# Patient Record
Sex: Male | Born: 1935 | Race: White | Hispanic: No | Smoking: Never smoker
Health system: Southern US, Community
[De-identification: ages and names within clinical notes are randomized; demographics above are authoritative.]

## PROBLEM LIST (undated history)

## (undated) DIAGNOSIS — F0280 Dementia in other diseases classified elsewhere without behavioral disturbance: Secondary | ICD-10-CM

## (undated) DIAGNOSIS — I480 Paroxysmal atrial fibrillation: Secondary | ICD-10-CM

## (undated) DIAGNOSIS — R269 Unspecified abnormalities of gait and mobility: Secondary | ICD-10-CM

## (undated) DIAGNOSIS — E7800 Pure hypercholesterolemia, unspecified: Secondary | ICD-10-CM

## (undated) DIAGNOSIS — I10 Essential (primary) hypertension: Secondary | ICD-10-CM

## (undated) DIAGNOSIS — Z7901 Long term (current) use of anticoagulants: Secondary | ICD-10-CM

## (undated) DIAGNOSIS — K922 Gastrointestinal hemorrhage, unspecified: Secondary | ICD-10-CM

## (undated) DIAGNOSIS — N400 Enlarged prostate without lower urinary tract symptoms: Secondary | ICD-10-CM

## (undated) DIAGNOSIS — F0390 Unspecified dementia without behavioral disturbance: Secondary | ICD-10-CM

## (undated) DIAGNOSIS — R413 Other amnesia: Secondary | ICD-10-CM

## (undated) HISTORY — DX: Pure hypercholesterolemia, unspecified: E78.00

## (undated) HISTORY — DX: Dementia in other diseases classified elsewhere, unspecified severity, without behavioral disturbance, psychotic disturbance, mood disturbance, and anxiety: F02.80

## (undated) HISTORY — PX: TOTAL HIP ARTHROPLASTY: SHX124

## (undated) HISTORY — DX: Unspecified dementia, unspecified severity, without behavioral disturbance, psychotic disturbance, mood disturbance, and anxiety: F03.90

## (undated) HISTORY — DX: Unspecified abnormalities of gait and mobility: R26.9

## (undated) HISTORY — DX: Essential (primary) hypertension: I10

## (undated) HISTORY — PX: CHOLECYSTECTOMY: SHX55

## (undated) HISTORY — DX: Gastrointestinal hemorrhage, unspecified: K92.2

---
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
## 2020-01-16 ENCOUNTER — Encounter: Payer: Self-pay | Admitting: *Deleted

## 2020-01-17 ENCOUNTER — Encounter: Payer: Self-pay | Admitting: Diagnostic Neuroimaging

## 2020-01-17 ENCOUNTER — Ambulatory Visit (INDEPENDENT_AMBULATORY_CARE_PROVIDER_SITE_OTHER): Payer: Medicare Other | Admitting: Diagnostic Neuroimaging

## 2020-01-17 ENCOUNTER — Other Ambulatory Visit: Payer: Self-pay

## 2020-01-17 VITALS — BP 174/92 | HR 75 | Ht 71.0 in | Wt 175.0 lb

## 2020-01-17 DIAGNOSIS — F03B18 Unspecified dementia, moderate, with other behavioral disturbance: Secondary | ICD-10-CM

## 2020-01-17 DIAGNOSIS — F0391 Unspecified dementia with behavioral disturbance: Secondary | ICD-10-CM

## 2020-01-17 NOTE — Patient Instructions (Signed)
MODERATE-SEVERE DEMENTIA - safety / supervision issues reviewed - daily physical activity / exercise (at least 15-30 minutes) - eat more plants / vegetables - increase social activities, brain stimulation, games, puzzles, hobbies, crafts, arts, music - aim for at least 7-8 hours sleep per night (or more) - avoid smoking and alcohol - caregiver resources provided - no driving; cannot handle finances or medications 

## 2020-01-17 NOTE — Progress Notes (Signed)
[facility]  PATIENT: Ryan [name] DOB: 1934-06-05  REFERRING CLINICIAN: [name], MD HISTORY FROM: patient  REASON FOR VISIT: new consult    HISTORICAL  CHIEF COMPLAINT:  Chief Complaint  Patient presents with  . Alzheimer's disease    rm 7 New Pt dgtr- Barbara    HISTORY OF PRESENT ILLNESS:   85 year old male here for evaluation of dementia.  Patient is accompanied by his daughters.  Patient lives in [place] but visits [place] to spend time in the winters with daughters.  He was diagnosed with dementia in 2015.  He got married in 2017.  In 2018 he was assigned a guardian by the state.  Patient has very limited insight.  He is calm and pleasant during our evaluation.  ADLs significantly limited.  Needs help with bathing, dressing, toileting, transferring continence.  He is able to feed himself.  Not able to function independently outside at home.  Cannot manage finances.  Not able to drive.  Cannot manage medications.    REVIEW OF SYSTEMS: Full 14 system review of systems performed and negative with exception of: As per HPI.  ALLERGIES: No Known Allergies  HOME MEDICATIONS: Outpatient Medications Prior to Visit  Medication Sig Dispense Refill  . apixaban (ELIQUIS) 5 MG TABS tablet Take 5 mg by mouth 2 (two) times daily.    Marland Kitchen atorvastatin (LIPITOR) 40 MG tablet Take 40 mg by mouth daily.    . Multiple Vitamin (MULTIVITAMIN) tablet Take 1 tablet by mouth daily.    . tamsulosin (FLOMAX) 0.4 MG CAPS capsule Take 0.4 mg by mouth.    . IRON CR PO Take 90 mg by mouth. (Patient not taking: Reported on 01/17/2020)    . Probiotic Product (PROBIOTIC-10 PO) Take by mouth. (Patient not taking: Reported on 01/17/2020)     No facility-administered medications prior to visit.    PAST MEDICAL HISTORY: Past Medical History:  Diagnosis Date  . Alzheimer disease (HCC)   . BPH (benign prostatic hyperplasia)   . Gait disorder   . Hypercholesterolemia   .  Hypertension   . Memory loss     PAST SURGICAL HISTORY: Past Surgical History:  Procedure Laterality Date  . CHOLECYSTECTOMY    . TOTAL HIP ARTHROPLASTY      FAMILY HISTORY: No family history on file.  SOCIAL HISTORY: Social History   Socioeconomic History  . Marital status: Married    Spouse name: Not on file  . Number of children: 3  . Years of education: Not on file  . Highest education level: High school graduate  Occupational History    Comment: retired  Tobacco Use  . Smoking status: Never Smoker  . Smokeless tobacco: Never Used  . Tobacco comment: maybe in 20's  Substance and Sexual Activity  . Alcohol use: Not Currently  . Drug use: Never  . Sexual activity: Not on file  Other Topics Concern  . Not on file  Social History Narrative   01/17/20 lives with wife in home   Social Determinants of Health   Financial Resource Strain:   . Difficulty of Paying Living Expenses: Not on file  Food Insecurity:   . Worried About Programme researcher, broadcasting/film/video in the Last Year: Not on file  . Ran Out of Food in the Last Year: Not on file  Transportation Needs:   . Lack of Transportation (Medical): Not on file  . Lack of Transportation (Non-Medical): Not on file  Physical Activity:   . Days of  Exercise per Week: Not on file  . Minutes of Exercise per Session: Not on file  Stress:   . Feeling of Stress : Not on file  Social Connections:   . Frequency of Communication with Friends and Family: Not on file  . Frequency of Social Gatherings with Friends and Family: Not on file  . Attends Religious Services: Not on file  . Active Member of Clubs or Organizations: Not on file  . Attends Banker Meetings: Not on file  . Marital Status: Not on file  Intimate Partner Violence:   . Fear of Current or Ex-Partner: Not on file  . Emotionally Abused: Not on file  . Physically Abused: Not on file  . Sexually Abused: Not on file     PHYSICAL EXAM  GENERAL  EXAM/CONSTITUTIONAL: Vitals:  Vitals:   01/17/20 1456  BP: (!) 174/92  Pulse: 75  Weight: 175 lb (79.4 kg)  Height: 5\' 11"  (1.803 m)     Body mass index is 24.41 kg/m. Wt Readings from Last 3 Encounters:  01/17/20 175 lb (79.4 kg)     Patient is in no distress; well developed, nourished and groomed; neck is supple  CARDIOVASCULAR:  Examination of carotid arteries is normal; no carotid bruits  Regular rate and rhythm, no murmurs  Examination of peripheral vascular system by observation and palpation is normal  EYES:  Ophthalmoscopic exam of optic discs and posterior segments is normal; no papilledema or hemorrhages  No exam data present  MUSCULOSKELETAL:  Gait, strength, tone, movements noted in Neurologic exam below  NEUROLOGIC: MENTAL STATUS:  MMSE - Mini Mental State Exam 01/17/2020  Orientation to time 0  Orientation to Place 0  Registration 1  Attention/ Calculation 0  Recall 0  Language- name 2 objects 2  Language- repeat 1  Language- follow 3 step command 3  Language- read & follow direction 1  Write a sentence 0  Copy design 0  Total score 8    awake, alert, oriented to person  DECR memory   DECR attention and concentration  DECR FLUENCY, COMPREHENSION, INSIGHT  fund of knowledge LIMITED   CRANIAL NERVE:   2nd - no papilledema on fundoscopic exam  2nd, 3rd, 4th, 6th - pupils equal and reactive to light, visual fields full to confrontation, extraocular muscles intact, no nystagmus  5th - facial sensation symmetric  7th - facial strength symmetric  8th - hearing intact  9th - palate elevates symmetrically, uvula midline  11th - shoulder shrug symmetric  12th - tongue protrusion midline  MOTOR:   normal bulk and tone, full strength in the BUE, BLE  SENSORY:   normal and symmetric to light touch, temperature, vibration  COORDINATION:   finger-nose-finger, fine finger movements normal  REFLEXES:   deep tendon reflexes  TRACE and symmetric  GAIT/STATION:   narrow based gait; USING WALKER     DIAGNOSTIC DATA (LABS, IMAGING, TESTING) - I reviewed patient records, labs, notes, testing and imaging myself where available.  No results found for: WBC, HGB, HCT, MCV, PLT No results found for: NA, K, CL, CO2, GLUCOSE, BUN, CREATININE, CALCIUM, PROT, ALBUMIN, AST, ALT, ALKPHOS, BILITOT, GFRNONAA, GFRAA No results found for: CHOL, HDL, LDLCALC, LDLDIRECT, TRIG, CHOLHDL No results found for: 01/19/2020 No results found for: VITAMINB12 No results found for: TSH     ASSESSMENT AND PLAN  85 y.o. year old male here with:  Dx:  1. Moderate dementia with behavioral disturbance (HCC)      PLAN:  MODERATE-SEVERE DEMENTIA (since 2015; MMSE 8/30).  - safety / supervision issues reviewed - daily physical activity / exercise (at least 15-30 minutes) - eat more plants / vegetables - increase social activities, brain stimulation, games, puzzles, hobbies, crafts, arts, music - aim for at least 7-8 hours sleep per night (or more) - avoid smoking and alcohol - caregiver resources provided - no driving; cannot handle finances or medications  Return for return to PCP, pending if symptoms worsen or fail to improve.    Suanne Marker, MD 01/17/2020, 3:19 PM Certified in Neurology, Neurophysiology and Neuroimaging  [facility] [address], [address] [place], [place] [address] [phone]

## 2020-01-30 ENCOUNTER — Inpatient Hospital Stay (HOSPITAL_COMMUNITY)
Admission: EM | Admit: 2020-01-30 | Discharge: 2020-02-06 | DRG: 378 | Disposition: A | Discharge status: Home / Self Care | Payer: Medicare Other | Attending: Internal Medicine | Admitting: Internal Medicine

## 2020-01-30 ENCOUNTER — Other Ambulatory Visit: Payer: Self-pay

## 2020-01-30 ENCOUNTER — Encounter (HOSPITAL_COMMUNITY): Payer: Self-pay

## 2020-01-30 DIAGNOSIS — G309 Alzheimer's disease, unspecified: Secondary | ICD-10-CM | POA: Diagnosis present

## 2020-01-30 DIAGNOSIS — K922 Gastrointestinal hemorrhage, unspecified: Secondary | ICD-10-CM

## 2020-01-30 DIAGNOSIS — K2970 Gastritis, unspecified, without bleeding: Secondary | ICD-10-CM | POA: Diagnosis present

## 2020-01-30 DIAGNOSIS — E876 Hypokalemia: Secondary | ICD-10-CM | POA: Diagnosis present

## 2020-01-30 DIAGNOSIS — N400 Enlarged prostate without lower urinary tract symptoms: Secondary | ICD-10-CM | POA: Diagnosis present

## 2020-01-30 DIAGNOSIS — Z20822 Contact with and (suspected) exposure to covid-19: Secondary | ICD-10-CM | POA: Diagnosis present

## 2020-01-30 DIAGNOSIS — K449 Diaphragmatic hernia without obstruction or gangrene: Secondary | ICD-10-CM | POA: Diagnosis present

## 2020-01-30 DIAGNOSIS — Z79899 Other long term (current) drug therapy: Secondary | ICD-10-CM

## 2020-01-30 DIAGNOSIS — K641 Second degree hemorrhoids: Secondary | ICD-10-CM | POA: Diagnosis present

## 2020-01-30 DIAGNOSIS — D500 Iron deficiency anemia secondary to blood loss (chronic): Secondary | ICD-10-CM | POA: Diagnosis not present

## 2020-01-30 DIAGNOSIS — I10 Essential (primary) hypertension: Secondary | ICD-10-CM | POA: Diagnosis present

## 2020-01-30 DIAGNOSIS — R1110 Vomiting, unspecified: Secondary | ICD-10-CM

## 2020-01-30 DIAGNOSIS — K2100 Gastro-esophageal reflux disease with esophagitis, without bleeding: Secondary | ICD-10-CM | POA: Diagnosis present

## 2020-01-30 DIAGNOSIS — D62 Acute posthemorrhagic anemia: Secondary | ICD-10-CM | POA: Diagnosis not present

## 2020-01-30 DIAGNOSIS — R159 Full incontinence of feces: Secondary | ICD-10-CM | POA: Diagnosis present

## 2020-01-30 DIAGNOSIS — I480 Paroxysmal atrial fibrillation: Secondary | ICD-10-CM | POA: Diagnosis present

## 2020-01-30 DIAGNOSIS — Z8673 Personal history of transient ischemic attack (TIA), and cerebral infarction without residual deficits: Secondary | ICD-10-CM

## 2020-01-30 DIAGNOSIS — F0280 Dementia in other diseases classified elsewhere without behavioral disturbance: Secondary | ICD-10-CM | POA: Diagnosis present

## 2020-01-30 DIAGNOSIS — Z7901 Long term (current) use of anticoagulants: Secondary | ICD-10-CM

## 2020-01-30 DIAGNOSIS — D696 Thrombocytopenia, unspecified: Secondary | ICD-10-CM | POA: Diagnosis present

## 2020-01-30 DIAGNOSIS — R32 Unspecified urinary incontinence: Secondary | ICD-10-CM | POA: Diagnosis present

## 2020-01-30 DIAGNOSIS — K5731 Diverticulosis of large intestine without perforation or abscess with bleeding: Secondary | ICD-10-CM | POA: Diagnosis not present

## 2020-01-30 DIAGNOSIS — E782 Mixed hyperlipidemia: Secondary | ICD-10-CM | POA: Diagnosis present

## 2020-01-30 DIAGNOSIS — K222 Esophageal obstruction: Secondary | ICD-10-CM | POA: Diagnosis present

## 2020-01-30 DIAGNOSIS — K224 Dyskinesia of esophagus: Secondary | ICD-10-CM | POA: Diagnosis present

## 2020-01-30 DIAGNOSIS — K4090 Unilateral inguinal hernia, without obstruction or gangrene, not specified as recurrent: Secondary | ICD-10-CM | POA: Diagnosis present

## 2020-01-30 DIAGNOSIS — R142 Eructation: Secondary | ICD-10-CM

## 2020-01-30 DIAGNOSIS — I482 Chronic atrial fibrillation, unspecified: Secondary | ICD-10-CM | POA: Diagnosis present

## 2020-01-30 HISTORY — DX: Long term (current) use of anticoagulants: Z79.01

## 2020-01-30 HISTORY — DX: Benign prostatic hyperplasia without lower urinary tract symptoms: N40.0

## 2020-01-30 HISTORY — DX: Paroxysmal atrial fibrillation: I48.0

## 2020-01-30 HISTORY — DX: Mixed hyperlipidemia: E78.2

## 2020-01-30 LAB — COMPREHENSIVE METABOLIC PANEL
ALT: 22 U/L (ref 0–44)
AST: 21 U/L (ref 15–41)
Albumin: 3.7 g/dL (ref 3.5–5.0)
Alkaline Phosphatase: 64 U/L (ref 38–126)
Anion gap: 10 (ref 5–15)
BUN: 27 mg/dL — ABNORMAL HIGH (ref 8–23)
CO2: 25 mmol/L (ref 22–32)
Calcium: 9 mg/dL (ref 8.9–10.3)
Chloride: 105 mmol/L (ref 98–111)
Creatinine, Ser: 1.03 mg/dL (ref 0.61–1.24)
GFR, Estimated: >60 mL/min (ref >60)
Glucose, Bld: 115 mg/dL — ABNORMAL HIGH (ref 70–99)
Potassium: 4 mmol/L (ref 3.5–5.1)
Sodium: 140 mmol/L (ref 135–145)
Total Bilirubin: 0.7 mg/dL (ref 0.3–1.2)
Total Protein: 6.5 g/dL (ref 6.5–8.1)

## 2020-01-30 LAB — CBC WITH DIFFERENTIAL/PLATELET
Abs Immature Granulocytes: 0.03 10*3/uL (ref 0.00–0.07)
Basophils Absolute: 0 10*3/uL (ref 0.0–0.1)
Basophils Relative: 1 %
Eosinophils Absolute: 0.1 10*3/uL (ref 0.0–0.5)
Eosinophils Relative: 1 %
HCT: 33.2 % — ABNORMAL LOW (ref 39.0–52.0)
Hemoglobin: 10.3 g/dL — ABNORMAL LOW (ref 13.0–17.0)
Immature Granulocytes: 0 %
Lymphocytes Relative: 13 %
Lymphs Abs: 1 10*3/uL (ref 0.7–4.0)
MCH: 25.5 pg — ABNORMAL LOW (ref 26.0–34.0)
MCHC: 31 g/dL (ref 30.0–36.0)
MCV: 82.2 fL (ref 80.0–100.0)
Monocytes Absolute: 0.7 10*3/uL (ref 0.1–1.0)
Monocytes Relative: 9 %
Neutro Abs: 5.9 10*3/uL (ref 1.7–7.7)
Neutrophils Relative %: 76 %
Platelets: 184 10*3/uL (ref 150–400)
RBC: 4.04 MIL/uL — ABNORMAL LOW (ref 4.22–5.81)
RDW: 15.4 % (ref 11.5–15.5)
WBC: 7.7 10*3/uL (ref 4.0–10.5)
nRBC: 0 % (ref 0.0–0.2)

## 2020-01-30 LAB — PROTIME-INR
INR: 1.3 — ABNORMAL HIGH (ref 0.8–1.2)
Prothrombin Time: 16 seconds — ABNORMAL HIGH (ref 11.4–15.2)

## 2020-01-30 LAB — RESP PANEL BY RT-PCR (FLU A&B, COVID) ARPGX2
Influenza A by PCR: NEGATIVE
Influenza B by PCR: NEGATIVE
SARS Coronavirus 2 by RT PCR: NEGATIVE

## 2020-01-30 LAB — TYPE AND SCREEN
ABO/RH(D): O POS
Antibody Screen: NEGATIVE

## 2020-01-30 LAB — ABO/RH: ABO/RH(D): O POS

## 2020-01-30 MED ORDER — ONDANSETRON HCL 4 MG PO TABS
4.0000 mg | ORAL_TABLET | Freq: Four times a day (QID) | ORAL | Status: DC | PRN
  Administered 2020-01-31 – 2020-02-04 (×2): 4 mg via ORAL
  Filled 2020-01-30: qty 1

## 2020-01-30 MED ORDER — POLYETHYLENE GLYCOL 3350 17 G PO PACK: 17.0000 g | PACK | Freq: Every day | ORAL | Status: DC | PRN

## 2020-01-30 MED ORDER — LACTATED RINGERS IV SOLN: INTRAVENOUS | Status: AC

## 2020-01-30 MED ORDER — PANTOPRAZOLE SODIUM 40 MG IV SOLR
40.0000 mg | INTRAVENOUS | Status: AC
  Administered 2020-01-30: 40 mg via INTRAVENOUS
  Filled 2020-01-30: qty 40

## 2020-01-30 MED ORDER — TAMSULOSIN HCL 0.4 MG PO CAPS
0.4000 mg | ORAL_CAPSULE | Freq: Every day | ORAL | Status: DC
  Administered 2020-01-31 – 2020-02-06 (×7): 0.4 mg via ORAL
  Filled 2020-01-30 (×7): qty 1

## 2020-01-30 MED ORDER — ACETAMINOPHEN 650 MG RE SUPP: 650.0000 mg | Freq: Four times a day (QID) | RECTAL | Status: DC | PRN

## 2020-01-30 MED ORDER — FAMOTIDINE IN NACL 20-0.9 MG/50ML-% IV SOLN
20.0000 mg | INTRAVENOUS | Status: AC
  Administered 2020-01-30: 20 mg via INTRAVENOUS
  Filled 2020-01-30: qty 50

## 2020-01-30 MED ORDER — ONDANSETRON HCL 4 MG/2ML IJ SOLN
4.0000 mg | Freq: Four times a day (QID) | INTRAMUSCULAR | Status: DC | PRN
  Administered 2020-02-05 (×2): 4 mg via INTRAVENOUS
  Filled 2020-01-30 (×2): qty 2

## 2020-01-30 MED ORDER — ATORVASTATIN CALCIUM 40 MG PO TABS
40.0000 mg | ORAL_TABLET | Freq: Every day | ORAL | Status: DC
  Administered 2020-01-31 – 2020-02-06 (×7): 40 mg via ORAL
  Filled 2020-01-30 (×7): qty 1

## 2020-01-30 MED ORDER — PANTOPRAZOLE SODIUM 40 MG IV SOLR
40.0000 mg | Freq: Two times a day (BID) | INTRAVENOUS | Status: DC
  Administered 2020-01-31 – 2020-02-04 (×9): 40 mg via INTRAVENOUS
  Filled 2020-01-30 (×10): qty 40

## 2020-01-30 MED ORDER — ACETAMINOPHEN 325 MG PO TABS
650.0000 mg | ORAL_TABLET | Freq: Four times a day (QID) | ORAL | Status: DC | PRN
  Administered 2020-01-31: 650 mg via ORAL
  Filled 2020-01-30: qty 2

## 2020-01-30 MED ORDER — LACTATED RINGERS IV SOLN: INTRAVENOUS | Status: DC

## 2020-01-30 NOTE — H&P (Addendum)
History and Physical    Ryan [name] LKG:[id number] DOB: 06-15-1934 DOA: 01/30/2020  PCP: [name], MD  Patient coming from: Home   Chief Complaint:  Chief Complaint  Patient presents with  . GI Bleeding    lower     HPI:    85 year-old male with past medical history of atrial fibrillation on Eliquis, hyperlipidemia, benign prostatic hyperplasia, Alzheimer's dementia who presents to [facility] emergency department after daughter noted several bouts of bright red blood per rectum and blood mixed with stool.  Patient unfortunately is unable to provide a history due to advanced dementia.  The history has been obtained from the daughter who is at the bedside.  Of note, patient has only been in the [place] area for approximately 3 weeks and typically receives all of his medical care in [place].  No old medical records are available with exception of a wound care clinic note in care everywhere.  Daughter explains that yesterday evening after the patient had a bowel movement, she noted that he had some bright red and maroon-colored blood on his toilet paper.  This morning, shortly after the patient woke up, he stated that he was tired and went back to sleep.  The daughter explains that this is very uncharacteristic of him.  She proceeded to sleep until the early afternoon but still seemed very lethargic.  Shortly after waking, patient stated he that he needed to have a bowel movement and went to the bathroom.  When she went to check on him, she noted that he had a large amount of maroon and bright-colored blood mixed with stool in his adult diaper.  Several hours later in the afternoon, patient experienced another much larger bout of bloody stool.  With each successive bowel movement, the patient seemed weaker and weaker according to the daughter.  Upon attempting to question the patient, he denies abdominal pain, shortness of breath, chest pain or lightheadedness.  The daughter  then proceeded to call EMS who promptly brought patient into [facility] emergency room for evaluation.  Upon evaluation in the emergency department, patient was found to have a hemoglobin of 10.3.  Physical exam by the emergency department provider revealed that the patient had notable dark blood with clots in his adult diaper on arrival.  Patient was given 40 mg of IV Protonix as well as 20 mg of IV Pepcid.  Patient was then initiated on intravenous fluids and the hospitalist group was then called to assess the patient for mission to the hospital.  Emergency department divider also discussed the case with Dr. Bosie Clos with gastroenterology who stated that GI will consult on the patient in the morning.    Review of Systems:   Review of Systems  Unable to perform ROS: Dementia    Past Medical History:  Diagnosis Date  . Alzheimer disease (HCC)   . Alzheimer's dementia (HCC)   . Benign prostatic hyperplasia 01/30/2020  . BPH (benign prostatic hyperplasia)   . Chronic anticoagulation    On eliquis  . Gait disorder   . Hypercholesterolemia   . Hypertension   . Memory loss   . Mixed hyperlipidemia 01/30/2020  . Paroxysmal atrial fibrillation [facility])     Past Surgical History:  Procedure Laterality Date  . CHOLECYSTECTOMY    . TOTAL HIP ARTHROPLASTY       reports that he has never smoked. He has never used smokeless tobacco. He reports previous alcohol use. He reports that he does not use  drugs.  No Known Allergies  History reviewed. No pertinent family history.   Prior to Admission medications   Medication Sig Start Date End Date Taking? Authorizing Provider  apixaban (ELIQUIS) 5 MG TABS tablet Take 5 mg by mouth 2 (two) times daily.    [provider]  atorvastatin (LIPITOR) 40 MG tablet Take 40 mg by mouth daily.    [provider]  IRON CR PO Take 90 mg by mouth. Patient not taking: Reported on 01/17/2020    [provider]  Multiple  Vitamin (MULTIVITAMIN) tablet Take 1 tablet by mouth daily.    [provider]  Probiotic Product (PROBIOTIC-10 PO) Take by mouth. Patient not taking: Reported on 01/17/2020    [provider]  tamsulosin (FLOMAX) 0.4 MG CAPS capsule Take 0.4 mg by mouth.    [provider]    Physical Exam: Vitals:   01/30/20 1815 01/30/20 1845 01/30/20 2000 01/30/20 2001  BP: 129/82 124/67  (!) 141/86  Pulse: 69 71 72 76  Resp: 14 14 14 16   Temp:      TempSrc:      SpO2: 100% 99% 98% 99%  Weight:      Height:        Constitutional: Acute alert and oriented x 1, no associated distress.   Skin: no rashes, no lesions, notable poor skin turgor Eyes: Pupils are equally reactive to light.  Increased conjunctival pallor noted without evidence of scleral icterus. ENMT: Dry mucous membranes noted.  Posterior pharynx clear of any exudate or lesions.   Neck: normal, supple, no masses, no thyromegaly.  No evidence of jugular venous distension.   Respiratory: clear to auscultation bilaterally, no wheezing, no crackles. Normal respiratory effort. No accessory muscle use.  Cardiovascular: Regular rate and rhythm, no murmurs / rubs / gallops. No extremity edema. 2+ pedal pulses. No carotid bruits.  Chest:   Nontender without crepitus or deformity.   Back:   Nontender without crepitus or deformity. Abdomen: Abdomen is soft and nontender.  No evidence of intra-abdominal masses.  Positive bowel sounds noted in all quadrants.   Musculoskeletal: No joint deformity upper and lower extremities. Good ROM, no contractures. Normal muscle tone.  Neurologic: Patient is only oriented x1 to self.  CN 2-12 grossly intact. Sensation intact.  Patient moving all 4 extremities spontaneously.  Patient is following all commands.  Patient is responsive to verbal stimuli.   Psychiatric: Patient exhibits normal mood with appropriate affect.  Due to patient's advanced dementia, patient currently does not seem to  possess insight as to his current situation.   Labs on Admission: I have personally reviewed following labs and imaging studies -   CBC: Recent Labs  Lab 01/30/20 1745  WBC 7.7  NEUTROABS 5.9  HGB 10.3*  HCT 33.2*  MCV 82.2  PLT 184   Basic Metabolic Panel: Recent Labs  Lab 01/30/20 1745  NA 140  K 4.0  CL 105  CO2 25  GLUCOSE 115*  BUN 27*  CREATININE 1.03  CALCIUM 9.0   GFR: Estimated Creatinine Clearance: 57.9 mL/min (by C-G formula based on SCr of 1.03 mg/dL). Liver Function Tests: Recent Labs  Lab 01/30/20 1745  AST 21  ALT 22  ALKPHOS 64  BILITOT 0.7  PROT 6.5  ALBUMIN 3.7   No results for input(s): LIPASE, AMYLASE in the last 168 hours. No results for input(s): AMMONIA in the last 168 hours. Coagulation Profile: Recent Labs  Lab 01/30/20 1745  INR 1.3*  Cardiac Enzymes: No results for input(s): CKTOTAL, CKMB, CKMBINDEX, TROPONINI in the last 168 hours. BNP (last 3 results) No results for input(s): PROBNP in the last 8760 hours. HbA1C: No results for input(s): HGBA1C in the last 72 hours. CBG: No results for input(s): GLUCAP in the last 168 hours. Lipid Profile: No results for input(s): CHOL, HDL, LDLCALC, TRIG, CHOLHDL, LDLDIRECT in the last 72 hours. Thyroid Function Tests: No results for input(s): TSH, T4TOTAL, FREET4, T3FREE, THYROIDAB in the last 72 hours. Anemia Panel: No results for input(s): VITAMINB12, FOLATE, FERRITIN, TIBC, IRON, RETICCTPCT in the last 72 hours. Urine analysis: No results found for: COLORURINE, APPEARANCEUR, LABSPEC, PHURINE, GLUCOSEU, HGBUR, BILIRUBINUR, KETONESUR, PROTEINUR, UROBILINOGEN, NITRITE, LEUKOCYTESUR  Radiological Exams on Admission - Personally Reviewed: No results found.  EKG: Personally reviewed.  Rhythm is normal sinus rhythm with heart rate of 79 bpm.  No dynamic ST segment changes appreciated.  Assessment/Plan Principal Problem:   Anemia due to acute blood loss   Patient exhibiting  multiple episodes of moderate to large amounts of blood mixed with stool with evidence of gross bleeding on examination in the emergency department  Due to bright red to maroon color of observed blood, lower GI bleeding is suspected although upper GI bleeding cannot yet be ruled out.  Patient made n.p.o.  Patient placed on Protonix 40 mg IV every 12  Home regimen of Eliquis which patient takes for his atrial fibrillation has been held.  Hydrating patient with intravenous isotonic fluids  Cycling hemoglobin and hematocrt every 6 hours  Will transfuse patient if hemoglobin drops below 7.  Patient will need endoscopic work-up -Case is already been discussed with Dr. Bosie Clos with gastroenterology by the emergency department staff who stated that he will consult on the patient in the morning.   Active Problems:  Paroxysmal atrial fibrillation   Patient is currently rate controlled and in normal sinus rhythm and is currently not on any AV nodal blocking agents  Monitor patient on telemetry  Holding home regimen of Eliquis    Mixed hyperlipidemia   Continue home regimen of statin therapy    Benign prostatic hyperplasia   Continue home regimen of Flomax  Goals of care   Per my discussion with all 3 daughters (one in person, 2 via phone) they wish the patient to proceed with admission to the hospital and endoscopic evaluation  Regarding CODE STATUS, the daughters feel that the patient wishes to be full code however they will have to review the patient's advanced directive  Apparently, there has recently been a custody battle between the patient's wife in [place] and 3 daughters.  To resolve this issue, the patient has been appointed a guardian by the name of Ryan [name], has been a lifelong friend of the patient.  The daughter is seen we do not know the phone number of this appointed guardian.  I have asked them to provide a phone number to assess soon as possible as legally  would have to get consent from legal guardian for blood transfusion or endoscopic work-up.  Code Status:  Full code Family Communication: Case has been discussed at length with 1 daughter at the bedside and 2 daughters via phone conversation.  Status is: Observation  The patient remains OBS appropriate and will d/c before 2 midnights.  Dispo: The patient is from: Home              Anticipated d/c is to: Home  Anticipated d/c date is: 2 days              Patient currently is not medically stable to d/c.        Marinda Elk MD [facility] Pager [phone]  If 7PM-7AM, please contact night-coverage [email or web address] Use universal [place] password for that web site. If you do not have the password, please call the hospital operator.  01/30/2020, 8:35 PM

## 2020-01-30 NOTE — ED Notes (Signed)
Called CM to come talk to pt's daughter regarding legal Guardian and temporary legal guardian paperwork

## 2020-01-30 NOTE — ED Notes (Signed)
RN aware of pt elevated VS

## 2020-01-30 NOTE — ED Notes (Signed)
Catalina Antigua (Legal Guardian) email: [email or web address]

## 2020-01-30 NOTE — ED Provider Notes (Signed)
[facility] EMERGENCY DEPARTMENT Provider Note   CSN: [id number] Arrival date & time: 01/30/20  1710     History Chief Complaint  Patient presents with  . GI Bleeding    lower    Ryan [name] is a 85 y.o. male.  HPI   This patient is an 85 year old male with a history of dementia, he comes into the hospital today after being found to have some blood in his stools over the last couple of days, today was much worse than yesterday.  There was voluminous amounts.  The patient does not have any complaints, he has dementia, level 5 caveat applies.  I cannot find any prior records in the hospital system regarding GI bleeds however the patient does take Eliquis.  It is not clear why the patient takes Eliquis as there is nothing in the medical history to suggest prior stroke or TIA or atrial fibrillation or blood clot.  Past Medical History:  Diagnosis Date  . Alzheimer disease (HCC)   . BPH (benign prostatic hyperplasia)   . Gait disorder   . Hypercholesterolemia   . Hypertension   . Memory loss     There are no problems to display for this patient.   Past Surgical History:  Procedure Laterality Date  . CHOLECYSTECTOMY    . TOTAL HIP ARTHROPLASTY         History reviewed. No pertinent family history.  Social History   Tobacco Use  . Smoking status: Never Smoker  . Smokeless tobacco: Never Used  . Tobacco comment: maybe in 20's  Substance Use Topics  . Alcohol use: Not Currently  . Drug use: Never    Home Medications Prior to Admission medications   Medication Sig Start Date End Date Taking? Authorizing Provider  apixaban (ELIQUIS) 5 MG TABS tablet Take 5 mg by mouth 2 (two) times daily.    [provider]  atorvastatin (LIPITOR) 40 MG tablet Take 40 mg by mouth daily.    [provider]  IRON CR PO Take 90 mg by mouth. Patient not taking: Reported on 01/17/2020    [provider]  Multiple Vitamin (MULTIVITAMIN)  tablet Take 1 tablet by mouth daily.    [provider]  Probiotic Product (PROBIOTIC-10 PO) Take by mouth. Patient not taking: Reported on 01/17/2020    [provider]  tamsulosin (FLOMAX) 0.4 MG CAPS capsule Take 0.4 mg by mouth.    [provider]    Allergies    Patient has no known allergies.  Review of Systems   Review of Systems  Unable to perform ROS: Dementia    Physical Exam Updated Vital Signs BP 124/67   Pulse 71   Temp 97.7 F (36.5 C) (Oral)   Resp 14   Ht 1.803 m (5\' 11" )   Wt 79.4 kg   SpO2 99%   BMI 24.41 kg/m   Physical Exam Vitals and nursing note reviewed.  Constitutional:      General: He is not in acute distress.    Appearance: He is well-developed.  HENT:     Head: Normocephalic and atraumatic.     Mouth/Throat:     Pharynx: No oropharyngeal exudate.  Eyes:     General: No scleral icterus.       Right eye: No discharge.        Left eye: No discharge.     Conjunctiva/sclera: Conjunctivae normal.     Pupils: Pupils are equal, round, and reactive to light.  Neck:     Thyroid: No thyromegaly.     Vascular: No JVD.  Cardiovascular:     Rate and Rhythm: Normal rate and regular rhythm.     Heart sounds: Normal heart sounds. No murmur heard.  No friction rub. No gallop.   Pulmonary:     Effort: Pulmonary effort is normal. No respiratory distress.     Breath sounds: Normal breath sounds. No wheezing or rales.  Abdominal:     General: There is no distension.     Palpations: Abdomen is soft. There is no mass.     Tenderness: There is no abdominal tenderness.     Comments: Increased bowel sounds  Genitourinary:    Comments: Dark red blood in rectal vault - and filling diaper Musculoskeletal:        General: No tenderness. Normal range of motion.     Cervical back: Normal range of motion and neck supple.  Lymphadenopathy:     Cervical: No cervical adenopathy.  Skin:    General: Skin is warm and dry.     Findings:  No erythema or rash.  Neurological:     Mental Status: He is alert.     Coordination: Coordination normal.  Psychiatric:        Behavior: Behavior normal.     ED Results / Procedures / Treatments   Labs (all labs ordered are listed, but only abnormal results are displayed) Labs Reviewed  CBC WITH DIFFERENTIAL/PLATELET - Abnormal; Notable for the following components:      Result Value   RBC 4.04 (*)    Hemoglobin 10.3 (*)    HCT 33.2 (*)    MCH 25.5 (*)    All other components within normal limits  COMPREHENSIVE METABOLIC PANEL - Abnormal; Notable for the following components:   Glucose, Bld 115 (*)    BUN 27 (*)    All other components within normal limits  PROTIME-INR - Abnormal; Notable for the following components:   Prothrombin Time 16.0 (*)    INR 1.3 (*)    All other components within normal limits  RESP PANEL BY RT-PCR (FLU A&B, COVID) [id number]  TYPE AND SCREEN  ABO/RH    EKG EKG Interpretation  Date/Time:  Monday January 30 2020 17:14:07 EST Ventricular Rate:  79 PR Interval:    QRS Duration: 109 QT Interval:  426 QTC Calculation: 489 R Axis:   7 Text Interpretation: Sinus rhythm Abnormal R-wave progression, early transition Minimal ST depression, inferior leads Borderline prolonged QT interval No old tracing to compare Confirmed by Eber Hong ([id number]) on 01/30/2020 5:16:16 PM   Radiology No results found.  Procedures Procedures (including critical care time)  Medications Ordered in ED Medications  pantoprazole (PROTONIX) injection 40 mg (40 mg Intravenous Given 01/30/20 1752)  famotidine (PEPCID) IVPB 20 mg premix ( Intravenous Stopped 01/30/20 1818)    ED Course  I have reviewed the triage vital signs and the nursing notes.  Pertinent labs & imaging results that were available during my care of the patient were reviewed by me and considered in my medical decision making (see chart for details).    MDM Rules/Calculators/A&P                           This patient has gastrointestinal bleeding, it is dark red blood, this is likely diverticulosis but could be other sources, there is no GI history on this patient, will hold Eliquis, check hemoglobin admit to  the hospital.  He is hemodynamically stable with a blood pressure of 143 systolic pulse of 82 and afebrile.  He has no abdominal tenderness palpation.  Pt has anemia, Creatinine and BUN without significant abnormalities -  D/w hospitalist who will admit GI paged awaiting call back.  Final Clinical Impression(s) / ED Diagnoses Final diagnoses:  Lower GI bleed  Blood loss anemia      Eber Hong, MD 01/30/20 [phone]

## 2020-01-30 NOTE — Care Management (Signed)
ED RNCM/CSW met with patient and daughter in rm82, Patient's daughter reports pt having a legal guardian in [place] and he will be traveling tomorrow, provided contact information for legal guardian  Robyn Haber [phone] [email or web address]

## 2020-01-30 NOTE — ED Triage Notes (Signed)
Pt arrived to ED via [facility] from home w/ c/o lower GI bleeding. Per EMS pt's daughter reports 2 episodes of bright red blood in stool. The first episode was reported as a small amount and the second episode was "enough to saturate and adult diaper". Pt's daughter reported to EMS that pt is on Eliquis, but doesn't know why. Pt has hx of dementia and is A&Ox2 (self, place)

## 2020-01-30 NOTE — Progress Notes (Signed)
HOSPITAL MEDICINE OVERNIGHT EVENT NOTE    I had the opportunity to speak to the court appointed guardian Catalina Antigua [phone]) and discussed the patient's condition as well as the overall plan of care with him.  I discussed the possibility of the patient needing a blood transfusion if the hemoglobin continues to drop below the threshold of 7.  After explained the risks and benefits Mr. Harrietta Guardian has approved.  We have additionally discussed the possible need for endoscopic work-up including upper and lower endoscopy.  I explained the risks and benefits.  He is also given his verbal approval of proceeding with this procedure as well.  He has informed me that the morning of 11/30 he will be traveling from 9 AM until the early afternoon and may be unavailable.  Marinda Elk  MD [facility]

## 2020-01-31 ENCOUNTER — Other Ambulatory Visit: Payer: Self-pay

## 2020-01-31 ENCOUNTER — Observation Stay (HOSPITAL_COMMUNITY): Payer: Medicare Other

## 2020-01-31 DIAGNOSIS — K922 Gastrointestinal hemorrhage, unspecified: Secondary | ICD-10-CM | POA: Diagnosis not present

## 2020-01-31 DIAGNOSIS — D500 Iron deficiency anemia secondary to blood loss (chronic): Secondary | ICD-10-CM | POA: Diagnosis present

## 2020-01-31 DIAGNOSIS — R159 Full incontinence of feces: Secondary | ICD-10-CM | POA: Diagnosis present

## 2020-01-31 DIAGNOSIS — R142 Eructation: Secondary | ICD-10-CM | POA: Diagnosis not present

## 2020-01-31 DIAGNOSIS — K209 Esophagitis, unspecified without bleeding: Secondary | ICD-10-CM | POA: Diagnosis not present

## 2020-01-31 DIAGNOSIS — D62 Acute posthemorrhagic anemia: Secondary | ICD-10-CM

## 2020-01-31 DIAGNOSIS — K2100 Gastro-esophageal reflux disease with esophagitis, without bleeding: Secondary | ICD-10-CM | POA: Diagnosis present

## 2020-01-31 DIAGNOSIS — E876 Hypokalemia: Secondary | ICD-10-CM | POA: Diagnosis present

## 2020-01-31 DIAGNOSIS — I10 Essential (primary) hypertension: Secondary | ICD-10-CM | POA: Diagnosis present

## 2020-01-31 DIAGNOSIS — K449 Diaphragmatic hernia without obstruction or gangrene: Secondary | ICD-10-CM | POA: Diagnosis present

## 2020-01-31 DIAGNOSIS — K2970 Gastritis, unspecified, without bleeding: Secondary | ICD-10-CM | POA: Diagnosis present

## 2020-01-31 DIAGNOSIS — K625 Hemorrhage of anus and rectum: Secondary | ICD-10-CM | POA: Diagnosis not present

## 2020-01-31 DIAGNOSIS — K921 Melena: Secondary | ICD-10-CM | POA: Diagnosis not present

## 2020-01-31 DIAGNOSIS — G308 Other Alzheimer's disease: Secondary | ICD-10-CM | POA: Diagnosis not present

## 2020-01-31 DIAGNOSIS — K641 Second degree hemorrhoids: Secondary | ICD-10-CM | POA: Diagnosis present

## 2020-01-31 DIAGNOSIS — K4090 Unilateral inguinal hernia, without obstruction or gangrene, not specified as recurrent: Secondary | ICD-10-CM | POA: Diagnosis present

## 2020-01-31 DIAGNOSIS — I480 Paroxysmal atrial fibrillation: Secondary | ICD-10-CM | POA: Diagnosis present

## 2020-01-31 DIAGNOSIS — Z8673 Personal history of transient ischemic attack (TIA), and cerebral infarction without residual deficits: Secondary | ICD-10-CM | POA: Diagnosis not present

## 2020-01-31 DIAGNOSIS — D696 Thrombocytopenia, unspecified: Secondary | ICD-10-CM | POA: Diagnosis present

## 2020-01-31 DIAGNOSIS — Z79899 Other long term (current) drug therapy: Secondary | ICD-10-CM | POA: Diagnosis not present

## 2020-01-31 DIAGNOSIS — K219 Gastro-esophageal reflux disease without esophagitis: Secondary | ICD-10-CM | POA: Diagnosis not present

## 2020-01-31 DIAGNOSIS — K222 Esophageal obstruction: Secondary | ICD-10-CM | POA: Diagnosis present

## 2020-01-31 DIAGNOSIS — Z7901 Long term (current) use of anticoagulants: Secondary | ICD-10-CM | POA: Diagnosis not present

## 2020-01-31 DIAGNOSIS — N400 Enlarged prostate without lower urinary tract symptoms: Secondary | ICD-10-CM | POA: Diagnosis present

## 2020-01-31 DIAGNOSIS — R1110 Vomiting, unspecified: Secondary | ICD-10-CM | POA: Diagnosis not present

## 2020-01-31 DIAGNOSIS — K224 Dyskinesia of esophagus: Secondary | ICD-10-CM | POA: Diagnosis present

## 2020-01-31 DIAGNOSIS — Z20822 Contact with and (suspected) exposure to covid-19: Secondary | ICD-10-CM | POA: Diagnosis present

## 2020-01-31 DIAGNOSIS — R32 Unspecified urinary incontinence: Secondary | ICD-10-CM | POA: Diagnosis present

## 2020-01-31 DIAGNOSIS — G309 Alzheimer's disease, unspecified: Secondary | ICD-10-CM | POA: Diagnosis present

## 2020-01-31 DIAGNOSIS — Z515 Encounter for palliative care: Secondary | ICD-10-CM | POA: Diagnosis not present

## 2020-01-31 DIAGNOSIS — E782 Mixed hyperlipidemia: Secondary | ICD-10-CM | POA: Diagnosis present

## 2020-01-31 DIAGNOSIS — K5731 Diverticulosis of large intestine without perforation or abscess with bleeding: Secondary | ICD-10-CM | POA: Diagnosis present

## 2020-01-31 DIAGNOSIS — F0280 Dementia in other diseases classified elsewhere without behavioral disturbance: Secondary | ICD-10-CM | POA: Diagnosis present

## 2020-01-31 DIAGNOSIS — R933 Abnormal findings on diagnostic imaging of other parts of digestive tract: Secondary | ICD-10-CM | POA: Diagnosis not present

## 2020-01-31 LAB — COMPREHENSIVE METABOLIC PANEL
ALT: 19 U/L (ref 0–44)
AST: 21 U/L (ref 15–41)
Albumin: 3.3 g/dL — ABNORMAL LOW (ref 3.5–5.0)
Alkaline Phosphatase: 62 U/L (ref 38–126)
Anion gap: 16 — ABNORMAL HIGH (ref 5–15)
BUN: 26 mg/dL — ABNORMAL HIGH (ref 8–23)
CO2: 17 mmol/L — ABNORMAL LOW (ref 22–32)
Calcium: 8.5 mg/dL — ABNORMAL LOW (ref 8.9–10.3)
Chloride: 107 mmol/L (ref 98–111)
Creatinine, Ser: 0.94 mg/dL (ref 0.61–1.24)
GFR, Estimated: >60 mL/min (ref >60)
Glucose, Bld: 96 mg/dL (ref 70–99)
Potassium: 3.5 mmol/L (ref 3.5–5.1)
Sodium: 140 mmol/L (ref 135–145)
Total Bilirubin: 1 mg/dL (ref 0.3–1.2)
Total Protein: 6 g/dL — ABNORMAL LOW (ref 6.5–8.1)

## 2020-01-31 LAB — CBC WITH DIFFERENTIAL/PLATELET
Abs Immature Granulocytes: 0.03 10*3/uL (ref 0.00–0.07)
Basophils Absolute: 0 10*3/uL (ref 0.0–0.1)
Basophils Relative: 1 %
Eosinophils Absolute: 0.1 10*3/uL (ref 0.0–0.5)
Eosinophils Relative: 2 %
HCT: 32.2 % — ABNORMAL LOW (ref 39.0–52.0)
Hemoglobin: 9.9 g/dL — ABNORMAL LOW (ref 13.0–17.0)
Immature Granulocytes: 1 %
Lymphocytes Relative: 21 %
Lymphs Abs: 1.4 10*3/uL (ref 0.7–4.0)
MCH: 25.6 pg — ABNORMAL LOW (ref 26.0–34.0)
MCHC: 30.7 g/dL (ref 30.0–36.0)
MCV: 83.4 fL (ref 80.0–100.0)
Monocytes Absolute: 0.7 10*3/uL (ref 0.1–1.0)
Monocytes Relative: 10 %
Neutro Abs: 4.4 10*3/uL (ref 1.7–7.7)
Neutrophils Relative %: 65 %
Platelets: 160 10*3/uL (ref 150–400)
RBC: 3.86 MIL/uL — ABNORMAL LOW (ref 4.22–5.81)
RDW: 15.6 % — ABNORMAL HIGH (ref 11.5–15.5)
WBC: 6.6 10*3/uL (ref 4.0–10.5)
nRBC: 0 % (ref 0.0–0.2)

## 2020-01-31 LAB — MAGNESIUM: Magnesium: 2 mg/dL (ref 1.7–2.4)

## 2020-01-31 LAB — HEMOGLOBIN AND HEMATOCRIT, BLOOD
HCT: 29.8 % — ABNORMAL LOW (ref 39.0–52.0)
HCT: 30.6 % — ABNORMAL LOW (ref 39.0–52.0)
Hemoglobin: 9.3 g/dL — ABNORMAL LOW (ref 13.0–17.0)
Hemoglobin: 9.4 g/dL — ABNORMAL LOW (ref 13.0–17.0)

## 2020-01-31 IMAGING — CT CT CTA ABD/PEL W/CM AND/OR W/O CM
3 of 10 series · 10 of 46 positions shown, 16 images · IV contrast (Omni 300)
Comparison: None.

CLINICAL DATA: Acute rectal bleeding since yesterday.

EXAM:
CT ANGIOGRAPHY ABDOMEN AND PELVIS WITH CONTRAST AND WITHOUT CONTRAST
TECHNIQUE: Multidetector CT imaging of the abdomen and pelvis was performed
using the standard protocol during bolus administration of
intravenous contrast. Multiplanar reconstructed images and MIPs were
obtained and reviewed to evaluate the vascular anatomy.
CONTRAST:  100mL OMNIPAQUE IOHEXOL 350 MG/ML SOLN

[Series 6: arterial 3.0 · axial · arterial · 0.74mm/px · z∈[-589,-511]mm · 2 of 159 slices shown]
[im 14/159  soft-tissue]
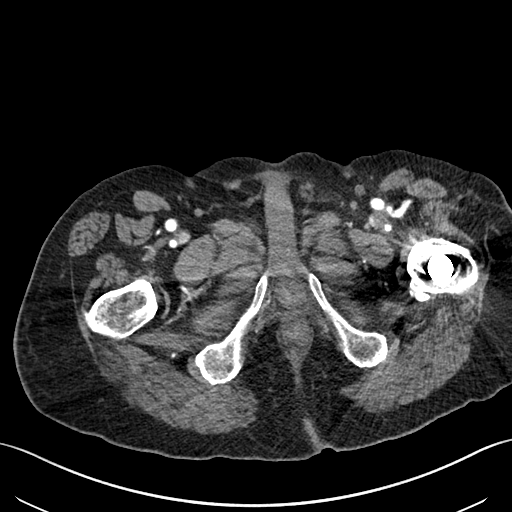
[im 40/159  soft-tissue]
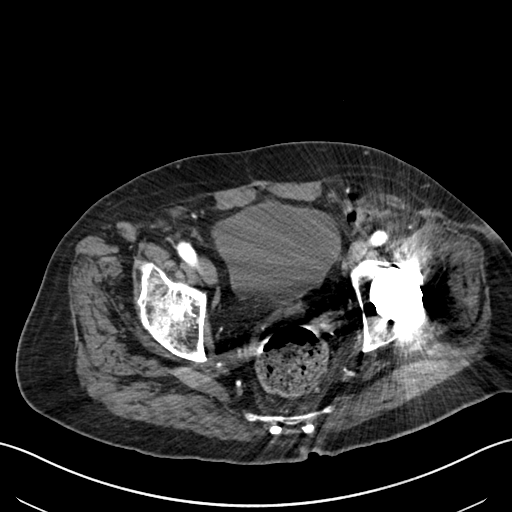

[Series 8: arterial cor · coronal · arterial · 0.68mm/px · 2 of 139 slices shown, 3 images]
[im 47/139  soft-tissue]
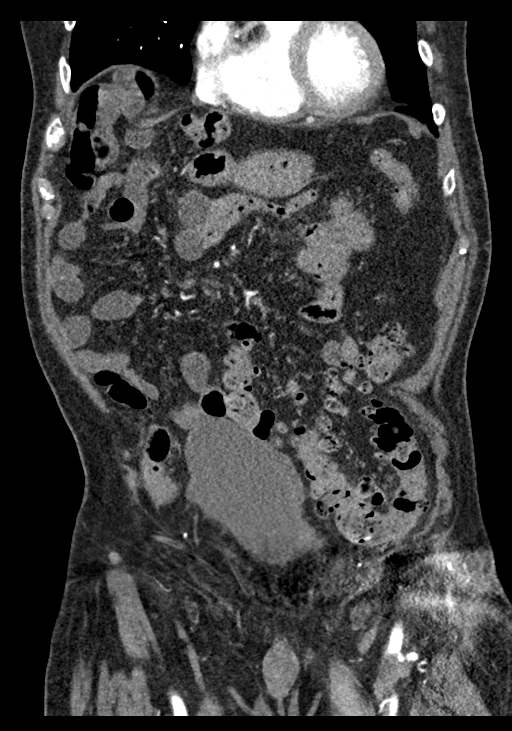
[im 47/139  bone]
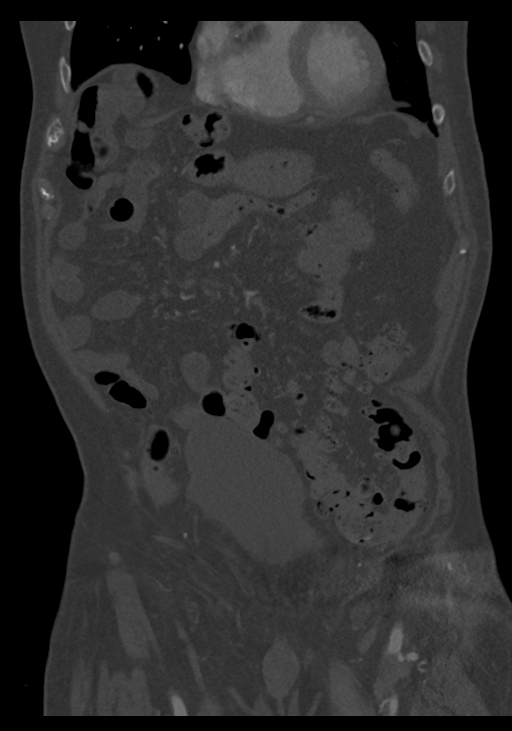
[im 93/139  soft-tissue]
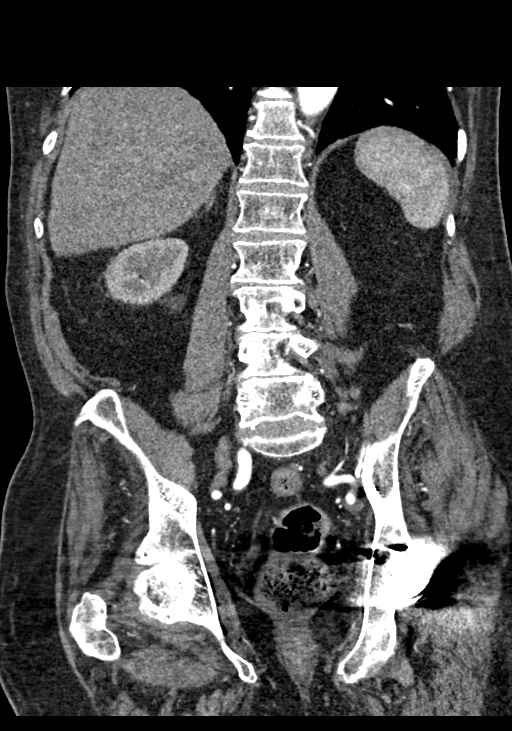

[Series 12: portal venous · axial · portal-venous · 0.74mm/px · z∈[-559,-224]mm · 6 of 95 slices shown, 11 images]
[im 14/95  soft-tissue]
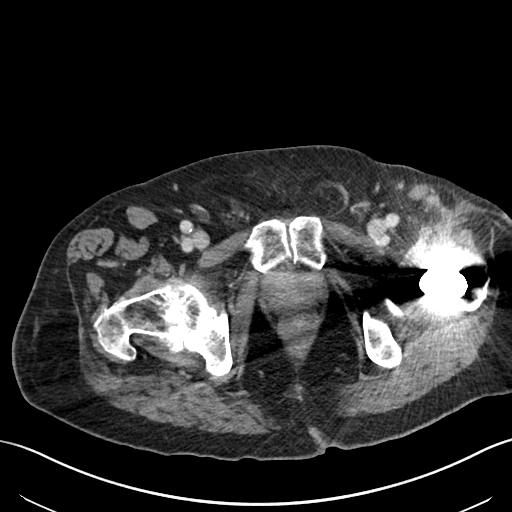
[im 14/95  bone]
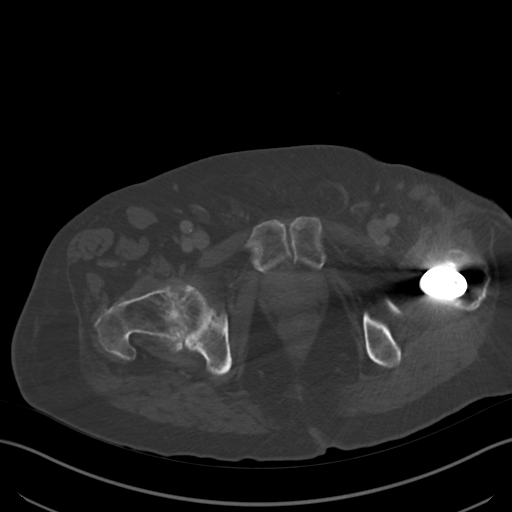
[im 27/95  soft-tissue]
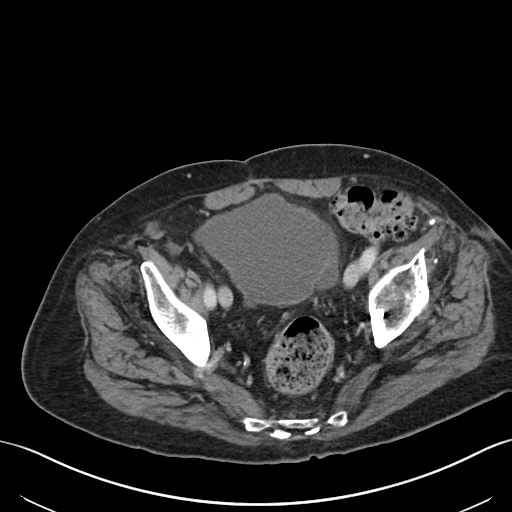
[im 41/95  soft-tissue]
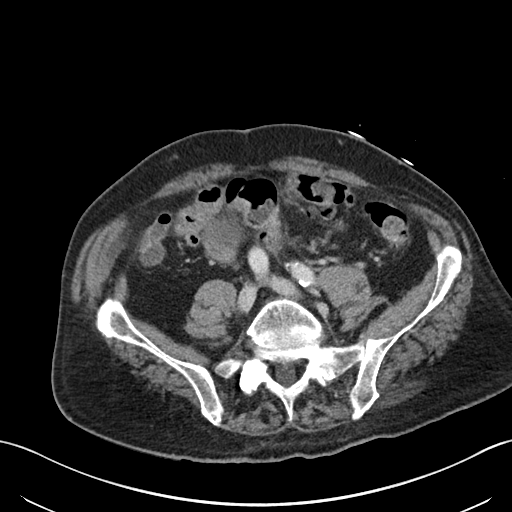
[im 41/95  lung]
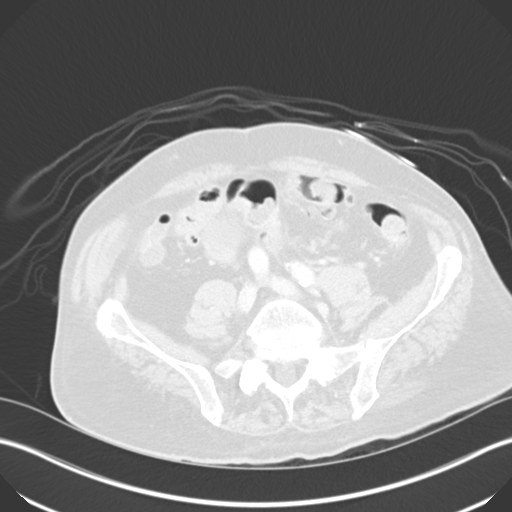
[im 54/95  soft-tissue]
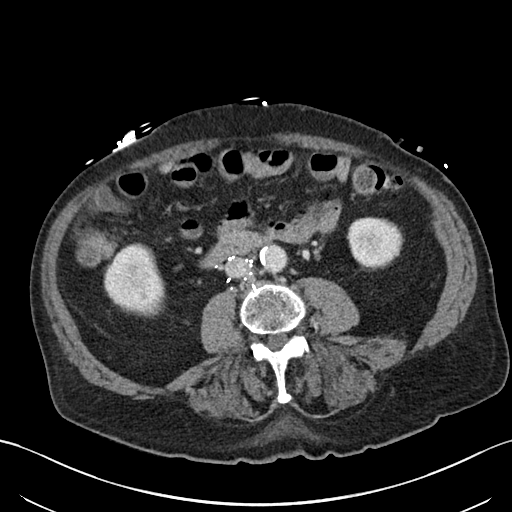
[im 54/95  lung]
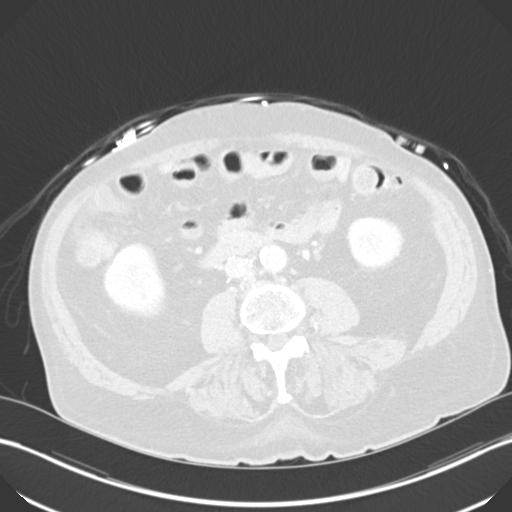
[im 68/95  soft-tissue]
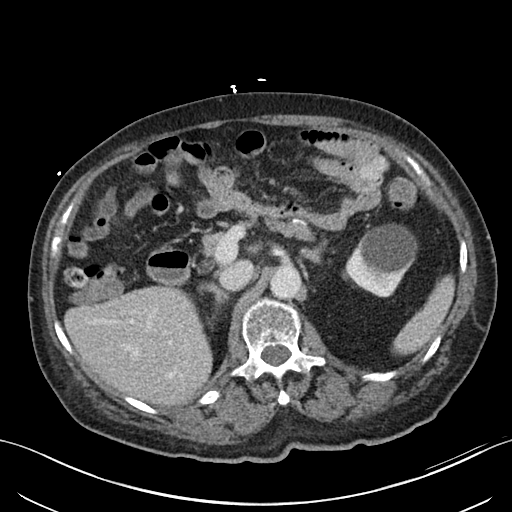
[im 68/95  lung]
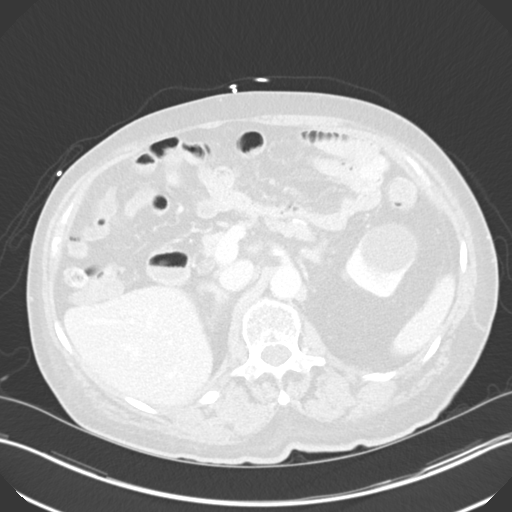
[im 81/95  soft-tissue]
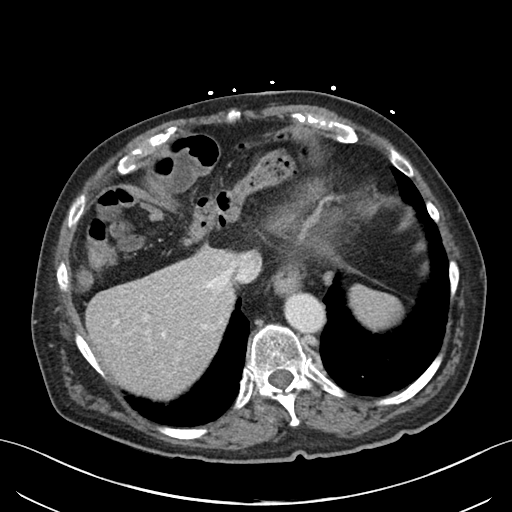
[im 81/95  lung]
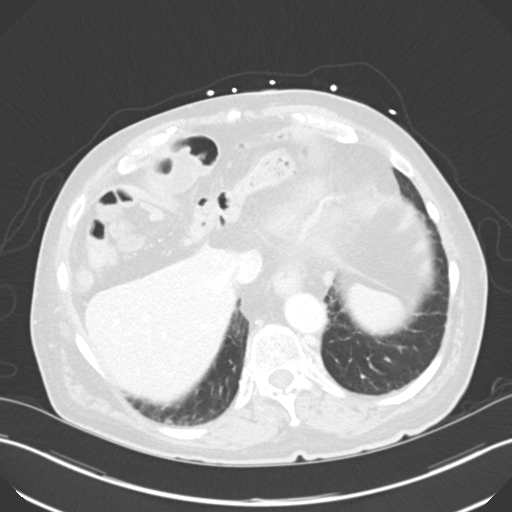

[10 of 46 positions shown; findings below may reference images not displayed]

FINDINGS: VASCULAR

Aorta: Normally patent with no evidence of aneurysm or dissection.
Mild calcified plaque in the distal aorta.

Celiac: Normally patent origin. Atherosclerosis with associated mild
aneurysmal dilatation of a tortuous celiac trunk prior to its
bifurcation. Maximal diameter is approximately 10 mm. Distal branch
vessels are normally patent.

SMA: Normally patent.

Renals: Bilateral single renal arteries demonstrate normal patency.

IMA: Normally patent.

Inflow: Bilateral iliac arteries demonstrates mild atherosclerosis
without aneurysm or significant obstructive disease.

Proximal Outflow: Normally patent common femoral arteries and
femoral bifurcations.

Veins: Venous phase imaging demonstrates normal patency of
visualized venous structures including mesenteric veins, the splenic
vein, the portal vein, the IVC, iliac veins, common femoral veins
and the renal veins. An IVC filter is in place in the infrarenal IVC
and demonstrates no evidence of internal thrombus. There is some
penetration some of the distal IVC filter legs posteriorly.

Review of the MIP images confirms the above findings.

NON-VASCULAR

Lower chest: No acute abnormality.  Probable small hiatal hernia.

Hepatobiliary: No focal liver abnormality is seen. Status post
cholecystectomy. No biliary dilatation.

Pancreas: Unremarkable. No pancreatic ductal dilatation or
surrounding inflammatory changes.

Spleen: No splenic injury or perisplenic hematoma.

Adrenals/Urinary Tract: No adrenal masses. Bilateral simple renal
cysts. No hydronephrosis or visualized renal calculi. The bladder
demonstrates trabeculated appearance and multiple probable small
diverticula.

Stomach/Bowel: Bowel shows no evidence obstruction, ileus,
inflammation or lesion. Diverticulosis noted involving much of the
colon without acute diverticulitis. During arterial and venous
phases of imaging, there is no evidence of active contrast
extravasation into the gastrointestinal tract.

Lymphatic: No enlarged abdominal or pelvic lymph nodes.

Reproductive: Prostate is unremarkable.

Other: Small left inguinal hernia contains fat. No abdominopelvic
ascites.

Musculoskeletal: No acute or significant osseous findings.
IMPRESSION: 1. No evidence of active contrast extravasation into the
gastrointestinal tract on arterial and venous phases of imaging.
2. Diverticulosis of the colon without evidence of acute
diverticulitis.
3. Mild aneurysmal dilatation of a tortuous celiac trunk measuring
10 mm in diameter.
4. IVC filter in place in the infrarenal IVC with no evidence of
internal thrombus. There is some penetration of some of the distal
IVC filter legs posteriorly.
5. Trabeculated appearance of the bladder with multiple small
diverticula.
6. Small left inguinal hernia containing fat.

Aortic Atherosclerosis ([IT]-[IT]).

## 2020-01-31 MED ORDER — IOHEXOL 350 MG/ML SOLN
100.0000 mL | Freq: Once | INTRAVENOUS | Status: AC | PRN
  Administered 2020-01-31: 100 mL via INTRAVENOUS

## 2020-01-31 NOTE — Progress Notes (Addendum)
PROGRESS NOTE    Ryan [name]  YCX:[id number] DOB: 16-Nov-1934 DOA: 01/30/2020 PCP: [name], MD   Brief Narrative:  Patient is 85 year old male with past medical history of A. fib on Eliquis, hyperlipidemia, BPH, Alzheimer's dementia presents to emergency department with bright red blood per rectum.  Upon arrival to ED: Patient was found to have hemoglobin of 10.3.  EDP revealed that patient had notable dark blood with clots in his adult diaper on arrival.  Patient was given 40 mg of IV Protonix as well as 20 mg of IV Pepcid.  Patient was then initiated on IV fluids.  GI consulted.  Patient admitted for further evaluation and management of acute blood loss anemia secondary to lower GI bleed.  Assessment & Plan:   Acute blood loss anemia/symptomatic anemia: -Likely in the setting of acute lower GI bleed -Patient presented with bright red blood per rectum. -Hemoglobin dropped from 10.3-9.9. -Patient started on IV PPI twice daily. -Evaluated by GI recommended CTA abdomen to rule out mass lesion or active bleeding given patient underlying advanced dementia-thought he would not be able to complete/tolerate prep at this time. -Monitor H&H closely and transfuse if hemoglobin less than 7. -Continue to hold Eliquis. -Monitor vitals closely.  Paroxysmal A. fib: Rate controlled. -Continue to hold Eliquis.  Not on AV nodal blocking agents at home.  Continue to monitor heart rate closely on telemetry. -Consulted cardiology for their recommendation.  Hyperlipidemia: Continue statin  BPH: Continue Flomax  Advanced Alzheimer's dementia: Without behavioral changes -Supportive care.  Left thigh/wound: Wound care consulted-appreciate input.  Goals of care: Patient is from [place] and currently living with his daughters who is taking care of him.  Apparently there has recently been a custody battle between the patient's wife and 3 daughters.  To resolve this issue patient has been appointed a  guardian by the name of Yafet Cline.  On admission-[name] was contacted by admitting physician who agreed with blood transfusion and endoscopic intervention if necessary.  DVT prophylaxis: SCD Code Status: Full code Family Communication: Patient's daughter present at bedside.  Plan of care discussed with patient in length and he verbalized understanding and agreed with it. Disposition Plan: Home in 1 to 2 days   consultants:   GI  Procedures:   CT abdomen  Antimicrobials:   None  Status is: Observation  Dispo: The patient is from: Home              Anticipated d/c is to: Home              Anticipated d/c date is: 1 day              Patient currently is not medically stable to d/c.   Subjective: Patient seen and examined.  Daughter at bedside.  Reports overall he feels weak and lethargic.  Denies chest pain, shortness of breath, palpitation, leg swelling, epigastric pain, nausea or vomiting.   Objective: Vitals:   01/31/20 0630 01/31/20 0715 01/31/20 0930 01/31/20 1015  BP: (!) 149/91 (!) 153/87 (!) 146/84 139/76  Pulse: 76 71 71 (!) 59  Resp: 17 15 14 14   Temp:      TempSrc:      SpO2: 98% 99% 96% 97%  Weight:      Height:        Intake/Output Summary (Last 24 hours) at 01/31/2020 1139 Last data filed at 01/31/2020 0839 Gross per 24 hour  Intake 30 ml  Output 150 ml  Net -120  ml   Filed Weights   01/30/20 1715  Weight: 79.4 kg    Examination:  General exam: Appears calm and comfortable, on room air, communicating well, appears weak Respiratory system: Clear to auscultation. Respiratory effort normal. Cardiovascular system: S1 & S2 heard, RRR. No JVD, murmurs, rubs, gallops or clicks. No pedal edema. Gastrointestinal system: Abdomen is nondistended, soft and nontender. No organomegaly or masses felt. Normal bowel sounds heard. Central nervous system: Alert and oriented. No focal neurological deficits. Extremities: Symmetric 5 x 5  power. Skin: No rashes, lesions or ulcers   Data Reviewed: I have personally reviewed following labs and imaging studies  CBC: Recent Labs  Lab 01/30/20 1745 01/31/20 0046 01/31/20 0500  WBC 7.7  --  6.6  NEUTROABS 5.9  --  4.4  HGB 10.3* 9.3* 9.9*  HCT 33.2* 29.8* 32.2*  MCV 82.2  --  83.4  PLT 184  --  160   Basic Metabolic Panel: Recent Labs  Lab 01/30/20 1745 01/31/20 0500  NA 140 140  K 4.0 3.5  CL 105 107  CO2 25 17*  GLUCOSE 115* 96  BUN 27* 26*  CREATININE 1.03 0.94  CALCIUM 9.0 8.5*  MG  --  2.0   GFR: Estimated Creatinine Clearance: 63.4 mL/min (by C-G formula based on SCr of 0.94 mg/dL). Liver Function Tests: Recent Labs  Lab 01/30/20 1745 01/31/20 0500  AST 21 21  ALT 22 19  ALKPHOS 64 62  BILITOT 0.7 1.0  PROT 6.5 6.0*  ALBUMIN 3.7 3.3*   No results for input(s): LIPASE, AMYLASE in the last 168 hours. No results for input(s): AMMONIA in the last 168 hours. Coagulation Profile: Recent Labs  Lab 01/30/20 1745  INR 1.3*   Cardiac Enzymes: No results for input(s): CKTOTAL, CKMB, CKMBINDEX, TROPONINI in the last 168 hours. BNP (last 3 results) No results for input(s): PROBNP in the last 8760 hours. HbA1C: No results for input(s): HGBA1C in the last 72 hours. CBG: No results for input(s): GLUCAP in the last 168 hours. Lipid Profile: No results for input(s): CHOL, HDL, LDLCALC, TRIG, CHOLHDL, LDLDIRECT in the last 72 hours. Thyroid Function Tests: No results for input(s): TSH, T4TOTAL, FREET4, T3FREE, THYROIDAB in the last 72 hours. Anemia Panel: No results for input(s): VITAMINB12, FOLATE, FERRITIN, TIBC, IRON, RETICCTPCT in the last 72 hours. Sepsis Labs: No results for input(s): PROCALCITON, LATICACIDVEN in the last 168 hours.  Recent Results (from the past 240 hour(s))  Resp Panel by RT-PCR (Flu A&B, Covid) Nasopharyngeal Swab     Status: None   Collection Time: 01/30/20  5:45 PM   Specimen: Nasopharyngeal Swab; Nasopharyngeal(NP)  swabs in vial transport medium  Result Value Ref Range Status   SARS Coronavirus 2 by RT PCR NEGATIVE NEGATIVE Final    Comment: (NOTE) SARS-CoV-2 target nucleic acids are NOT DETECTED.  The SARS-CoV-2 RNA is generally detectable in upper respiratory specimens during the acute phase of infection. The lowest concentration of SARS-CoV-2 viral copies this assay can detect is 138 copies/mL. A negative result does not preclude SARS-Cov-2 infection and should not be used as the sole basis for treatment or other patient management decisions. A negative result may occur with  improper specimen collection/handling, submission of specimen other than nasopharyngeal swab, presence of viral mutation(s) within the areas targeted by this assay, and inadequate number of viral copies(<138 copies/mL). A negative result must be combined with clinical observations, patient history, and epidemiological information. The expected result is Negative.  Fact Sheet for Patients:  BloggerCourse.com  Fact Sheet for Healthcare Providers:  SeriousBroker.it  This test is no t yet approved or cleared by the [place] FDA and  has been authorized for detection and/or diagnosis of SARS-CoV-2 by FDA under an Emergency Use Authorization (EUA). This EUA will remain  in effect (meaning this test can be used) for the duration of the COVID-19 declaration under Section 564(b)(1) of the Act, 21 U.S.C.section 360bbb-3(b)(1), unless the authorization is terminated  or revoked sooner.       Influenza A by PCR NEGATIVE NEGATIVE Final   Influenza B by PCR NEGATIVE NEGATIVE Final    Comment: (NOTE) The Xpert Xpress SARS-CoV-2/FLU/RSV plus assay is intended as an aid in the diagnosis of influenza from Nasopharyngeal swab specimens and should not be used as a sole basis for treatment. Nasal washings and aspirates are unacceptable for Xpert Xpress  SARS-CoV-2/FLU/RSV testing.  Fact Sheet for Patients: BloggerCourse.com  Fact Sheet for Healthcare Providers: SeriousBroker.it  This test is not yet approved or cleared by the [place] FDA and has been authorized for detection and/or diagnosis of SARS-CoV-2 by FDA under an Emergency Use Authorization (EUA). This EUA will remain in effect (meaning this test can be used) for the duration of the COVID-19 declaration under Section 564(b)(1) of the Act, 21 U.S.C. section 360bbb-3(b)(1), unless the authorization is terminated or revoked.  Performed at [facility], [address], [place], [place] [address]       Radiology Studies: No results found.  Scheduled Meds: . atorvastatin  40 mg Oral Daily  . pantoprazole (PROTONIX) IV  40 mg Intravenous Q12H  . tamsulosin  0.4 mg Oral Daily   Continuous Infusions: . lactated ringers 100 mL/hr at 01/31/20 0244     LOS: 0 days   Time spent: 40 minutes   Alberto Schoch Estill Cotta, MD [facility]  If 7PM-7AM, please contact night-coverage [email or web address] 01/31/2020, 11:39 AM

## 2020-01-31 NOTE — ED Notes (Signed)
Lunch Tray Ordered @ 1028. 

## 2020-01-31 NOTE — Consult Note (Signed)
Cardiology Consultation:   Patient ID: Ryan [name] MRN: [id number]; DOB: Jul 14, 1934  Admit date: 01/30/2020 Date of Consult: 01/31/2020  Primary Care Provider: [name], MD [facility] Cardiologist: [name] Electrophysiologist:  None    Patient Profile:   Ryan [name] is a 85 y.o. male with a hx of afib on Eliquis, HLD, benign prostatic hyperplasia, Alzheimer's dementia, history of PE s/p IVC with who is being seen today for the evaluation of anticoagulation in the setting of GIB at the request of Ryan [name].  History of Present Illness:   Ryan [name] unable to provide history due to advanced dementia. History is provided by his daughter. She reported the patient recently moved to [name] about 3 weeks ago. He moved from [place] where his wife was taking care of him however level of care was questionable so patient has a guardian now. The daughters are trying to move him to [name]. They have seen neurology with [facility] physicians but not cardiology. According to prior records he has a history of Afib on Eliquis, hypertension not on medications, and hyperlipidemia. No know history of stroke, CHF, DM2, PVD/CAD, MI, or coronary stenting. No tobacco or drug history. Occasionally drinks alcohol.   The patient presented to the ER 01/30/20 for BRBPR. Daughter noted the evening before he had a bowel movement that had bright red blood. The following morning the patient felt tired and was lethargic. He had 2 more BMs that had BRB. EMS was called who brought the patient to the ER. In the ER the patient was found to have Hgb 10.3. He was noted to have dark blood with clots in his adult diaper. He was given IV protonix and IV pepcid. Vitals were normal. EGK showed NSR with  Nonspecific changes. Labs showed BUN 26, creatinine 0.94   Past Medical History:  Diagnosis Date  . Alzheimer disease (HCC)   . Alzheimer's dementia (HCC)   . Benign prostatic hyperplasia 01/30/2020  . BPH  (benign prostatic hyperplasia)   . Chronic anticoagulation    On eliquis  . Gait disorder   . Hypercholesterolemia   . Hypertension   . Memory loss   . Mixed hyperlipidemia 01/30/2020  . Paroxysmal atrial fibrillation [name])     Past Surgical History:  Procedure Laterality Date  . CHOLECYSTECTOMY    . TOTAL HIP ARTHROPLASTY       Home Medications:  Prior to Admission medications   Medication Sig Start Date End Date Taking? Authorizing Provider  apixaban (ELIQUIS) 5 MG TABS tablet Take 5 mg by mouth 2 (two) times daily.   Yes [provider]  atorvastatin (LIPITOR) 40 MG tablet Take 40 mg by mouth daily.   Yes [provider]  Multiple Vitamin (MULTIVITAMIN) tablet Take 1 tablet by mouth daily.   Yes [provider]  tamsulosin (FLOMAX) 0.4 MG CAPS capsule Take 0.4 mg by mouth daily after supper.    Yes [provider]    Inpatient Medications: Scheduled Meds: . atorvastatin  40 mg Oral Daily  . pantoprazole (PROTONIX) IV  40 mg Intravenous Q12H  . tamsulosin  0.4 mg Oral Daily   Continuous Infusions: . lactated ringers 100 mL/hr at 01/31/20 0244   PRN Meds: acetaminophen **OR** acetaminophen, ondansetron **OR** ondansetron (ZOFRAN) IV, polyethylene glycol  Allergies:   No Known Allergies  Social History:   Social History   Socioeconomic History  . Marital status: Married    Spouse name: Not on file  . Number of children: 3  .  Years of education: Not on file  . Highest education level: High school graduate  Occupational History    Comment: retired  Tobacco Use  . Smoking status: Never Smoker  . Smokeless tobacco: Never Used  . Tobacco comment: maybe in 20's  Substance and Sexual Activity  . Alcohol use: Not Currently  . Drug use: Never  . Sexual activity: Not on file  Other Topics Concern  . Not on file  Social History Narrative   01/17/20 lives with wife in home   Social Determinants of Health   Financial Resource  Strain:   . Difficulty of Paying Living Expenses: Not on file  Food Insecurity:   . Worried About Programme researcher, broadcasting/film/videounning Out of Food in the Last Year: Not on file  . Ran Out of Food in the Last Year: Not on file  Transportation Needs:   . Lack of Transportation (Medical): Not on file  . Lack of Transportation (Non-Medical): Not on file  Physical Activity:   . Days of Exercise per Week: Not on file  . Minutes of Exercise per Session: Not on file  Stress:   . Feeling of Stress : Not on file  Social Connections:   . Frequency of Communication with Friends and Family: Not on file  . Frequency of Social Gatherings with Friends and Family: Not on file  . Attends Religious Services: Not on file  . Active Member of Clubs or Organizations: Not on file  . Attends BankerClub or Organization Meetings: Not on file  . Marital Status: Not on file  Intimate Partner Violence:   . Fear of Current or Ex-Partner: Not on file  . Emotionally Abused: Not on file  . Physically Abused: Not on file  . Sexually Abused: Not on file    Family History:   History reviewed. No pertinent family history.   ROS:  Please see the history of present illness.  All other ROS reviewed and negative.     Physical Exam/Data:   Vitals:   01/31/20 1100 01/31/20 1200 01/31/20 1245 01/31/20 1438  BP: (!) 145/86 (!) 142/96 (!) 141/81 (!) 147/79  Pulse: 66 73 68 77  Resp: 17 15 13 15   Temp:    97.8 F (36.6 C)  TempSrc:    Oral  SpO2: 98% 100% 96% 100%  Weight:      Height:        Intake/Output Summary (Last 24 hours) at 01/31/2020 1523 Last data filed at 01/31/2020 0839 Gross per 24 hour  Intake 30 ml  Output 150 ml  Net -120 ml   Last 3 Weights 01/30/2020 01/17/2020  Weight (lbs) 175 lb 0.7 oz 175 lb  Weight (kg) 79.4 kg 79.379 kg     Body mass index is 24.41 kg/m.  General:  Well nourished, well developed, in no acute distress HEENT: normal Lymph: no adenopathy Neck: no JVD Endocrine:  No thryomegaly Vascular: No  carotid bruits; FA pulses 2+ bilaterally without bruits  Cardiac:  normal S1, S2; RRR; no murmur  Lungs:  clear to auscultation bilaterally, no wheezing, rhonchi or rales  Abd: soft, nontender, no hepatomegaly  Ext: no edema Musculoskeletal:  No deformities, BUE and BLE strength normal and equal Skin: warm and dry  Neuro:  CNs 2-12 intact, no focal abnormalities noted Psych:  Normal affect   EKG:  The EKG was personally reviewed and demonstrates:  SR, 79 bpm, QTC 489 ms, nonspecific ST/T wave changes Telemetry:  Telemetry was personally reviewed and demonstrates:  NSR, PVCs,  PACs, HR 70s  Relevant CV Studies:  None.   Laboratory Data:  High Sensitivity Troponin:  No results for input(s): TROPONINIHS in the last 720 hours.   Chemistry Recent Labs  Lab 01/30/20 1745 01/31/20 0500  NA 140 140  K 4.0 3.5  CL 105 107  CO2 25 17*  GLUCOSE 115* 96  BUN 27* 26*  CREATININE 1.03 0.94  CALCIUM 9.0 8.5*  GFRNONAA >60 >60  ANIONGAP 10 16*    Recent Labs  Lab 01/30/20 1745 01/31/20 0500  PROT 6.5 6.0*  ALBUMIN 3.7 3.3*  AST 21 21  ALT 22 19  ALKPHOS 64 62  BILITOT 0.7 1.0   Hematology Recent Labs  Lab 01/30/20 1745 01/30/20 1745 01/31/20 0046 01/31/20 0500 01/31/20 1337  WBC 7.7  --   --  6.6  --   RBC 4.04*  --   --  3.86*  --   HGB 10.3*   < > 9.3* 9.9* 9.4*  HCT 33.2*   < > 29.8* 32.2* 30.6*  MCV 82.2  --   --  83.4  --   MCH 25.5*  --   --  25.6*  --   MCHC 31.0  --   --  30.7  --   RDW 15.4  --   --  15.6*  --   PLT 184  --   --  160  --    < > = values in this interval not displayed.   BNPNo results for input(s): BNP, PROBNP in the last 168 hours.  DDimer No results for input(s): DDIMER in the last 168 hours.   Radiology/Studies:  CT Angio Abd/Pel w/ and/or w/o  Result Date: 01/31/2020 CLINICAL DATA:  Acute rectal bleeding since yesterday. EXAM: CT ANGIOGRAPHY ABDOMEN AND PELVIS WITH CONTRAST AND WITHOUT CONTRAST TECHNIQUE: Multidetector CT imaging  of the abdomen and pelvis was performed using the standard protocol during bolus administration of intravenous contrast. Multiplanar reconstructed images and MIPs were obtained and reviewed to evaluate the vascular anatomy. CONTRAST:  OMNIPAQUE IOHEXOL 350 MG/ML SOLN COMPARISON:  None. FINDINGS: VASCULAR Aorta: Normally patent with no evidence of aneurysm or dissection. Mild calcified plaque in the distal aorta. Celiac: Normally patent origin. 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 Maximal diameter is approximately 10 mm. Distal branch vessels are normally patent. SMA: Normally patent. Renals: Bilateral single renal arteries demonstrate normal patency. IMA: Normally patent. Inflow: Bilateral iliac arteries demonstrates mild atherosclerosis without aneurysm or significant obstructive disease. Proximal Outflow: Normally patent common femoral arteries and femoral bifurcations. Veins: Venous phase imaging demonstrates normal patency of visualized venous structures including mesenteric veins, the splenic vein, the portal vein, the IVC, iliac veins, common femoral veins and the renal veins. An IVC filter is in place in the infrarenal IVC and demonstrates no evidence of internal thrombus. There is some penetration some of the distal IVC filter legs posteriorly. Review of the MIP images confirms the above findings. NON-VASCULAR Lower chest: No acute abnormality.  Probable small hiatal hernia. Hepatobiliary: No focal liver abnormality is seen. Status post cholecystectomy. No biliary dilatation. Pancreas: Unremarkable. No pancreatic ductal dilatation or surrounding inflammatory changes. Spleen: No splenic injury or perisplenic hematoma. Adrenals/Urinary Tract: No adrenal masses. Bilateral simple renal cysts. No hydronephrosis or visualized renal calculi. The bladder demonstrates trabeculated appearance and multiple probable small diverticula.  Stomach/Bowel: Bowel shows no evidence obstruction, ileus, inflammation or lesion. Diverticulosis noted involving much of the colon without acute  diverticulitis. During arterial and venous phases of imaging, there is no evidence of active contrast extravasation into the gastrointestinal tract. Lymphatic: No enlarged abdominal or pelvic lymph nodes. Reproductive: Prostate is unremarkable. Other: Small left inguinal hernia contains fat. No abdominopelvic ascites. Musculoskeletal: No acute or significant osseous findings. IMPRESSION: 1. No evidence of active contrast extravasation into the gastrointestinal tract on arterial and venous phases of imaging. 2. Diverticulosis of the colon without evidence of acute diverticulitis. 3. 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 4. IVC filter in place in the infrarenal IVC with no evidence of internal thrombus. There is some penetration of some of the distal IVC filter legs posteriorly. 5. Trabeculated appearance of the bladder with multiple small diverticula. 6. Small left inguinal hernia containing fat. Aortic Atherosclerosis (ICD10-I70.0). Electronically Signed   By: Irish Lack M.D.   On: 01/31/2020 12:13     Assessment and Plan:   Paroxysmal Afib - SR on initial EKG. Tele shows SR with PACs/PVCs - not on rate controlling agents. Consider BB. - Eliquis held for acute GIB - CHADSVASC = 3-4 (htn, agex2, PVD). Patient should be on longterm anticoagulation. Would be helpful for colonoscopy/endoscopy for definitive answer in regards to bleeding. If negative can then consider retrial of Eliquis. Md to see  Acute anemia/GIB - patient presents with multiple episodes of BRBPR - Hgb 10.2 on admission. Today 9.9 - IV PPI  - Eliquis held - GI consulted and due to dementia they felt patient might not tolerate the prep. CTA was ordered to r/o mass or lesion. Imaging showed no acute bleeding, diverticulitis without acute  diverticulitis, IVC filter in the infrarenal IVC with no thrombus.  - GI willing to proceed with procedure if bleeding continues.  HLD - continue statin  HTN - PTA not on meds - pressures mildly elevated - consider BB  For questions or updates, please contact [facility] Please consult [email or web address] for contact info under    Signed, Herby Amick Aaiden Stall, PA-C  01/31/2020 3:23 PM

## 2020-01-31 NOTE — ED Notes (Signed)
Attempted to call report

## 2020-01-31 NOTE — Consult Note (Addendum)
WOC Nurse Consult Note: Reason for Consult: Consult requested for leg wounds.  Daughter at bedside states pt developed wounds of an unknown etiology and has been followed by the outpatient wound care center in [place].  They have perfromed serial debridements and ordered Silvadene for daily wound care.  Wound type: Left upper thigh with 2 chronic full thickness wounds; inner 1X1cm, 100% loose yellow slough, outer .8X.8cm, 80% red, 20% yellow, both with mod amt tan drainage, no odor Dressing procedure/placement/frequency: Topical treatment orders provided for bedside nurses: Change dressing to left upper thigh wounds Q day as follows: Family prefers to bring in topical treatment from home and may change dressings if desired as follows: Cleanse 2 wounds with NS, lightly scrub with gauze. Apply Silvadene and gauze and tape. Pt plans to follow-up with the outpatient wound care center after discharge. Please re-consult if further assistance is needed.  Thank-you,  Cammie Mcgee MSN, RN, CWOCN, [place], CNS [phone]

## 2020-01-31 NOTE — Consult Note (Signed)
Referring Provider: Dr. Eber Hong (ED) Primary Care Physician:  [name], MD Primary Gastroenterologist:  Gentry Fitz  Reason for Consultation:  Rectal bleeding  HPI: [name] is a 85 y.o. male with advanced dementia and A. fib (on Eliquis) presenting for consultation of rectal bleeding.  Patient has advanced dementia and is unable to provide history.  However, patient's daughter is at bedside and provides recent history.  Patient lives in [place] and has a caregiver, though has recently been visiting in [place].  Yesterday, patient started having bright red blood per rectum.  He had 2-3 episodes yesterday.  He has had only one small episode today.  There were a few small hard stool pellets but otherwise patient mostly passed frank blood.  No melena was present.  Patient has intermittent constipation, which typically resolves without any treatment.  Last dose of Eliquis was yesterday.  Patient has not been complaining of any abdominal pain or discomfort.  No episodes of emesis.  Patient's cousin's son died of colon cancer but no other known family history of colon cancer or gastrointestinal malignancy.  Past Medical History:  Diagnosis Date  . Alzheimer disease (HCC)   . Alzheimer's dementia (HCC)   . Benign prostatic hyperplasia 01/30/2020  . BPH (benign prostatic hyperplasia)   . Chronic anticoagulation    On eliquis  . Gait disorder   . Hypercholesterolemia   . Hypertension   . Memory loss   . Mixed hyperlipidemia 01/30/2020  . Paroxysmal atrial fibrillation [facility])     Past Surgical History:  Procedure Laterality Date  . CHOLECYSTECTOMY    . TOTAL HIP ARTHROPLASTY      Prior to Admission medications   Medication Sig Start Date End Date Taking? Authorizing Provider  apixaban (ELIQUIS) 5 MG TABS tablet Take 5 mg by mouth 2 (two) times daily.   Yes [provider]  atorvastatin (LIPITOR) 40 MG tablet Take 40 mg by mouth daily.   Yes [provider]  Multiple Vitamin (MULTIVITAMIN) tablet Take 1 tablet by mouth daily.   Yes [provider]  tamsulosin (FLOMAX) 0.4 MG CAPS capsule Take 0.4 mg by mouth daily after supper.    Yes [provider]    Scheduled Meds: . atorvastatin  40 mg Oral Daily  . pantoprazole (PROTONIX) IV  40 mg Intravenous Q12H  . tamsulosin  0.4 mg Oral Daily   Continuous Infusions: . lactated ringers 100 mL/hr at 01/31/20 0244   PRN Meds:.acetaminophen **OR** acetaminophen, ondansetron **OR** ondansetron (ZOFRAN) IV, polyethylene glycol  Allergies as of 01/30/2020  . (No Known Allergies)    History reviewed. No pertinent family history.  Social History   Socioeconomic History  . Marital status: Married    Spouse name: Not on file  . Number of children: 3  . Years of education: Not on file  . Highest education level: High school graduate  Occupational History    Comment: retired  Tobacco Use  . Smoking status: Never Smoker  . Smokeless tobacco: Never Used  . Tobacco comment: maybe in 20's  Substance and Sexual Activity  . Alcohol use: Not Currently  . Drug use: Never  . Sexual activity: Not on file  Other Topics Concern  . Not on file  Social History Narrative   01/17/20 lives with wife in home   Social Determinants of Health   Financial Resource Strain:   . Difficulty of Paying Living Expenses: Not on file  Food Insecurity:   . Worried About Radiation protection practitioner  of Food in the Last Year: Not on file  . Ran Out of Food in the Last Year: Not on file  Transportation Needs:   . Lack of Transportation (Medical): Not on file  . Lack of Transportation (Non-Medical): Not on file  Physical Activity:   . Days of Exercise per Week: Not on file  . Minutes of Exercise per Session: Not on file  Stress:   . Feeling of Stress : Not on file  Social Connections:   . Frequency of Communication with Friends and Family: Not on file  . Frequency of Social Gatherings with Friends and Family:  Not on file  . Attends Religious Services: Not on file  . Active Member of Clubs or Organizations: Not on file  . Attends Banker Meetings: Not on file  . Marital Status: Not on file  Intimate Partner Violence:   . Fear of Current or Ex-Partner: Not on file  . Emotionally Abused: Not on file  . Physically Abused: Not on file  . Sexually Abused: Not on file    Review of Systems: Review of Systems  Unable to perform ROS: Dementia   Physical Exam: Vital signs: Vitals:   01/31/20 0630 01/31/20 0715  BP: (!) 149/91 (!) 153/87  Pulse: 76 71  Resp: 17 15  Temp:    SpO2: 98% 99%      Physical Exam Vitals reviewed.  Constitutional:      General: He is not in acute distress. HENT:     Head: Normocephalic and atraumatic.     Mouth/Throat:     Mouth: Mucous membranes are moist.     Pharynx: Oropharynx is clear.  Eyes:     General: No scleral icterus.    Extraocular Movements: Extraocular movements intact.     Conjunctiva/sclera: Conjunctivae normal.  Cardiovascular:     Rate and Rhythm: Normal rate and regular rhythm.     Pulses: Normal pulses.  Pulmonary:     Effort: Pulmonary effort is normal. No respiratory distress.     Breath sounds: Normal breath sounds.  Abdominal:     General: Bowel sounds are normal. There is no distension.     Palpations: Abdomen is soft. There is no mass.     Tenderness: There is no abdominal tenderness. There is no guarding or rebound.     Hernia: No hernia is present.  Musculoskeletal:        General: No swelling or tenderness.     Cervical back: Normal range of motion and neck supple.  Skin:    General: Skin is warm and dry.  Neurological:     Mental Status: Mental status is at baseline. He is lethargic and disoriented.  Psychiatric:        Mood and Affect: Mood normal.        Behavior: Behavior normal. Behavior is cooperative.      GI:  Lab Results: Recent Labs    01/30/20 1745 01/31/20 0046 01/31/20 0500  WBC  7.7  --  6.6  HGB 10.3* 9.3* 9.9*  HCT 33.2* 29.8* 32.2*  PLT 184  --  160   BMET Recent Labs    01/30/20 1745 01/31/20 0500  NA 140 140  K 4.0 3.5  CL 105 107  CO2 25 17*  GLUCOSE 115* 96  BUN 27* 26*  CREATININE 1.03 0.94  CALCIUM 9.0 8.5*   LFT Recent Labs    01/31/20 0500  PROT 6.0*  ALBUMIN 3.3*  AST 21  ALT  19  ALKPHOS 62  BILITOT 1.0   PT/INR Recent Labs    01/30/20 1745  LABPROT 16.0*  INR 1.3*     Studies/Results: No results found.  Impression: Rectal bleeding: Most consistent with diverticular bleeding, though patient has not had a recent colonoscopy, so mass lesion cannot be ruled out. -Hemoglobin 9.9 today, 10.3 yesterday.  No baseline on file. -INR 1.3 -Mildly elevated BUN (26), normal creatinine (0.94)  A. fib, on Eliquis, last dose 11/29 (a.m.)  Plan: Due to patient's advanced dementia, it does not appear that he would be able to complete/tolerate prep at this time.  Recommend proceeding with CT-A to rule out any mass lesion or active bleeding.  Continue to monitor H&H with transfusion as needed to maintain hemoglobin greater than 7.  Clear liquid diet.  Hold Eliquis for now, if OK from cardiology/neurology standpoint.  [facility] will follow.   LOS: 0 days   Edrick Kins  PA-C 01/31/2020, 9:29 AM  Contact #  [phone]

## 2020-02-01 ENCOUNTER — Encounter (HOSPITAL_COMMUNITY): Payer: Self-pay | Admitting: Internal Medicine

## 2020-02-01 DIAGNOSIS — D62 Acute posthemorrhagic anemia: Secondary | ICD-10-CM | POA: Diagnosis not present

## 2020-02-01 DIAGNOSIS — Z7901 Long term (current) use of anticoagulants: Secondary | ICD-10-CM | POA: Diagnosis not present

## 2020-02-01 DIAGNOSIS — K222 Esophageal obstruction: Secondary | ICD-10-CM | POA: Diagnosis not present

## 2020-02-01 DIAGNOSIS — K5731 Diverticulosis of large intestine without perforation or abscess with bleeding: Secondary | ICD-10-CM | POA: Diagnosis not present

## 2020-02-01 DIAGNOSIS — K625 Hemorrhage of anus and rectum: Secondary | ICD-10-CM

## 2020-02-01 DIAGNOSIS — R1110 Vomiting, unspecified: Secondary | ICD-10-CM

## 2020-02-01 DIAGNOSIS — R142 Eructation: Secondary | ICD-10-CM

## 2020-02-01 LAB — BASIC METABOLIC PANEL
Anion gap: 10 (ref 5–15)
BUN: 13 mg/dL (ref 8–23)
CO2: 26 mmol/L (ref 22–32)
Calcium: 8.4 mg/dL — ABNORMAL LOW (ref 8.9–10.3)
Chloride: 107 mmol/L (ref 98–111)
Creatinine, Ser: 1.03 mg/dL (ref 0.61–1.24)
GFR, Estimated: >60 mL/min (ref >60)
Glucose, Bld: 95 mg/dL (ref 70–99)
Potassium: 3.3 mmol/L — ABNORMAL LOW (ref 3.5–5.1)
Sodium: 143 mmol/L (ref 135–145)

## 2020-02-01 LAB — CBC
HCT: 27.4 % — ABNORMAL LOW (ref 39.0–52.0)
Hemoglobin: 8.7 g/dL — ABNORMAL LOW (ref 13.0–17.0)
MCH: 25.6 pg — ABNORMAL LOW (ref 26.0–34.0)
MCHC: 31.8 g/dL (ref 30.0–36.0)
MCV: 80.6 fL (ref 80.0–100.0)
Platelets: 145 10*3/uL — ABNORMAL LOW (ref 150–400)
RBC: 3.4 MIL/uL — ABNORMAL LOW (ref 4.22–5.81)
RDW: 15.4 % (ref 11.5–15.5)
WBC: 4.8 10*3/uL (ref 4.0–10.5)
nRBC: 0 % (ref 0.0–0.2)

## 2020-02-01 LAB — LIPID PANEL
Cholesterol: 94 mg/dL (ref 0–200)
HDL: 46 mg/dL (ref >40)
LDL Cholesterol: 40 mg/dL (ref 0–99)
Total CHOL/HDL Ratio: 2 RATIO
Triglycerides: 40 mg/dL (ref <150)
VLDL: 8 mg/dL (ref 0–40)

## 2020-02-01 LAB — MAGNESIUM: Magnesium: 1.9 mg/dL (ref 1.7–2.4)

## 2020-02-01 MED ORDER — MELATONIN 3 MG PO TABS
3.0000 mg | ORAL_TABLET | Freq: Every day | ORAL | Status: DC
  Administered 2020-02-01 – 2020-02-04 (×4): 3 mg via ORAL
  Filled 2020-02-01 (×4): qty 1

## 2020-02-01 MED ORDER — POTASSIUM CHLORIDE 20 MEQ PO PACK
40.0000 meq | PACK | Freq: Once | ORAL | Status: AC
  Administered 2020-02-01: 40 meq via ORAL
  Filled 2020-02-01: qty 2

## 2020-02-01 NOTE — Progress Notes (Signed)
[facility] Gastroenterology Progress Note  [name] 85 y.o. Oct 24, 1934  CC:  Rectal bleeding  Subjective: Patient with advanced dementia, though denies any complaints.  Last BM last night.  This morning, had a small smear.  Per daughter at bedside, bleeding has been decreasing.  Patient's daughter at bedside, states they found a report of colonoscopy from 2012 when patient had rectal bleeding. Colonoscopy revealed diverticulosis and hemorrhoids but was otherwise unremarkable.  ROS : Review of Systems  Unable to perform ROS: Dementia    Objective: Vital signs in last 24 hours: Vitals:   02/01/20 0424 02/01/20 0700  BP:  (!) 153/87  Pulse:  74  Resp:  18  Temp: 97.8 F (36.6 C) 97.8 F (36.6 C)  SpO2:  97%    Physical Exam:  General:  Alert, cooperative, pleasantly confused  Head:  Normocephalic, without obvious abnormality, atraumatic  Eyes:  Mild conjunctival pallor, EOM's intact,   Lungs:   Clear to auscultation bilaterally, respirations unlabored  Heart:  Regular rate and rhythm, S1, S2 normal  Abdomen:   Soft, non-tender, bowel sounds active all four quadrants,  no masses,   Extremities: Extremities normal, atraumatic, no  edema  Pulses: 2+ and symmetric    Lab Results: Recent Labs    01/31/20 0500 02/01/20 0401  NA 140 143  K 3.5 3.3*  CL 107 107  CO2 17* 26  GLUCOSE 96 95  BUN 26* 13  CREATININE 0.94 1.03  CALCIUM 8.5* 8.4*  MG 2.0 1.9   Recent Labs    01/30/20 1745 01/31/20 0500  AST 21 21  ALT 22 19  ALKPHOS 64 62  BILITOT 0.7 1.0  PROT 6.5 6.0*  ALBUMIN 3.7 3.3*   Recent Labs    01/30/20 1745 01/31/20 0046 01/31/20 0500 01/31/20 0500 01/31/20 1337 02/01/20 0401  WBC 7.7  --  6.6  --   --  4.8  NEUTROABS 5.9  --  4.4  --   --   --   HGB 10.3*   < > 9.9*   < > 9.4* 8.7*  HCT 33.2*   < > 32.2*   < > 30.6* 27.4*  MCV 82.2  --  83.4  --   --  80.6  PLT 184  --  160  --   --  145*   < > = values in this interval not displayed.    Recent Labs    01/30/20 1745  LABPROT 16.0*  INR 1.3*    Assessment: Rectal bleeding in the setting of Xarelto use: most consistent with diverticular bleeding. No mass lesion or active bleeding seen on CT-A yesterday. -Hgb 8.7, mildly decreased from 9.4 yesterday, part of which may be dilutional -Colonoscopy from 2012 for rectal bleeding revealed diverticulosis and hemorrhoids but was otherwise unremarkable. -Patient hemodynamically stable with normal heart rate and normotensive to hypertensive BP  A. fib, on Eliquis, last dose 11/29 (a.m.)  Plan: Patient with advanced dementia who is currently hemodynamically stable, CT-A unremarkable (no active bleeding or mass lesion), decreasing bleeding and known history of diverticular bleeding, recommend conservative management.  Colonoscopy could be considered if bleeding persists or if deemed necessary from a cardiac standpoint.  Continue to monitor H&H with transfusion as needed to maintain hemoglobin greater than 7.  Patient's family has requested a second opinion from [facility].  We will be happy to follow the patient moving forward, if so desired.  However, if the patient's family would prefer for [facility] to manage the  patient further, we will sign off.  Edrick Kins PA-C 02/01/2020, 10:09 AM  Contact #  [phone]

## 2020-02-01 NOTE — Consult Note (Signed)
[facility] Consult: 10:49 AM 02/01/2020  LOS: 1 day    Referring Provider: Dr Jacqulyn BathPahwani.    Primary Care Physician:  Merlene LaughterStoneking, Hal, MD Primary Gastroenterologist:  Dr. Ewing SchleinMagod     Reason for Consultation: Family requests second opinion regarding lower GI bleed.   HPI: Ryan [name] is a 85 y.o. male.  PMH Alzheimer's dementia.  Atrial fibrillation, on chronic Eliquis.  Acute lacunar stroke 2015. Episodes of painless hematochezia in 2012 and 2015 when he was in [place].  2012 colonoscopy without polyps did revealed large and small pandiverticulosis. Patient previously lived in [place] but moved to [place] near his daughters, had a stent of 3 months over the summer, return to [place] and within the last month returned back to [place].  3 daughters are providing 24/7 care.  Patient incontinent of bowel and bladder, wears depends.  Has intermittent constipation.  No associated abdominal pain or change in appetite which is good.  On occasions after episodes of constipation, daughters have seen dark, tarry stools. Patient seemed fatigue on Sunday.  That evening daughter saw a tinge of red on the tissue after a bowel movement.  She thought maybe it was from the cranberries that they have been eating.  More fatigued and tired on Monday morning and was in and out of bed but finally woke up around 1230 and had a bowel movement which was blood mixed with some stool and there was the same in his depends that had accumulated overnight.  Monday afternoon he had at least 2 more episodes of hematochezia and was quite weak and faint, less alert.  Daughter called EMS and he arrived at the ED.  Had at least 2 or 3 more episodes of hematochezia Monday evening through Tuesday.  Last bowel movement was smaller amount of blood this  morning.  Hgb 9.9 >> 9.7.  MCV 80.  Platelets 145.  Potassium 3.3. Last dose of Eliquis was 11/29. [facility] ordered a CTAP w angiography: No active GI bleeding.  Colonic diverticulosis.  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  IVC filter in place.  Small left inguinal, fat-containing, hernia.  Urinary bladder diverticula. Dr. Ewing SchleinMagod shows not to pursue colonoscopy, given diminished volume of bleeding over the last few days, and liklihood of this being diverticular bleed.  `He would consider colonoscopy for persistent bleeding or if necessary from a cardiac standpoint.  Cardiology recommended proceeding with colonoscopy, if no contraindication for anticoagulation, cardiology would restart Eliquis. Alternatively may consider stopping Eliquis permanently given advanced age and advanced dementia with palliative care consult for goals of care given that pt would be at higher risk for stroke off Eliquis.  No history of significant alcohol intake. Does not use NSAIDs or aspirin.    Past Medical History:  Diagnosis Date  . Alzheimer disease (HCC)   . Benign prostatic hyperplasia 01/30/2020  . Chronic anticoagulation    On eliquis  . Gait disorder   . Hypercholesterolemia   . Hypertension   . Mixed hyperlipidemia 01/30/2020  . Paroxysmal atrial fibrillation (HCC)  Past Surgical History:  Procedure Laterality Date  . CHOLECYSTECTOMY    . TOTAL HIP ARTHROPLASTY      Prior to Admission medications   Medication Sig Start Date End Date Taking? Authorizing Provider  apixaban (ELIQUIS) 5 MG TABS tablet Take 5 mg by mouth 2 (two) times daily.   Yes [provider]  atorvastatin (LIPITOR) 40 MG tablet Take 40 mg by mouth daily.   Yes [provider]  Multiple Vitamin (MULTIVITAMIN) tablet Take 1 tablet by mouth daily.   Yes [provider]  tamsulosin (FLOMAX) 0.4 MG CAPS capsule Take 0.4 mg by mouth daily after supper.    Yes [provider]    Scheduled Meds: . atorvastatin  40 mg Oral Daily  . pantoprazole (PROTONIX) IV  40 mg Intravenous Q12H  . tamsulosin  0.4 mg Oral Daily   Infusions:  PRN Meds: acetaminophen **OR** acetaminophen, ondansetron **OR** ondansetron (ZOFRAN) IV, polyethylene glycol   Allergies as of 01/30/2020  . (No Known Allergies)    History reviewed. No pertinent family history.  Social History   Socioeconomic History  . Marital status: Married    Spouse name: Not on file  . Number of children: 3  . Years of education: Not on file  . Highest education level: High school graduate  Occupational History    Comment: retired  Tobacco Use  . Smoking status: Never Smoker  . Smokeless tobacco: Never Used  . Tobacco comment: maybe in 20's  Substance and Sexual Activity  . Alcohol use: Not Currently  . Drug use: Never  . Sexual activity: Not on file  Other Topics Concern  . Not on file  Social History Narrative   01/17/20 lives with wife in home   Social Determinants of Health   Financial Resource Strain:   . Difficulty of Paying Living Expenses: Not on file  Food Insecurity:   . Worried About Programme researcher, broadcasting/film/video in the Last Year: Not on file  . Ran Out of Food in the Last Year: Not on file  Transportation Needs:   . Lack of Transportation (Medical): Not on file  . Lack of Transportation (Non-Medical): Not on file  Physical Activity:   . Days of Exercise per Week: Not on file  . Minutes of Exercise per Session: Not on file  Stress:   . Feeling of Stress : Not on file  Social Connections:   . Frequency of Communication with Friends and Family: Not on file  . Frequency of Social Gatherings with Friends and Family: Not on file  . Attends Religious Services: Not on file  . Active Member of Clubs or Organizations: Not on file  . Attends Banker Meetings: Not on file  . Marital Status: Not on file  Intimate Partner Violence:   . Fear of Current or  Ex-Partner: Not on file  . Emotionally Abused: Not on file  . Physically Abused: Not on file  . Sexually Abused: Not on file    REVIEW OF SYSTEMS: Constitutional: Weakness ENT: Notable episodes of epistaxis over the last several months and had been lined up to see an ENT doctor but return to [place] so had to cancel that appointment. Pulm: Shortness of breath CV:  No palpitations, no LE edema.  Chest pain GU:  No hematuria, no frequency GI: See HPI Heme: Develops purpura but no significant bruising. Transfusions: No documented history of previous transfusions Neuro: Weakness.  No seizures.  No headaches, no peripheral tingling or  numbness Derm:  No itching, no rash or sores.  Endocrine:  No sweats or chills.  No polyuria or dysuria Immunization: Not queried. Travel: To and from [place] within the last several weeks as well as earlier this year   PHYSICAL EXAM: Vital signs in last 24 hours: Vitals:   02/01/20 0424 02/01/20 0700  BP:  (!) 153/87  Pulse:  74  Resp:  18  Temp: 97.8 F (36.6 C) 97.8 F (36.6 C)  SpO2:  97%   Wt Readings from Last 3 Encounters:  01/30/20 79.4 kg  01/17/20 79.4 kg    General: Nonacutely ill appearing, comfortable, resting, laconic Head: No facial asymmetry or swelling.  No signs head trauma Eyes: No conjunctival pallor or scleral icterus Ears: No obvious hearing deficit Nose: No congestion or discharge Mouth: Upper full denture in place.  Lower teeth in fair to poor repair with several missing.  Tongue lists to the right.  Mucosa is moist, pink, clear Neck: No JVD, no masses, no thyromegaly Lungs: Clear bilaterally.  No labored breathing.  No cough. Heart: RRR.  Sinus rhythm in the 80s on the telemetry monitor.  No MRG.  S1, S2 present Abdomen: Soft, nonobese.  No HSM, masses, bruits, hernias.  Active bowel sounds.  No tenderness.   Rectal: Deferred Musc/Skeltl: No joint redness, swelling or gross deformity Extremities: No CCE Neurologic:  Lethargic.  Follows simple commands.  Paucity of speech.  Not oriented to place, time. Skin: Tanned Nodes: No cervical adenopathy Psych: Flat affect.  Intake/Output from previous day: 11/30 0701 - 12/01 0700 In: 240 [P.O.:240] Out: 1900 [Urine:1900] Intake/Output this shift: No intake/output data recorded.  LAB RESULTS: Recent Labs    01/30/20 1745 01/31/20 0046 01/31/20 0500 01/31/20 1337 02/01/20 0401  WBC 7.7  --  6.6  --  4.8  HGB 10.3*   < > 9.9* 9.4* 8.7*  HCT 33.2*   < > 32.2* 30.6* 27.4*  PLT 184  --  160  --  145*   < > = values in this interval not displayed.   BMET Lab Results  Component Value Date   NA 143 02/01/2020   NA 140 01/31/2020   NA 140 01/30/2020   K 3.3 (L) 02/01/2020   K 3.5 01/31/2020   K 4.0 01/30/2020   CL 107 02/01/2020   CL 107 01/31/2020   CL 105 01/30/2020   CO2 26 02/01/2020   CO2 17 (L) 01/31/2020   CO2 25 01/30/2020   GLUCOSE 95 02/01/2020   GLUCOSE 96 01/31/2020   GLUCOSE 115 (H) 01/30/2020   BUN 13 02/01/2020   BUN 26 (H) 01/31/2020   BUN 27 (H) 01/30/2020   CREATININE 1.03 02/01/2020   CREATININE 0.94 01/31/2020   CREATININE 1.03 01/30/2020   CALCIUM 8.4 (L) 02/01/2020   CALCIUM 8.5 (L) 01/31/2020   CALCIUM 9.0 01/30/2020   LFT Recent Labs    01/30/20 1745 01/31/20 0500  PROT 6.5 6.0*  ALBUMIN 3.7 3.3*  AST 21 21  ALT 22 19  ALKPHOS 64 62  BILITOT 0.7 1.0   PT/INR Lab Results  Component Value Date   INR 1.3 (H) 01/30/2020   Hepatitis Panel No results for input(s): HEPBSAG, HCVAB, HEPAIGM, HEPBIGM in the last 72 hours. C-Diff No components found for: CDIFF Lipase  No results found for: LIPASE  Drugs of Abuse  No results found for: LABOPIA, COCAINSCRNUR, LABBENZ, AMPHETMU, THCU, LABBARB   RADIOLOGY STUDIES: CT Angio Abd/Pel w/ and/or w/o  Result Date: 01/31/2020  CLINICAL DATA:  Acute rectal bleeding since yesterday. EXAM: CT ANGIOGRAPHY ABDOMEN AND PELVIS WITH CONTRAST AND WITHOUT CONTRAST  TECHNIQUE: Multidetector CT imaging of the abdomen and pelvis was performed using the standard protocol during bolus administration of intravenous contrast. Multiplanar reconstructed images and MIPs were obtained and reviewed to evaluate the vascular anatomy. CONTRAST:  OMNIPAQUE IOHEXOL 350 MG/ML SOLN COMPARISON:  None. FINDINGS: VASCULAR Aorta: Normally patent with no evidence of aneurysm or dissection. Mild calcified plaque in the distal aorta. Celiac: Normally patent origin. 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 Maximal diameter is approximately 10 mm. Distal branch vessels are normally patent. SMA: Normally patent. Renals: Bilateral single renal arteries demonstrate normal patency. IMA: Normally patent. Inflow: Bilateral iliac arteries demonstrates mild atherosclerosis without aneurysm or significant obstructive disease. Proximal Outflow: Normally patent common femoral arteries and femoral bifurcations. Veins: Venous phase imaging demonstrates normal patency of visualized venous structures including mesenteric veins, the splenic vein, the portal vein, the IVC, iliac veins, common femoral veins and the renal veins. An IVC filter is in place in the infrarenal IVC and demonstrates no evidence of internal thrombus. There is some penetration some of the distal IVC filter legs posteriorly. Review of the MIP images confirms the above findings. NON-VASCULAR Lower chest: No acute abnormality.  Probable small hiatal hernia. Hepatobiliary: No focal liver abnormality is seen. Status post cholecystectomy. No biliary dilatation. Pancreas: Unremarkable. No pancreatic ductal dilatation or surrounding inflammatory changes. Spleen: No splenic injury or perisplenic hematoma. Adrenals/Urinary Tract: No adrenal masses. Bilateral simple renal cysts. No hydronephrosis or visualized renal calculi. The bladder demonstrates trabeculated appearance and  multiple probable small diverticula. Stomach/Bowel: Bowel shows no evidence obstruction, ileus, inflammation or lesion. Diverticulosis noted involving much of the colon without acute diverticulitis. During arterial and venous phases of imaging, there is no evidence of active contrast extravasation into the gastrointestinal tract. Lymphatic: No enlarged abdominal or pelvic lymph nodes. Reproductive: Prostate is unremarkable. Other: Small left inguinal hernia contains fat. No abdominopelvic ascites. Musculoskeletal: No acute or significant osseous findings. IMPRESSION: 1. No evidence of active contrast extravasation into the gastrointestinal tract on arterial and venous phases of imaging. 2. Diverticulosis of the colon without evidence of acute diverticulitis. 3. 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 4. IVC filter in place in the infrarenal IVC with no evidence of internal thrombus. There is some penetration of some of the distal IVC filter legs posteriorly. 5. Trabeculated appearance of the bladder with multiple small diverticula. 6. Small left inguinal hernia containing fat. Aortic Atherosclerosis (ICD10-I70.0). Electronically Signed   By: Irish Lack M.D.   On: 01/31/2020 12:13      IMPRESSION:   *   Painless hematochezia, improving. Previous episodes of hematochezia 2012 with colonoscopy then showing diverticulosis, no polyps.  Another episode in 2015. Suspect diverticular bleed.   CTAP with angiography does not confirm active bleeding, does confirm known presence of colonic diverticulosis.  *    Normocytic anemia.  Hb 10.3 >> 8.7, no priors for comparison.  *     Noncritical thrombocytopenia.  INR 1.3  *    Paroxysmal A. fib.  Chronic Eliquis.  Last dose 11/29.   Issue regarding restarting Eliquis is that even if colonoscopy only finds diverticulosis as potential source for bleeding, he is at future risk for repeat  *    Alzheimer's  dementia.  *   Small, nonobstructive, fat-containing inguinal hernia per CT  as well as above noted finding  *     IVC filter in situ.   PLAN:     *   Per Dr Meridee Score.     Jennye Moccasin  02/01/2020, 10:49 AM Phone [phone]

## 2020-02-01 NOTE — Progress Notes (Addendum)
PROGRESS NOTE    Ryan [name]  HYI:[id number] DOB: March 31, 1934 DOA: 01/30/2020 PCP: Ryan Laughter, MD   Brief Narrative:  Patient is 85 year old male with past medical history of A. fib on Eliquis, hyperlipidemia, BPH, Alzheimer's dementia presents to emergency department with bright red blood per rectum.  Upon arrival to ED: Patient was found to have hemoglobin of 10.3.  EDP revealed that patient had notable dark blood with clots in his adult diaper on arrival.  Patient was given 40 mg of IV Protonix as well as 20 mg of IV Pepcid.  Patient was then initiated on IV fluids.  GI consulted.  Patient admitted for further evaluation and management of acute blood loss anemia secondary to lower GI bleed.  Assessment & Plan:   Acute blood loss anemia/symptomatic anemia: -Likely in the setting of acute lower GI bleed/diverticular bleed -Patient presented with bright red blood per rectum. -Hemoglobin dropped from 10.3-9.9-8.7. -on IV PPI twice daily. -Evaluated by [name] recommended CTA abdomen to rule out mass lesion or active bleeding given patient underlying advanced dementia-thought he would not be able to complete/tolerate prep at this time. -Reviewed CT abdomen-shows diverticuli-no mass lesion or active bleeding noted. -Family requested second opinion.  Consulted [facility] & discussed with PA Ryan [name]. -Monitor H&H closely and transfuse if hemoglobin less than 7. -Continue to hold Eliquis.  Paroxysmal A. fib: Rate controlled. -CHA2DS2-VASc score: 3 -Continue to hold Eliquis.  Not on AV nodal blocking agents at home.  Continue to monitor heart rate closely on telemetry. -Consulted cardiology-recommended 2 options.  Proceeding with colonoscopy and if no source of bleeding found then restart Eliquis or stop Eliquis permanently given advanced Alzheimer's dementia and gait palliative care consult for goals of care.  Hypokalemia: Replenished. -Repeat BMP tomorrow a.m.  Thrombocytopenia:  Platelet 145. -Repeat CBC tomorrow AM.  Hyperlipidemia: Continue statin  BPH: Continue Flomax  Advanced Alzheimer's dementia: Without behavioral changes -Supportive care.  Left thigh/wound: Wound care consulted-appreciate input.  Inguinal hernia: Small, nonobstructive, fat-containing.  Noted on CT abdomen.  Goals of care: Patient is from [name] and currently living with his daughters who are taking care of him.  Apparently there has recently been a custody battle between the patient's wife and 3 daughters.  To resolve this issue patient has been appointed a guardian by the name of Ryan [name].  On admission-[name] was contacted by admitting physician who agreed with blood transfusion and endoscopic intervention if necessary.  DVT prophylaxis: SCD Code Status: Full code Family Communication: Patient's daughter present at bedside.  Plan of care discussed with patient and his daughters in length and they verbalized understanding and agreed with it. Disposition Plan: Home in 1 to 2 days   consultants:   GI  Procedures:   CT abdomen  Antimicrobials:   None  Status is: Observation  Dispo: The patient is from: Home              Anticipated d/c is to: Home              Anticipated d/c date is: 2 days              Patient currently is not medically stable to d/c.   Subjective: Patient seen and examined.  Pleasantly demented.  Denies any Ryan complaints.  Objective: Vitals:   01/31/20 1939 01/31/20 2321 02/01/20 0424 02/01/20 0700  BP:    (!) 153/87  Pulse:    74  Resp:    18  Temp: 98.2  F (36.8 C) 98 F (36.7 C) 97.8 F (36.6 C) 97.8 F (36.6 C)  TempSrc:    Oral  SpO2:    97%  Weight:      Height:        Intake/Output Summary (Last 24 hours) at 02/01/2020 1054 Last data filed at 02/01/2020 0600 Gross per 24 hour  Intake 240 ml  Output 1750 ml  Net -1510 ml   Filed Weights   01/30/20 1715  Weight: 79.4 kg    Examination:  General exam:  Appears calm and comfortable, on room air,  Respiratory system: Clear to auscultation. Respiratory effort normal. Cardiovascular system: S1 & S2 heard, RRR. No JVD, murmurs, rubs, gallops or clicks. No pedal edema. Gastrointestinal system: Abdomen is nondistended, soft and nontender. No organomegaly or masses felt. Normal bowel sounds heard. Central nervous system: Alert and oriented. No focal neurological deficits. Extremities: Symmetric 5 x 5 power. Skin: Dressing noted on left lateral upper thigh.  Dry intact.  No signs of active bleeding or discharge seen.   Data Reviewed: I have personally reviewed following labs and imaging studies  CBC: Recent Labs  Lab 01/30/20 1745 01/31/20 0046 01/31/20 0500 01/31/20 1337 02/01/20 0401  WBC 7.7  --  6.6  --  4.8  NEUTROABS 5.9  --  4.4  --   --   HGB 10.3* 9.3* 9.9* 9.4* 8.7*  HCT 33.2* 29.8* 32.2* 30.6* 27.4*  MCV 82.2  --  83.4  --  80.6  PLT 184  --  160  --  145*   Basic Metabolic Panel: Recent Labs  Lab 01/30/20 1745 01/31/20 0500 02/01/20 0401  NA 140 140 143  K 4.0 3.5 3.3*  CL 105 107 107  CO2 25 17* 26  GLUCOSE 115* 96 95  BUN 27* 26* 13  CREATININE 1.03 0.94 1.03  CALCIUM 9.0 8.5* 8.4*  MG  --  2.0 1.9   GFR: Estimated Creatinine Clearance: 57.9 mL/min (by C-G formula based on SCr of 1.03 mg/dL). Liver Function Tests: Recent Labs  Lab 01/30/20 1745 01/31/20 0500  AST 21 21  ALT 22 19  ALKPHOS 64 62  BILITOT 0.7 1.0  PROT 6.5 6.0*  ALBUMIN 3.7 3.3*   No results for input(s): LIPASE, AMYLASE in the last 168 hours. No results for input(s): AMMONIA in the last 168 hours. Coagulation Profile: Recent Labs  Lab 01/30/20 1745  INR 1.3*   Cardiac Enzymes: No results for input(s): CKTOTAL, CKMB, CKMBINDEX, TROPONINI in the last 168 hours. BNP (last 3 results) No results for input(s): PROBNP in the last 8760 hours. HbA1C: No results for input(s): HGBA1C in the last 72 hours. CBG: No results for input(s):  GLUCAP in the last 168 hours. Lipid Profile: Recent Labs    02/01/20 0401  CHOL 94  HDL 46  LDLCALC 40  TRIG 40  CHOLHDL 2.0   Thyroid Function Tests: No results for input(s): TSH, T4TOTAL, FREET4, T3FREE, THYROIDAB in the last 72 hours. Anemia Panel: No results for input(s): VITAMINB12, FOLATE, FERRITIN, TIBC, IRON, RETICCTPCT in the last 72 hours. Sepsis Labs: No results for input(s): PROCALCITON, LATICACIDVEN in the last 168 hours.  Recent Results (from the past 240 hour(s))  Resp Panel by RT-PCR (Flu A&B, Covid) Nasopharyngeal Swab     Status: None   Collection Time: 01/30/20  5:45 PM   Specimen: Nasopharyngeal Swab; Nasopharyngeal(NP) swabs in vial transport medium  Result Value Ref Range Status   SARS Coronavirus 2 by RT PCR NEGATIVE  NEGATIVE Final    Comment: (NOTE) SARS-CoV-2 target nucleic acids are NOT DETECTED.  The SARS-CoV-2 RNA is generally detectable in upper respiratory specimens during the acute phase of infection. The lowest concentration of SARS-CoV-2 viral copies this assay can detect is 138 copies/mL. A negative result does not preclude SARS-Cov-2 infection and should not be used as the sole basis for treatment or other patient management decisions. A negative result may occur with  improper specimen collection/handling, submission of specimen other than nasopharyngeal swab, presence of viral mutation(s) within the areas targeted by this assay, and inadequate number of viral copies(<138 copies/mL). A negative result must be combined with clinical observations, patient history, and epidemiological information. The expected result is Negative.  Fact Sheet for Patients:  BloggerCourse.comhttps://www.fda.gov/media/152166/download  Fact Sheet for Healthcare Providers:  SeriousBroker.ithttps://www.fda.gov/media/152162/download  This test is no t yet approved or cleared by the [place] FDA and  has been authorized for detection and/or diagnosis of SARS-CoV-2 by FDA under an  Emergency Use Authorization (EUA). This EUA will remain  in effect (meaning this test can be used) for the duration of the COVID-19 declaration under Section 564(b)(1) of the Act, 21 U.S.C.section 360bbb-3(b)(1), unless the authorization is terminated  or revoked sooner.       Influenza A by PCR NEGATIVE NEGATIVE Final   Influenza B by PCR NEGATIVE NEGATIVE Final    Comment: (NOTE) The Xpert Xpress SARS-CoV-2/FLU/RSV plus assay is intended as an aid in the diagnosis of influenza from Nasopharyngeal swab specimens and should not be used as a sole basis for treatment. Nasal washings and aspirates are unacceptable for Xpert Xpress SARS-CoV-2/FLU/RSV testing.  Fact Sheet for Patients: BloggerCourse.comhttps://www.fda.gov/media/152166/download  Fact Sheet for Healthcare Providers: SeriousBroker.ithttps://www.fda.gov/media/152162/download  This test is not yet approved or cleared by the [place] FDA and has been authorized for detection and/or diagnosis of SARS-CoV-2 by FDA under an Emergency Use Authorization (EUA). This EUA will remain in effect (meaning this test can be used) for the duration of the COVID-19 declaration under Section 564(b)(1) of the Act, 21 U.S.C. section 360bbb-3(b)(1), unless the authorization is terminated or revoked.  Performed at [facility], [address] [place], [place] [address]       Radiology Studies: CT Angio Abd/Pel w/ and/or w/o  Result Date: 01/31/2020 CLINICAL DATA:  Acute rectal bleeding since yesterday. EXAM: CT ANGIOGRAPHY ABDOMEN AND PELVIS WITH CONTRAST AND WITHOUT CONTRAST TECHNIQUE: Multidetector CT imaging of the abdomen and pelvis was performed using the standard protocol during bolus administration of intravenous contrast. Multiplanar reconstructed images and MIPs were obtained and reviewed to evaluate the vascular anatomy. CONTRAST:  100mL OMNIPAQUE IOHEXOL 350 MG/ML SOLN COMPARISON:  None. FINDINGS: VASCULAR Aorta: Normally patent with no evidence of  aneurysm or dissection. Mild calcified plaque in the distal aorta. Celiac: Normally patent origin. 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 Maximal diameter is approximately 10 mm. Distal branch vessels are normally patent. SMA: Normally patent. Renals: Bilateral single renal arteries demonstrate normal patency. IMA: Normally patent. Inflow: Bilateral iliac arteries demonstrates mild atherosclerosis without aneurysm or significant obstructive disease. Proximal Outflow: Normally patent common femoral arteries and femoral bifurcations. Veins: Venous phase imaging demonstrates normal patency of visualized venous structures including mesenteric veins, the splenic vein, the portal vein, the IVC, iliac veins, common femoral veins and the renal veins. An IVC filter is in place in the infrarenal IVC and demonstrates no evidence of internal thrombus. There is some penetration some of  the distal IVC filter legs posteriorly. Review of the MIP images confirms the above findings. NON-VASCULAR Lower chest: No acute abnormality.  Probable small hiatal hernia. Hepatobiliary: No focal liver abnormality is seen. Status post cholecystectomy. No biliary dilatation. Pancreas: Unremarkable. No pancreatic ductal dilatation or surrounding inflammatory changes. Spleen: No splenic injury or perisplenic hematoma. Adrenals/Urinary Tract: No adrenal masses. Bilateral simple renal cysts. No hydronephrosis or visualized renal calculi. The bladder demonstrates trabeculated appearance and multiple probable small diverticula. Stomach/Bowel: Bowel shows no evidence obstruction, ileus, inflammation or lesion. Diverticulosis noted involving much of the colon without acute diverticulitis. During arterial and venous phases of imaging, there is no evidence of active contrast extravasation into the gastrointestinal tract. Lymphatic: No enlarged abdominal or pelvic lymph nodes.  Reproductive: Prostate is unremarkable. Other: Small left inguinal hernia contains fat. No abdominopelvic ascites. Musculoskeletal: No acute or significant osseous findings. IMPRESSION: 1. No evidence of active contrast extravasation into the gastrointestinal tract on arterial and venous phases of imaging. 2. Diverticulosis of the colon without evidence of acute diverticulitis. 3. 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 4. IVC filter in place in the infrarenal IVC with no evidence of internal thrombus. There is some penetration of some of the distal IVC filter legs posteriorly. 5. Trabeculated appearance of the bladder with multiple small diverticula. 6. Small left inguinal hernia containing fat. Aortic Atherosclerosis (ICD10-I70.0). Electronically Signed   By: Irish Lack M.D.   On: 01/31/2020 12:13    Scheduled Meds: . atorvastatin  40 mg Oral Daily  . pantoprazole (PROTONIX) IV  40 mg Intravenous Q12H  . tamsulosin  0.4 mg Oral Daily   Continuous Infusions:    LOS: 1 day   Time spent: 40 minutes   Cameo Schmiesing Estill Cotta, MD [facility]  If 7PM-7AM, please contact night-coverage [email or web address] 02/01/2020, 10:54 AM

## 2020-02-02 ENCOUNTER — Inpatient Hospital Stay (HOSPITAL_COMMUNITY): Payer: Medicare Other

## 2020-02-02 ENCOUNTER — Telehealth: Payer: Self-pay | Admitting: Gastroenterology

## 2020-02-02 DIAGNOSIS — K5731 Diverticulosis of large intestine without perforation or abscess with bleeding: Secondary | ICD-10-CM | POA: Diagnosis not present

## 2020-02-02 DIAGNOSIS — K922 Gastrointestinal hemorrhage, unspecified: Secondary | ICD-10-CM | POA: Diagnosis not present

## 2020-02-02 DIAGNOSIS — R933 Abnormal findings on diagnostic imaging of other parts of digestive tract: Secondary | ICD-10-CM

## 2020-02-02 DIAGNOSIS — K209 Esophagitis, unspecified without bleeding: Secondary | ICD-10-CM

## 2020-02-02 DIAGNOSIS — Z7901 Long term (current) use of anticoagulants: Secondary | ICD-10-CM | POA: Diagnosis not present

## 2020-02-02 LAB — BASIC METABOLIC PANEL
Anion gap: 11 (ref 5–15)
BUN: 11 mg/dL (ref 8–23)
CO2: 24 mmol/L (ref 22–32)
Calcium: 8.4 mg/dL — ABNORMAL LOW (ref 8.9–10.3)
Chloride: 103 mmol/L (ref 98–111)
Creatinine, Ser: 1.11 mg/dL (ref 0.61–1.24)
GFR, Estimated: >60 mL/min (ref >60)
Glucose, Bld: 91 mg/dL (ref 70–99)
Potassium: 3.5 mmol/L (ref 3.5–5.1)
Sodium: 138 mmol/L (ref 135–145)

## 2020-02-02 LAB — CBC
HCT: 28.5 % — ABNORMAL LOW (ref 39.0–52.0)
Hemoglobin: 8.9 g/dL — ABNORMAL LOW (ref 13.0–17.0)
MCH: 25.3 pg — ABNORMAL LOW (ref 26.0–34.0)
MCHC: 31.2 g/dL (ref 30.0–36.0)
MCV: 81 fL (ref 80.0–100.0)
Platelets: 156 10*3/uL (ref 150–400)
RBC: 3.52 MIL/uL — ABNORMAL LOW (ref 4.22–5.81)
RDW: 15.3 % (ref 11.5–15.5)
WBC: 5.3 10*3/uL (ref 4.0–10.5)
nRBC: 0 % (ref 0.0–0.2)

## 2020-02-02 LAB — MAGNESIUM: Magnesium: 1.9 mg/dL (ref 1.7–2.4)

## 2020-02-02 IMAGING — RF DG UGI W SINGLE CM
12 of 22 series · 12 of 24 positions shown · non-contrast
Comparison: CT angiography of the abdomen and pelvis on IVORY

CLINICAL DATA: Question of GERD, history of gastrointestinal
bleeding

EXAM:
UPPER GI SERIES WITH KUB
TECHNIQUE: After obtaining a scout radiograph a routine upper GI series was
performed using thin barium
FLUOROSCOPY TIME:  Fluoroscopy Time:  3 minutes 54 seconds
Radiation Exposure Index (if provided by the fluoroscopic device):
24 mGy
Number of Acquired Spot Images: 0

[Series 2: cp_standard · 0.35mm/px · 1 of 18 frames shown (1 of 12)]
[frame 10/18]
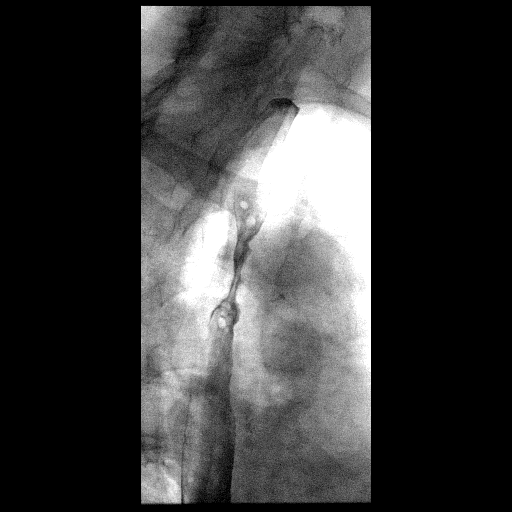

[Series 4: cp_standard · 0.35mm/px · 1 of 8 frames shown (2 of 12)]
[frame 4/8]
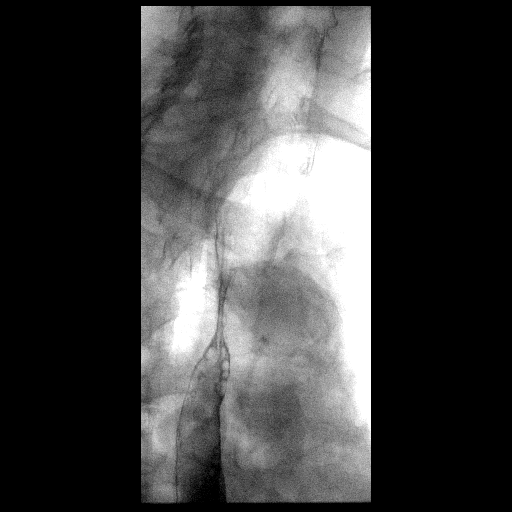

[Series 6: cp_standard · 0.17mm/px · 1 of 1 slices shown (3 of 12)]
[im 1/1]
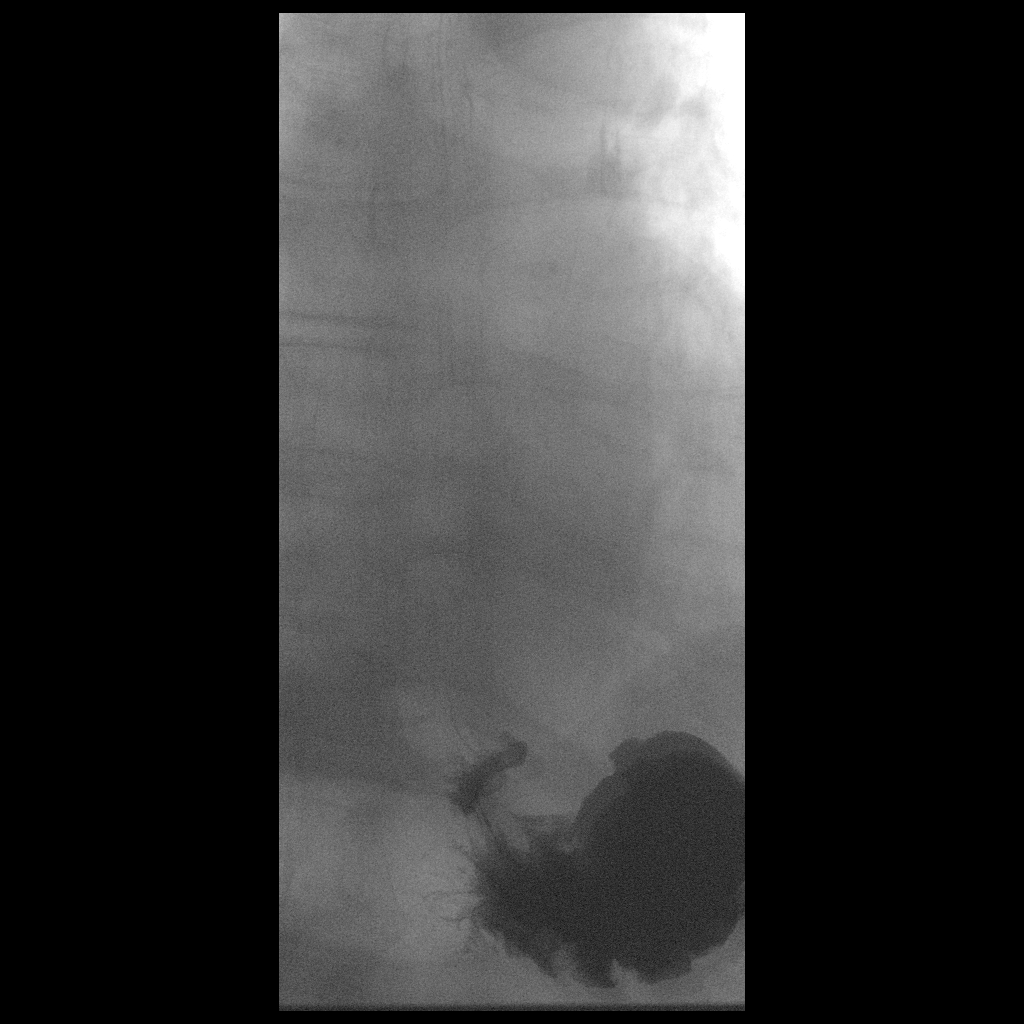

[Series 8: cp_standard · 0.35mm/px · 1 of 5 frames shown (4 of 12)]
[frame 2/5]
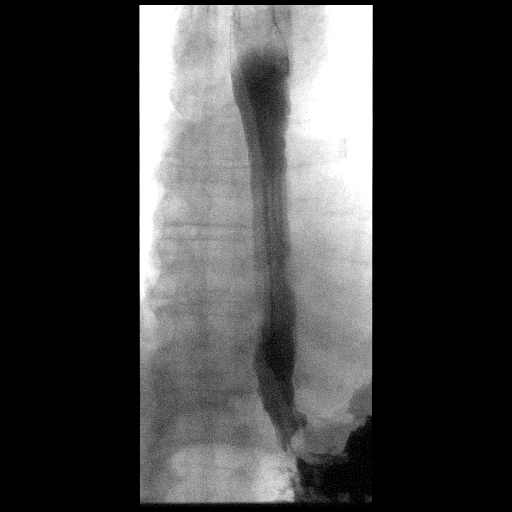

[Series 10: cp_standard · 0.35mm/px · 1 of 3 frames shown (5 of 12)]
[frame 2/3]
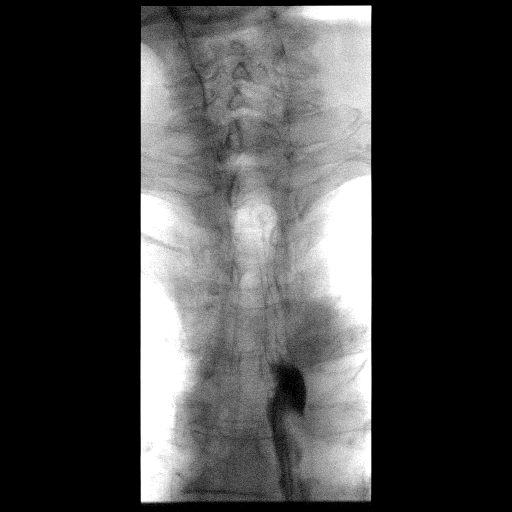

[Series 12: cp_standard · 0.52mm/px · 1 of 13 frames shown (6 of 12)]
[frame 2/13]
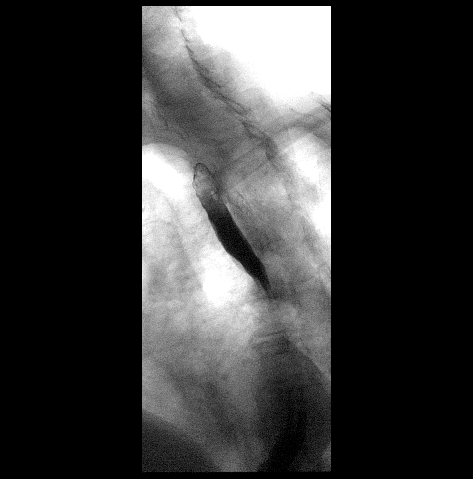

[Series 13: cp_standard · 0.35mm/px · 1 of 22 frames shown (7 of 12)]
[frame 19/22]
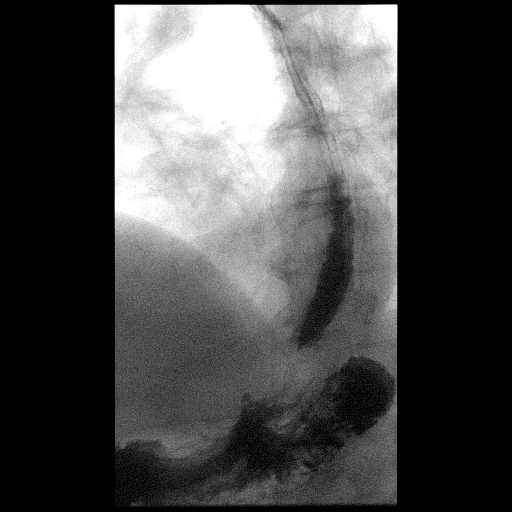

[Series 15: cp_standard · 0.34mm/px · 1 of 5 frames shown (8 of 12)]
[frame 3/5]
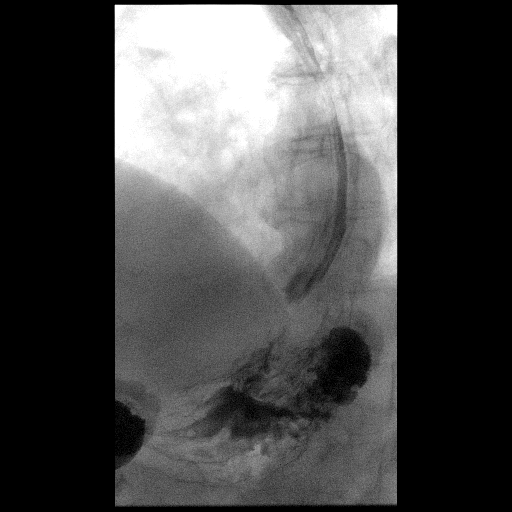

[Series 17: cp_standard · 0.35mm/px · 1 of 4 frames shown (9 of 12)]
[frame 1/4]
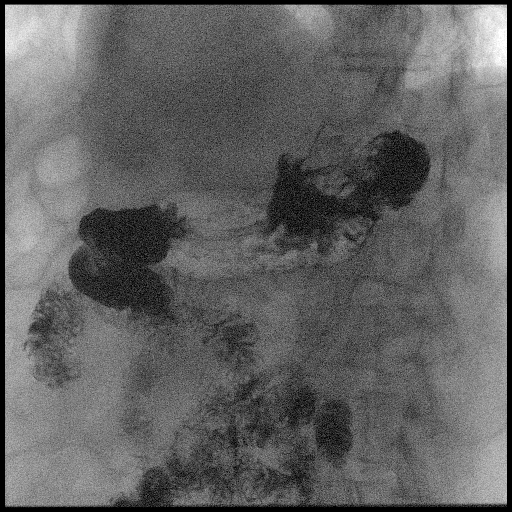

[Series 19: cp_standard · 0.34mm/px · 1 of 12 frames shown (10 of 12)]
[frame 7/12]
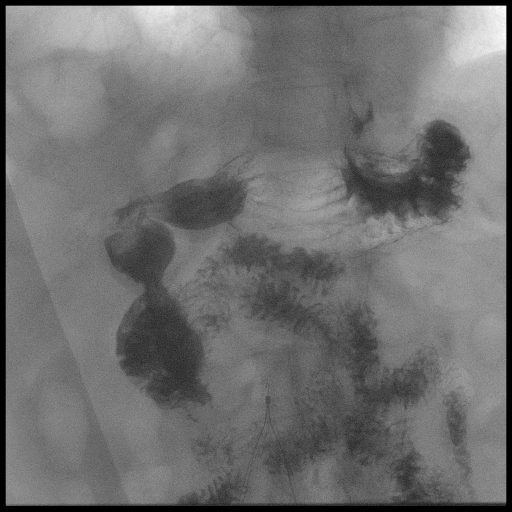

[Series 21: cp_standard · 0.35mm/px · 1 of 6 frames shown (11 of 12)]
[frame 1/6]
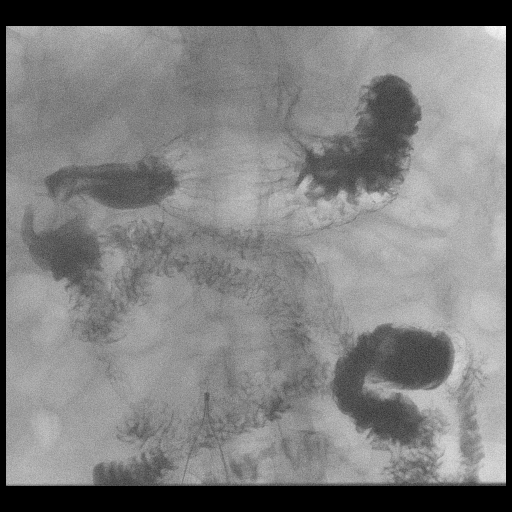

[Series 24: cp_standard · 0.18mm/px · 1 of 1 slices shown (12 of 12)]
[im 1/1]
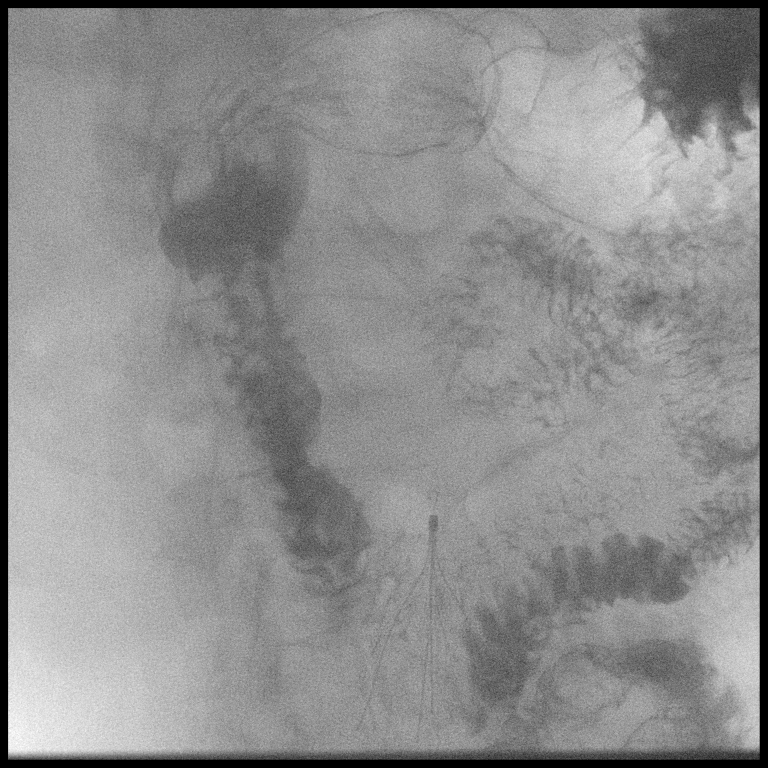

[12 of 24 positions shown; findings below may reference images not displayed]

FINDINGS: Scout radiograph of the abdomen with IVC filter projecting to the
RIGHT of the spine and changes of LEFT hip arthroplasty. The
visualized bowel gas pattern without signs of obstruction. Small
volume stool in the rectum. Spinal degenerative changes. Post
cholecystectomy with clips in the RIGHT upper quadrant.

The study was limited due to difficulty with patient positioning.
Also limited by the small amount of contrast could be ingested at
any given time by the patient.

Esophageal fold thickening and presumed dysmotility with tertiary
peristaltic activity. Standard views could not be obtained due to
difficulty in patient positioning to assess motility. Small hiatal
hernia.

Esophageal distensibility grossly normal.

Stomach without gross abnormality, limited assessment on single
contrast evaluation. Contrast flowed readily from the stomach into
the proximal duodenum and beyond into the jejunum. Duodenal jejunal
anatomy is normal with retroperitoneal course of the duodenum
crossing the midline to the ligament of Treitz.
IMPRESSION: Esophageal dysmotility, fold thickening and small hiatal hernia.
Findings could represent mild esophagitis.

Grossly normal appearance of stomach and proximal small bowel as
described.

Limited due to volume of contrast could be ingested and secondary to
patient positioning.

## 2020-02-02 NOTE — Telephone Encounter (Signed)
Taken care of. GM 

## 2020-02-02 NOTE — Progress Notes (Signed)
Daily Rounding Note  02/02/2020, 10:00 AM  LOS: 2 days   SUBJECTIVE:   Chief complaint:  Painless hematochezia.    No BM's or hematochezia reported on overnight shift or on current shift so far.       OBJECTIVE:         Vital signs in last 24 hours:    Temp:  [97.7 F (36.5 C)-98.6 F (37 C)] 97.7 F (36.5 C) (12/02 0407) Pulse Rate:  [65-73] 73 (12/01 1541) Resp:  [16-19] 19 (12/01 1541) BP: (139-161)/(77-89) 161/89 (12/01 1541) SpO2:  [97 %-100 %] 100 % (12/01 1541) Last BM Date: 02/01/20 Filed Weights   01/30/20 1715  Weight: 79.4 kg   Pt not re-examined  Intake/Output from previous day: 12/01 0701 - 12/02 0700 In: 1220 [P.O.:1220] Out: 1150 [Urine:1150]  Intake/Output this shift: No intake/output data recorded.  Lab Results: Recent Labs    01/31/20 0500 01/31/20 0500 01/31/20 1337 02/01/20 0401 02/02/20 0042  WBC 6.6  --   --  4.8 5.3  HGB 9.9*   < > 9.4* 8.7* 8.9*  HCT 32.2*   < > 30.6* 27.4* 28.5*  PLT 160  --   --  145* 156   < > = values in this interval not displayed.   BMET Recent Labs    01/31/20 0500 02/01/20 0401 02/02/20 0042  NA 140 143 138  K 3.5 3.3* 3.5  CL 107 107 103  CO2 17* 26 24  GLUCOSE 96 95 91  BUN 26* 13 11  CREATININE 0.94 1.03 1.11  CALCIUM 8.5* 8.4* 8.4*   LFT Recent Labs    01/30/20 1745 01/31/20 0500  PROT 6.5 6.0*  ALBUMIN 3.7 3.3*  AST 21 21  ALT 22 19  ALKPHOS 64 62  BILITOT 0.7 1.0   PT/INR Recent Labs    01/30/20 1745  LABPROT 16.0*  INR 1.3*   Hepatitis Panel No results for input(s): HEPBSAG, HCVAB, HEPAIGM, HEPBIGM in the last 72 hours.  Studies/Results: DG UGI W SINGLE CM (SOL OR THIN BA)  Result Date: 02/02/2020 CLINICAL DATA:  Question of GERD, history of gastrointestinal bleeding EXAM: UPPER GI SERIES WITH KUB TECHNIQUE: After obtaining a scout radiograph a routine upper GI series was performed using thin barium FLUOROSCOPY  TIME:  Fluoroscopy Time:  3 minutes 54 seconds Radiation Exposure Index (if provided by the fluoroscopic device): 24 mGy Number of Acquired Spot Images: 0 COMPARISON:  CT angiography of the abdomen and pelvis on January 31, 2020 FINDINGS: Scout radiograph of the abdomen with IVC filter projecting to the RIGHT of the spine and changes of LEFT hip arthroplasty. The visualized bowel gas pattern without signs of obstruction. Small volume stool in the rectum. Spinal degenerative changes. Post cholecystectomy with clips in the RIGHT upper quadrant. The study was limited due to difficulty with patient positioning. Also limited by the small amount of contrast could be ingested at any given time by the patient. Esophageal fold thickening and presumed dysmotility with tertiary peristaltic activity. Standard views could not be obtained due to difficulty in patient positioning to assess motility. Small hiatal hernia. Esophageal distensibility grossly normal. Stomach without gross abnormality, limited assessment on single contrast evaluation. Contrast flowed readily from the stomach into the proximal duodenum and beyond into the jejunum. Duodenal jejunal anatomy is normal with retroperitoneal course of the duodenum crossing the midline to the ligament of Treitz. IMPRESSION: Esophageal dysmotility, fold thickening and small hiatal hernia. Findings  could represent mild esophagitis. Grossly normal appearance of stomach and proximal small bowel as described. Limited due to volume of contrast could be ingested and secondary to patient positioning. Electronically Signed   By: Donzetta Kohut M.D.   On: 02/02/2020 09:17   CT Angio Abd/Pel w/ and/or w/o  Result Date: 01/31/2020 CLINICAL DATA:  Acute rectal bleeding since yesterday. EXAM: CT ANGIOGRAPHY ABDOMEN AND PELVIS WITH CONTRAST AND WITHOUT CONTRAST TECHNIQUE: Multidetector CT imaging of the abdomen and pelvis was performed using the standard protocol during bolus  administration of intravenous contrast. Multiplanar reconstructed images and MIPs were obtained and reviewed to evaluate the vascular anatomy. CONTRAST:  OMNIPAQUE IOHEXOL 350 MG/ML SOLN COMPARISON:  None. FINDINGS: VASCULAR Aorta: Normally patent with no evidence of aneurysm or dissection. Mild calcified plaque in the distal aorta. Celiac: Normally patent origin. 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 Maximal diameter is approximately 10 mm. Distal branch vessels are normally patent. SMA: Normally patent. Renals: Bilateral single renal arteries demonstrate normal patency. IMA: Normally patent. Inflow: Bilateral iliac arteries demonstrates mild atherosclerosis without aneurysm or significant obstructive disease. Proximal Outflow: Normally patent common femoral arteries and femoral bifurcations. Veins: Venous phase imaging demonstrates normal patency of visualized venous structures including mesenteric veins, the splenic vein, the portal vein, the IVC, iliac veins, common femoral veins and the renal veins. An IVC filter is in place in the infrarenal IVC and demonstrates no evidence of internal thrombus. There is some penetration some of the distal IVC filter legs posteriorly. Review of the MIP images confirms the above findings. NON-VASCULAR Lower chest: No acute abnormality.  Probable small hiatal hernia. Hepatobiliary: No focal liver abnormality is seen. Status post cholecystectomy. No biliary dilatation. Pancreas: Unremarkable. No pancreatic ductal dilatation or surrounding inflammatory changes. Spleen: No splenic injury or perisplenic hematoma. Adrenals/Urinary Tract: No adrenal masses. Bilateral simple renal cysts. No hydronephrosis or visualized renal calculi. The bladder demonstrates trabeculated appearance and multiple probable small diverticula. Stomach/Bowel: Bowel shows no evidence obstruction, ileus, inflammation or lesion.  Diverticulosis noted involving much of the colon without acute diverticulitis. During arterial and venous phases of imaging, there is no evidence of active contrast extravasation into the gastrointestinal tract. Lymphatic: No enlarged abdominal or pelvic lymph nodes. Reproductive: Prostate is unremarkable. Other: Small left inguinal hernia contains fat. No abdominopelvic ascites. Musculoskeletal: No acute or significant osseous findings. IMPRESSION: 1. No evidence of active contrast extravasation into the gastrointestinal tract on arterial and venous phases of imaging. 2. Diverticulosis of the colon without evidence of acute diverticulitis. 3. 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 4. IVC filter in place in the infrarenal IVC with no evidence of internal thrombus. There is some penetration of some of the distal IVC filter legs posteriorly. 5. Trabeculated appearance of the bladder with multiple small diverticula. 6. Small left inguinal hernia containing fat. Aortic Atherosclerosis (ICD10-I70.0). Electronically Signed   By: Irish Lack M.D.   On: 01/31/2020 12:13    ASSESMENT:   *   Painless hematochezia CTAP w angi: diverticulosis, no masses, no active bleeding.    *   Dysphagia.  UGI series (limited by limited volume pt could/would ingest and pt position) w esoph dysmotility, small HH, thickened esoph folds may represent mild esophagitis.    *   Vina anemia.    *   PAF.  Chronic Eliquis.    *   Alzheimers dementia.    *  IVC filter in situ.     PLAN   *   Rec pall care goals of care so family can decide what to do in terms of Eliquis, colenoscopy/EGD.  If scopes planned will need consent from Kylle Lall ([phone]), the attorney w guardianship assigned due to custody dispute between pt's wife (lives in [place]) and 3 Dtrs (based in [place]). Spoke w Armanda Magic, cardiologist, and she also recs palliative GOC discussion to assist family w medical  decision making.    Note that even if all that is found on a colonoscopy is diverticulosis, this will have chance of recurrence that is accelerated by Eliquis but would not be eliminated by discontinuing the med.       Jennye Moccasin  02/02/2020, 10:00 AM Phone [phone]

## 2020-02-02 NOTE — Progress Notes (Signed)
PROGRESS NOTE  Ryan [name] [id number]:[id number] DOB: [birth year] DOA: 01/30/2020 PCP: [name], MD  Brief History   Patient is 85 year old male with past medical history of A. fib on Eliquis, hyperlipidemia, BPH, Alzheimer's dementia presents to emergency department with bright red blood per rectum.  Upon arrival to ED: Patient was found to have hemoglobin of 10.3.  EDP revealed that patient had notable dark blood with clots in his adult diaper on arrival.  Patient was given 40 mg of IV Protonix as well as 20 mg of IV Pepcid.  Patient was then initiated on IV fluids.  GI consulted.  Patient admitted for further evaluation and management of acute blood loss anemia secondary to lower GI bleed.  [facility] declined to perform upper GI or colonoscopy due to the patient's poor functional status. The patient's family requested a second opinion. [facility] was consulted to evaluate the patient. They have offered EGD and colonoscopy. Family is considering whether they wish to go forward with this plan.  Hemoglobin is stable at 8.9.  Consultants  . Gastroenterology  Procedures  . None  Antibiotics   Anti-infectives (From admission, onward)   None    .   Subjective  The patient is sitting up in a chair at bedside. No new complaints.  Objective   Vitals:  Vitals:   02/02/20 1116 02/02/20 1604  BP: (!) 145/71 140/82  Pulse: 64 75  Resp: 18 20  Temp: 97.8 F (36.6 C) 97.6 F (36.4 C)  SpO2: 100% 99%   Exam:  Constitutional:  . The patient is awake and alert. No new complaints. Respiratory:  . No increased work of breathing. . No wheezes, rales, or rhonchi . No tactile fremitus Cardiovascular:  . Regular rate and rhythm . No murmurs, ectopy, or gallups. . No lateral PMI. No thrills. Abdomen:  . Abdomen is soft, non-tender, non-distended . No hernias, masses, or organomegaly . Normoactive bowel sounds.  Musculoskeletal:  . No cyanosis, clubbing, or edema Skin:  . No  rashes, lesions, ulcers . palpation of skin: no induration or nodules Neurologic:  . CN 2-12 intact . Sensation all 4 extremities intact  I have personally reviewed the following:   Today's Data  . Vitals, BMP, CBC  Scheduled Meds: . atorvastatin  40 mg Oral Daily  . melatonin  3 mg Oral QHS  . pantoprazole (PROTONIX) IV  40 mg Intravenous Q12H  . tamsulosin  0.4 mg Oral Daily   Continuous Infusions:  Principal Problem:   Anemia due to acute blood loss Active Problems:   Acute GI bleeding   Mixed hyperlipidemia   Benign prostatic hyperplasia   AF (paroxysmal atrial fibrillation) (HCC)   GI bleed   Lower GI bleed   Primary hypertension   LOS: 2 days   A & P  Acute blood loss anemia/symptomatic anemia:Likely in the setting of acute lower GI bleed/diverticular bleed. Patient presented with bright red blood per rectum. Hemoglobin dropped from 10.3-9.9-8.7-8.9. He is receiving IV PPI twice daily. Evaluated by [facility] recommended CTA abdomen to rule out mass lesion or active bleeding given patient underlying advanced dementia-thought he would not be able to complete/tolerate prep at this time. Reviewed CT abdomen-shows diverticuli-no mass lesion or active bleeding noted. Family requested second opinion.  Consulted [facility] & discussed with PA Sarah. They have offered EGD and Colonoscopy. The patient's family is considering whether they wish to go forward. Monitor H&H closely and transfuse if hemoglobin less than 7. Continue to hold Eliquis.  Paroxysmal A. fib: Rate controlled. CHA2DS2-VASc score: 3 Continue to hold Eliquis.  Not on AV nodal blocking agents at home.  Continue to monitor heart rate closely on telemetry. Consulted cardiology-recommended 2 options.  Proceeding with colonoscopy and if no source of bleeding found then restart Eliquis or stop Eliquis permanently given advanced Alzheimer's dementia and gait palliative care consult for goals of care.  Hypokalemia:  Supplemented. Monitor.  Thrombocytopenia: Platelet 145. Monitor  Hyperlipidemia: Continue statin  BPH: Continue Flomax  Advanced Alzheimer's dementia: Without behavioral changes. Supportive care.  Left thigh/wound: Wound care consulted-appreciate input.  Inguinal hernia: Small, nonobstructive, fat-containing.  Noted on CT abdomen.  Goals of care: Patient is from [place] and currently living with his daughters who are taking care of him.  Apparently there has recently been a custody battle between the patient's wife and 3 daughters.  To resolve this issue patient has been appointed a guardian by the name of Ayvion Kavanagh.  On admission-[name] was contacted by admitting physician who agreed with blood transfusion and endoscopic intervention if necessary.  I have seen and examined this patient myself. I have spent 35 minutes in his evaluation and care.  DVT prophylaxis: SCD Code Status: Full code Family Communication: None available. Disposition Plan: Home in 1 to 2 days   Evelynn Hench, DO [facility] Direct contact: see [email or web address]  7PM-7AM contact night coverage as above 02/02/2020, 6:40 PM  LOS: 2 days

## 2020-02-03 ENCOUNTER — Telehealth: Payer: Self-pay | Admitting: Gastroenterology

## 2020-02-03 DIAGNOSIS — K5731 Diverticulosis of large intestine without perforation or abscess with bleeding: Secondary | ICD-10-CM | POA: Diagnosis not present

## 2020-02-03 DIAGNOSIS — K922 Gastrointestinal hemorrhage, unspecified: Secondary | ICD-10-CM | POA: Diagnosis not present

## 2020-02-03 DIAGNOSIS — Z7901 Long term (current) use of anticoagulants: Secondary | ICD-10-CM | POA: Diagnosis not present

## 2020-02-03 DIAGNOSIS — R933 Abnormal findings on diagnostic imaging of other parts of digestive tract: Secondary | ICD-10-CM | POA: Diagnosis not present

## 2020-02-03 NOTE — Plan of Care (Signed)
  Problem: Education: Goal: Ability to identify signs and symptoms of gastrointestinal bleeding will improve Outcome: Progressing   

## 2020-02-03 NOTE — Progress Notes (Signed)
PROGRESS NOTE  Ryan [name] UYQ:[id number] DOB: 1934-06-06 DOA: 01/30/2020 PCP: Ryan Laughter, MD  Brief History   Patient is 85 year old male with past medical history of A. fib on Eliquis, hyperlipidemia, BPH, Alzheimer's dementia presents to emergency department with bright red blood per rectum.  Upon arrival to ED: Patient was found to have hemoglobin of 10.3.  EDP revealed that patient had notable dark blood with clots in his adult diaper on arrival.  Patient was given 40 mg of IV Protonix as well as 20 mg of IV Pepcid.  Patient was then initiated on IV fluids.  GI consulted.  Patient admitted for further evaluation and management of acute blood loss anemia secondary to lower GI bleed.  [facility] declined to perform upper GI or colonoscopy due to the patient's poor functional status. The patient's family requested a second opinion. [facility] was consulted to evaluate the patient. They have offered EGD and colonoscopy. Family is considering whether they wish to go forward with this plan. They wish to talk with GI regarding the risks and benefits of the endoscopic work up. Palliative care has also been consulted based upon cardiology's recommendation.  Consultants  . Gastroenterology . Cardiology  Procedures  . None  Antibiotics   Anti-infectives (From admission, onward)   None      Subjective  The patient is sitting up in a chair at bedside. No new complaints. Daughter is with him. She reports that he has had a small amount of blood in his stool overnight.  Objective   Vitals:  Vitals:   02/03/20 0859 02/03/20 1224  BP: (!) 140/93 (!) 160/90  Pulse: 79 73  Resp: 13 14  Temp: 97.9 F (36.6 C) 97.8 F (36.6 C)  SpO2: 98% 98%   Exam:  Constitutional:  . The patient is awake and alert. No new complaints. Respiratory:  . No increased work of breathing. . No wheezes, rales, or rhonchi . No tactile fremitus Cardiovascular:  . Regular rate and rhythm . No murmurs,  ectopy, or gallups. . No lateral PMI. No thrills. Abdomen:  . Abdomen is soft, non-tender, non-distended . No hernias, masses, or organomegaly . Normoactive bowel sounds.  Musculoskeletal:  . No cyanosis, clubbing, or edema Skin:  . No rashes, lesions, ulcers . palpation of skin: no induration or nodules Neurologic:  . CN 2-12 intact . Sensation all 4 extremities intact  I have personally reviewed the following:   Today's Data  . Vitals, BMP, CBC  Scheduled Meds: . atorvastatin  40 mg Oral Daily  . melatonin  3 mg Oral QHS  . pantoprazole (PROTONIX) IV  40 mg Intravenous Q12H  . tamsulosin  0.4 mg Oral Daily   Continuous Infusions:  Principal Problem:   Anemia due to acute blood loss Active Problems:   Acute GI bleeding   Mixed hyperlipidemia   Benign prostatic hyperplasia   AF (paroxysmal atrial fibrillation) (HCC)   GI bleed   Lower GI bleed   Primary hypertension   LOS: 3 days   A & P  Acute blood loss anemia/symptomatic anemia:Likely in the setting of acute lower GI bleed/diverticular bleed. Patient presented with bright red blood per rectum. Hemoglobin dropped from 10.3-9.9-8.7-8.9. He is receiving IV PPI twice daily. Evaluated by [name] recommended CTA abdomen to rule out mass lesion or active bleeding given patient underlying advanced dementia-thought he would not be able to complete/tolerate prep at this time. Reviewed CT abdomen-shows diverticuli-no mass lesion or active bleeding noted. Family requested second  opinion.  [name] & discussed with PA Ryan [name]. They have offered EGD and Colonoscopy. The patient's family is considering whether they wish to go forward. Monitor H&H closely and transfuse if hemoglobin less than 7. Continue to hold Eliquis.  Paroxysmal A. fib: Rate controlled. CHA2DS2-VASc score: 3 Continue to hold Eliquis.  Not on AV nodal blocking agents at home.  Continue to monitor heart rate closely on telemetry. Consulted  cardiology-recommended 2 options.  Proceeding with colonoscopy and if no source of bleeding found then restart Eliquis or stop Eliquis permanently given advanced Alzheimer's dementia. Palliative care has been consulted.  Hypokalemia: Supplemented. Monitor.  Thrombocytopenia: Platelet 145. Monitor  Hyperlipidemia: Continue statin  BPH: Continue Flomax  Advanced Alzheimer's dementia: Without behavioral changes. Supportive care.  Left thigh/wound: Wound care consulted-appreciate input.  Inguinal hernia: Small, nonobstructive, fat-containing.  Noted on CT abdomen.  Goals of care: Patient is from [place] and currently living with his daughters who are taking care of him.  Apparently there has recently been a custody battle between the patient's wife and 3 daughters.  To resolve this issue patient has been appointed a guardian by the name of Ryan [name].  On admission-[name] was contacted by admitting physician who agreed with blood transfusion and endoscopic intervention if necessary.  I have seen and examined this patient myself. I have spent 32 minutes in his evaluation and care.  DVT prophylaxis: SCD Code Status: Full code Family Communication: None available. Disposition Plan: Home in 1 to 2 days   Ryan Delman, DO [facility] Direct contact: see [email or web address]  7PM-7AM contact night coverage as above 02/03/2020, 2:59 PM  LOS: 2 days

## 2020-02-04 DIAGNOSIS — K219 Gastro-esophageal reflux disease without esophagitis: Secondary | ICD-10-CM

## 2020-02-04 DIAGNOSIS — K922 Gastrointestinal hemorrhage, unspecified: Secondary | ICD-10-CM | POA: Diagnosis not present

## 2020-02-04 DIAGNOSIS — R933 Abnormal findings on diagnostic imaging of other parts of digestive tract: Secondary | ICD-10-CM | POA: Diagnosis not present

## 2020-02-04 DIAGNOSIS — G308 Other Alzheimer's disease: Secondary | ICD-10-CM | POA: Diagnosis not present

## 2020-02-04 DIAGNOSIS — K921 Melena: Secondary | ICD-10-CM

## 2020-02-04 DIAGNOSIS — R1110 Vomiting, unspecified: Secondary | ICD-10-CM | POA: Diagnosis not present

## 2020-02-04 DIAGNOSIS — K5731 Diverticulosis of large intestine without perforation or abscess with bleeding: Secondary | ICD-10-CM | POA: Diagnosis not present

## 2020-02-04 DIAGNOSIS — F0280 Dementia in other diseases classified elsewhere without behavioral disturbance: Secondary | ICD-10-CM

## 2020-02-04 DIAGNOSIS — K5790 Diverticulosis of intestine, part unspecified, without perforation or abscess without bleeding: Secondary | ICD-10-CM

## 2020-02-04 DIAGNOSIS — R142 Eructation: Secondary | ICD-10-CM | POA: Diagnosis not present

## 2020-02-04 DIAGNOSIS — D62 Acute posthemorrhagic anemia: Secondary | ICD-10-CM | POA: Diagnosis not present

## 2020-02-04 DIAGNOSIS — Z515 Encounter for palliative care: Secondary | ICD-10-CM

## 2020-02-04 DIAGNOSIS — Z7901 Long term (current) use of anticoagulants: Secondary | ICD-10-CM | POA: Diagnosis not present

## 2020-02-04 LAB — HEMOGLOBIN AND HEMATOCRIT, BLOOD
HCT: 29.8 % — ABNORMAL LOW (ref 39.0–52.0)
Hemoglobin: 9.3 g/dL — ABNORMAL LOW (ref 13.0–17.0)

## 2020-02-04 MED ORDER — PANTOPRAZOLE SODIUM 40 MG PO TBEC
40.0000 mg | DELAYED_RELEASE_TABLET | Freq: Two times a day (BID) | ORAL | Status: DC
  Administered 2020-02-04 – 2020-02-06 (×4): 40 mg via ORAL
  Filled 2020-02-04 (×4): qty 1

## 2020-02-04 NOTE — Progress Notes (Addendum)
 Gastroenterology Inpatient Follow-up Note   PATIENT IDENTIFICATION  Ryan [name] is a 85 y.o. male with a PMH significant for dementia, atrial fibrillation on chronic anticoagulation, BPH, hypertension, hyperlipidemia, prior stroke.  The patient was admitted to the hospital with bright red blood per rectum from a presumed diverticular bleed with concern for restart of anticoagulation and risks of being off of it. Hospital Day: 6  SUBJECTIVE  No acute events today. Patient blood counts are stable to improving. No new abdominal pain or discomfort. Discussed case with patient's daughters at bedside.   OBJECTIVE  Scheduled Inpatient Medications:  . atorvastatin  40 mg Oral Daily  . melatonin  3 mg Oral QHS  . pantoprazole  40 mg Oral BID  . tamsulosin  0.4 mg Oral Daily   Continuous Inpatient Infusions:  PRN Inpatient Medications: acetaminophen **OR** acetaminophen, ondansetron **OR** ondansetron (ZOFRAN) IV, polyethylene glycol   Physical Examination  Temp:  [97.8 F (36.6 C)-98.7 F (37.1 C)] 97.8 F (36.6 C) (12/04 1118) Pulse Rate:  [76-94] 85 (12/04 1118) Resp:  [16-20] 16 (12/04 0729) BP: (114-142)/(66-90) 130/86 (12/04 1118) SpO2:  [98 %-100 %] 98 % (12/04 1118) Temp (24hrs), Avg:98.2 F (36.8 C), Min:97.8 F (36.6 C), Max:98.7 F (37.1 C)  Weight: 79.4 kg GEN: Chronically ill-appearing but nontoxic PSYCH: Cooperative, resting EYE: Conjunctivae pink ENT: MMM CV: Nontachycardic RESP: No audible wheezing GI: NABS, soft, NT/ND, without rebound or guarding MSK/EXT: Trace lower extremity edema present SKIN: No jaundice   Review of Data   Laboratory Studies   Recent Labs  Lab 02/02/20 0042  NA 138  K 3.5  CL 103  CO2 24  BUN 11  CREATININE 1.11  GLUCOSE 91  CALCIUM 8.4*  MG 1.9   Recent Labs  Lab 01/31/20 0500  AST 21  ALT 19  ALKPHOS 62    Recent Labs  Lab 01/31/20 0500 01/31/20 1337 02/01/20 0401 02/01/20 0401 02/02/20 0042  02/02/20 0042 02/04/20 1101  WBC 6.6  --  4.8   < > 5.3  --   --   HGB 9.9*   < > 8.7*   < > 8.9*   < > 9.3*  HCT 32.2*   < > 27.4*   < > 28.5*   < > 29.8*  PLT 160  --  145*  --  156  --   --    < > = values in this interval not displayed.   Recent Labs  Lab 01/30/20 1745  INR 1.3*   Imaging Studies  Upper GI series IMPRESSION: Esophageal dysmotility, fold thickening and small hiatal hernia. Findings could represent mild esophagitis. Grossly normal appearance of stomach and proximal small bowel as described. Limited due to volume of contrast could be ingested and secondary to patient positioning.  GI Procedures and Studies  No new procedures to review   ASSESSMENT  [name] is a 85 y.o. male with a PMH significant for dementia, atrial fibrillation on chronic anticoagulation, BPH, hypertension, hyperlipidemia, prior stroke.  The patient was admitted to the hospital with bright red blood per rectum from a presumed diverticular bleed with concern for restart of anticoagulation and risks of being off of it.  The patient is hemodynamically stable.  Discussion with the patient's family is such that they would like to move forward with a diagnostic endoscopy with potential dilation.  I think this is reasonable based on the findings of the upper endoscopy as well as this patient's symptoms of belching and GERD and   reflux.  We may not find anything more than esophagitis but we can rule out other issues as a result of the thickening that was noted.  Certainly considered the potential of a colonoscopy but the patient's family wants to defer on this and talk specifically to the primary care doctor whom they have an appointment with later this week.  We will try to move forward with a diagnostic endoscopy tomorrow.  I have had an opportunity to speak with the patient's power of attorney whom also agrees that the patient's 3 daughters can make decisions on their fathers behalf but is happy to be  available at any time.  The risks and benefits of endoscopic evaluation were discussed with the patient; these include but are not limited to the risk of perforation, infection, bleeding, missed lesions, lack of diagnosis, severe illness requiring hospitalization, as well as anesthesia and sedation related illnesses.  The POA and family are agreeable to proceed.    PLAN/RECOMMENDATIONS  N.p.o. at midnight Tentative plan for EGD on 12/5 -Have discussed case with POA Catalina Antigua who agrees with endoscopic evaluation being performed and has already given paperwork that allows the 3 daughters to help make decisions for their father but he is happy to still be available for any discussions or consents as necessary Holding on colonoscopy per patient family desires Anticoagulation restart as per medicine service and cardiology and patient's family but for now being held Appreciate palliative care and cardiology and medical service for care of this patient  Please page/call with questions or concerns.   Corliss Parish, MD [facility] Office # [phone]    LOS: 4 days  Lemar Lofty  02/04/2020, 6:34 PM   700PM Addendum Spoke with family and patient again.  Will plan for EGD/Flexible Sigmoidoscopy tomorrow.  800AM tap water enema to be given.  Will help clarify anorectal source of potential bleeding as well.  Less risk that Colonoscopy.  Corliss Parish, MD  [facility] Office # [phone]

## 2020-02-04 NOTE — Progress Notes (Signed)
PROGRESS NOTE  Ryan [name] [id number]:[id number] DOB: 02/03/1935 DOA: 01/30/2020 PCP: [name], MD  Brief History   Patient is 85 year old male with past medical history of A. fib on Eliquis, hyperlipidemia, BPH, Alzheimer's dementia presents to emergency department with bright red blood per rectum.  Upon arrival to ED: Patient was found to have hemoglobin of 10.3.  EDP revealed that patient had notable dark blood with clots in his adult diaper on arrival.  Patient was given 40 mg of IV Protonix as well as 20 mg of IV Pepcid.  Patient was then initiated on IV fluids.  GI consulted.  Patient admitted for further evaluation and management of acute blood loss anemia secondary to lower GI bleed.  [facility] declined to perform upper GI or colonoscopy due to the patient's poor functional status. The patient's family requested a second opinion. [facility] was consulted to evaluate the patient. They have offered EGD and colonoscopy. Family is considering whether they wish to go forward with this plan. They wish to talk with GI regarding the risks and benefits of the endoscopic work up. GI wishes for this to be addressed through palliative care. Palliative care has also been consulted based upon cardiology's recommendation as well as GI recommendation.  Palliative care was consulted to assist with determination of family wishes with regard to EGD or colonoscopy.   Consultants  . Gastroenterology . Cardiology  Procedures  . None  Antibiotics   Anti-infectives (From admission, onward)   None      Subjective  The patient is sitting up in a chair at bedside. No new complaints. Daughter is at bedside.  Objective   Vitals:  Vitals:   02/04/20 0729 02/04/20 1118  BP: (!) 142/90 130/86  Pulse: 80 85  Resp: 16   Temp: 97.8 F (36.6 C) 97.8 F (36.6 C)  SpO2: 99% 98%   Exam:  Constitutional:  . The patient is awake and alert. No new complaints. Respiratory:  . No increased work of  breathing. . No wheezes, rales, or rhonchi . No tactile fremitus Cardiovascular:  . Regular rate and rhythm . No murmurs, ectopy, or gallups. . No lateral PMI. No thrills. Abdomen:  . Abdomen is soft, non-tender, non-distended . No hernias, masses, or organomegaly . Normoactive bowel sounds.  Musculoskeletal:  . No cyanosis, clubbing, or edema Skin:  . No rashes, lesions, ulcers . palpation of skin: no induration or nodules Neurologic:  . CN 2-12 intact . Sensation all 4 extremities intact  I have personally reviewed the following:   Today's Data  . Vitals, BMP, CBC  Scheduled Meds: . atorvastatin  40 mg Oral Daily  . melatonin  3 mg Oral QHS  . pantoprazole (PROTONIX) IV  40 mg Intravenous Q12H  . tamsulosin  0.4 mg Oral Daily   Continuous Infusions:  Principal Problem:   Anemia due to acute blood loss Active Problems:   Acute GI bleeding   Mixed hyperlipidemia   Benign prostatic hyperplasia   AF (paroxysmal atrial fibrillation) (HCC)   GI bleed   Lower GI bleed   Primary hypertension   LOS: 4 days   A & P  Acute blood loss anemia/symptomatic anemia:Likely in the setting of acute lower GI bleed/diverticular bleed. Patient presented with bright red blood per rectum. Hemoglobin dropped from 10.3-9.9-8.7-8.9. He is receiving IV PPI twice daily. Evaluated by [facility] recommended CTA abdomen to rule out mass lesion or active bleeding given patient underlying advanced dementia-thought he would not be able to  complete/tolerate prep at this time. Reviewed CT abdomen-shows diverticuli-no mass lesion or active bleeding noted. Family requested second opinion.  Consulted [facility] & discussed with PA Sarah. They have offered EGD and Colonoscopy. The patient's family is considering whether they wish to go forward. Monitor H&H closely and transfuse if hemoglobin less than 7.Continue to hold Eliquis. H&H is 9.3 today.  Paroxysmal A. fib: Rate controlled. CHA2DS2-VASc score:  3 Continue to hold Eliquis.  Not on AV nodal blocking agents at home. Continue to monitor heart rate closely on telemetry. Consulted cardiology-recommended 2 options.  Proceeding with colonoscopy and if no source of bleeding found then restart Eliquis or stop Eliquis permanently given advanced Alzheimer's dementia. Palliative care has been consulted.  Hypokalemia: Supplemented. Monitor.  Thrombocytopenia: Platelet 145. Monitor  Hyperlipidemia: Continue statin  BPH: Continue Flomax  Advanced Alzheimer's dementia: Without behavioral changes. Supportive care.  Left thigh/wound: Wound care consulted-appreciate input.  Inguinal hernia: Small, nonobstructive, fat-containing.  Noted on CT abdomen.  Goals of care: Palliative care consulted. Patient is from [place] and currently living with his daughters who are taking care of him.  Apparently there has recently been a custody battle between the patient's wife and 3 daughters.  To resolve this issue patient has been appointed a guardian by the name of Yasir Kitner.  On admission-[name] was contacted by admitting physician who agreed with blood transfusion and endoscopic intervention if necessary.  I have seen and examined this patient myself. I have spent 34 minutes in his evaluation and care.  DVT prophylaxis: SCD Code Status: Full code Family Communication: None available. Disposition Plan: Home in 1 to 2 days   Orrie Lascano, DO [facility] Direct contact: see [email or web address]  7PM-7AM contact night coverage as above 02/04/2020, 2:33 PM  LOS: 2 days

## 2020-02-04 NOTE — Consult Note (Signed)
Consultation Note Date: 02/04/2020   Patient Name: Ryan [name]  DOB: [birth year]  MRN: [id number]  Age / Sex: 85 y.o., male  PCP: Lajean Manes, MD Referring Physician: Karie Kirks, DO  Reason for Consultation: Establishing goals of care  HPI/Patient Profile: 85 y.o. male  with past medical history of moderate to severe alzheimers dementia, CVA, paroxysmal atrial fibrillation, hx of GIB in 2015, chronic anemia and BPH who was admitted on 01/30/2020 with [facility].  He was evaluated by GI who offered colonoscopy and endoscopy.  While the family was considering these options a CTA abd/pelvis and a barium swallow was done.  There were no mass lesions on either exam, but the barium swallow did show esophageal thickening and dysmotility.  Clinical Assessment and Goals of Care:   I have reviewed medical records including EPIC notes, labs and imaging, received report from the care team, examined the patient and met at bedside with Nunzio Cory in person and her sisters Hilda Blades and Pamala Hurry on the phone to discuss diagnosis prognosis, [place], EOL wishes, disposition and options.  I introduced Palliative Medicine as specialized medical care for people living with serious illness. It focuses on providing relief from the symptoms and stress of a serious illness.   We discussed a brief life review of the patient.  Mr. 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  He enjoyed flying his plan, big game hunting and fishing.  He was diagnosed with Alzheimer's in 2015.  His daughters tell me that at home he is able to walk and eats well.  He is able to feed himself.  But they have to help him with all ADLs including dressing and bathing.  He is frequently incontinent of both bowel and bladder.  He was recently assessed by neurology who classified his Alzheimer's dementia as moderate to severe.  The patient has a  wife in [place].  They were married in 2017.  He lives with her for several months and then will live with his daughters here in [place] for several months.  It seems there is a trend towards his spending less time with his wife had more time in the care of his daughters.  We discussed his current illness and what it means in the larger context of his on-going co-morbidities.  Natural disease trajectory and expectations at EOL were discussed.  Specifically we talked about diverticular bleeding, history of stroke, paroxysmal atrial fibrillation, antiplatelets, anticoagulation, gastritis, and esophageal dysmotility.  I attempted to elicit values and goals of care important to the patient.  His daughters thought about this question and felt as though he would not be content with his life as it is now.  He does not want to have to be dependent.   The difference between aggressive medical intervention and comfort care was considered in light of the patient's goals of care.   Advanced directives, concepts specific to code status, artifical feeding and hydration, and rehospitalization were considered and discussed.  After a long detailed  conversation his daughters decided that they do not want to pursue colonoscopy.  They would like to move forward with upper endoscopy.  They want to continue to hold anticoagulation until they can have a conversation with the patient's PCP, Dr. Felipa Eth on Tuesday morning December 7.  We also talked explicitly about CODE STATUS.  Daughters were inclined to believe that he would want DNR.  He has an advanced directive that indicates he would not want aggressive measures at end-of-life.  However they would like to discuss this with Dr. Felipa Eth as well before making a final decision.  Family ask for support and resources aimed towards Alzheimer's dementia.  We talked about investigating ALF placement.  Hospice and Palliative Care services outpatient were explained and  offered.  Questions and concerns were addressed.  The family was encouraged to call with questions or concerns.    Length of Stay: 4   Vital Signs: BP 130/86 (BP Location: Right Arm)   Pulse 85   Temp 97.8 F (36.6 C) (Oral)   Resp 16   Ht $R'5\' 11"'cx$  (1.803 m)   Wt 79.4 kg   SpO2 98%   BMI 24.41 kg/m  SpO2: SpO2: 98 % O2 Device: O2 Device: Room Air O2 Flow Rate:         Palliative Assessment/Data:  40-50%      Palliative Care Plan    Recommendations/Plan:  Family will consider CODE STATUS and anticoagulation with patient's PCP Dr. Felipa Eth on Tuesday morning.  Patient has a previously placed IVC filter.  It appears the question of anticoagulation has likely been considered and decided against previously  Family would like to proceed with an upper endoscopy during this hospitalization.  They do not want a colonoscopy.  Code Status:  Full code to be discussed with PCP  Prognosis:  Unable to determine.  Patient is still eating well and walking well.  However a rapid decline or rehospitalization within the next 12 months would not be surprising.  Discharge Planning:  Home with Home Health and palliative services  Care plan was discussed with daughters.  Communicated the results of the consultation to Dr. Benny Lennert  Thank you for allowing the Palliative Medicine Team to assist in the care of this patient.  Total time spent:  60 min.     Greater than 50%  of this time was spent counseling and coordinating care related to the above assessment and plan.  Florentina Jenny, PA-C Palliative Medicine  Please contact Palliative MedicineTeam phone at [phone] for questions and concerns between 7 am - 7 pm.   Please see AMION for individual provider pager numbers.

## 2020-02-04 NOTE — H&P (View-Only) (Signed)
Gastroenterology Inpatient Follow-up Note   PATIENT IDENTIFICATION  Ryan [name] is a 85 y.o. male with a PMH significant for dementia, atrial fibrillation on chronic anticoagulation, BPH, hypertension, hyperlipidemia, prior stroke.  The patient was admitted to the hospital with bright red blood per rectum from a presumed diverticular bleed with concern for restart of anticoagulation and risks of being off of it. Hospital Day: 6  SUBJECTIVE  No acute events today. Patient blood counts are stable to improving. No new abdominal pain or discomfort. Discussed case with patient's daughters at bedside.   OBJECTIVE  Scheduled Inpatient Medications:  . atorvastatin  40 mg Oral Daily  . melatonin  3 mg Oral QHS  . pantoprazole  40 mg Oral BID  . tamsulosin  0.4 mg Oral Daily   Continuous Inpatient Infusions:  PRN Inpatient Medications: acetaminophen **OR** acetaminophen, ondansetron **OR** ondansetron (ZOFRAN) IV, polyethylene glycol   Physical Examination  Temp:  [97.8 F (36.6 C)-98.7 F (37.1 C)] 97.8 F (36.6 C) (12/04 1118) Pulse Rate:  [76-94] 85 (12/04 1118) Resp:  [16-20] 16 (12/04 0729) BP: (114-142)/(66-90) 130/86 (12/04 1118) SpO2:  [98 %-100 %] 98 % (12/04 1118) Temp (24hrs), Avg:98.2 F (36.8 C), Min:97.8 F (36.6 C), Max:98.7 F (37.1 C)  Weight: 79.4 kg GEN: Chronically ill-appearing but nontoxic PSYCH: Cooperative, resting EYE: Conjunctivae pink ENT: MMM CV: Nontachycardic RESP: No audible wheezing GI: NABS, soft, NT/ND, without rebound or guarding MSK/EXT: Trace lower extremity edema present SKIN: No jaundice   Review of Data   Laboratory Studies   Recent Labs  Lab 02/02/20 0042  NA 138  K 3.5  CL 103  CO2 24  BUN 11  CREATININE 1.11  GLUCOSE 91  CALCIUM 8.4*  MG 1.9   Recent Labs  Lab 01/31/20 0500  AST 21  ALT 19  ALKPHOS 62    Recent Labs  Lab 01/31/20 0500 01/31/20 1337 02/01/20 0401 02/01/20 0401 02/02/20 0042  02/02/20 0042 02/04/20 1101  WBC 6.6  --  4.8   < > 5.3  --   --   HGB 9.9*   < > 8.7*   < > 8.9*   < > 9.3*  HCT 32.2*   < > 27.4*   < > 28.5*   < > 29.8*  PLT 160  --  145*  --  156  --   --    < > = values in this interval not displayed.   Recent Labs  Lab 01/30/20 1745  INR 1.3*   Imaging Studies  Upper GI series IMPRESSION: Esophageal dysmotility, fold thickening and small hiatal hernia. Findings could represent mild esophagitis. Grossly normal appearance of stomach and proximal small bowel as described. Limited due to volume of contrast could be ingested and secondary to patient positioning.  GI Procedures and Studies  No new procedures to review   ASSESSMENT  [name] is a 85 y.o. male with a PMH significant for dementia, atrial fibrillation on chronic anticoagulation, BPH, hypertension, hyperlipidemia, prior stroke.  The patient was admitted to the hospital with bright red blood per rectum from a presumed diverticular bleed with concern for restart of anticoagulation and risks of being off of it.  The patient is hemodynamically stable.  Discussion with the patient's family is such that they would like to move forward with a diagnostic endoscopy with potential dilation.  I think this is reasonable based on the findings of the upper endoscopy as well as this patient's symptoms of belching and GERD and  reflux.  We may not find anything more than esophagitis but we can rule out other issues as a result of the thickening that was noted.  Certainly considered the potential of a colonoscopy but the patient's family wants to defer on this and talk specifically to the primary care doctor whom they have an appointment with later this week.  We will try to move forward with a diagnostic endoscopy tomorrow.  I have had an opportunity to speak with the patient's power of attorney whom also agrees that the patient's 3 daughters can make decisions on their fathers behalf but is happy to be  available at any time.  The risks and benefits of endoscopic evaluation were discussed with the patient; these include but are not limited to the risk of perforation, infection, bleeding, missed lesions, lack of diagnosis, severe illness requiring hospitalization, as well as anesthesia and sedation related illnesses.  The POA and family are agreeable to proceed.    PLAN/RECOMMENDATIONS  N.p.o. at midnight Tentative plan for EGD on 12/5 -Have discussed case with POA Catalina Antigua who agrees with endoscopic evaluation being performed and has already given paperwork that allows the 3 daughters to help make decisions for their father but he is happy to still be available for any discussions or consents as necessary Holding on colonoscopy per patient family desires Anticoagulation restart as per medicine service and cardiology and patient's family but for now being held Appreciate palliative care and cardiology and medical service for care of this patient  Please page/call with questions or concerns.   Corliss Parish, MD [facility] Office # [phone]    LOS: 4 days  Lemar Lofty  02/04/2020, 6:34 PM   700PM Addendum Spoke with family and patient again.  Will plan for EGD/Flexible Sigmoidoscopy tomorrow.  800AM tap water enema to be given.  Will help clarify anorectal source of potential bleeding as well.  Less risk that Colonoscopy.  Corliss Parish, MD  Gastroenterology Advanced Endoscopy Office # [phone]

## 2020-02-05 ENCOUNTER — Encounter (HOSPITAL_COMMUNITY): Payer: Self-pay | Admitting: Internal Medicine

## 2020-02-05 ENCOUNTER — Inpatient Hospital Stay (HOSPITAL_COMMUNITY): Payer: Medicare Other | Admitting: Critical Care Medicine

## 2020-02-05 ENCOUNTER — Encounter (HOSPITAL_COMMUNITY)
Admission: EM | Disposition: A | Discharge status: Home / Self Care | Payer: Self-pay | Source: Home / Self Care | Attending: Internal Medicine

## 2020-02-05 DIAGNOSIS — R933 Abnormal findings on diagnostic imaging of other parts of digestive tract: Secondary | ICD-10-CM | POA: Diagnosis not present

## 2020-02-05 DIAGNOSIS — Z7901 Long term (current) use of anticoagulants: Secondary | ICD-10-CM | POA: Diagnosis not present

## 2020-02-05 DIAGNOSIS — K922 Gastrointestinal hemorrhage, unspecified: Secondary | ICD-10-CM | POA: Diagnosis not present

## 2020-02-05 DIAGNOSIS — K222 Esophageal obstruction: Secondary | ICD-10-CM | POA: Diagnosis not present

## 2020-02-05 DIAGNOSIS — K921 Melena: Secondary | ICD-10-CM | POA: Diagnosis not present

## 2020-02-05 DIAGNOSIS — K5731 Diverticulosis of large intestine without perforation or abscess with bleeding: Secondary | ICD-10-CM | POA: Diagnosis not present

## 2020-02-05 DIAGNOSIS — K219 Gastro-esophageal reflux disease without esophagitis: Secondary | ICD-10-CM | POA: Diagnosis not present

## 2020-02-05 DIAGNOSIS — K209 Esophagitis, unspecified without bleeding: Secondary | ICD-10-CM | POA: Diagnosis not present

## 2020-02-05 HISTORY — PX: FLEXIBLE SIGMOIDOSCOPY: SHX5431

## 2020-02-05 HISTORY — PX: ESOPHAGOGASTRODUODENOSCOPY (EGD) WITH PROPOFOL: SHX5813

## 2020-02-05 HISTORY — PX: SAVORY DILATION: SHX5439

## 2020-02-05 HISTORY — PX: BIOPSY: SHX5522

## 2020-02-05 LAB — CBC WITH DIFFERENTIAL/PLATELET
Abs Immature Granulocytes: 0.03 10*3/uL (ref 0.00–0.07)
Basophils Absolute: 0 10*3/uL (ref 0.0–0.1)
Basophils Relative: 0 %
Eosinophils Absolute: 0.2 10*3/uL (ref 0.0–0.5)
Eosinophils Relative: 4 %
HCT: 28 % — ABNORMAL LOW (ref 39.0–52.0)
Hemoglobin: 9.2 g/dL — ABNORMAL LOW (ref 13.0–17.0)
Immature Granulocytes: 1 %
Lymphocytes Relative: 26 %
Lymphs Abs: 1.3 10*3/uL (ref 0.7–4.0)
MCH: 26.1 pg (ref 26.0–34.0)
MCHC: 32.9 g/dL (ref 30.0–36.0)
MCV: 79.3 fL — ABNORMAL LOW (ref 80.0–100.0)
Monocytes Absolute: 0.6 10*3/uL (ref 0.1–1.0)
Monocytes Relative: 12 %
Neutro Abs: 2.9 10*3/uL (ref 1.7–7.7)
Neutrophils Relative %: 57 %
Platelets: 153 10*3/uL (ref 150–400)
RBC: 3.53 MIL/uL — ABNORMAL LOW (ref 4.22–5.81)
RDW: 15.2 % (ref 11.5–15.5)
WBC: 5.1 10*3/uL (ref 4.0–10.5)
nRBC: 0 % (ref 0.0–0.2)

## 2020-02-05 LAB — COMPREHENSIVE METABOLIC PANEL
ALT: 21 U/L (ref 0–44)
AST: 19 U/L (ref 15–41)
Albumin: 3.1 g/dL — ABNORMAL LOW (ref 3.5–5.0)
Alkaline Phosphatase: 82 U/L (ref 38–126)
Anion gap: 8 (ref 5–15)
BUN: 7 mg/dL — ABNORMAL LOW (ref 8–23)
CO2: 28 mmol/L (ref 22–32)
Calcium: 8.5 mg/dL — ABNORMAL LOW (ref 8.9–10.3)
Chloride: 104 mmol/L (ref 98–111)
Creatinine, Ser: 1.09 mg/dL (ref 0.61–1.24)
GFR, Estimated: >60 mL/min (ref >60)
Glucose, Bld: 110 mg/dL — ABNORMAL HIGH (ref 70–99)
Potassium: 2.9 mmol/L — ABNORMAL LOW (ref 3.5–5.1)
Sodium: 140 mmol/L (ref 135–145)
Total Bilirubin: 1 mg/dL (ref 0.3–1.2)
Total Protein: 5.7 g/dL — ABNORMAL LOW (ref 6.5–8.1)

## 2020-02-05 LAB — PROTIME-INR
INR: 1.2 (ref 0.8–1.2)
Prothrombin Time: 14.3 seconds (ref 11.4–15.2)

## 2020-02-05 SURGERY — ESOPHAGOGASTRODUODENOSCOPY (EGD) WITH PROPOFOL
Anesthesia: Monitor Anesthesia Care

## 2020-02-05 MED ORDER — POTASSIUM CHLORIDE 10 MEQ/100ML IV SOLN
10.0000 meq | INTRAVENOUS | Status: AC
  Administered 2020-02-05 (×2): 10 meq via INTRAVENOUS
  Filled 2020-02-05 (×2): qty 100

## 2020-02-05 MED ORDER — PROPOFOL 500 MG/50ML IV EMUL
INTRAVENOUS | Status: DC | PRN
  Administered 2020-02-05: 100 ug/kg/min via INTRAVENOUS

## 2020-02-05 MED ORDER — LACTATED RINGERS IV SOLN: INTRAVENOUS | Status: DC | PRN

## 2020-02-05 MED ORDER — MELATONIN 3 MG PO TABS
6.0000 mg | ORAL_TABLET | Freq: Every day | ORAL | Status: DC
  Administered 2020-02-05: 6 mg via ORAL
  Filled 2020-02-05: qty 2

## 2020-02-05 MED ORDER — POTASSIUM CHLORIDE 20 MEQ PO PACK
40.0000 meq | PACK | Freq: Once | ORAL | Status: AC
  Administered 2020-02-05: 40 meq via ORAL
  Filled 2020-02-05: qty 2

## 2020-02-05 MED ORDER — LIDOCAINE 2% (20 MG/ML) 5 ML SYRINGE
INTRAMUSCULAR | Status: DC | PRN
  Administered 2020-02-05: 20 mg via INTRAVENOUS

## 2020-02-05 MED ORDER — POTASSIUM CHLORIDE 20 MEQ PO PACK: 40.0000 meq | PACK | Freq: Once | ORAL | Status: DC

## 2020-02-05 MED ORDER — PROPOFOL 10 MG/ML IV BOLUS
INTRAVENOUS | Status: DC | PRN
  Administered 2020-02-05 (×6): 10 mg via INTRAVENOUS

## 2020-02-05 MED ORDER — ALBUMIN HUMAN 5 % IV SOLN
12.5000 g | Freq: Once | INTRAVENOUS | Status: AC
  Administered 2020-02-05: 12.5 g via INTRAVENOUS

## 2020-02-05 MED ORDER — ALBUMIN HUMAN 5 % IV SOLN
INTRAVENOUS | Status: AC
  Filled 2020-02-05: qty 250

## 2020-02-05 MED ORDER — MELATONIN 3 MG PO TABS
6.0000 mg | ORAL_TABLET | Freq: Every day | ORAL | 0 refills | Status: DC
Start: 2020-02-05 — End: 2020-11-13

## 2020-02-05 MED ORDER — PANTOPRAZOLE SODIUM 40 MG PO TBEC
40.0000 mg | DELAYED_RELEASE_TABLET | Freq: Two times a day (BID) | ORAL | 0 refills | Status: DC
Start: 2020-02-05 — End: 2020-03-08

## 2020-02-05 SURGICAL SUPPLY — 15 items

## 2020-02-05 NOTE — Transfer of Care (Signed)
Immediate Anesthesia Transfer of Care Note  Patient: Ryan [name]  Procedure(s) Performed: ESOPHAGOGASTRODUODENOSCOPY (EGD) WITH PROPOFOL (N/A ) FLEXIBLE SIGMOIDOSCOPY (N/A ) BIOPSY SAVORY DILATION (N/A )  Patient Location: Endoscopy Unit  Anesthesia Type:MAC  Level of Consciousness: sedated  Airway & Oxygen Therapy: Patient Spontanous Breathing and Patient connected to nasal cannula oxygen  Post-op Assessment: Report given to RN and Post -op Vital signs reviewed and stable  Post vital signs: Reviewed and stable  Last Vitals:  Vitals Value Taken Time  BP 105/61 02/05/20 1129  Temp    Pulse 82 02/05/20 1130  Resp 16 02/05/20 1130  SpO2 100 % 02/05/20 1130  Vitals shown include unvalidated device data.  Last Pain:  Vitals:   02/05/20 0938  TempSrc: Oral  PainSc: 0-No pain      Patients Stated Pain Goal: 2 (67/89/38 1017)  Complications: No complications documented.

## 2020-02-05 NOTE — Interval H&P Note (Signed)
History and Physical Interval Note:  02/05/2020 10:27 AM  Ryan [name]  has presented today for surgery, with the diagnosis of anemia.  bloody stool..  The various methods of treatment have been discussed with the patient and family. After consideration of risks, benefits and other options for treatment, the patient has consented to  Procedure(s): ESOPHAGOGASTRODUODENOSCOPY (EGD) WITH PROPOFOL (N/A) FLEXIBLE SIGMOIDOSCOPY (N/A) as a surgical intervention.  The patient's history has been reviewed, patient examined, no change in status, stable for surgery.  I have reviewed the patient's chart and labs.  Questions were answered to the patient's satisfaction.     [employer]

## 2020-02-05 NOTE — Op Note (Signed)
[facility] Patient Name: Ryan [name] Procedure Date : 02/05/2020 MRN: [id number] Attending MD: Corliss Parish , MD Date of Birth: 1934/06/29 CSN: [id number] Age: 85 Admit Type: Inpatient Procedure:                Upper GI endoscopy Indications:              Dysphagia (possible), Heartburn, Gastro-esophageal                            reflux disease, Regurgitation, Belching Providers:                Corliss Parish, MD, Glory Rosebush, RN, Brion Aliment, Technician, Rozetta Nunnery, Technician Referring MD:             Sunday Spillers. Stone[place] MD, MD, Vida Rigger, MD, [facility]                            [facility] Medicines:                Monitored Anesthesia Care Complications:            No immediate complications. Estimated Blood Loss:     Estimated blood loss was minimal. Procedure:                Pre-Anesthesia Assessment:                           - Prior to the procedure, a History and Physical                            was performed, and patient medications and                            allergies were reviewed. The patient's tolerance of                            previous anesthesia was also reviewed. The risks                            and benefits of the procedure and the sedation                            options and risks were discussed with the patient.                            All questions were answered, and informed consent                            was obtained. Prior Anticoagulants: The patient has                            taken Eliquis (apixaban), last dose was 5 days  prior to procedure. ASA Grade Assessment: III - A                            patient with severe systemic disease. After                            reviewing the risks and benefits, the patient was                            deemed in satisfactory condition to undergo the                            procedure.                            After obtaining informed consent, the endoscope was                            passed under direct vision. Throughout the                            procedure, the patient's blood pressure, pulse, and                            oxygen saturations were monitored continuously. The                            [id number]-H190 ([id number]) Olympus gastroscope was                            introduced through the mouth, and advanced to the                            third part of duodenum. The upper GI endoscopy was                            accomplished without difficulty. The patient                            tolerated the procedure. Scope In: Scope Out: Findings:      No gross lesions were noted in the proximal esophagus and in the mid       esophagus.      LA Grade A (one or more mucosal breaks less than 5 mm, not extending       between tops of 2 mucosal folds) esophagitis was found in the distal       esophagus and GE Junction.      Biopsies were taken with a cold forceps in the entire esophagus for       histology to rule out EoE/LoE. After the completion of the rest of the       EGD, a guidewire was placed and the scope was withdrawn. Dilation was       performed in the entire esophagus with a Savary dilator with moderate       resistance at 17 mm. The dilation site was  examined following endoscope       reinsertion and showed moderate mucosal disruption, significant       improvement in luminal narrowing and no perforation.      A widely patent and non-obstructing Schatzki ring was found at the       gastroesophageal junction (small mucosal wrent noted in this region as       well).      The Z-line was irregular and was found 36 cm from the incisors.      A 4 cm hiatal hernia was present.      Patchy mildly erythematous mucosa without bleeding was found in the       entire examined stomach. Biopsies were taken with a cold forceps for       histology and Helicobacter pylori testing.       No gross lesions were noted in the duodenal bulb, in the first portion       of the duodenum and in the second portion of the duodenum. Biopsies were       taken with a cold forceps for histology. Impression:               - No gross lesions in esophagus proximally. LA                            Grade A esophagitis distally.                           - Widely patent and non-obstructing Schatzki ring.                           - Z-line irregular, 36 cm from the incisors.                           - Esophagus biopsied. Esophagus dilated with                            evidence of mucosal wrents below UES and at                            Schatzki ring                           - 4 cm hiatal hernia.                           - Erythematous mucosa in the stomach. Biopsied.                           - No gross lesions in the duodenal bulb, in the                            first portion of the duodenum and in the second                            portion of the duodenum. Biopsied. Recommendation:           - Proceed to scheduled Flexible Sigmoidoscopy.                           -  Await pathology results.                           - Omeprazole or Protonix 40 mg twice daily.                            Continue this for 53-months. Then may decrease to 40                            mg daily.                           - Although only Grade A esophagitis, could consider                            repeat EGD in 15-months to ensure healing of                            esophagitis vs monitoring based on symptoms.                           - Observe patient's clinical course.                           - Anticoagulation discussion as per Flexible                            sigmoidoscopy notation.                           - The findings and recommendations were discussed                            with the patient's family.                           - The findings and recommendations were discussed                             with the Hospital Team. Procedure Code(s):        --- Professional ---                           [phone], Esophagogastroduodenoscopy, flexible,                            transoral; with insertion of guide wire followed by                            passage of dilator(s) through esophagus over guide                            wire Diagnosis Code(s):        --- Professional ---  K20.90, Esophagitis, unspecified without bleeding                           K22.2, Esophageal obstruction                           K22.8, Other specified diseases of esophagus                           K44.9, Diaphragmatic hernia without obstruction or                            gangrene                           K31.89, Other diseases of stomach and duodenum                           R13.10, Dysphagia, unspecified                           R12, Heartburn                           K21.9, Gastro-esophageal reflux disease without                            esophagitis                           R11.10, Vomiting, unspecified CPT copyright 2019 American Medical Association. All rights reserved. The codes documented in this report are preliminary and upon coder review may  be revised to meet current compliance requirements. Corliss Parish, MD 02/05/2020 3:08:19 PM Number of Addenda: 0

## 2020-02-05 NOTE — Progress Notes (Signed)
The procedure notes (EGD and flexible sigmoidoscopy) can be found in the procedure notes tab. Follow-up with the [facility] will not be set up because it is not clear that he will end up needing follow-up.  Upon discharge from this hospitalization, should the patient become rehospitalized, the patient's family must be contacted in effort of knowing which GI group he will be following with.  He has been established previously with [facility] but during this hospitalization a second opinion was requested for which [facility] was able to provide services.  It is not clear that the family knows who they would follow up with in the future (if needed), so default would be [facility] unless the family clarifies specifically that they will be following with [facility].  We will also need to follow the rules of rehospitalization if he does not end up following at [facility] in the outpatient setting within the next 8-weeks.  Corliss Parish, MD  Gastroenterology Advanced Endoscopy Office # [phone]

## 2020-02-05 NOTE — Progress Notes (Signed)
PROGRESS NOTE  Ryan [name] XIH:[id number] DOB: [birth year] DOA: 01/30/2020 PCP: [name], MD  Brief History   Patient is 85 year old male with past medical history of A. fib on Eliquis, hyperlipidemia, BPH, Alzheimer's dementia presents to emergency department with bright red blood per rectum.  Upon arrival to ED: Patient was found to have hemoglobin of 10.3.  EDP revealed that patient had notable dark blood with clots in his adult diaper on arrival.  Patient was given 40 mg of IV Protonix as well as 20 mg of IV Pepcid.  Patient was then initiated on IV fluids.  GI consulted.  Patient admitted for further evaluation and management of acute blood loss anemia secondary to lower GI bleed.  [facility] declined to perform upper GI or colonoscopy due to the patient's poor functional status. The patient's family requested a second opinion. [facility] was consulted to evaluate the patient. They have offered EGD and colonoscopy. Family is considering whether they wish to go forward with this plan. They wish to talk with GI regarding the risks and benefits of the endoscopic work up. GI wishes for this to be addressed through palliative care. Palliative care has also been consulted based upon cardiology's recommendation as well as GI recommendation.  Palliative care was consulted to assist with determination of family wishes with regard to EGD or colonoscopy. They decided to go forward with EGD, but not colonoscopy.  Dr. Meridee Score took the patient for EGD today. He was found to have primarily normal mucosa in the esophagus, but with tortuosity at the distal end. There was a Schatzki ring at the GE junction that was dilated. There was also esophagitis, a hiatal hernia, and gastritis. The duodenum was normal. Biopsies taking. Flex sig demonstrated diverticulosis, and the descending colon and hemorrhoids. Biopsies taken.  The patient was returned to his room on a liquid diet. This will be advanced to a soft  dysphagia diet. If the patient tolerates well, he may be discharged to home tomorrow.  Consultants  . Gastroenterology . Cardiology  Procedures  . None  Antibiotics   Anti-infectives (From admission, onward)   None      Subjective  The patient is resting comfortably. No new complaints.  Objective   Vitals:  Vitals:   02/05/20 1208 02/05/20 1215  BP: (!) 148/75 133/88  Pulse: 67 65  Resp: 15 17  Temp:    SpO2: 98% 98%   Exam:  Constitutional:  . The patient is awake and alert. No new complaints. Respiratory:  . No increased work of breathing. . No wheezes, rales, or rhonchi . No tactile fremitus Cardiovascular:  . Regular rate and rhythm . No murmurs, ectopy, or gallups. . No lateral PMI. No thrills. Abdomen:  . Abdomen is soft, non-tender, non-distended . No hernias, masses, or organomegaly . Normoactive bowel sounds.  Musculoskeletal:  . No cyanosis, clubbing, or edema Skin:  . No rashes, lesions, ulcers . palpation of skin: no induration or nodules Neurologic:  . CN 2-12 intact . Sensation all 4 extremities intact  I have personally reviewed the following:   Today's Data  . Vitals, BMP, CBC  Scheduled Meds: . atorvastatin  40 mg Oral Daily  . melatonin  3 mg Oral QHS  . pantoprazole  40 mg Oral BID  . tamsulosin  0.4 mg Oral Daily   Principal Problem:   Anemia due to acute blood loss Active Problems:   Acute GI bleeding   Mixed hyperlipidemia   Benign prostatic hyperplasia  AF (paroxysmal atrial fibrillation) (HCC)   GI bleed   Lower GI bleed   Primary hypertension   LOS: 5 days   A & P  Acute blood loss anemia/symptomatic anemia:Likely in the setting of acute lower GI bleed/diverticular bleed. Patient presented with bright red blood per rectum. Hemoglobin dropped from 10.3-9.9-8.7-8.9. He is receiving IV PPI twice daily. Evaluated by [facility] recommended CTA abdomen to rule out mass lesion or active bleeding given patient underlying  advanced dementia-thought he would not be able to complete/tolerate prep at this time. Reviewed CT abdomen-shows diverticuli-no mass lesion or active bleeding noted. Family requested second opinion.  Consulted [facility] & discussed with PA Ryan [name]. They have offered EGD and Colonoscopy. The patient's family is considering whether they wish to go forward. Monitor H&H closely and transfuse if hemoglobin less than 7.Continue to hold Eliquis. H&H is 9.3 today. Dr. Meridee Score took the patient for EGD today. He was found to have primarily normal mucosa in the esophagus, but with tortuosity at the distal end. There was a Schatzki ring at the GE junction that was dilated. There was also esophagitis, a hiatal hernia, and gastritis. The duodenum was normal. Biopsies taking. Flex sig demonstrated diverticulosis, and the descending colon and hemorrhoids. Biopsies taken. The patient was returned to his room on a liquid diet. This will be advanced to a soft dysphagia diet. If the patient tolerates well, he may be discharged to home tomorrow.  Paroxysmal A. fib: Rate controlled. CHA2DS2-VASc score: 3 Continue to hold Eliquis.  Not on AV nodal blocking agents at home. Continue to monitor heart rate closely on telemetry. Consulted cardiology-recommended 2 options.  Proceeding with colonoscopy and if no source of bleeding found then restart Eliquis or stop Eliquis permanently given advanced Alzheimer's dementia. Palliative care has been consulted. Family is considering DNR status, but want to discuss this with PCP first.  Hypokalemia: Supplemented. Monitor.  Thrombocytopenia: Platelet 145. Monitor  Hyperlipidemia: Continue statin  BPH: Continue Flomax  Advanced Alzheimer's dementia: Without behavioral changes. Supportive care.  Left thigh/wound: Wound care consulted-appreciate input.  Inguinal hernia: Small, nonobstructive, fat-containing.  Noted on CT abdomen.  Goals of care: Palliative care  consulted. Patient is from [place] and currently living with his daughters who are taking care of him.  Apparently there has recently been a custody battle between the patient's wife and 3 daughters.  To resolve this issue patient has been appointed a guardian by the name of Ryan [name].  On admission-[name] was contacted by admitting physician who agreed with blood transfusion and endoscopic intervention if necessary.  I have seen and examined this patient myself. I have spent 30 minutes in his evaluation and care.  DVT prophylaxis: SCD Code Status: Full code Family Communication: None available. Disposition Plan: Home in 1 to 2 days   Shenekia Riess, DO [facility] Direct contact: see [email or web address]  7PM-7AM contact night coverage as above 02/05/2020, 2:24 PM  LOS: 2 days

## 2020-02-05 NOTE — Anesthesia Preprocedure Evaluation (Signed)
Anesthesia Evaluation  Patient identified by MRN, date of birth, ID band Patient awake    Reviewed: Allergy & Precautions, H&P , NPO status , Patient's Chart, lab work & pertinent test results  Airway Mallampati: II   Neck ROM: full    Dental   Pulmonary neg pulmonary ROS,    breath sounds clear to auscultation       Cardiovascular hypertension, + dysrhythmias Atrial Fibrillation  Rhythm:regular Rate:Normal  On eliquis   Neuro/Psych PSYCHIATRIC DISORDERS Dementia    GI/Hepatic GI bleeding   Endo/Other    Renal/GU      Musculoskeletal   Abdominal   Peds  Hematology  (+) Blood dyscrasia, anemia ,   Anesthesia Other Findings   Reproductive/Obstetrics                             Anesthesia Physical Anesthesia Plan  ASA: III  Anesthesia Plan: MAC   Post-op Pain Management:    Induction: Intravenous  PONV Risk Score and Plan: 1 and Propofol infusion and Treatment may vary due to age or medical condition  Airway Management Planned: Nasal Cannula  Additional Equipment:   Intra-op Plan:   Post-operative Plan:   Informed Consent: I have reviewed the patients History and Physical, chart, labs and discussed the procedure including the risks, benefits and alternatives for the proposed anesthesia with the patient or authorized representative who has indicated his/her understanding and acceptance.       Plan Discussed with: CRNA, Anesthesiologist and Surgeon  Anesthesia Plan Comments:         Anesthesia Quick Evaluation

## 2020-02-05 NOTE — Op Note (Signed)
[facility] Patient Name: Ryan [name] Procedure Date : 02/05/2020 MRN: [id number] Attending MD: Justice Britain , MD Date of Birth: 10/06/1934 CSN: [id number] Age: 85 Admit Type: Inpatient Procedure:                Flexible Sigmoidoscopy Indications:              Hematochezia, Diverticula, Follow-up of                            diverticula, New-onset diarrhea Providers:                Justice Britain, MD, Glori Bickers, RN, Ladona Ridgel, Technician, Benetta Spar, Technician Referring MD:             Adelfa Koh. Stoneking MD, MD, Clarene Essex, MD, [facility] Medicines:                Monitored Anesthesia Care Complications:            No immediate complications. Estimated Blood Loss:     Estimated blood loss was minimal. Procedure:                Pre-Anesthesia Assessment:                           - Prior to the procedure, a History and Physical                            was performed, and patient medications and                            allergies were reviewed. The patient's tolerance of                            previous anesthesia was also reviewed. The risks                            and benefits of the procedure and the sedation                            options and risks were discussed with the patient.                            All questions were answered, and informed consent                            was obtained. Prior Anticoagulants: The patient has                            taken Eliquis (apixaban), last dose was 5 days  prior to procedure. ASA Grade Assessment: III - A                            patient with severe systemic disease. After                            reviewing the risks and benefits, the patient was                            deemed in satisfactory condition to undergo the                            procedure.                           After  obtaining informed consent, the scope was                            passed under direct vision. The [id number]-[id number] ([id number])                            Olympus gastroscope was introduced through the anus                            and advanced to the the descending colon. The                            flexible sigmoidoscopy was accomplished without                            difficulty. The patient tolerated the procedure.                            The quality of the bowel preparation was poor. Scope In: 11:10:25 AM Scope Out: 11:20:48 AM Total Procedure Duration: 0 hours 10 minutes 23 seconds  Findings:      The digital rectal exam findings include hemorrhoids. Pertinent       negatives include no palpable rectal lesions.      Extensive amounts of semi-solid solid stool was found in the entire       colon, interfering with visualization. Lavage of the area was performed       using copious amounts, resulting in incomplete clearance with fair       visualization.      Many small and large-mouthed diverticula were found in the recto-sigmoid       colon and sigmoid colon.      Normal mucosa was found in the entire colon. Biopsies for histology were       taken with a cold forceps from the left colon and rectum for evaluation       of microscopic colitis.      Non-bleeding non-thrombosed external and internal hemorrhoids were found       during retroflexion, during perianal exam and during digital exam. The       hemorrhoids were Grade II (internal hemorrhoids that prolapse but reduce       spontaneously). Impression:               -  Preparation of the colon was poor even after                            extensive lavage.                           - Hemorrhoids found on digital rectal exam.                           - Diverticulosis in the recto-sigmoid colon and in                            the sigmoid colon.                           - Normal mucosa in the entire examined colon was                             visualized. Biopsied.                           - Non-bleeding non-thrombosed external and internal                            hemorrhoids.                           - Suspect that recent hematochezia and anemia is                            likely diverticular in origin but the left colon                            was not visualized, so a malignancy or other lesion                            could also be present. However, at current the                            colon is entirely filled with brown stool and solid                            stools, so there is no active bleeding. Should                            self-limited bleeding occur, suspect it will be                            hemorrhoidal in nature, but if bleeding is                            persistent then may require repeated evaluation in                            the  Inaptient setting. Colonoscopy had been                            offered, but deferred due to risks and concerns by                            the family. Recommendation:           - The patient will be observed post-procedure,                            until all discharge criteria are met.                           - Return patient to hospital ward for ongoing care.                           - Liquid diet for 4 hours then advancement to soft                            diet.                           - Await pathology results.                           - Patient and family will be discussing case with                            PCP in the coming days to consider role of                            restarting anticoagulation as well as consideration                            of need for full colonoscopy.                           - Would not restart NOAC for at least 72 hours due                            to esophageal dilation. If anticoagulation were                            needed urgently could restart at 600PM on 12/5 with                             heparin drip no bolus.                           - Preparation H 2-3 times daily to be                           - May consider Anusol QHS x 14 days if necessary to  also aid in issues of rectal bleeding that is                            self-limited.                           - Sitz bathes QHS recommended.                           - The findings and recommendations were discussed                            with the patient's family.                           - The findings and recommendations were discussed                            with the referring physician. Procedure Code(s):        --- Professional ---                           [phone], Sigmoidoscopy, flexible; with biopsy, single                            or multiple Diagnosis Code(s):        --- Professional ---                           K64.1, Second degree hemorrhoids                           K57.30, Diverticulosis of large intestine without                            perforation or abscess without bleeding                           K92.1, Melena (includes Hematochezia) CPT copyright 2019 [employer]. All rights reserved. The codes documented in this report are preliminary and upon coder review may  be revised to meet current compliance requirements. Justice Britain, MD 02/05/2020 3:21:41 PM Number of Addenda: 0

## 2020-02-05 NOTE — Anesthesia Procedure Notes (Signed)
Procedure Name: MAC Date/Time: 02/05/2020 10:45 AM Performed by: Wilburn Cornelia, CRNA Pre-anesthesia Checklist: Patient identified, Emergency Drugs available, Suction available, Patient being monitored and Timeout performed Patient Re-evaluated:Patient Re-evaluated prior to induction Oxygen Delivery Method: Nasal cannula Placement Confirmation: positive ETCO2 and breath sounds checked- equal and bilateral Dental Injury: Teeth and Oropharynx as per pre-operative assessment

## 2020-02-06 DIAGNOSIS — D62 Acute posthemorrhagic anemia: Secondary | ICD-10-CM | POA: Diagnosis not present

## 2020-02-06 LAB — BASIC METABOLIC PANEL
Anion gap: 8 (ref 5–15)
BUN: 9 mg/dL (ref 8–23)
CO2: 23 mmol/L (ref 22–32)
Calcium: 8.1 mg/dL — ABNORMAL LOW (ref 8.9–10.3)
Chloride: 107 mmol/L (ref 98–111)
Creatinine, Ser: 1.14 mg/dL (ref 0.61–1.24)
GFR, Estimated: >60 mL/min (ref >60)
Glucose, Bld: 113 mg/dL — ABNORMAL HIGH (ref 70–99)
Potassium: 3.2 mmol/L — ABNORMAL LOW (ref 3.5–5.1)
Sodium: 138 mmol/L (ref 135–145)

## 2020-02-06 LAB — CBC
HCT: 27.2 % — ABNORMAL LOW (ref 39.0–52.0)
Hemoglobin: 8.9 g/dL — ABNORMAL LOW (ref 13.0–17.0)
MCH: 26.1 pg (ref 26.0–34.0)
MCHC: 32.7 g/dL (ref 30.0–36.0)
MCV: 79.8 fL — ABNORMAL LOW (ref 80.0–100.0)
Platelets: 161 10*3/uL (ref 150–400)
RBC: 3.41 MIL/uL — ABNORMAL LOW (ref 4.22–5.81)
RDW: 15.6 % — ABNORMAL HIGH (ref 11.5–15.5)
WBC: 7.4 10*3/uL (ref 4.0–10.5)
nRBC: 0 % (ref 0.0–0.2)

## 2020-02-06 MED ORDER — FERROUS SULFATE 325 (65 FE) MG PO TBEC: 325.0000 mg | DELAYED_RELEASE_TABLET | Freq: Two times a day (BID) | ORAL | 11 refills | Status: DC

## 2020-02-06 MED ORDER — POTASSIUM CHLORIDE CRYS ER 20 MEQ PO TBCR
40.0000 meq | EXTENDED_RELEASE_TABLET | Freq: Two times a day (BID) | ORAL | Status: DC
  Administered 2020-02-06: 40 meq via ORAL
  Filled 2020-02-06: qty 2

## 2020-02-06 MED ORDER — POTASSIUM CHLORIDE CRYS ER 20 MEQ PO TBCR: 20.0000 meq | EXTENDED_RELEASE_TABLET | Freq: Every day | ORAL | 0 refills | Status: DC

## 2020-02-06 NOTE — Discharge Summary (Signed)
Physician Discharge Summary  Ryan [name] ZOX:[id number] DOB: 1934/08/01 DOA: 01/30/2020  PCP: [name], MD  Admit date: 01/30/2020 Discharge date: 02/06/2020  Admitted From: Home Disposition: Home  Recommendations for Outpatient Follow-up:  1. Follow up with PCP in 1-2 weeks 2. Repeat CBC in 1 week 3. Continue to hold Eliquis  Home Health: Not applicable Equipment/Devices: Not needed  Discharge Condition: Stable CODE STATUS: Full code Diet recommendation: Low-salt diet  Discharge summary:  85 year old gentleman with history of dementia, additional A. fib on Eliquis, hyperlipidemia, BPH and history of diverticulosis presented to the ER with bright red blood per rectum.  Upon arrival to the ER he was hemodynamically stable.  His hemoglobin was 10.3, no baseline hemoglobin available.  Patient had just moved from [place].  He was admitted to the hospital.  Clinical evidence of diverticulosis, initially GI recommended conservative management.  Patient's family asked for second opinion and was seen by [facility] and ultimately underwent upper GI endoscopy and quick prep sigmoidoscopy. Upper GI endoscopy with normal mucosa, tortuosity but no bleeding.  There was some evidence of esophagitis and gastritis.  Flex sigmoidoscopy with diverticulosis, nonbleeding hemorrhoids.  Patient was monitored in the hospital after the procedure and had clear bowel movement without evidence of bleeding. Hemoglobin 10.1-9.8-8.9-9.2.  Mostly remained stable.  Today patient without evidence of ongoing bleeding.  Plan: Discharging home with PPI twice a day, patient family preferred to follow-up with [facility] so outpatient referral was sent. No evidence of frank bleeding, cardiology recommended to stop Eliquis for now.  Patient will stop taking Eliquis until outpatient follow-up. Report was sent to cardiology for follow-up, may discuss about benefits versus risks of continuing Eliquis. Family also interested  to have palliative care discussion, will be seen at home by [facility]. Patient remains fairly stable today. Going home with family. Potassium was low that was replaced, also discharged on 3 more days of KCl 20 mEq daily.   Discharge Diagnoses:  Principal Problem:   Anemia due to acute blood loss Active Problems:   Acute GI bleeding   Mixed hyperlipidemia   Benign prostatic hyperplasia   AF (paroxysmal atrial fibrillation) (HCC)   GI bleed   Lower GI bleed   Primary hypertension    Discharge Instructions  Discharge Instructions    Ambulatory referral to Cardiology   Complete by: As directed    Hospital follow up   Ambulatory referral to Gastroenterology   Complete by: As directed    Hospital follow up. Patient wants to follow up with [facility]   Call MD for:   Complete by: As directed    Excessive bleeding   Call MD for:  extreme fatigue   Complete by: As directed    Diet - low sodium heart healthy   Complete by: As directed    Increase activity slowly   Complete by: As directed    No wound care   Complete by: As directed      Allergies as of 02/06/2020   No Known Allergies     Medication List    STOP taking these medications   apixaban 5 MG Tabs tablet Commonly known as: ELIQUIS     TAKE these medications   atorvastatin 40 MG tablet Commonly known as: LIPITOR Take 40 mg by mouth daily.   ferrous sulfate 325 (65 FE) MG EC tablet Take 1 tablet (325 mg total) by mouth 2 (two) times daily.   melatonin 3 MG Tabs tablet Take 2 tablets (6 mg total) by  mouth at bedtime.   multivitamin tablet Take 1 tablet by mouth daily.   pantoprazole 40 MG tablet Commonly known as: PROTONIX Take 1 tablet (40 mg total) by mouth 2 (two) times daily.   potassium chloride SA 20 MEQ tablet Commonly known as: KLOR-CON Take 1 tablet (20 mEq total) by mouth daily for 3 days.   tamsulosin 0.4 MG Caps capsule Commonly known as: FLOMAX Take 0.4 mg by mouth daily after supper.        Follow-up Information    Stoneking, Hal, MD Follow up in 1 week(s).   Specialty: Internal Medicine Contact information: [address] [place] [address] [phone]              No Known Allergies  Consultations:  Gastroenterology   Procedures/Studies: DG UGI W SINGLE CM (SOL OR THIN BA)  Result Date: 02/02/2020 CLINICAL DATA:  Question of GERD, history of gastrointestinal bleeding EXAM: UPPER GI SERIES WITH KUB TECHNIQUE: After obtaining a scout radiograph a routine upper GI series was performed using thin barium FLUOROSCOPY TIME:  Fluoroscopy Time:  3 minutes 54 seconds Radiation Exposure Index (if provided by the fluoroscopic device): 24 mGy Number of Acquired Spot Images: 0 COMPARISON:  CT angiography of the abdomen and pelvis on January 31, 2020 FINDINGS: Scout radiograph of the abdomen with IVC filter projecting to the RIGHT of the spine and changes of LEFT hip arthroplasty. The visualized bowel gas pattern without signs of obstruction. Small volume stool in the rectum. Spinal degenerative changes. Post cholecystectomy with clips in the RIGHT upper quadrant. The study was limited due to difficulty with patient positioning. Also limited by the small amount of contrast could be ingested at any given time by the patient. Esophageal fold thickening and presumed dysmotility with tertiary peristaltic activity. Standard views could not be obtained due to difficulty in patient positioning to assess motility. Small hiatal hernia. Esophageal distensibility grossly normal. Stomach without gross abnormality, limited assessment on single contrast evaluation. Contrast flowed readily from the stomach into the proximal duodenum and beyond into the jejunum. Duodenal jejunal anatomy is normal with retroperitoneal course of the duodenum crossing the midline to the ligament of Treitz. IMPRESSION: Esophageal dysmotility, fold thickening and small hiatal hernia. Findings could  represent mild esophagitis. Grossly normal appearance of stomach and proximal small bowel as described. Limited due to volume of contrast could be ingested and secondary to patient positioning. Electronically Signed   By: Donzetta Kohut M.D.   On: 02/02/2020 09:17   CT Angio Abd/Pel w/ and/or w/o  Result Date: 01/31/2020 CLINICAL DATA:  Acute rectal bleeding since yesterday. EXAM: CT ANGIOGRAPHY ABDOMEN AND PELVIS WITH CONTRAST AND WITHOUT CONTRAST TECHNIQUE: Multidetector CT imaging of the abdomen and pelvis was performed using the standard protocol during bolus administration of intravenous contrast. Multiplanar reconstructed images and MIPs were obtained and reviewed to evaluate the vascular anatomy. CONTRAST:  OMNIPAQUE IOHEXOL 350 MG/ML SOLN COMPARISON:  None. FINDINGS: VASCULAR Aorta: Normally patent with no evidence of aneurysm or dissection. Mild calcified plaque in the distal aorta. Celiac: Normally patent origin. 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 Maximal diameter is approximately 10 mm. Distal branch vessels are normally patent. SMA: Normally patent. Renals: Bilateral single renal arteries demonstrate normal patency. IMA: Normally patent. Inflow: Bilateral iliac arteries demonstrates mild atherosclerosis without aneurysm or significant obstructive disease. Proximal Outflow: Normally patent common femoral arteries and femoral bifurcations. Veins: Venous phase imaging demonstrates normal  patency of visualized venous structures including mesenteric veins, the splenic vein, the portal vein, the IVC, iliac veins, common femoral veins and the renal veins. An IVC filter is in place in the infrarenal IVC and demonstrates no evidence of internal thrombus. There is some penetration some of the distal IVC filter legs posteriorly. Review of the MIP images confirms the above findings. NON-VASCULAR Lower chest: No acute abnormality.   Probable small hiatal hernia. Hepatobiliary: No focal liver abnormality is seen. Status post cholecystectomy. No biliary dilatation. Pancreas: Unremarkable. No pancreatic ductal dilatation or surrounding inflammatory changes. Spleen: No splenic injury or perisplenic hematoma. Adrenals/Urinary Tract: No adrenal masses. Bilateral simple renal cysts. No hydronephrosis or visualized renal calculi. The bladder demonstrates trabeculated appearance and multiple probable small diverticula. Stomach/Bowel: Bowel shows no evidence obstruction, ileus, inflammation or lesion. Diverticulosis noted involving much of the colon without acute diverticulitis. During arterial and venous phases of imaging, there is no evidence of active contrast extravasation into the gastrointestinal tract. Lymphatic: No enlarged abdominal or pelvic lymph nodes. Reproductive: Prostate is unremarkable. Other: Small left inguinal hernia contains fat. No abdominopelvic ascites. Musculoskeletal: No acute or significant osseous findings. IMPRESSION: 1. No evidence of active contrast extravasation into the gastrointestinal tract on arterial and venous phases of imaging. 2. Diverticulosis of the colon without evidence of acute diverticulitis. 3. 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 4. IVC filter in place in the infrarenal IVC with no evidence of internal thrombus. There is some penetration of some of the distal IVC filter legs posteriorly. 5. Trabeculated appearance of the bladder with multiple small diverticula. 6. Small left inguinal hernia containing fat. Aortic Atherosclerosis (ICD10-I70.0). Electronically Signed   By: Irish Lack M.D.   On: 01/31/2020 12:13    (Echo, Carotid, EGD, Colonoscopy, ERCP)    Subjective: Patient seen and examined.  Pleasant with cognitive impairment.  Denies any complaints.  2 daughters were at the bedside who did most of the talking.  Patient would just stay quiet and  listen.   Discharge Exam: Vitals:   02/06/20 0400 02/06/20 0722  BP: (!) 113/57 127/75  Pulse:  69  Resp:  16  Temp: 98 F (36.7 C) 97.9 F (36.6 C)  SpO2:  99%   Vitals:   02/05/20 2000 02/06/20 0000 02/06/20 0400 02/06/20 0722  BP: 136/80  (!) 113/57 127/75  Pulse:    69  Resp:    16  Temp: 99 F (37.2 C) 98.5 F (36.9 C) 98 F (36.7 C) 97.9 F (36.6 C)  TempSrc: Oral Oral Oral Oral  SpO2:    99%  Weight:      Height:        General: Pt is alert, awake, not in acute distress Oriented x1.  Pleasant to conversation. Cardiovascular: RRR, S1/S2 +, no rubs, no gallops Respiratory: CTA bilaterally, no wheezing, no rhonchi Abdominal: Soft, NT, ND, bowel sounds + Extremities: no edema, no cyanosis    The results of significant diagnostics from this hospitalization (including imaging, microbiology, ancillary and laboratory) are listed below for reference.     Microbiology: Recent Results (from the past 240 hour(s))  Resp Panel by RT-PCR (Flu A&B, Covid) Nasopharyngeal Swab     Status: None   Collection Time: 01/30/20  5:45 PM   Specimen: Nasopharyngeal Swab; Nasopharyngeal(NP) swabs in vial transport medium  Result Value Ref Range Status   SARS Coronavirus 2 by RT PCR NEGATIVE NEGATIVE Final    Comment: (NOTE) SARS-CoV-2 target  nucleic acids are NOT DETECTED.  The SARS-CoV-2 RNA is generally detectable in upper respiratory specimens during the acute phase of infection. The lowest concentration of SARS-CoV-2 viral copies this assay can detect is 138 copies/mL. A negative result does not preclude SARS-Cov-2 infection and should not be used as the sole basis for treatment or other patient management decisions. A negative result may occur with  improper specimen collection/handling, submission of specimen other than nasopharyngeal swab, presence of viral mutation(s) within the areas targeted by this assay, and inadequate number of viral copies(<138 copies/mL). A  negative result must be combined with clinical observations, patient history, and epidemiological information. The expected result is Negative.  Fact Sheet for Patients:  BloggerCourse.com  Fact Sheet for Healthcare Providers:  SeriousBroker.it  This test is no t yet approved or cleared by the [place] FDA and  has been authorized for detection and/or diagnosis of SARS-CoV-2 by FDA under an Emergency Use Authorization (EUA). This EUA will remain  in effect (meaning this test can be used) for the duration of the COVID-19 declaration under Section 564(b)(1) of the Act, 21 U.S.C.section 360bbb-3(b)(1), unless the authorization is terminated  or revoked sooner.       Influenza A by PCR NEGATIVE NEGATIVE Final   Influenza B by PCR NEGATIVE NEGATIVE Final    Comment: (NOTE) The Xpert Xpress SARS-CoV-2/FLU/RSV plus assay is intended as an aid in the diagnosis of influenza from Nasopharyngeal swab specimens and should not be used as a sole basis for treatment. Nasal washings and aspirates are unacceptable for Xpert Xpress SARS-CoV-2/FLU/RSV testing.  Fact Sheet for Patients: BloggerCourse.com  Fact Sheet for Healthcare Providers: SeriousBroker.it  This test is not yet approved or cleared by the [place] FDA and has been authorized for detection and/or diagnosis of SARS-CoV-2 by FDA under an Emergency Use Authorization (EUA). This EUA will remain in effect (meaning this test can be used) for the duration of the COVID-19 declaration under Section 564(b)(1) of the Act, 21 U.S.C. section 360bbb-3(b)(1), unless the authorization is terminated or revoked.  Performed at [facility], [address], [place], [place] [address]      Labs: BNP (last 3 results) No results for input(s): BNP in the last 8760 hours. Basic Metabolic Panel: Recent Labs  Lab 01/31/20 0500  02/01/20 0401 02/02/20 0042 02/05/20 0225 02/06/20 0138  NA 140 143 138 140 138  K 3.5 3.3* 3.5 2.9* 3.2*  CL 107 107 103 104 107  CO2 17* GLUCOSE 96 95 91 110* 113*  BUN 26* 13 11 7* 9  CREATININE 0.94 1.03 1.11 1.09 1.14  CALCIUM 8.5* 8.4* 8.4* 8.5* 8.1*  MG 2.0 1.9 1.9  --   --    Liver Function Tests: Recent Labs  Lab 01/30/20 1745 01/31/20 0500 02/05/20 0225  AST ALT ALKPHOS 64 62 82  BILITOT 0.7 1.0 1.0  PROT 6.5 6.0* 5.7*  ALBUMIN 3.7 3.3* 3.1*   No results for input(s): LIPASE, AMYLASE in the last 168 hours. No results for input(s): AMMONIA in the last 168 hours. CBC: Recent Labs  Lab 01/30/20 1745 01/31/20 0046 01/31/20 0500 01/31/20 1337 02/01/20 0401 02/02/20 0042 02/04/20 1101 02/05/20 0225 02/06/20 1019  WBC 7.7  --  6.6  --  4.8 5.3  --  5.1 7.4  NEUTROABS 5.9  --  4.4  --   --   --   --  2.9  --  HGB 10.3*   < > 9.9*   < > 8.7* 8.9* 9.3* 9.2* 8.9*  HCT 33.2*   < > 32.2*   < > 27.4* 28.5* 29.8* 28.0* 27.2*  MCV 82.2  --  83.4  --  80.6 81.0  --  79.3* 79.8*  PLT 184  --  160  --  145* 156  --  153 161   < > = values in this interval not displayed.   Cardiac Enzymes: No results for input(s): CKTOTAL, CKMB, CKMBINDEX, TROPONINI in the last 168 hours. BNP: Invalid input(s): POCBNP CBG: No results for input(s): GLUCAP in the last 168 hours. D-Dimer No results for input(s): DDIMER in the last 72 hours. Hgb A1c No results for input(s): HGBA1C in the last 72 hours. Lipid Profile No results for input(s): CHOL, HDL, LDLCALC, TRIG, CHOLHDL, LDLDIRECT in the last 72 hours. Thyroid function studies No results for input(s): TSH, T4TOTAL, T3FREE, THYROIDAB in the last 72 hours.  Invalid input(s): FREET3 Anemia work up No results for input(s): VITAMINB12, FOLATE, FERRITIN, TIBC, IRON, RETICCTPCT in the last 72 hours. Urinalysis No results found for: COLORURINE, APPEARANCEUR, LABSPEC, PHURINE, GLUCOSEU, HGBUR,  BILIRUBINUR, KETONESUR, PROTEINUR, UROBILINOGEN, NITRITE, LEUKOCYTESUR Sepsis Labs Invalid input(s): PROCALCITONIN,  WBC,  LACTICIDVEN Microbiology Recent Results (from the past 240 hour(s))  Resp Panel by RT-PCR (Flu A&B, Covid) Nasopharyngeal Swab     Status: None   Collection Time: 01/30/20  5:45 PM   Specimen: Nasopharyngeal Swab; Nasopharyngeal(NP) swabs in vial transport medium  Result Value Ref Range Status   SARS Coronavirus 2 by RT PCR NEGATIVE NEGATIVE Final    Comment: (NOTE) SARS-CoV-2 target nucleic acids are NOT DETECTED.  The SARS-CoV-2 RNA is generally detectable in upper respiratory specimens during the acute phase of infection. The lowest concentration of SARS-CoV-2 viral copies this assay can detect is 138 copies/mL. A negative result does not preclude SARS-Cov-2 infection and should not be used as the sole basis for treatment or other patient management decisions. A negative result may occur with  improper specimen collection/handling, submission of specimen other than nasopharyngeal swab, presence of viral mutation(s) within the areas targeted by this assay, and inadequate number of viral copies(<138 copies/mL). A negative result must be combined with clinical observations, patient history, and epidemiological information. The expected result is Negative.  Fact Sheet for Patients:  BloggerCourse.com  Fact Sheet for Healthcare Providers:  SeriousBroker.it  This test is no t yet approved or cleared by the [place] FDA and  has been authorized for detection and/or diagnosis of SARS-CoV-2 by FDA under an Emergency Use Authorization (EUA). This EUA will remain  in effect (meaning this test can be used) for the duration of the COVID-19 declaration under Section 564(b)(1) of the Act, 21 U.S.C.section 360bbb-3(b)(1), unless the authorization is terminated  or revoked sooner.       Influenza A by PCR NEGATIVE  NEGATIVE Final   Influenza B by PCR NEGATIVE NEGATIVE Final    Comment: (NOTE) The Xpert Xpress SARS-CoV-2/FLU/RSV plus assay is intended as an aid in the diagnosis of influenza from Nasopharyngeal swab specimens and should not be used as a sole basis for treatment. Nasal washings and aspirates are unacceptable for Xpert Xpress SARS-CoV-2/FLU/RSV testing.  Fact Sheet for Patients: BloggerCourse.com  Fact Sheet for Healthcare Providers: SeriousBroker.it  This test is not yet approved or cleared by the [place] FDA and has been authorized for detection and/or diagnosis of SARS-CoV-2 by FDA under an Emergency Use Authorization (EUA). This EUA  will remain in effect (meaning this test can be used) for the duration of the COVID-19 declaration under Section 564(b)(1) of the Act, 21 U.S.C. section 360bbb-3(b)(1), unless the authorization is terminated or revoked.  Performed at [facility], [address] [place], [place] [address]      Time coordinating discharge: 35 minutes  SIGNED:   Dorcas CarrowKuber Ghimire, MD  [facility] 02/06/2020, 11:33 AM

## 2020-02-06 NOTE — TOC Transition Note (Signed)
Transition of Care (TOC) - CM/SW Discharge Note Donn Pierini RN,BSN Transitions of Care Unit 4NP (non trauma) - RN Case Manager See Treatment Team for direct Phone #   Patient Details  Name: Ryan [name] MRN: [id number] Date of Birth: August 11, 1934  Transition of Care [facility]) CM/SW Contact:  Darrold Span, RN Phone Number: 02/06/2020, 12:47 PM   Clinical Narrative:    Pt stable for transition home, referral received for outpt PC needs and resources. Spoke with daughters at the bedside- open to outpt PC services- no preference and agreeable to [facility] referral. Discussed resources in the community- no list available to provide them.  Call made to Chales Abrahams with [facility] for outpt PC referral- they will f/u with family post discharge also requested for them to assist family with any resources they may know of for dementia/caregiver support.    Final next level of care: Home/Self Care Barriers to Discharge: No Barriers Identified   Patient Goals and CMS Choice Patient states their goals for this hospitalization and ongoing recovery are:: return home   Choice offered to / list presented to : NA  Discharge Placement                 Home      Discharge Plan and Services   Discharge Planning Services: CM Consult            DME Arranged: N/A DME Agency: NA       HH Arranged: NA HH Agency: NA        Social Determinants of Health (SDOH) Interventions     Readmission Risk Interventions Readmission Risk Prevention Plan 02/06/2020  Post Dischage Appt Complete  Medication Screening Complete  Transportation Screening Complete

## 2020-02-06 NOTE — Telephone Encounter (Signed)
This was taken care of on this inpatient last week. GM

## 2020-02-06 NOTE — Anesthesia Postprocedure Evaluation (Signed)
Anesthesia Post Note  Patient: Ryan [name]  Procedure(s) Performed: ESOPHAGOGASTRODUODENOSCOPY (EGD) WITH PROPOFOL (N/A ) FLEXIBLE SIGMOIDOSCOPY (N/A ) BIOPSY SAVORY DILATION (N/A )     Patient location during evaluation: PACU Anesthesia Type: MAC Level of consciousness: awake and alert Pain management: pain level controlled Vital Signs Assessment: post-procedure vital signs reviewed and stable Respiratory status: spontaneous breathing, nonlabored ventilation, respiratory function stable and patient connected to nasal cannula oxygen Cardiovascular status: stable and blood pressure returned to baseline Postop Assessment: no apparent nausea or vomiting Anesthetic complications: no   No complications documented.  Last Vitals:  Vitals:   02/06/20 0000 02/06/20 0400  BP:  (!) 113/57  Pulse:    Resp:    Temp: 36.9 C 36.7 C  SpO2:      Last Pain:  Vitals:   02/06/20 0400  TempSrc: Oral  PainSc:                  Lapel S

## 2020-02-06 NOTE — Progress Notes (Signed)
[facility] ([facility])  Hospital Liaison: RN note         Notified by TOC manager of patient/family request for [facility] Palliative services at home after discharge.                  [facility] Palliative team will follow up with patient after discharge.         Please call with any hospice or palliative related questions.         Thank you for this referral.         Mary Anne Robertson, RN, CCM  [facility] Hospital Liaison (listed on AMION under Hospice/[facility])    [phone]   

## 2020-02-07 ENCOUNTER — Encounter: Payer: Self-pay | Admitting: Gastroenterology

## 2020-02-07 ENCOUNTER — Other Ambulatory Visit: Payer: Self-pay | Admitting: Physician Assistant

## 2020-02-07 LAB — SURGICAL PATHOLOGY

## 2020-02-08 ENCOUNTER — Telehealth: Payer: Self-pay

## 2020-02-08 NOTE — Telephone Encounter (Signed)
Spoke with patient's daughter Ralph Leyden and scheduled an in-person Palliative Consult for 03/06/20 @ 12PM   COVID screening was negative. One dog in the home. Daughter will put it away before NP arrives. Patient is staying with daughter at this time.    Consent obtained; updated Outlook/Netsmart/Team List and Epic.

## 2020-02-15 ENCOUNTER — Other Ambulatory Visit: Payer: Self-pay

## 2020-02-15 ENCOUNTER — Ambulatory Visit (INDEPENDENT_AMBULATORY_CARE_PROVIDER_SITE_OTHER): Payer: Medicare Other | Admitting: Internal Medicine

## 2020-02-15 ENCOUNTER — Encounter: Payer: Self-pay | Admitting: Internal Medicine

## 2020-02-15 VITALS — BP 138/70 | HR 86 | Ht 70.0 in | Wt 176.0 lb

## 2020-02-15 DIAGNOSIS — G309 Alzheimer's disease, unspecified: Secondary | ICD-10-CM | POA: Diagnosis not present

## 2020-02-15 DIAGNOSIS — I480 Paroxysmal atrial fibrillation: Secondary | ICD-10-CM

## 2020-02-15 DIAGNOSIS — F0280 Dementia in other diseases classified elsewhere without behavioral disturbance: Secondary | ICD-10-CM | POA: Diagnosis not present

## 2020-02-15 DIAGNOSIS — K2971 Gastritis, unspecified, with bleeding: Secondary | ICD-10-CM | POA: Diagnosis not present

## 2020-02-15 MED ORDER — METOPROLOL TARTRATE 25 MG PO TABS: 12.5000 mg | ORAL_TABLET | Freq: Two times a day (BID) | ORAL | 3 refills | Status: DC

## 2020-02-15 NOTE — Progress Notes (Signed)
Cardiology Office Note:    Date:  02/15/2020   ID:  [name], DOB 12-22-1934, MRN [id number]  PCP:  [name], MD  [facility] Cardiologist:  No primary care provider on file.  [facility] Electrophysiologist:  None   CC: Follow up GI bleed  History of Present Illness:    Ryan [name] is a 85 y.o. male with a hx of Advanced Alzheimer's Disease, HTN, HLD, PAF; PE s/p IVC Filter on Eliquis and Prior GI bleeds who presents for evaluation.  Patient notes that he is feeling .  Has had no chest pain, chest pressure, chest tightness, chest stinging .  Notes no palpitations or funny heart beats.     Family notes that since his discharge for GI bleed in the setting of diverticulitis, his hemoglobin has been stable (confirmed by Dr. Pete Glatter).  Notes that he has an episode 02/14/20 where he had CP:  Was in AF (reviewed Tele strip from EMS), and rates in the 80s, and had BNP 134/80.  Resolved without intervention.  Family is unclear if patient will be returning to [place] (his home) or staying in [place].  Past Medical History:  Diagnosis Date  . Alzheimer disease (HCC)   . Benign prostatic hyperplasia 01/30/2020  . Chronic anticoagulation    On eliquis  . Gait disorder   . Hypercholesterolemia   . Hypertension   . Mixed hyperlipidemia 01/30/2020  . Paroxysmal atrial fibrillation [facility])     Past Surgical History:  Procedure Laterality Date  . BIOPSY  02/05/2020   Procedure: BIOPSY;  Surgeon: Meridee Score Netty Starring., MD;  Location: [facility] ENDOSCOPY;  Service: Gastroenterology;;  . CHOLECYSTECTOMY    . ESOPHAGOGASTRODUODENOSCOPY (EGD) WITH PROPOFOL N/A 02/05/2020   Procedure: ESOPHAGOGASTRODUODENOSCOPY (EGD) WITH PROPOFOL;  Surgeon: Meridee Score Netty Starring., MD;  Location: [facility] ENDOSCOPY;  Service: Gastroenterology;  Laterality: N/A;  . FLEXIBLE SIGMOIDOSCOPY N/A 02/05/2020   Procedure: FLEXIBLE SIGMOIDOSCOPY;  Surgeon: Lemar Lofty., MD;  Location[facility] END[facility]COPY;  Service:  Gastroenterology;  Laterality: N/A;  . SAVORY DILATION N/A 02/05/2020   Procedure: SAVORY DILATION;  Surgeon: Lemar Lofty., MD;  Location: [facility] ENDOSCOPY;  Service: Gastroenterology;  Laterality: N/A;  . TOTAL HIP ARTHROPLASTY      Current Medications: Current Meds  Medication Sig  . atorvastatin (LIPITOR) 40 MG tablet Take 40 mg by mouth daily.  . ferrous sulfate 325 (65 FE) MG EC tablet Take 1 tablet (325 mg total) by mouth 2 (two) times daily.  . melatonin 3 MG TABS tablet Take 2 tablets (6 mg total) by mouth at bedtime.  . Multiple Vitamin (MULTIVITAMIN) tablet Take 1 tablet by mouth daily.  . pantoprazole (PROTONIX) 40 MG tablet Take 1 tablet (40 mg total) by mouth 2 (two) times daily.  . polyethylene glycol (MIRALAX / GLYCOLAX) 17 g packet in the morning and at bedtime.  . Probiotic Product (PROBIOTIC ADVANCED PO) Take 500 mg by mouth daily in the afternoon.  . tamsulosin (FLOMAX) 0.4 MG CAPS capsule Take 0.4 mg by mouth daily after supper.   . [DISCONTINUED] Saccharomyces boulardii (PROBIOTIC) 250 MG CAPS Take by mouth daily in the afternoon.     Allergies:   Patient has no known allergies.   Social History   Socioeconomic History  . Marital status: Married    Spouse name: Not on file  . Number of children: 3  . Years of education: Not on file  . Highest education level: High school graduate  Occupational History    Comment: retired  Tobacco Use  . Smoking status: Never Smoker  . Smokeless tobacco: Never Used  . Tobacco comment: maybe in 20's  Substance and Sexual Activity  . Alcohol use: Not Currently  . Drug use: Never  . Sexual activity: Not on file  Other Topics Concern  . Not on file  Social History Narrative   01/17/20 lives with wife in home   Social Determinants of Health   Financial Resource Strain: Not on file  Food Insecurity: Not on file  Transportation Needs: Not on file  Physical Activity: Not on file  Stress: Not on file  Social  Connections: Not on file     Family History: The patient's family history is notable for daughter with SVT  ROS:   Please see the history of present illness.     All other systems reviewed and are negative.  EKGs/Labs/Other Studies Reviewed:    The following studies were reviewed today:  EKG:   01/30/20 SR 1st HB rate 75 QTc 489, borderline inferior q wave  Recent Labs: 02/02/2020: Magnesium 1.9 02/05/2020: ALT 21 02/06/2020: BUN 9; Creatinine, Ser 1.14; Hemoglobin 8.9; Platelets 161; Potassium 3.2; Sodium 138  Recent Lipid Panel    Component Value Date/Time   CHOL 94 02/01/2020 0401   TRIG 40 02/01/2020 0401   HDL 46 02/01/2020 0401   CHOLHDL 2.0 02/01/2020 0401   VLDL 8 02/01/2020 0401   LDLCALC 40 02/01/2020 0401   Risk Assessment/Calculations:     N/A  Physical Exam:    VS:  BP 138/70   Pulse 86   Ht 5\' 10"  (1.778 m)   Wt 176 lb (79.8 kg)   SpO2 100%   BMI 25.25 kg/m     Wt Readings from Last 3 Encounters:  02/15/20 176 lb (79.8 kg)  01/30/20 175 lb 0.7 oz (79.4 kg)  01/17/20 175 lb (79.4 kg)    GEN:  Well nourished, elderly male in no acute distress HEENT: Normal NECK: No JVD; No carotid bruits LYMPHATICS: No lymphadenopathy CARDIAC: RRR, no murmurs, rubs, gallops RESPIRATORY:  Clear to auscultation without rales, wheezing or rhonchi  ABDOMEN: Soft, non-tender, non-distended MUSCULOSKELETAL:  No edema; No deformity  SKIN: Warm and dry NEUROLOGIC:  Alert and oriented x 3 PSYCHIATRIC:  Normal affect   ASSESSMENT:    1. PAF (paroxysmal atrial fibrillation) (HCC)   2. Gastrointestinal hemorrhage associated with gastritis, unspecified gastritis type   3. Alzheimer disease (HCC)    PLAN:    In order of problems listed above:  Paroxysmal Atrial Fibrillation  PE GI Bleed Alzheimers Disease- advanced stage - CHADSVASC=3. HASBLED 3 - discussed at length with daughters (Daughter 01/19/20 done virtually) about AF, risk of stroke, risk of bleeding,  demand ischemia, SVT, and goals of care.  Discussed metoprolol tartrate 12.5 mg PO BID and BP monitoring.  Given his significant Alzhemiers, would recommend conservative therapy.  After Shared Decision Making, offering 12.5 mg PO BID for AF.  4-5 follow up unless new symptoms or abnormal test results warranting change in plan  Would be reasonable for  Virtual Follow up  Would be reasonable for  APP Follow up  Medication Adjustments/Labs and Tests Ordered: Current medicines are reviewed at length with the patient today.  Concerns regarding medicines are outlined above.  No orders of the defined types were placed in this encounter.  Meds ordered this encounter  Medications  . metoprolol tartrate (LOPRESSOR) 25 MG tablet    Sig: Take 0.5 tablets (12.5 mg total) by mouth 2 (  two) times daily.    Dispense:  90 tablet    Refill:  3    Patient Instructions  Medication Instructions:  Your physician has recommended you make the following change in your medication:   Begin metoprolol tartrate, 12.5 mg, half tablet by mouth, twice daily. If blood pressure is less than 100/60 or you feel dizzy, discontinue medication  Labwork: None ordered.  Testing/Procedures: None ordered.  Follow-Up: Your physician recommends that you schedule a follow-up appointment in:   Dr. Izora Ribas in 4-5 months  Any Other Special Instructions Will Be Listed Below (If Applicable).     If you need a refill on your cardiac medications before your next appointment, please call your pharmacy.      Signed, Christell Constant, MD  02/15/2020 5:23 PM    [facility]

## 2020-02-15 NOTE — Patient Instructions (Addendum)
Medication Instructions:  Your physician has recommended you make the following change in your medication:   Begin metoprolol tartrate, 12.5 mg, half tablet by mouth, twice daily. If blood pressure is less than 100/60 or you feel dizzy, discontinue medication  Labwork: None ordered.  Testing/Procedures: None ordered.  Follow-Up: Your physician recommends that you schedule a follow-up appointment in:   Dr. Izora Ribas in 4-5 months  Any Other Special Instructions Will Be Listed Below (If Applicable).     If you need a refill on your cardiac medications before your next appointment, please call your pharmacy.

## 2020-03-06 ENCOUNTER — Other Ambulatory Visit: Payer: Self-pay

## 2020-03-06 ENCOUNTER — Other Ambulatory Visit: Payer: Medicare Other | Admitting: Internal Medicine

## 2020-03-07 ENCOUNTER — Other Ambulatory Visit: Payer: Self-pay | Admitting: Gastroenterology

## 2020-03-08 ENCOUNTER — Other Ambulatory Visit: Payer: Self-pay | Admitting: Gastroenterology

## 2020-03-22 ENCOUNTER — Ambulatory Visit: Payer: Medicare Other | Admitting: Neurology

## 2020-04-28 ENCOUNTER — Emergency Department (HOSPITAL_COMMUNITY)
Admission: EM | Admit: 2020-04-28 | Discharge: 2020-04-29 | Disposition: A | Discharge status: Home / Self Care | Payer: Medicare Other | Attending: Emergency Medicine | Admitting: Emergency Medicine

## 2020-04-28 ENCOUNTER — Encounter (HOSPITAL_COMMUNITY): Payer: Self-pay | Admitting: Emergency Medicine

## 2020-04-28 ENCOUNTER — Other Ambulatory Visit: Payer: Self-pay

## 2020-04-28 DIAGNOSIS — I480 Paroxysmal atrial fibrillation: Secondary | ICD-10-CM | POA: Insufficient documentation

## 2020-04-28 DIAGNOSIS — G309 Alzheimer's disease, unspecified: Secondary | ICD-10-CM | POA: Diagnosis not present

## 2020-04-28 DIAGNOSIS — Z79899 Other long term (current) drug therapy: Secondary | ICD-10-CM | POA: Diagnosis not present

## 2020-04-28 DIAGNOSIS — S7002XA Contusion of left hip, initial encounter: Secondary | ICD-10-CM | POA: Diagnosis not present

## 2020-04-28 DIAGNOSIS — I10 Essential (primary) hypertension: Secondary | ICD-10-CM | POA: Insufficient documentation

## 2020-04-28 DIAGNOSIS — Z96649 Presence of unspecified artificial hip joint: Secondary | ICD-10-CM | POA: Diagnosis not present

## 2020-04-28 DIAGNOSIS — S79912A Unspecified injury of left hip, initial encounter: Secondary | ICD-10-CM | POA: Diagnosis present

## 2020-04-28 DIAGNOSIS — Z7901 Long term (current) use of anticoagulants: Secondary | ICD-10-CM | POA: Insufficient documentation

## 2020-04-28 DIAGNOSIS — W19XXXA Unspecified fall, initial encounter: Secondary | ICD-10-CM | POA: Insufficient documentation

## 2020-04-28 DIAGNOSIS — Y9301 Activity, walking, marching and hiking: Secondary | ICD-10-CM | POA: Diagnosis not present

## 2020-04-28 DIAGNOSIS — M79642 Pain in left hand: Secondary | ICD-10-CM | POA: Insufficient documentation

## 2020-04-28 DIAGNOSIS — N179 Acute kidney failure, unspecified: Secondary | ICD-10-CM | POA: Insufficient documentation

## 2020-04-28 NOTE — ED Triage Notes (Addendum)
Pt's daughter said he was walking outside and fell on his left hpi and left wrist. No obvious deformity but pt is saying it is sore and swelling. Pt was walking. Pt did not hit head or LOC, Not on a blood thinner

## 2020-04-29 ENCOUNTER — Emergency Department (HOSPITAL_COMMUNITY): Payer: Medicare Other

## 2020-04-29 DIAGNOSIS — S7002XA Contusion of left hip, initial encounter: Secondary | ICD-10-CM | POA: Diagnosis not present

## 2020-04-29 LAB — CBC WITH DIFFERENTIAL/PLATELET
Abs Immature Granulocytes: 0.05 10*3/uL (ref 0.00–0.07)
Basophils Absolute: 0 10*3/uL (ref 0.0–0.1)
Basophils Relative: 0 %
Eosinophils Absolute: 0.1 10*3/uL (ref 0.0–0.5)
Eosinophils Relative: 1 %
HCT: 43.6 % (ref 39.0–52.0)
Hemoglobin: 13.7 g/dL (ref 13.0–17.0)
Immature Granulocytes: 1 %
Lymphocytes Relative: 14 %
Lymphs Abs: 1.5 10*3/uL (ref 0.7–4.0)
MCH: 26.3 pg (ref 26.0–34.0)
MCHC: 31.4 g/dL (ref 30.0–36.0)
MCV: 83.7 fL (ref 80.0–100.0)
Monocytes Absolute: 1 10*3/uL (ref 0.1–1.0)
Monocytes Relative: 9 %
Neutro Abs: 8.2 10*3/uL — ABNORMAL HIGH (ref 1.7–7.7)
Neutrophils Relative %: 75 %
Platelets: 175 10*3/uL (ref 150–400)
RBC: 5.21 MIL/uL (ref 4.22–5.81)
RDW: 19.7 % — ABNORMAL HIGH (ref 11.5–15.5)
WBC: 10.8 10*3/uL — ABNORMAL HIGH (ref 4.0–10.5)
nRBC: 0 % (ref 0.0–0.2)

## 2020-04-29 LAB — BASIC METABOLIC PANEL
Anion gap: 9 (ref 5–15)
BUN: 39 mg/dL — ABNORMAL HIGH (ref 8–23)
CO2: 25 mmol/L (ref 22–32)
Calcium: 9.1 mg/dL (ref 8.9–10.3)
Chloride: 104 mmol/L (ref 98–111)
Creatinine, Ser: 2 mg/dL — ABNORMAL HIGH (ref 0.61–1.24)
GFR, Estimated: 33 mL/min — ABNORMAL LOW (ref >60)
Glucose, Bld: 111 mg/dL — ABNORMAL HIGH (ref 70–99)
Potassium: 4.3 mmol/L (ref 3.5–5.1)
Sodium: 138 mmol/L (ref 135–145)

## 2020-04-29 IMAGING — DX DG HAND COMPLETE 3+V*L*
3 series · 3 of 3 positions shown · non-contrast
Comparison: None

CLINICAL DATA: Fall, left hand pain

EXAM:
LEFT HAND - COMPLETE 3+ VIEW

[hand pa]
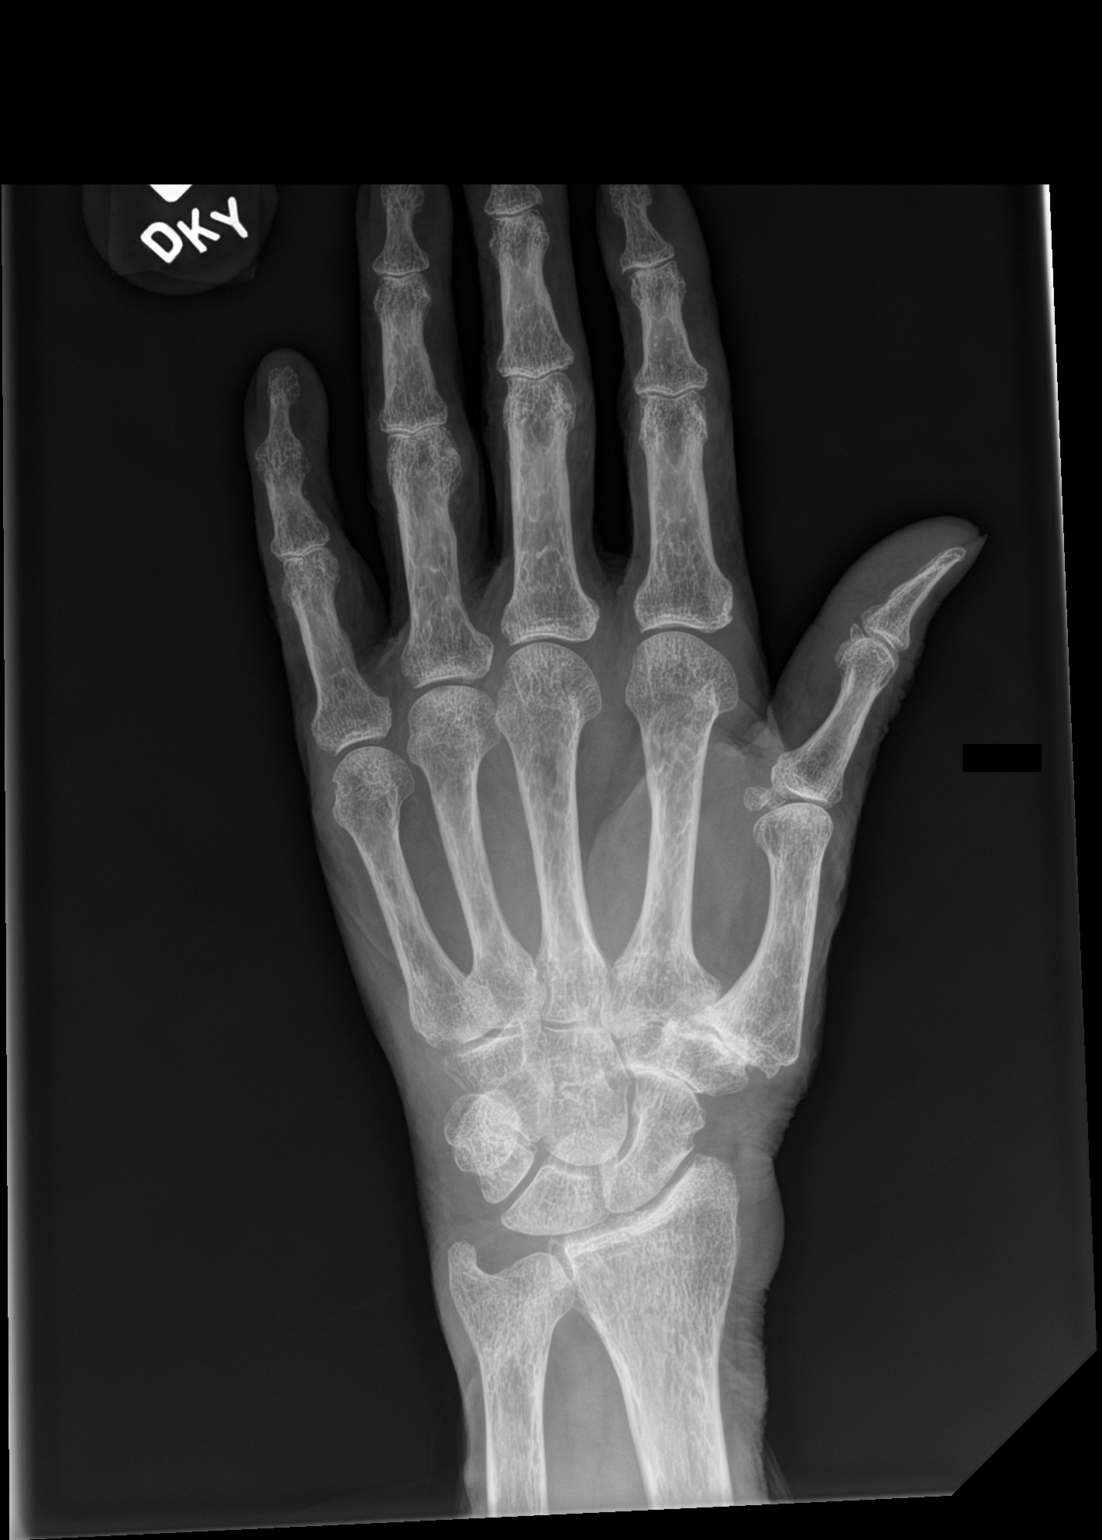

[hand obl]
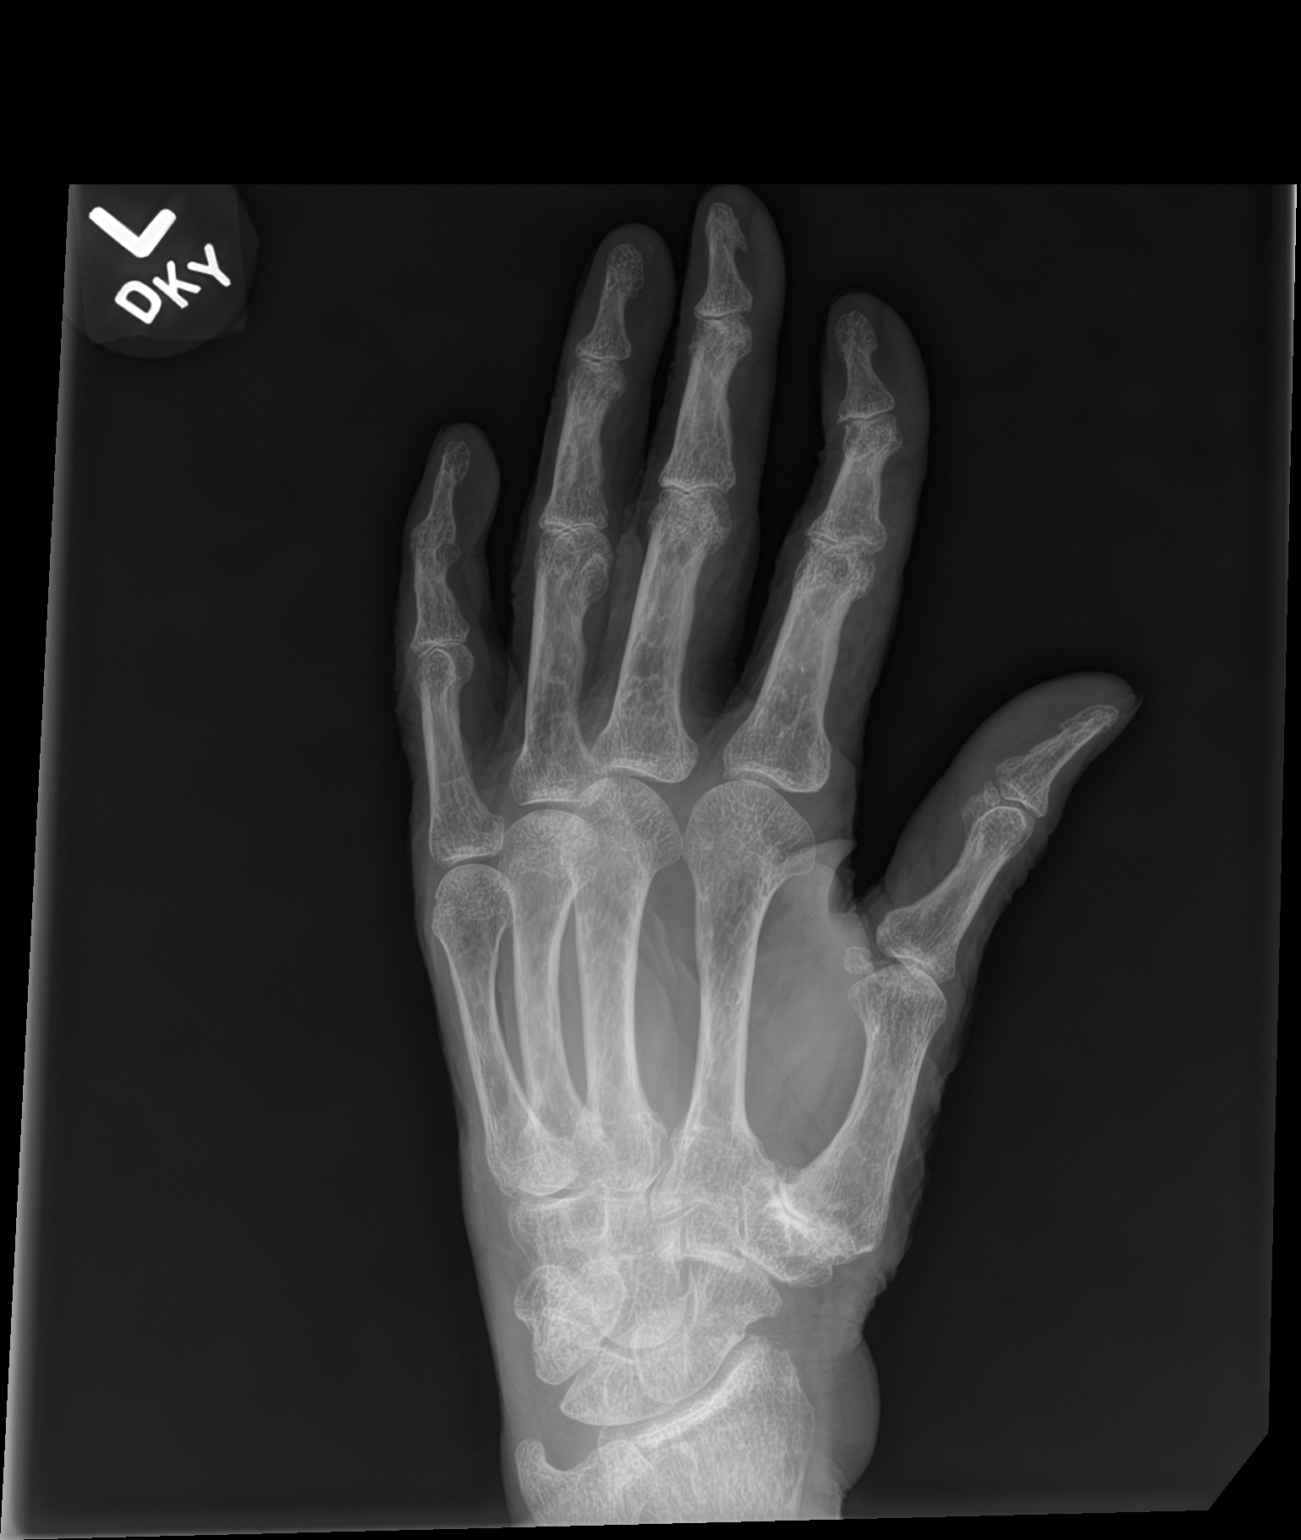

[hand lat]
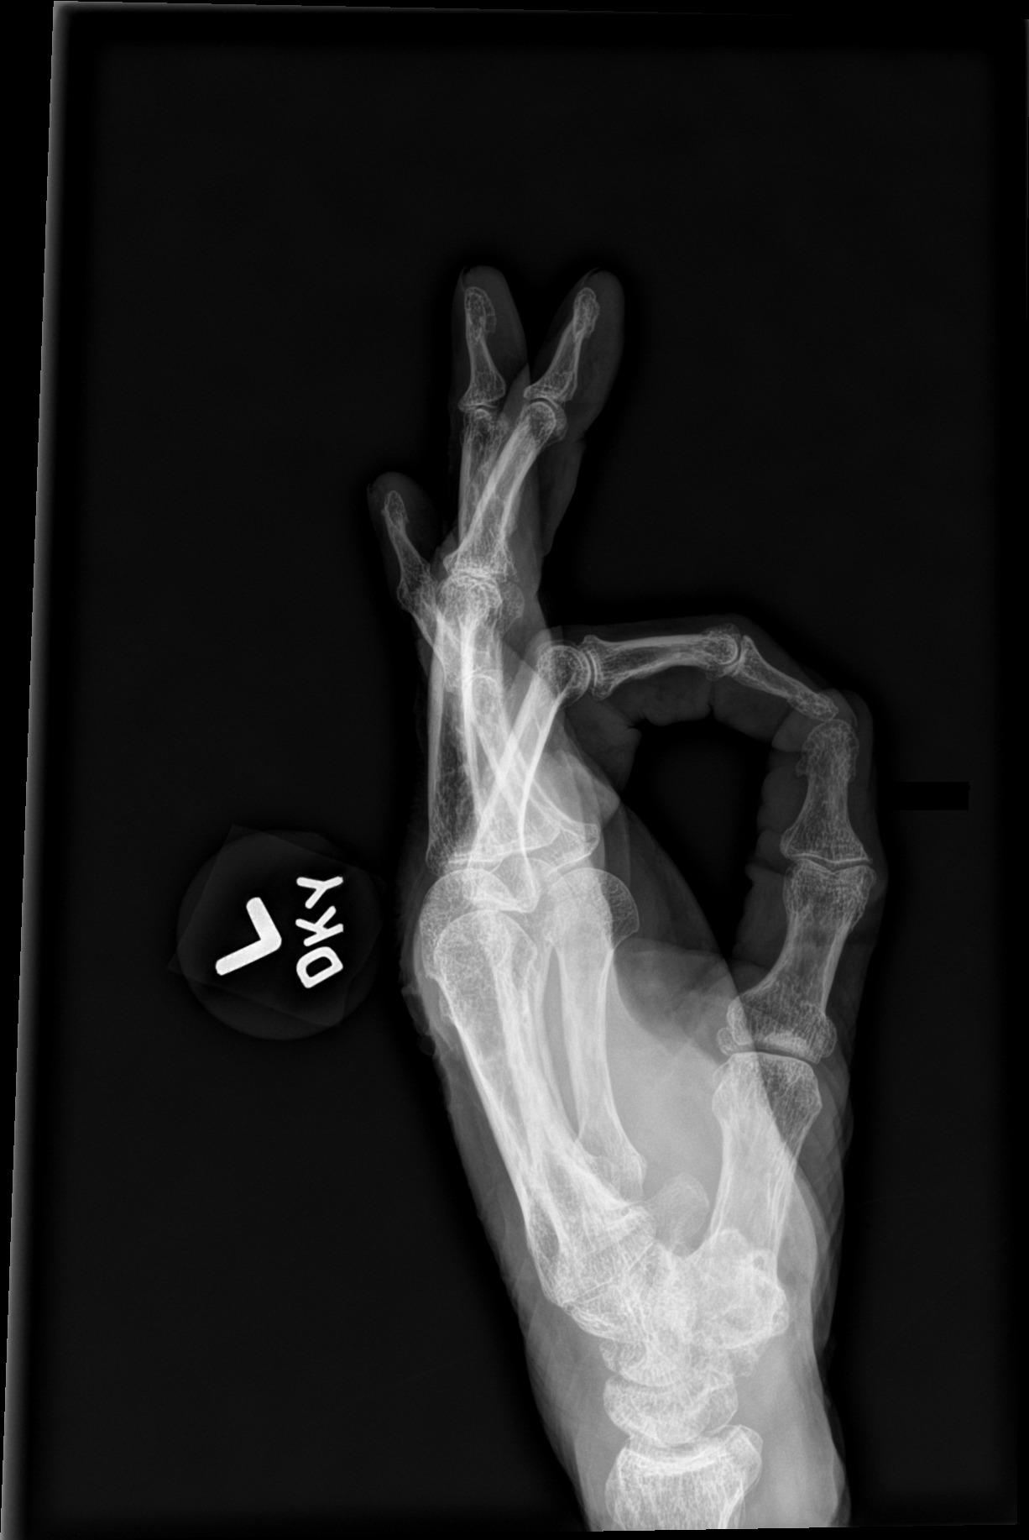

[3 of 3 positions shown; findings below may reference images not displayed]

FINDINGS: The osseous structures are diffusely osteopenic. Normal alignment.
No fracture or dislocation. Moderate degenerative arthritis at the
base of the thumb at the first CMC joint. Mild degenerative
arthritis involving the triscaphe joint. Remaining joint spaces are
preserved. Soft tissues are unremarkable.
IMPRESSION: No acute fracture or dislocation.

## 2020-04-29 IMAGING — CR DG WRIST COMPLETE 3+V*L*
4 series · 4 of 4 positions shown · non-contrast
Comparison: None.

CLINICAL DATA: Fall, left wrist pain

EXAM:
LEFT WRIST - COMPLETE 3+ VIEW

[wrist pa]
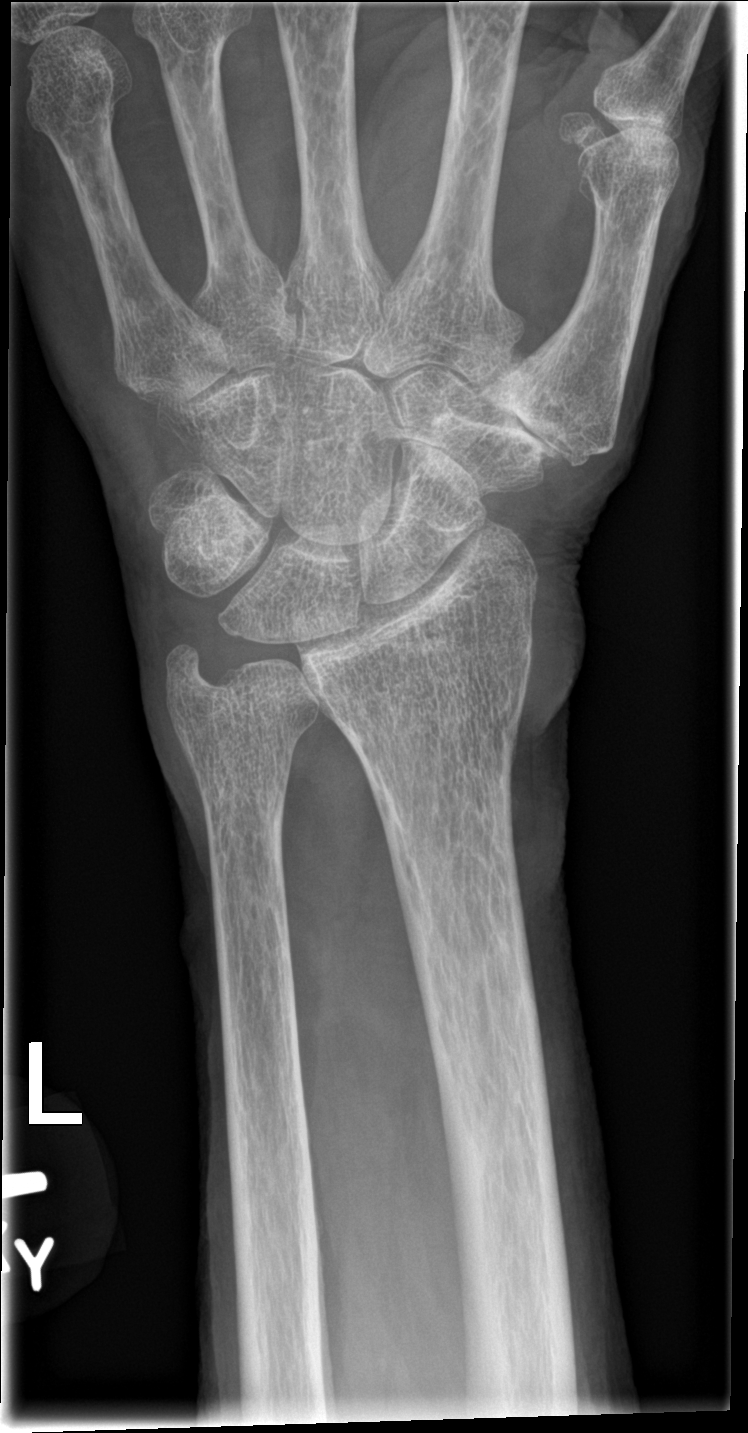

[wrist obl]
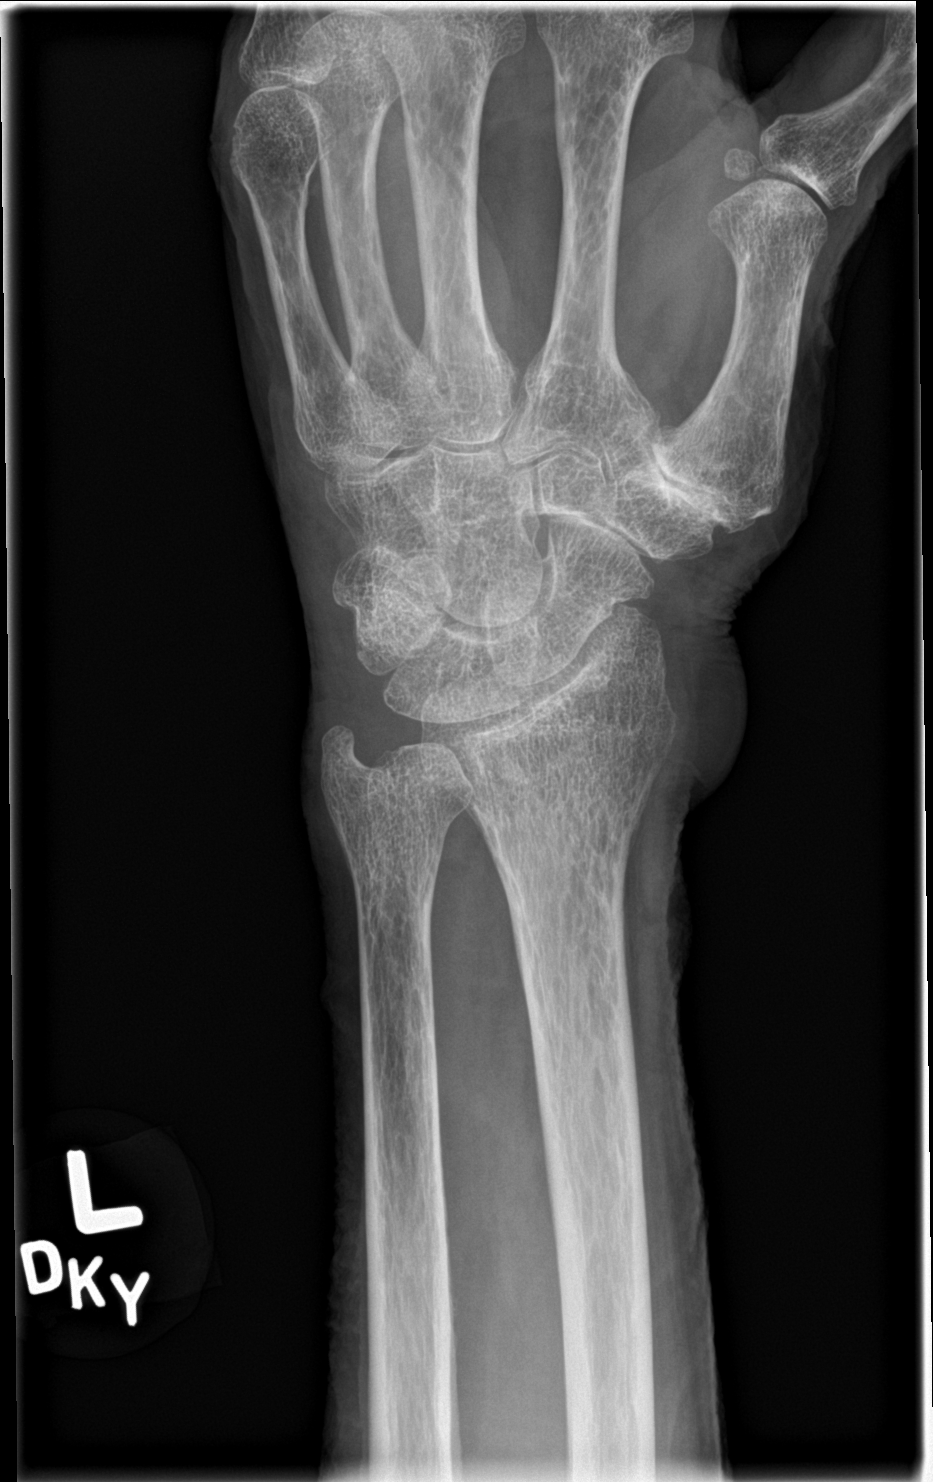

[wrist lat]
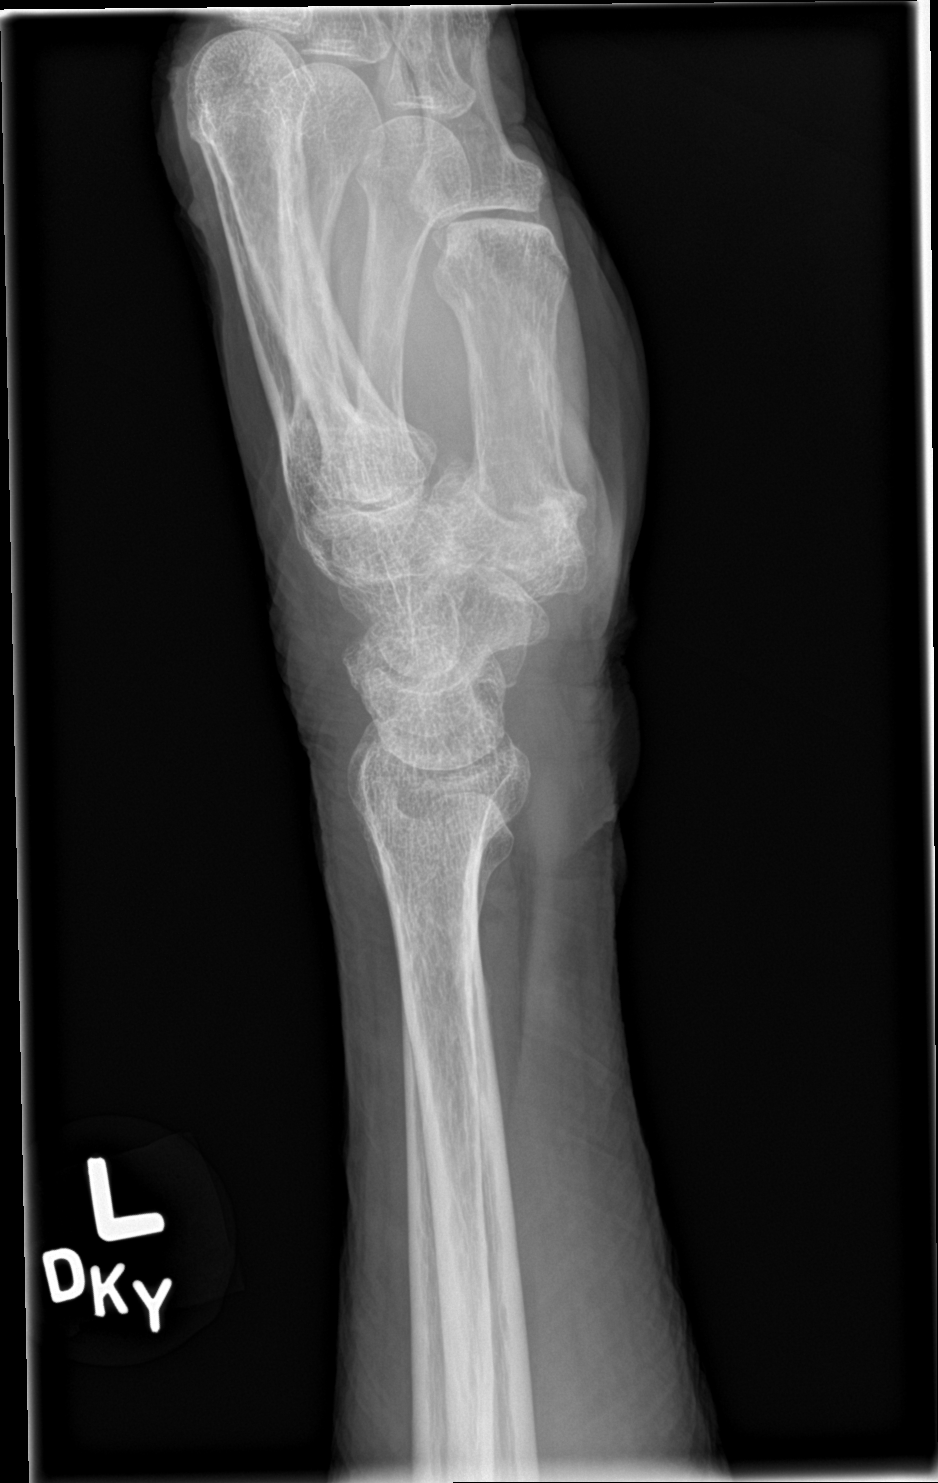

[wrist navicular]
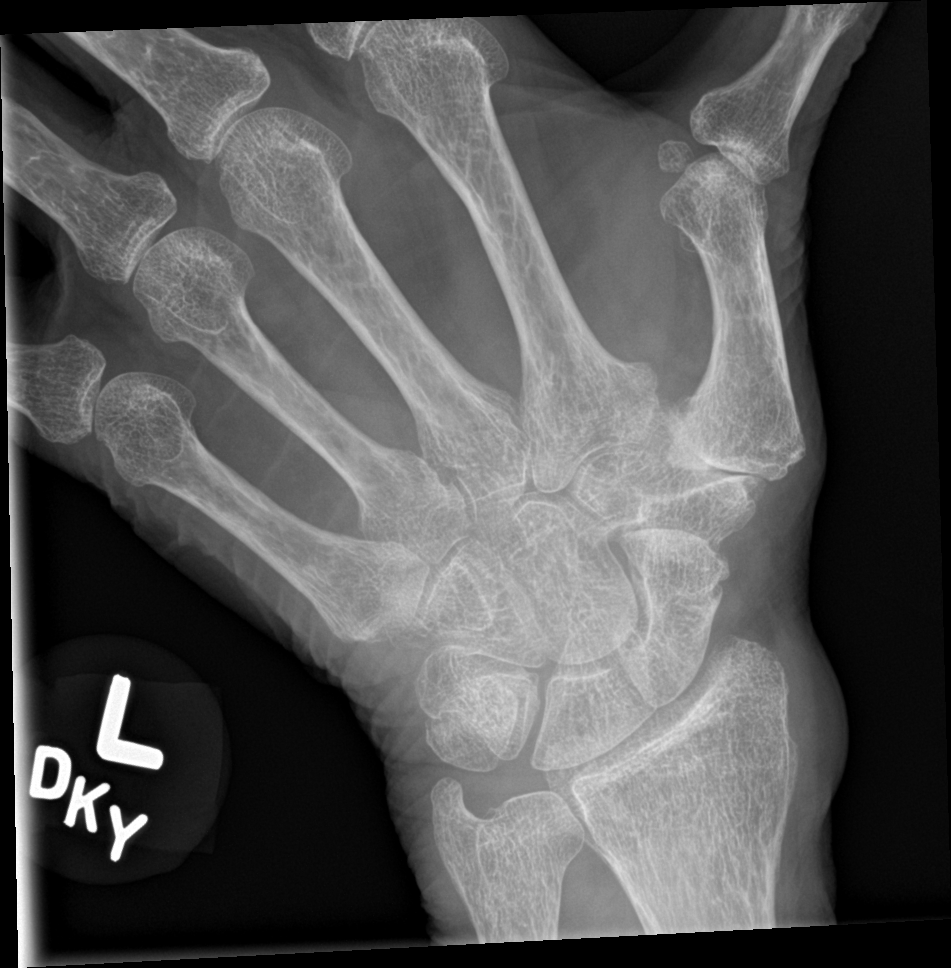

[4 of 4 positions shown; findings below may reference images not displayed]

FINDINGS: Four view radiograph left wrist demonstrates normal alignment.
Osseous structures are diffusely mildly osteopenic. No acute
fracture or dislocation. There is moderate degenerative arthritis at
the base of the thumb at the first CMC joint. Mild soft tissue
swelling along the dorsum of the carpus and along the volar aspect
of the wrist subjacent to the distal radius.
IMPRESSION: No acute fracture or dislocation.  Soft tissue swelling.

## 2020-04-29 IMAGING — CR DG HIP (WITH OR WITHOUT PELVIS) 2-3V*L*
3 series · 3 of 3 positions shown · non-contrast
Comparison: None.

CLINICAL DATA: Fall, left hip pain

EXAM:
DG HIP (WITH OR WITHOUT PELVIS) 2-3V LEFT

[hip ap]
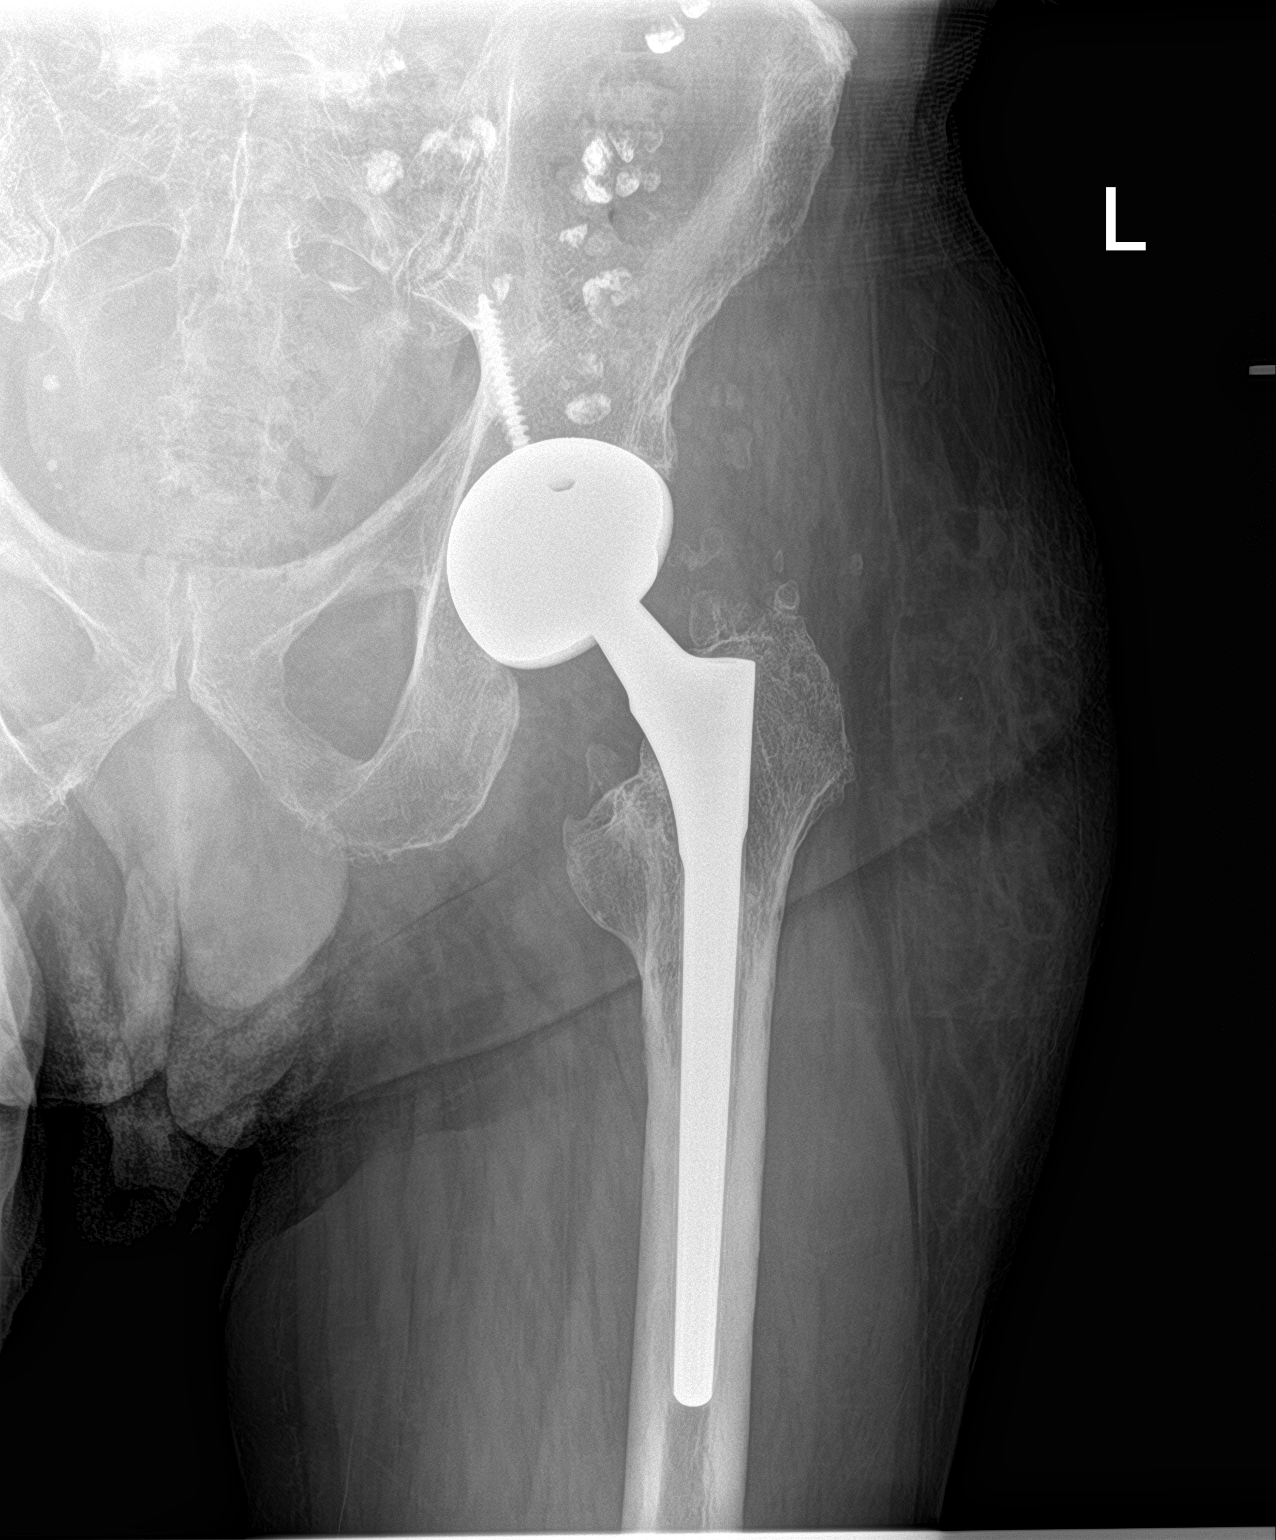

[hip lat]
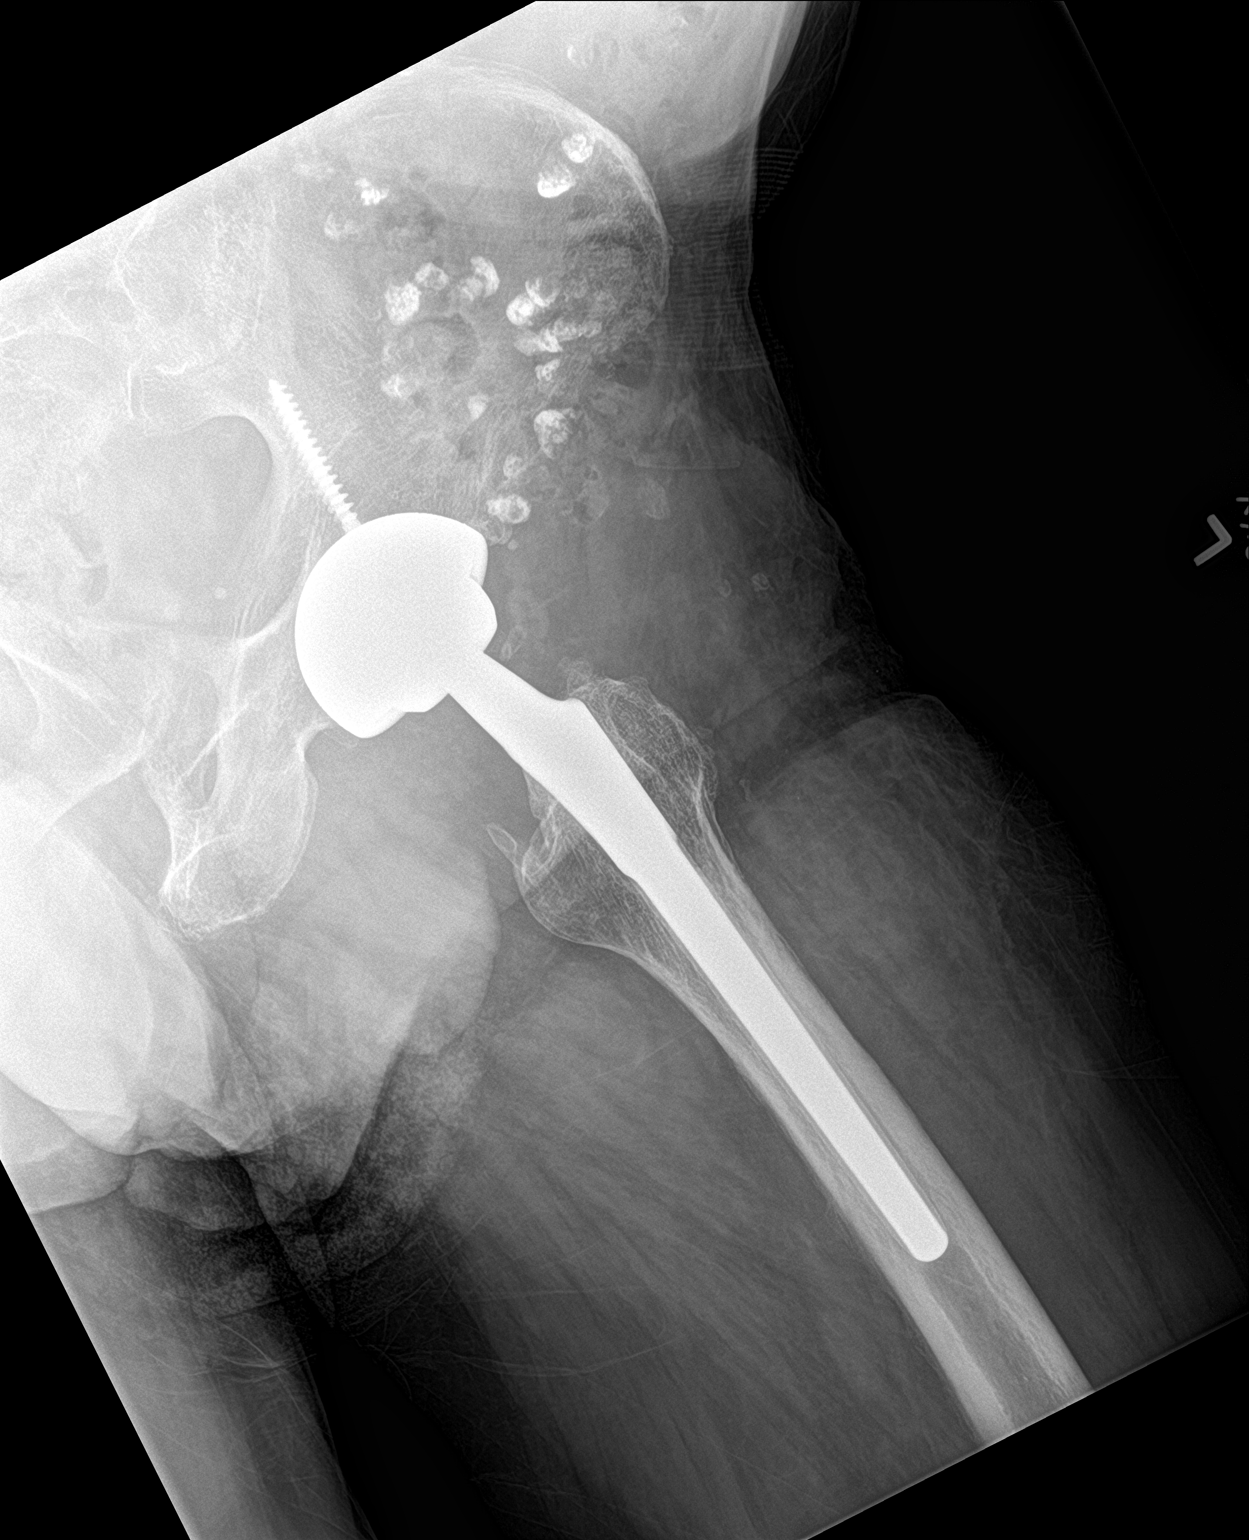

[pelvis ap]
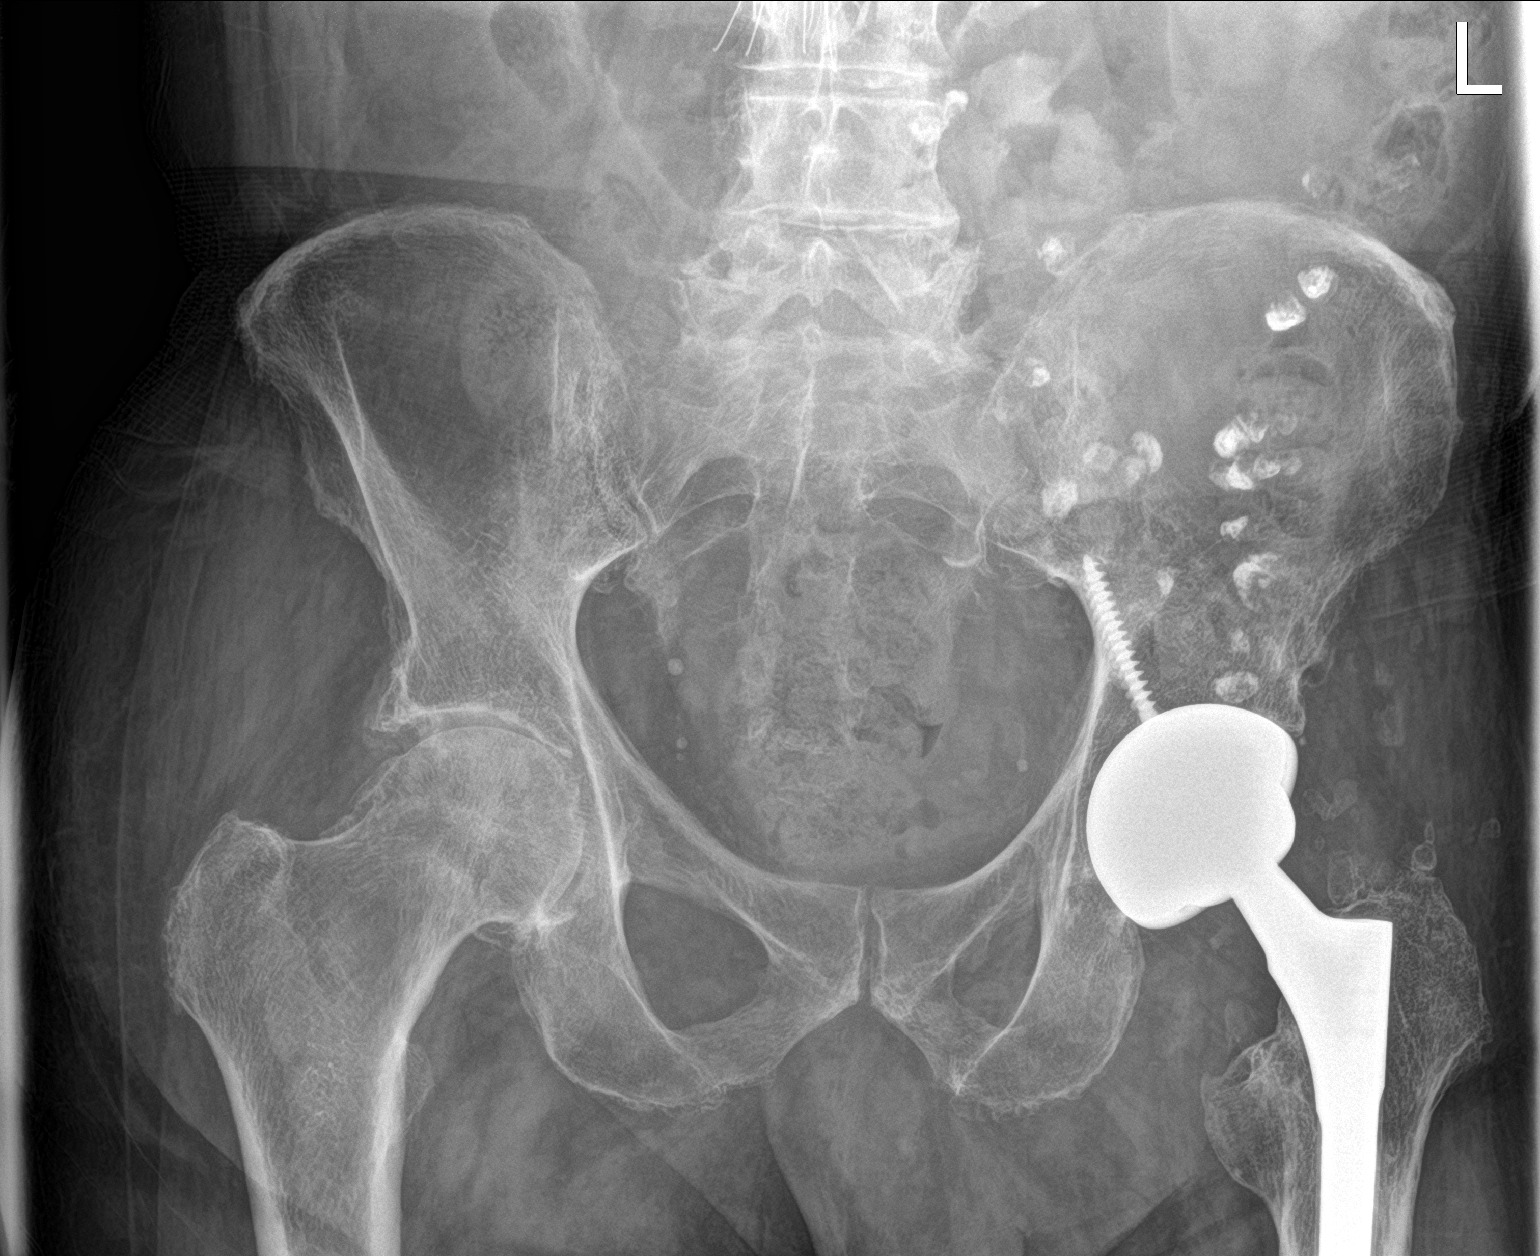

[3 of 3 positions shown; findings below may reference images not displayed]

FINDINGS: Single view radiograph of the pelvis and two view radiograph of the
left hip demonstrates surgical changes of left total hip
arthroplasty. Arthroplasty components are in expected alignment. No
acute fracture or dislocation. Retained contrast is seen within
multiple diverticula within the pelvis. Moderate subcutaneous edema
is seen within the soft tissues lateral to the left hip.
IMPRESSION: Soft tissue swelling.  No acute fracture or dislocation.

## 2020-04-29 NOTE — ED Provider Notes (Signed)
[facility] Provider Note   CSN: [id number] Arrival date & time: 04/28/20  2336     History Chief Complaint  Patient presents with  . Fall    Ryan MeyerDavid [name] is a 86 y.o. male.  HPI     This 86 year old male with a history of Alzheimer's [name], Ryan [name], Ryan [name] presents following a fall.  Daughter reports that she witnessed him fall around 5 PM yesterday.  He lost his balance and was not walking with his walker.  He fell onto his left side.  He did not hit his head or lose consciousness per the daughter.  She states that he has been ambulatory since.  However, when she went to change him tonight she noted swelling of the left hip and tenderness to palpation.  He was also complaining of some left hand pain.  Patient currently denies any pain when I asked him.  Past Medical History:  Diagnosis Date  . Alzheimer [name]   . Benign prostatic hyperplasia 01/30/2020  . Chronic anticoagulation    On [name]  . Gait disorder   . Hypercholesterolemia   . Ryan   . Mixed hyperlipidemia 01/30/2020  . Ryan atrial fibrillation [name](HCC)     Patient Active Problem List   Diagnosis Date Noted  . PAF (Ryan atrial fibrillation) (HCC) 02/15/2020  . Alzheimer [name] 02/15/2020  . GI bleed 01/31/2020  . Lower GI bleed   . Primary Ryan   . Acute GI bleeding 01/30/2020  . Mixed hyperlipidemia 01/30/2020  . Benign prostatic hyperplasia 01/30/2020  . Anemia due to acute blood loss 01/30/2020  . AF (Ryan atrial fibrillation) (HCC) 01/30/2020    Past Surgical History:  Procedure Laterality Date  . BIOPSY  02/05/2020   Procedure: BIOPSY;  Surgeon: Meridee ScoreMansouraty, Netty StarringGabriel Jr., MD;  [facility];  Service: Gastroenterology;;  . CHOLECYSTECTOMY    . ESOPHAGOGASTRODUODENOSCOPY (EGD) WITH PROPOFOL N/A 02/05/2020   Procedure: ESOPHAGOGASTRODUODENOSCOPY (EGD) WITH  PROPOFOL;  Surgeon: Meridee ScoreMansouraty, Netty StarringGabriel Jr., MD;  [facility];  Service: Gastroenterology;  Laterality: N/A;  . FLEXIBLE SIGMOIDOSCOPY N/A 02/05/2020   Procedure: FLEXIBLE SIGMOIDOSCOPY;  Surgeon: Lemar LoftyMansouraty, Gabriel Jr., MD;  [facility];  Service: Gastroenterology;  Laterality: N/A;  . SAVORY DILATION N/A 02/05/2020   Procedure: SAVORY DILATION;  Surgeon: Lemar LoftyMansouraty, Gabriel Jr., MD;  [facility];  Service: Gastroenterology;  Laterality: N/A;  . TOTAL HIP ARTHROPLASTY         History reviewed. No pertinent family history.  Social History   Tobacco Use  . Smoking status: Never Smoker  . Smokeless tobacco: Never Used  . Tobacco comment: maybe in 20's  Substance Use Topics  . Alcohol use: Not Currently  . Drug use: Never    Home Medications Prior to Admission medications   Medication Sig Start Date End Date Taking? Authorizing Provider  atorvastatin (LIPITOR) 40 MG tablet Take 40 mg by mouth daily.    [provider]  ferrous sulfate 325 (65 FE) MG EC tablet Take 1 tablet (325 mg total) by mouth 2 (two) times daily. 02/06/20 02/05/21  Dorcas CarrowGhimire, Kuber, MD  melatonin 3 MG TABS tablet Take 2 tablets (6 mg total) by mouth at bedtime. 02/05/20   Swayze, Ava, DO  metoprolol tartrate (LOPRESSOR) 25 MG tablet Take 0.5 tablets (12.5 mg total) by mouth 2 (two) times daily. 02/15/20 05/15/20  Christell Constanthandrasekhar, Mahesh A, MD  Multiple Vitamin (MULTIVITAMIN) tablet Take 1 tablet by mouth daily.  [provider]  pantoprazole (PROTONIX) 40 MG tablet TAKE 1 TABLET(40 MG) BY MOUTH TWICE DAILY 03/08/20   Mansouraty, Netty Starring., MD  polyethylene glycol (MIRALAX / GLYCOLAX) 17 g packet in the morning and at bedtime. 02/14/20   [provider]  Probiotic Product (PROBIOTIC ADVANCED PO) Take 500 mg by mouth daily in the afternoon.    [provider]  tamsulosin (FLOMAX) 0.4 MG CAPS capsule Take 0.4 mg by mouth daily after supper.     [provider]    Allergies    Patient has no known allergies.  Review of Systems   Review of Systems  Constitutional: Negative for fever.  Respiratory: Negative for shortness of breath.   Cardiovascular: Negative for chest pain.  Gastrointestinal: Negative for abdominal pain, nausea and vomiting.  Musculoskeletal:       Left hip and hand pain  Skin: Positive for color change.  All other systems reviewed and are negative.   Physical Exam Updated Vital Signs BP (!) 154/96   Pulse 73   Temp (!) 97.5 F (36.4 C) (Oral)   Resp 13   SpO2 99%   Physical Exam Vitals and nursing note reviewed.  Constitutional:      Appearance: He is well-developed and well-nourished.     Comments: Elderly, no acute distress  HENT:     Head: Normocephalic and atraumatic.     Mouth/Throat:     Mouth: Mucous membranes are moist.  Eyes:     Pupils: Pupils are equal, round, and reactive to light.  Cardiovascular:     Rate and Rhythm: Normal rate and regular rhythm.     Heart sounds: Normal heart sounds. No murmur heard.   Pulmonary:     Effort: Pulmonary effort is normal. No respiratory distress.     Breath sounds: Normal breath sounds. No wheezing.  Abdominal:     General: Bowel sounds are normal.     Palpations: Abdomen is soft.     Tenderness: There is no abdominal tenderness. There is no rebound.  Musculoskeletal:        General: No edema. Normal range of motion.     Cervical back: Neck supple.     Comments: Swelling and ecchymosis noted to the left lateral hip with tenderness to palpation, normal range of motion, well-healed scar from prior hip replacement Slight swelling and discoloration of the dorsum of the left hand, normal range of motion, no tenderness to palpation, neurovascular intact distally  Lymphadenopathy:     Cervical: No cervical adenopathy.  Skin:    General: Skin is warm and dry.  Neurological:     Mental Status: He is alert.     Comments: Oriented only to self   Psychiatric:        Mood and Affect: Mood and affect normal.     ED Results / Procedures / Treatments   Labs (all labs ordered are listed, but only abnormal results are displayed) Labs Reviewed  CBC WITH DIFFERENTIAL/PLATELET - Abnormal; Notable for the following components:      Result Value   WBC 10.8 (*)    RDW 19.7 (*)    Neutro Abs 8.2 (*)    All other components within normal limits  BASIC METABOLIC PANEL - Abnormal; Notable for the following components:   Glucose, Bld 111 (*)    BUN 39 (*)    Creatinine, Ser 2.00 (*)    GFR, Estimated 33 (*)    All other components within normal limits  URINALYSIS, ROUTINE W REFLEX MICROSCOPIC    EKG None  Radiology DG Wrist Complete Left  Result Date: 04/29/2020 CLINICAL DATA:  Fall, left wrist pain EXAM: LEFT WRIST - COMPLETE 3+ VIEW COMPARISON:  None. FINDINGS: Four view radiograph left wrist demonstrates normal alignment. Osseous structures are diffusely mildly osteopenic. No acute fracture or dislocation. There is moderate degenerative arthritis at the base of the thumb at the first [facility] joint. Mild soft tissue swelling along the dorsum of the carpus and along the volar aspect of the wrist subjacent to the distal radius. IMPRESSION: No acute fracture or dislocation.  Soft tissue swelling. Electronically Signed   By: Helyn Numbers MD   On: 04/29/2020 00:19   DG Hand Complete Left  Result Date: 04/29/2020 CLINICAL DATA:  Fall, left hand pain EXAM: LEFT HAND - COMPLETE 3+ VIEW COMPARISON:  None FINDINGS: The osseous structures are diffusely osteopenic. Normal alignment. No fracture or dislocation. Moderate degenerative arthritis at the base of the thumb at the first [facility] joint. Mild degenerative arthritis involving the triscaphe joint. Remaining joint spaces are preserved. Soft tissues are unremarkable. IMPRESSION: No acute fracture or dislocation. Electronically Signed   By: Helyn Numbers MD   On: 04/29/2020 02:14   DG Hip Unilat With  Pelvis 2-3 Views Left  Result Date: 04/29/2020 CLINICAL DATA:  Fall, left hip pain EXAM: DG HIP (WITH OR WITHOUT PELVIS) 2-3V LEFT COMPARISON:  None. FINDINGS: Single view radiograph of the pelvis and two view radiograph of the left hip demonstrates surgical changes of left total hip arthroplasty. Arthroplasty components are in expected alignment. No acute fracture or dislocation. Retained contrast is seen within multiple diverticula within the pelvis. Moderate subcutaneous edema is seen within the soft tissues lateral to the left hip. IMPRESSION: Soft tissue swelling.  No acute fracture or dislocation. Electronically Signed   By: Helyn Numbers MD   On: 04/29/2020 00:17    Procedures Procedures   Medications Ordered in ED Medications - No data to display  ED Course  I have reviewed the triage vital signs and the nursing notes.  Pertinent labs & imaging results that were available during my care of the patient were reviewed by me and considered in my medical decision making (see chart for details).    MDM Rules/Calculators/A&P                          Patient presents following a fall.  He is on [name].  His fall was witnessed by the daughter [name] states that he did not hit his head or lose consciousness.  He has been at his baseline mental status.  He has had progressive left hip pain and left hand pain.  He is overall nontoxic-appearing and vital signs are reassuring.  He has a significant hematoma to the left hip and tenderness palpation of the hand.  X-rays obtained and independently reviewed by myself.  No fractures noted lab work was sent from triage.  This was reviewed.  Incidentally he is noted to have a creatinine of 2.0.  His baseline is closer to 1.  Daughter denies any recent medication changes or decreased p.o. intake or dehydration.  Initially I ordered a urinalysis.  However, patient was unable to provide sample and daughter would like to just follow-up with PCP.  Feel this is  reasonable as this was not his primary complaint.  Encouraged aggressive hydration and avoidance of anti-inflammatory medications or other culprit medications.  Daughter stated  understanding.  Regarding hematoma, recommended ice several times per day.  After history, exam, and medical workup I feel the patient has been appropriately medically screened and is safe for discharge home. Pertinent diagnoses were discussed with the patient. Patient was given return precautions.\  Final Clinical Impression(s) / ED Diagnoses Final diagnoses:  Hematoma of left hip, initial encounter  AKI (acute kidney injury) [facility])    Rx / DC Orders ED Discharge Orders    None       Malaiya Paczkowski, Mayer Masker, MD 04/29/20 [phone]

## 2020-04-29 NOTE — Discharge Instructions (Addendum)
You were seen today for a fall.  X-rays do not show any broken bones.  You do have a significant hematoma to the left hip.  Apply ice at 20-minute intervals several times a day.  You may use Tylenol 1000 mg every 8 hours as needed for pain.  You were incidentally found to have an increased creatinine at 2.0.  This can sometimes be related to dehydration or medications.  Follow-up closely with your primary physician for recheck.

## 2020-05-08 ENCOUNTER — Other Ambulatory Visit (HOSPITAL_COMMUNITY): Payer: Self-pay | Admitting: Geriatric Medicine

## 2020-06-07 ENCOUNTER — Telehealth: Payer: Self-pay

## 2020-06-07 NOTE — Telephone Encounter (Signed)
Phone call placed to patient's daughter to check in on patient and offer to schedule follow up visit with Palliative care. Daughter shared patient is now at [employer] [employer]. PCP remains Dr. Pete Glatter. Patient's daughter to discuss with her sister and call back to schedule if feels like it is needed.

## 2020-06-15 ENCOUNTER — Other Ambulatory Visit: Payer: Self-pay

## 2020-06-15 ENCOUNTER — Other Ambulatory Visit: Payer: Medicare Other | Admitting: Hospice

## 2020-06-15 DIAGNOSIS — F0390 Unspecified dementia without behavioral disturbance: Secondary | ICD-10-CM

## 2020-06-15 DIAGNOSIS — Z515 Encounter for palliative care: Secondary | ICD-10-CM

## 2020-06-15 NOTE — Progress Notes (Addendum)
[facility] Consult Note Telephone: [phone]  Fax: [phone]  PATIENT NAME: Ryan [name] [address] [place] [address] [phone] (home)  DOB: [birth year] MRN: [id number]  PRIMARY CARE PROVIDER:    Lajean Manes, MD,  [place] [employer] [address] [place] [address] [phone]  REFERRING PROVIDER:   Lajean Manes, MD [address] [place],  [place] [address] [phone]  RESPONSIBLE PARTY:    Chinita [place] is currently the [place]    Name Relation Home Work [place], [place]   [phone]   Jackson Latino Daughter   [phone]   Kai Levins Daughter   [phone]   Reola Calkins Daughter   [phone]       I met face to face with patient and family at home. Palliative Care was asked to follow this patient by consultation request of  Lajean Manes, MD to address advance care planning and complex medical decision making. This is the initial visit. Debby present with patient during visit    ASSESSMENT AND / RECOMMENDATIONS:   Advance Care Planning: Our advance care planning conversation included a discussion about:     The value and importance of advance care planning   Difference between Hospice and Palliative care  Experiences with loved ones who have been seriously ill or have died   Exploration of goals of care in the event of a sudden injury or illness   Identification and preparation of a healthcare agent   Review and updating or creation of an  advance directive document .  Decision not to resuscitate or to de-escalate disease focused treatments due to poor prognosis.  CODE STATUS: Discussion on ramifications and implications of code status. Debby affirmed patient is a Do Not Resuscitate. NP signed DNR for patient  Goals of Care: Goals include to maximize quality of life and symptom management; to keep patient out of a Memory  care facility as long as it is reasonably possible.   I spent 46  minutes providing this initial consultation. More than 50% of the time in this consultation was spent on counseling patient and coordinating communication. --------------------------------------------------------------------------------------------------------------------------------------  Symptom Management/Plan: Memory loss related to Dementia: FAST 6D, incontinent of bowel and bladder . Encourage reminiscence, word search/puzzles, cueing for recollection.  Promote calm approach and engaging environment.  Neurologist following.  Safety precautions discussed as well as the trajectory of progressive Dementia disease.  Fall:Right hip Hematoma resolved. Fall precautionsemphasized Afib: Continue Eliquis. BLE edema: Trace Edema, non pitting. Elevation encouraged and demonstrated. Compression socks during the day. No added salt, heart healthy diet.  Follow up Palliative Care visit: Palliative care will continue to follow for complex medical decision making, advance care planning, and clarification of goals. Return 6 weeks or prn.Encouraged to call provider sooner with any concerns.   PPS: 40%  HOSPICE ELIGIBILITY/DIAGNOSIS: TBD  Chief Complaint: Memory loss/Dementia  HISTORY OF PRESENT ILLNESS:  Ryan [name] is a 86 y.o. year old male  with multiple medical conditions including memory loss related to Alzheimer's Dementia ongoing and progressive  for about seven years, worsening in the past month with patient no longer able to engage in meaning conversation, impairing his independence and ADLS. Patient with  associated symptoms of incontinence gait instability and overall decline in functional status in line with Dementia disease trajectory. Reminiscence helps patient with memory loss. Patient denies pain/discomfort, no urinary symptoms.  History of HTN, Hyperlipidemia.  History obtained from review  of EMR, discussion with primary  team, and interview with facility staff/caregiver , family and/or Mr. Abarca.  Review and summarization of Epic records shows history from other than patient. Rest of 10 point ROS asked and negative.  Palliative Care was asked to follow this patient by consultation request of Lajean Manes, MD to help address complex decision making in the context of advance care planning and goals of care clarification.    Review of lab tests/diagnostics  Results for Ryan, WIEDERHOLT (MRN [id number]) as of 06/15/2020 12:07  Ref. Range 04/28/2020 23:55  Sodium Latest Ref Range: 135 - 145 mmol/L 138  Potassium Latest Ref Range: 3.5 - 5.1 mmol/L 4.3  Chloride Latest Ref Range: 98 - 111 mmol/L 104  CO2 Latest Ref Range: 22 - 32 mmol/L 25  Glucose Latest Ref Range: 70 - 99 mg/dL 111 (H)  BUN Latest Ref Range: 8 - 23 mg/dL 39 (H)  Creatinine Latest Ref Range: 0.61 - 1.24 mg/dL 2.00 (H)  Calcium Latest Ref Range: 8.9 - 10.3 mg/dL 9.1  Anion gap Latest Ref Range: 5 - 15  9  GFR, Estimated Latest Ref Range: >60 mL/min 33 (L)   Results for Ryan, [name] (MRN [id number]) as of 06/15/2020 12:07  Ref. Range 04/28/2020 23:55  WBC Latest Ref Range: 4.0 - 10.5 K/uL 10.8 (H)  RBC Latest Ref Range: 4.22 - 5.81 MIL/uL 5.21  Hemoglobin Latest Ref Range: 13.0 - 17.0 g/dL 13.7  HCT Latest Ref Range: 39.0 - 52.0 % 43.6  MCV Latest Ref Range: 80.0 - 100.0 fL 83.7  MCH Latest Ref Range: 26.0 - 34.0 pg 26.3  MCHC Latest Ref Range: 30.0 - 36.0 g/dL 31.4  RDW Latest Ref Range: 11.5 - 15.5 % 19.7 (H)  Platelets Latest Ref Range: 150 - 400 K/uL 175  nRBC Latest Ref Range: 0.0 - 0.2 % 0.0  Neutrophils Latest Units: % 75  Lymphocytes Latest Units: % 14  Monocytes Relative Latest Units: % 9  Eosinophil Latest Units: % 1  Basophil Latest Units: % 0  Immature Granulocytes Latest Units: % 1  NEUT# Latest Ref Range: 1.7 - 7.7 K/uL 8.2 (H)  Lymphocyte # Latest Ref Range: 0.7 - 4.0 K/uL 1.5  Monocyte # Latest Ref Range: 0.1 - 1.0 K/uL  1.0  Eosinophils Absolute Latest Ref Range: 0.0 - 0.5 K/uL 0.1    ROS General: NAD EYES: denies vision changes ENMT: denies dysphagia Cardiovascular: denies chest pain/discomfort Pulmonary: denies cough, denies SOB Abdomen: endorses good appetite, denies constipation GU: denies dysuria, urinary frequency MSK:  denies weakness,  no falls reported Skin: denies rashes or wounds Neurological: denies pain, denies insomnia Psych: Endorses positive mood Heme/lymph/immuno: denies bruises, abnormal bleeding  Physical Exam: Height/Weight 5 feet 9 inches Constitutional: NAD General: Well groomed EYES: anicteric sclera, lids intact, no discharge  ENMT: Moist mucous membrane CV: S1 S2, RRR, trace edema to BLE Pulmonary: LCTA, no increased work of breathing, no cough, Abdomen: active BS + 4 quadrants, soft and non tender, no ascites GU: no suprapubic tenderness MSK: weakness,  limited ROM Skin: warm and dry, no rashes or wounds on visible skin Neuro:  weakness, otherwise non focal, forgetful, memory loss Psych: non-anxious affect Hem/lymph/immuno: no widespread bruising   PAST MEDICAL HISTORY:  Active Ambulatory Problems    Diagnosis Date Noted  . Acute GI bleeding 01/30/2020  . Mixed hyperlipidemia 01/30/2020  . Benign prostatic hyperplasia 01/30/2020  . Anemia due to acute blood loss 01/30/2020  . AF (paroxysmal atrial fibrillation) (  [facility]) 01/30/2020  . GI bleed 01/31/2020  . Lower GI bleed   . Primary hypertension   . PAF (paroxysmal atrial fibrillation) ([facility]) 02/15/2020  . Alzheimer disease ([facility]) 02/15/2020   Resolved Ambulatory Problems    Diagnosis Date Noted  . No Resolved Ambulatory Problems   Past Medical History:  Diagnosis Date  . Chronic anticoagulation   . Gait disorder   . Hypercholesterolemia   . Hypertension   . Paroxysmal atrial fibrillation ([facility])    SOCIAL HX:  Social History   Tobacco Use  . Smoking status: Never Smoker  . Smokeless tobacco: Never  Used  . Tobacco comment: maybe in 20's  Substance Use Topics  . Alcohol use: Not Currently   FAMILY HX:  Father - Afib Mother - Stroke  ALLERGIES: No Known Allergies   PERTINENT MEDICATIONS:  Outpatient Encounter Medications as of 06/15/2020  Medication Sig  . atorvastatin (LIPITOR) 40 MG tablet Take 40 mg by mouth daily.  . cephALEXin (KEFLEX) 500 MG capsule TAKE 1 CAPSULE BY MOUTH 3 TIMES DAILY  . ferrous sulfate 325 (65 FE) MG EC tablet Take 1 tablet (325 mg total) by mouth 2 (two) times daily.  . melatonin 3 MG TABS tablet Take 2 tablets (6 mg total) by mouth at bedtime.  . metoprolol tartrate (LOPRESSOR) 25 MG tablet Take 0.5 tablets (12.5 mg total) by mouth 2 (two) times daily.  . Multiple Vitamin (MULTIVITAMIN) tablet Take 1 tablet by mouth daily.  . pantoprazole (PROTONIX) 40 MG tablet TAKE 1 TABLET(40 MG) BY MOUTH TWICE DAILY  . polyethylene glycol (MIRALAX / GLYCOLAX) 17 g packet in the morning and at bedtime.  . Probiotic Product (PROBIOTIC ADVANCED PO) Take 500 mg by mouth daily in the afternoon.  . tamsulosin (FLOMAX) 0.4 MG CAPS capsule Take 0.4 mg by mouth daily after supper.    No facility-administered encounter medications on file as of 06/15/2020.    Thank you for the opportunity to participate in the care of Mr. Loughry.  The palliative care team will continue to follow. Please call our office at [phone] if we can be of additional assistance.   Note: Portions of this note were generated with Lobby[employer]. Dictation errors may occur despite best attempts at proofreading.  Teodoro Spray, NP

## 2020-07-13 ENCOUNTER — Other Ambulatory Visit: Payer: Medicare Other | Admitting: Hospice

## 2020-07-13 ENCOUNTER — Other Ambulatory Visit: Payer: Self-pay

## 2020-07-13 DIAGNOSIS — Z515 Encounter for palliative care: Secondary | ICD-10-CM

## 2020-07-13 DIAGNOSIS — R269 Unspecified abnormalities of gait and mobility: Secondary | ICD-10-CM

## 2020-07-13 DIAGNOSIS — F0390 Unspecified dementia without behavioral disturbance: Secondary | ICD-10-CM

## 2020-07-13 NOTE — Progress Notes (Signed)
Therapist, nutritional Palliative Care Consult Note Telephone: [phone]  Fax: [phone]  PATIENT NAME: Ryan [name] DOB: 1934-10-24 MRN: [id number]  PRIMARY CARE PROVIDER:   [name], MD [name], MD [address] [place],  [place] [address]  REFERRING PROVIDER: [name], MD [name], MD [address] [place],  [place] [address]  RESPONSIBLE PARTY:  [name] is [facility] Spouse:Rachel Stineman: [phone] Contact Information    Name Relation Home Work [place], [name] Legal Guardian   [phone]   [name] Daughter   [phone]   [name] Daughter   [phone]   [name] Daughter   [phone]      Visit is to build trust and highlight Palliative Medicine as specialized medical care for people living with serious illness, aimed at facilitating better quality of life through symptoms relief, assisting with advance care planning and complex medical decision making.  [name] and [name] are present with patient during visit  RECOMMENDATIONS/PLAN:   Advance Care Planning/Code Status: Patient is a DO NOT RESUSCITATE  Goals of Care: Goals of care include to maximize quality of life and symptom management.    Visit consisted of counseling and education dealing with the complex and emotionally intense issues of symptom management and palliative care in the setting of serious and potentially life-threatening illness. Palliative care team will continue to support patient, patient's family, and medical team.  Symptom Management/Plan: Gait disturbance: PT/OT is ongoing.  Falls precautions.  Rolling walker and transport chair on hand.  Balance of rest and performance activity Memory loss related to Dementia: FAST 6D, incontinent of bowel and bladder.  Family aware that dementia is a progressive terminal disease. Encourage reminiscence, word search/puzzles, cueing for recollection. Promote  calm approach and engaging environment.   Afib: Continue Eliquis.Spouse not comfortable with patient getting Eliquis and omega fish oil at the same time.  She will stop giving patient the omega oil fish supplement and rather ensure adequate intake of foods with naturally [place] omega-3 oil.  Validation provided. BLE edema: Resolved.  Continue compression socks during the day and elevation of lower extremities. No added salt, heart healthy diet.  Follow up Palliative Care visit: Palliative care will continue to follow for complex medical decision making, advance care planning, and clarification of goals. Return 6 weeks or prn.Encouraged to call provider sooner with any concerns.   PPS: 40%  HOSPICE ELIGIBILITY/DIAGNOSIS: TBD  Chief Complaint: Memory loss/Dementia  HISTORY OF PRESENT ILLNESS:  Ryan [name] is a 86 y.o. year old male  with multiple medical conditions including gait disturbance, chronic, progressing, likely related to advancing dementia, resulting in a fall last month.  Gait disturbance impairs his mobility and activities of daily living. Patient denies pain/discomfort. History of Alzheimer dementia, HTN, Hyperlipidemia.  History obtained from review of EMR, discussion with primary team, and interview with family and/or Mr. Cubero.  Review and summarization of Epic records shows history from other than patient. Rest of 10 point ROS asked and negative. Review and summarization of Epic records shows history from other than patient.   Palliative Care was asked to follow this patient by consultation request of [name], MD to help address complex decision making in the context of advance care planning and goals of care clarification.   PPS: weak 50%; has transport wheelchair for extended walks ROS General: NAD EYES: denies vision changes ENMT: denies dysphagia Cardiovascular: denies chest pain/discomfort Pulmonary: denies cough, denies SOB Abdomen:  endorses good  appetite, denies constipation GU: denies dysuria, urinary frequency MSK:   Endorses weakness,  no recent fall reported Skin: denies rashes or wounds Neurological: denies pain, denies insomnia Psych: Endorses positive mood Heme/lymph/immuno: denies bruises, abnormal bleeding  Physical Exam: Height/Weight 5 feet 9 inches Constitutional: NAD General: Well groomed, cooperative EYES: anicteric sclera, lids intact, no discharge  ENMT: Moist mucous membrane CV: S1 S2, RRR,  no edema to BLE Pulmonary: LCTA, no increased work of breathing, no cough, Abdomen: active BS + 4 quadrants, soft and non tender, no ascites GU: no suprapubic tenderness MSK: weakness,  limited ROM, ambulatory with rolling walker Skin: warm and dry, no rashes or wounds on visible skin Neuro:  weakness, otherwise non focal, forgetful, memory loss Psych: non-anxious affect Hem/lymph/immuno: no widespread bruising  PERTINENT MEDICATIONS:  Outpatient Encounter Medications as of 07/13/2020  Medication Sig  . atorvastatin (LIPITOR) 40 MG tablet Take 40 mg by mouth daily.  . cephALEXin (KEFLEX) 500 MG capsule TAKE 1 CAPSULE BY MOUTH 3 TIMES DAILY  . ferrous sulfate 325 (65 FE) MG EC tablet Take 1 tablet (325 mg total) by mouth 2 (two) times daily.  . melatonin 3 MG TABS tablet Take 2 tablets (6 mg total) by mouth at bedtime.  . metoprolol tartrate (LOPRESSOR) 25 MG tablet Take 0.5 tablets (12.5 mg total) by mouth 2 (two) times daily.  . Multiple Vitamin (MULTIVITAMIN) tablet Take 1 tablet by mouth daily.  . pantoprazole (PROTONIX) 40 MG tablet TAKE 1 TABLET(40 MG) BY MOUTH TWICE DAILY  . polyethylene glycol (MIRALAX / GLYCOLAX) 17 g packet in the morning and at bedtime.  . Probiotic Product (PROBIOTIC ADVANCED PO) Take 500 mg by mouth daily in the afternoon.  . tamsulosin (FLOMAX) 0.4 MG CAPS capsule Take 0.4 mg by mouth daily after supper.    No facility-administered encounter medications on file as of 07/13/2020.     HOSPICE ELIGIBILITY/DIAGNOSIS: TBD  PAST MEDICAL HISTORY:  Past Medical History:  Diagnosis Date  . Alzheimer disease (HCC)   . Benign prostatic hyperplasia 01/30/2020  . Chronic anticoagulation    On eliquis  . Gait disorder   . Hypercholesterolemia   . Hypertension   . Mixed hyperlipidemia 01/30/2020  . Paroxysmal atrial fibrillation (H[facility])      SOCIAL HX: @SOCX  Patient lives at home with spouse for ongoing care  ALLERGIES: No Known Allergies    I spent  50 minutes providing this consultation; this includes time spent with patient/family, chart review and documentation. More than 50% of the time in this consultation was spent on counseling and coordinating communication   Thank you for the opportunity to participate in the care of ARVIL UTZ Please call our office at [phone] if we can be of additional assistance.  Note: Portions of this note were generated with [phone]. Dictation errors may occur despite best attempts at proofreading.  Scientist, clinical (histocompatibility and immunogenetics), NP

## 2020-08-18 ENCOUNTER — Other Ambulatory Visit: Payer: Self-pay | Admitting: Gastroenterology

## 2020-09-07 ENCOUNTER — Other Ambulatory Visit: Payer: Medicare Other | Admitting: Hospice

## 2020-09-07 ENCOUNTER — Other Ambulatory Visit: Payer: Self-pay

## 2020-09-07 DIAGNOSIS — F0390 Unspecified dementia without behavioral disturbance: Secondary | ICD-10-CM

## 2020-09-07 DIAGNOSIS — I480 Paroxysmal atrial fibrillation: Secondary | ICD-10-CM

## 2020-09-07 DIAGNOSIS — R600 Localized edema: Secondary | ICD-10-CM

## 2020-09-07 DIAGNOSIS — R269 Unspecified abnormalities of gait and mobility: Secondary | ICD-10-CM

## 2020-09-07 NOTE — Progress Notes (Signed)
Therapist, nutritional Palliative Care Consult Note Telephone: [phone]  Fax: [phone]  PATIENT NAME: Ryan [name] DOB: 12/30/1934 MRN: [id number]  PRIMARY CARE PROVIDER:   [name], MD [name], MD [address] [place],  [place] [address]  REFERRING PROVIDER: [name], MD [name], MD [address] [place],  [place] [address]  RESPONSIBLE PARTY:  [name] is [facility] Spouse:Rachel Licausi: [phone] Contact Information     Name Relation Home Work [place], [name] Legal Guardian   [phone]   [name] Daughter   [phone]   [name] Daughter   [phone]   [name] Daughter   [phone]       Visit is to build trust and highlight Palliative Medicine as specialized medical care for people living with serious illness, aimed at facilitating better quality of life through symptoms relief, assisting with advance care planning and complex medical decision making. This is a follow up visit.  RECOMMENDATIONS/PLAN:   Advance Care Planning/Code Status:Patient is a Do Not Resuscitate  Goals of Care: Goals of care include to maximize quality of life and symptom management.  Visit consisted of counseling and education dealing with the complex and emotionally intense issues of symptom management and palliative care in the setting of serious and potentially life-threatening illness. Palliative care team will continue to support patient, patient's family, and medical team.  Symptom management/Plan:  Dementia: Memory loss/confusion related to Dementia at baseline. FAST 6D, incontinent of bowel and bladder.  Family is aware that dementia is a progressive terminal disease. Continue reminiscence, word search/puzzles, cueing for recollection.  Promote calm approach and engaging environment.   Afib: Continue Eliquis. Gait disturbance: Falls precautions.  Rolling walker and transport chair  on hand.  Balance of rest and performance activity BLE edema: trace edema. Encouraged use of compression hose, demonstrated proper elevation on BLE . Discouraged eating the high sodium bacon in the fridge. Education provided on need for reduced salt/fluids.  Recommendation: If edema not resolved, Initiate Lasix 20mg  daily with Potassium daily. Routine CBC BMP Follow up Palliative Care visit: Palliative care will continue to follow for complex medical decision making, advance care planning, and clarification of goals. Return 6 weeks or prn.Encouraged to call provider sooner with any concerns.  CHIEF COMPLAINT: Palliative follow up  HISTORY OF PRESENT ILLNESS:  Ryan [name] is a 86 y.o. year old male  with multiple medical conditions including gait disturbance, Alzheimer dementia, HTN, Hyperlipidemia, BLE edema. No fall or hospitalization since last visit. Patient denies pain/discomfort, no cough or shortness of breath. FLACC 0. History obtained from review of EMR, discussion with primary team, and interview with family and/or Mr. Eckert. Review and summarization of Epic records shows history from other than patient. Rest of 10 point ROS asked and negative.  Review and summarization of Epic records shows history from other than patient.   Palliative Care was asked to follow this patient by consultation request of Delford Field, MD to help address complex decision making in the context of advance care planning and goals of care clarification.   PHYSICAL EXAM  Height/Weight 5 feet 9 inches BP 130/70 01 97% RA P70 R 18 Constitutional: NAD General: Well groomed, cooperative EYES: anicteric sclera, lids intact, no discharge ENMT: Moist mucous membrane CV: S1 S2, RRR,  trace edema to BLE- ankles Pulmonary: LCTA, no increased work of breathing, no cough, Abdomen: active BS + 4 quadrants, soft and non tender, no  ascites GU: no suprapubic tenderness MSK: weakness,  limited ROM, ambulatory  with rolling walker Skin: warm and dry, no rashes or wounds on visible skin Neuro:  weakness, otherwise non focal, forgetful, memory loss Psych: non-anxious affect Hem/lymph/immuno: no widespread bruising  PERTINENT MEDICATIONS:  Outpatient Encounter Medications as of 09/07/2020  Medication Sig   atorvastatin (LIPITOR) 40 MG tablet Take 40 mg by mouth daily.   cephALEXin (KEFLEX) 500 MG capsule TAKE 1 CAPSULE BY MOUTH 3 TIMES DAILY   ferrous sulfate 325 (65 FE) MG EC tablet Take 1 tablet (325 mg total) by mouth 2 (two) times daily.   melatonin 3 MG TABS tablet Take 2 tablets (6 mg total) by mouth at bedtime.   metoprolol tartrate (LOPRESSOR) 25 MG tablet Take 0.5 tablets (12.5 mg total) by mouth 2 (two) times daily.   Multiple Vitamin (MULTIVITAMIN) tablet Take 1 tablet by mouth daily.   pantoprazole (PROTONIX) 40 MG tablet TAKE 1 TABLET(40 MG) BY MOUTH TWICE DAILY   polyethylene glycol (MIRALAX / GLYCOLAX) 17 g packet in the morning and at bedtime.   Probiotic Product (PROBIOTIC ADVANCED PO) Take 500 mg by mouth daily in the afternoon.   tamsulosin (FLOMAX) 0.4 MG CAPS capsule Take 0.4 mg by mouth daily after supper.    No facility-administered encounter medications on file as of 09/07/2020.    HOSPICE ELIGIBILITY/DIAGNOSIS: TBD  PAST MEDICAL HISTORY:  Past Medical History:  Diagnosis Date   Alzheimer disease (HCC)    Benign prostatic hyperplasia 01/30/2020   Chronic anticoagulation    On eliquis   Gait disorder    Hypercholesterolemia    Hypertension    Mixed hyperlipidemia 01/30/2020   Paroxysmal atrial fibrillation (HCC)     ALLERGIES: No Known Allergies    I spent 45 minutes providing this consultation; this includes time spent with patient/family, chart review and documentation. More than 50% of the time in this consultation was spent on counseling and coordinating communication   Thank you for the opportunity to participate in the care of Ryan [name] Please call our  office at [phone] if we can be of additional assistance.  Note: Portions of this note were generated with Scientist, clinical (histocompatibility and immunogenetics). Dictation errors may occur despite best attempts at proofreading.  Rosaura Carpenter, NP

## 2020-10-17 ENCOUNTER — Emergency Department (HOSPITAL_COMMUNITY): Payer: Medicare Other

## 2020-10-17 ENCOUNTER — Ambulatory Visit (HOSPITAL_COMMUNITY)
Admission: EM | Admit: 2020-10-17 | Discharge: 2020-10-17 | Disposition: A | Discharge status: Home / Self Care | Payer: Medicare Other

## 2020-10-17 ENCOUNTER — Emergency Department (HOSPITAL_COMMUNITY)
Admission: EM | Admit: 2020-10-17 | Discharge: 2020-10-17 | Disposition: A | Discharge status: Home / Self Care | Payer: Medicare Other | Attending: Emergency Medicine | Admitting: Emergency Medicine

## 2020-10-17 ENCOUNTER — Other Ambulatory Visit: Payer: Self-pay

## 2020-10-17 ENCOUNTER — Encounter (HOSPITAL_COMMUNITY): Payer: Self-pay | Admitting: Physician Assistant

## 2020-10-17 DIAGNOSIS — Z79899 Other long term (current) drug therapy: Secondary | ICD-10-CM | POA: Diagnosis not present

## 2020-10-17 DIAGNOSIS — Z20822 Contact with and (suspected) exposure to covid-19: Secondary | ICD-10-CM | POA: Diagnosis not present

## 2020-10-17 DIAGNOSIS — I4891 Unspecified atrial fibrillation: Secondary | ICD-10-CM | POA: Insufficient documentation

## 2020-10-17 DIAGNOSIS — R791 Abnormal coagulation profile: Secondary | ICD-10-CM | POA: Insufficient documentation

## 2020-10-17 DIAGNOSIS — Z8673 Personal history of transient ischemic attack (TIA), and cerebral infarction without residual deficits: Secondary | ICD-10-CM

## 2020-10-17 DIAGNOSIS — Q670 Congenital facial asymmetry: Secondary | ICD-10-CM

## 2020-10-17 DIAGNOSIS — Z96649 Presence of unspecified artificial hip joint: Secondary | ICD-10-CM | POA: Diagnosis not present

## 2020-10-17 DIAGNOSIS — R29810 Facial weakness: Secondary | ICD-10-CM | POA: Diagnosis not present

## 2020-10-17 DIAGNOSIS — R4182 Altered mental status, unspecified: Secondary | ICD-10-CM | POA: Diagnosis not present

## 2020-10-17 DIAGNOSIS — F05 Delirium due to known physiological condition: Secondary | ICD-10-CM

## 2020-10-17 DIAGNOSIS — Z7901 Long term (current) use of anticoagulants: Secondary | ICD-10-CM | POA: Diagnosis not present

## 2020-10-17 DIAGNOSIS — H538 Other visual disturbances: Secondary | ICD-10-CM | POA: Insufficient documentation

## 2020-10-17 DIAGNOSIS — F0390 Unspecified dementia without behavioral disturbance: Secondary | ICD-10-CM | POA: Diagnosis not present

## 2020-10-17 DIAGNOSIS — R600 Localized edema: Secondary | ICD-10-CM | POA: Insufficient documentation

## 2020-10-17 DIAGNOSIS — R4781 Slurred speech: Secondary | ICD-10-CM

## 2020-10-17 DIAGNOSIS — G309 Alzheimer's disease, unspecified: Secondary | ICD-10-CM | POA: Diagnosis not present

## 2020-10-17 DIAGNOSIS — M7989 Other specified soft tissue disorders: Secondary | ICD-10-CM | POA: Diagnosis not present

## 2020-10-17 DIAGNOSIS — I10 Essential (primary) hypertension: Secondary | ICD-10-CM | POA: Diagnosis not present

## 2020-10-17 DIAGNOSIS — F0280 Dementia in other diseases classified elsewhere without behavioral disturbance: Secondary | ICD-10-CM | POA: Insufficient documentation

## 2020-10-17 LAB — COMPREHENSIVE METABOLIC PANEL
ALT: 28 U/L (ref 0–44)
AST: 28 U/L (ref 15–41)
Albumin: 3.5 g/dL (ref 3.5–5.0)
Alkaline Phosphatase: 66 U/L (ref 38–126)
Anion gap: 9 (ref 5–15)
BUN: 28 mg/dL — ABNORMAL HIGH (ref 8–23)
CO2: 26 mmol/L (ref 22–32)
Calcium: 8.8 mg/dL — ABNORMAL LOW (ref 8.9–10.3)
Chloride: 105 mmol/L (ref 98–111)
Creatinine, Ser: 1.17 mg/dL (ref 0.61–1.24)
GFR, Estimated: >60 mL/min (ref >60)
Glucose, Bld: 119 mg/dL — ABNORMAL HIGH (ref 70–99)
Potassium: 4 mmol/L (ref 3.5–5.1)
Sodium: 140 mmol/L (ref 135–145)
Total Bilirubin: 0.9 mg/dL (ref 0.3–1.2)
Total Protein: 6.6 g/dL (ref 6.5–8.1)

## 2020-10-17 LAB — RESP PANEL BY RT-PCR (FLU A&B, COVID) ARPGX2
Influenza A by PCR: NEGATIVE
Influenza B by PCR: NEGATIVE
SARS Coronavirus 2 by RT PCR: NEGATIVE

## 2020-10-17 LAB — CBC
HCT: 43.1 % (ref 39.0–52.0)
Hemoglobin: 14 g/dL (ref 13.0–17.0)
MCH: 28.6 pg (ref 26.0–34.0)
MCHC: 32.5 g/dL (ref 30.0–36.0)
MCV: 88 fL (ref 80.0–100.0)
Platelets: 153 10*3/uL (ref 150–400)
RBC: 4.9 MIL/uL (ref 4.22–5.81)
RDW: 15.9 % — ABNORMAL HIGH (ref 11.5–15.5)
WBC: 6.2 10*3/uL (ref 4.0–10.5)
nRBC: 0 % (ref 0.0–0.2)

## 2020-10-17 LAB — DIFFERENTIAL
Abs Immature Granulocytes: 0.04 10*3/uL (ref 0.00–0.07)
Basophils Absolute: 0 10*3/uL (ref 0.0–0.1)
Basophils Relative: 1 %
Eosinophils Absolute: 0.2 10*3/uL (ref 0.0–0.5)
Eosinophils Relative: 3 %
Immature Granulocytes: 1 %
Lymphocytes Relative: 20 %
Lymphs Abs: 1.2 10*3/uL (ref 0.7–4.0)
Monocytes Absolute: 0.7 10*3/uL (ref 0.1–1.0)
Monocytes Relative: 11 %
Neutro Abs: 4 10*3/uL (ref 1.7–7.7)
Neutrophils Relative %: 64 %

## 2020-10-17 LAB — I-STAT CHEM 8, ED
BUN: 28 mg/dL — ABNORMAL HIGH (ref 8–23)
Calcium, Ion: 1.13 mmol/L — ABNORMAL LOW (ref 1.15–1.40)
Chloride: 105 mmol/L (ref 98–111)
Creatinine, Ser: 1.2 mg/dL (ref 0.61–1.24)
Glucose, Bld: 113 mg/dL — ABNORMAL HIGH (ref 70–99)
HCT: 42 % (ref 39.0–52.0)
Hemoglobin: 14.3 g/dL (ref 13.0–17.0)
Potassium: 3.9 mmol/L (ref 3.5–5.1)
Sodium: 143 mmol/L (ref 135–145)
TCO2: 27 mmol/L (ref 22–32)

## 2020-10-17 LAB — APTT: aPTT: 35 seconds (ref 24–36)

## 2020-10-17 LAB — PROTIME-INR
INR: 1.2 (ref 0.8–1.2)
Prothrombin Time: 15.3 seconds — ABNORMAL HIGH (ref 11.4–15.2)

## 2020-10-17 LAB — ETHANOL: Alcohol, Ethyl (B): <10 mg/dL (ref <10)

## 2020-10-17 LAB — CBG MONITORING, ED: Glucose-Capillary: 104 mg/dL — ABNORMAL HIGH (ref 70–99)

## 2020-10-17 IMAGING — CT CT HEAD CODE STROKE
4 series · 16 of 47 positions shown, 18 images · non-contrast
Comparison: None.

CLINICAL DATA: Code stroke. Neuro deficit, acute, stroke suspected.
Right facial droop.

EXAM:
CT HEAD WITHOUT CONTRAST
TECHNIQUE: Contiguous axial images were obtained from the base of the skull
through the vertex without intravenous contrast.

[Series 3: head 5.0 st · axial · 0.41mm/px · z∈[-110,+10]mm · 7 of 34 slices shown, 9 images]
[im 5/34  brain]
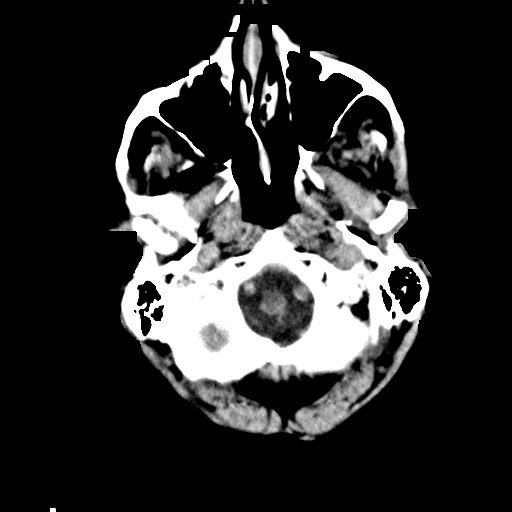
[im 5/34  bone]
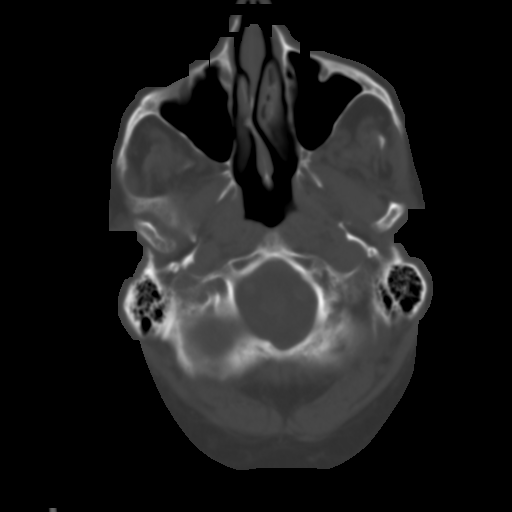
[im 9/34  brain]
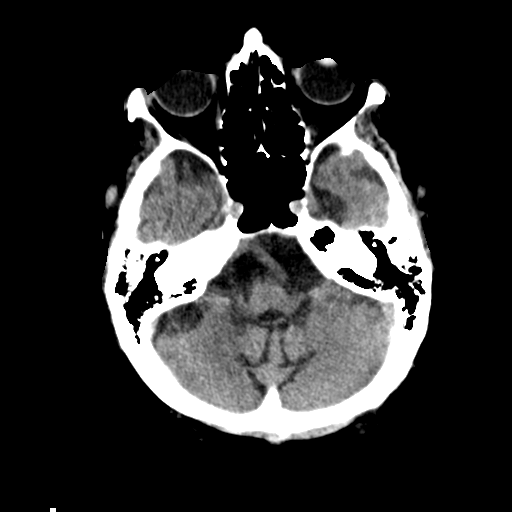
[im 13/34  brain]
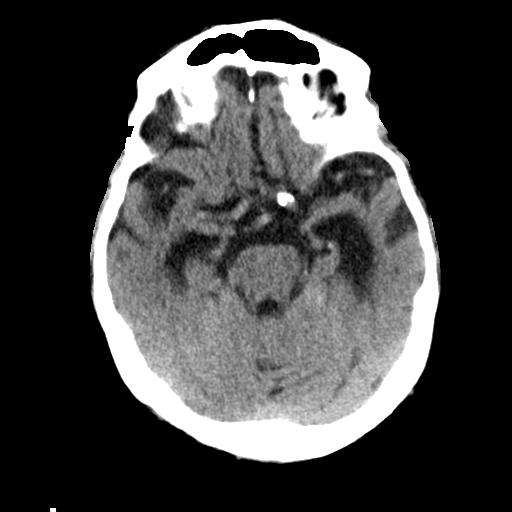
[im 17/34  brain]
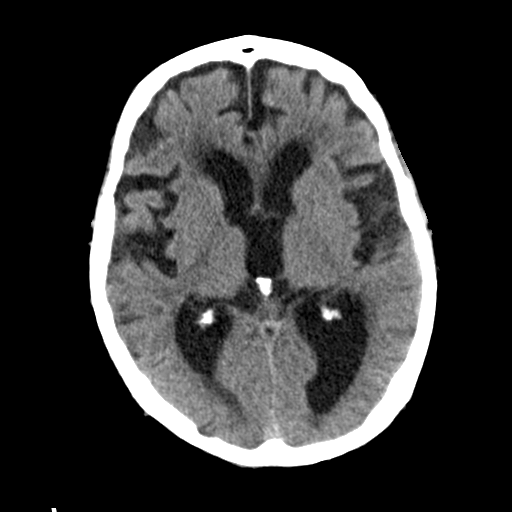
[im 21/34  brain]
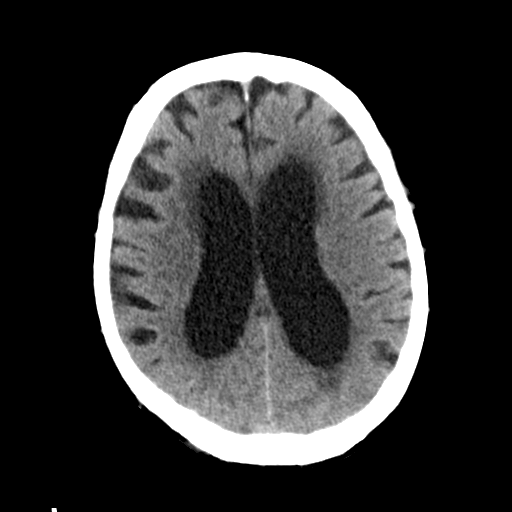
[im 21/34  bone]
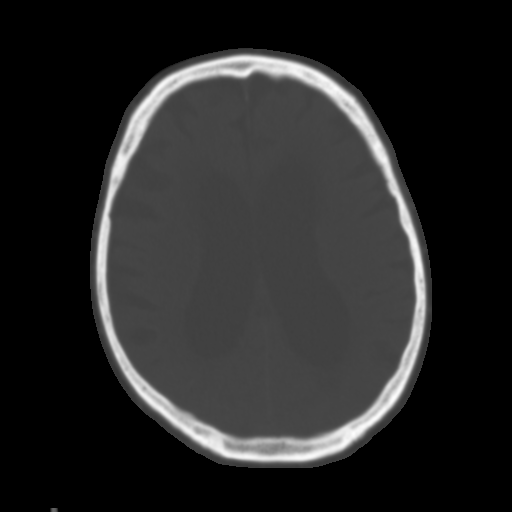
[im 25/34  brain]
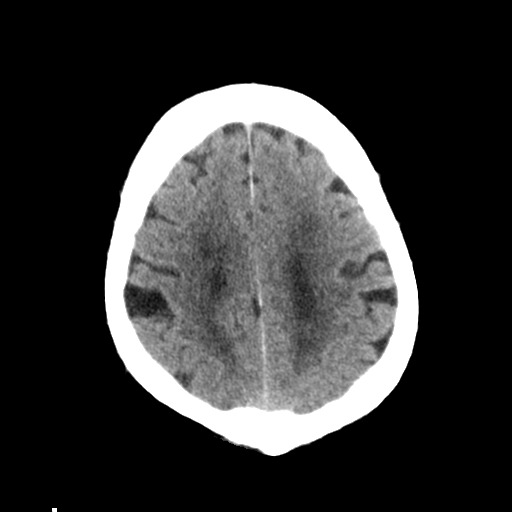
[im 29/34  brain]
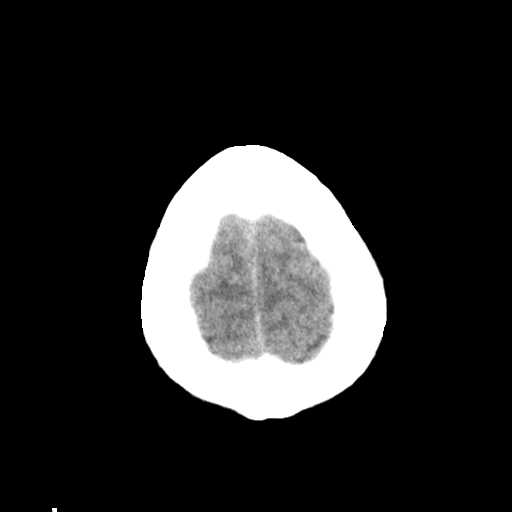

[Series 4: head 2.0 bone · axial · 0.41mm/px · z∈[-114,-82]mm · 3 of 83 slices shown]
[im 9/83  bone]
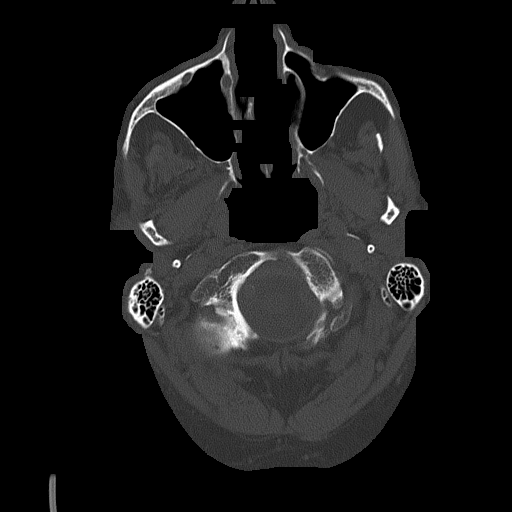
[im 17/83  bone]
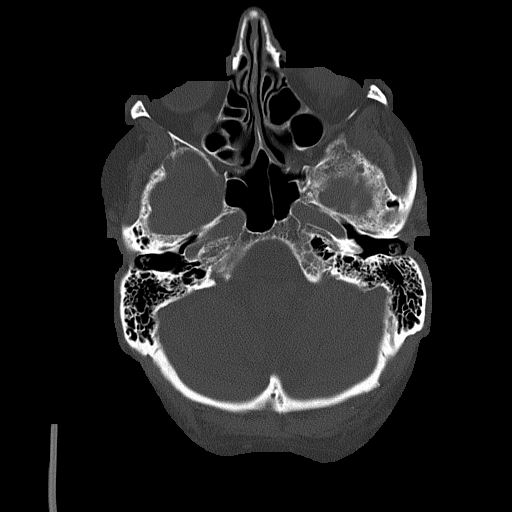
[im 25/83  bone]
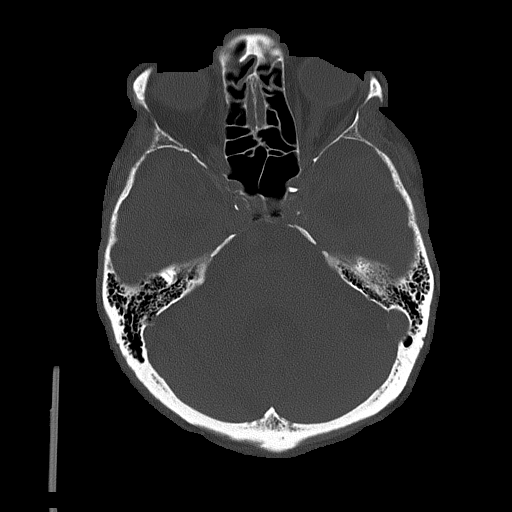

[Series 5: head 3.0 cor st · coronal · 0.34mm/px · 3 of 65 slices shown]
[im 22/65  brain]
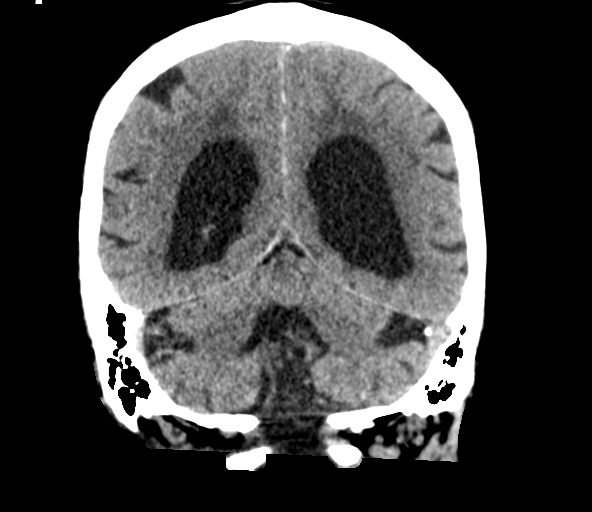
[im 29/65  brain]
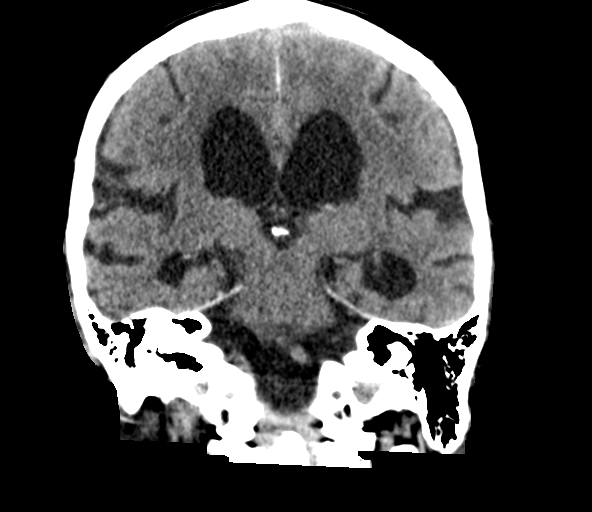
[im 36/65  brain]
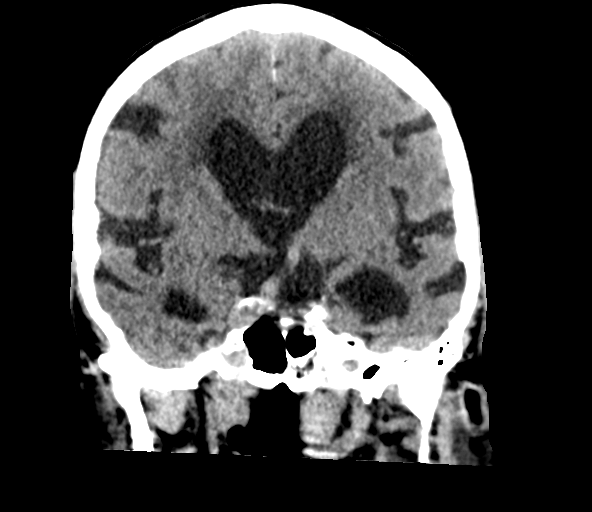

[Series 6: head 3.0 sag st · sagittal · 0.32mm/px · 3 of 55 slices shown]
[im 19/55  brain]
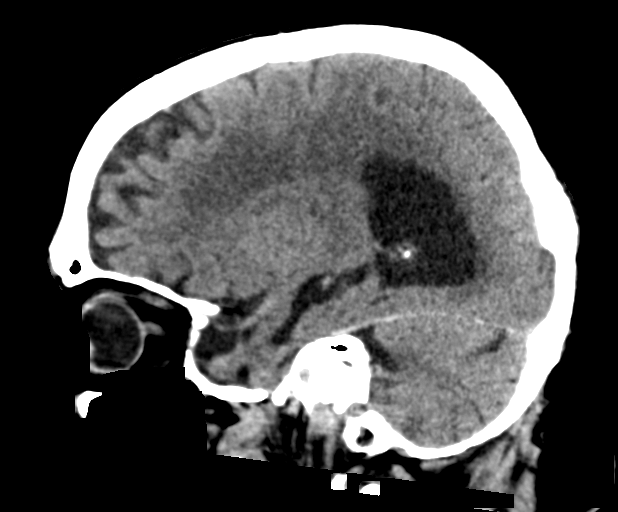
[im 28/55  brain]
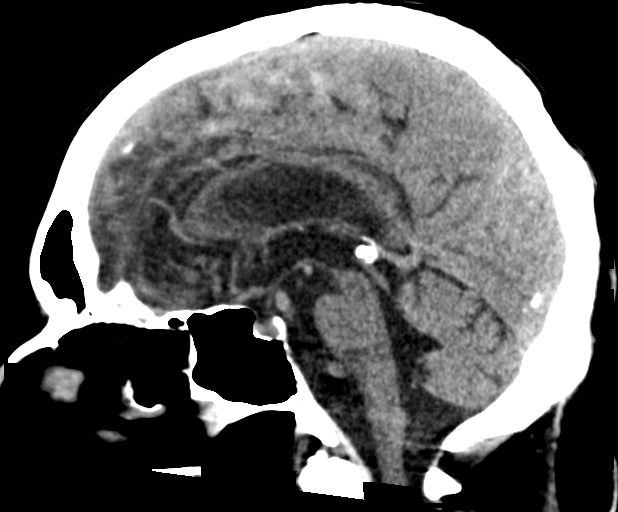
[im 37/55  brain]
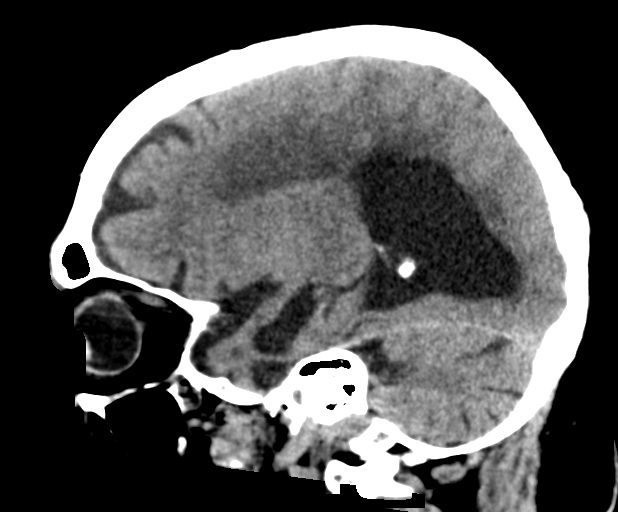

[16 of 47 positions shown; findings below may reference images not displayed]

FINDINGS: Brain: Generalized brain atrophy. Chronic small-vessel ischemic
changes affecting the pons and cerebral hemispheric white matter. No
sign of acute infarction, mass lesion, hemorrhage, hydrocephalus or
extra-axial collection. Ventriculomegaly is in proportion to the
degree atrophy.

Vascular: There is atherosclerotic calcification of the major
vessels at the base of the brain.

Skull: Negative

Sinuses/Orbits: Clear/normal

Other: None

ASPECTS (Alberta Stroke Program Early CT Score)

- Ganglionic level infarction (caudate, lentiform nuclei, internal
capsule, insula, M1-M3 cortex): 7

- Supraganglionic infarction (M4-M6 cortex): 3

Total score (0-10 with 10 being normal): 10
IMPRESSION: 1. No acute CT finding. Generalized atrophy with chronic
small-vessel ischemic changes of the pons and cerebral hemispheric
white matter.
2. ASPECTS is 10
3. These results were communicated to Dr. AGAETISSON at [DATE] on
[DATE] by text page via the [employer] messaging system.

## 2020-10-17 IMAGING — MR MR HEAD W/O CM
10 of 11 series · 43 of 48 positions shown · non-contrast
Comparison: None.

CLINICAL DATA: Left facial droop and blurry vision

EXAM:
MRI HEAD WITHOUT CONTRAST
TECHNIQUE: Multiplanar, multiecho pulse sequences of the brain and surrounding
structures were obtained without intravenous contrast.

[Series 5: DWI · axial · 3.0mm · 0.86mm/px · z∈[-42,+104]mm · 9 of 100 slices shown (1 of 4)]
[im 1/100]
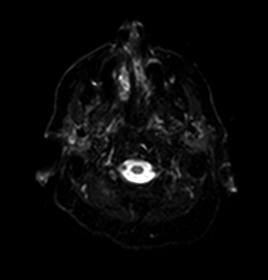
[im 13/100]
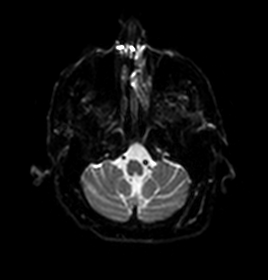
[im 25/100]
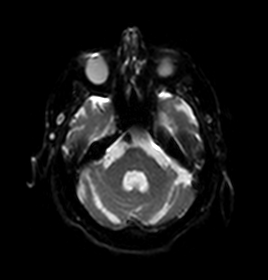
[im 38/100]
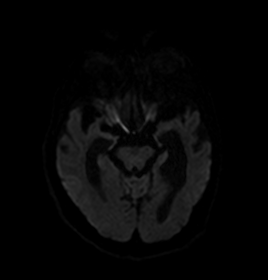
[im 50/100]
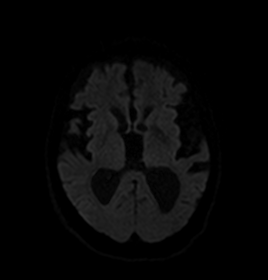
[im 62/100]
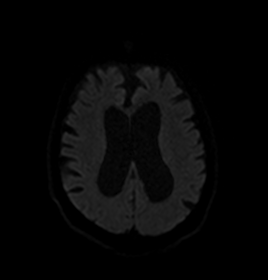
[im 75/100]
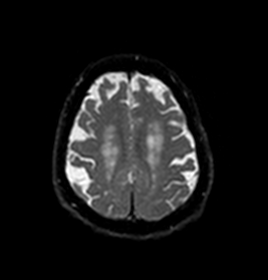
[im 87/100]
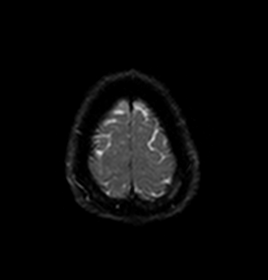
[im 100/100]
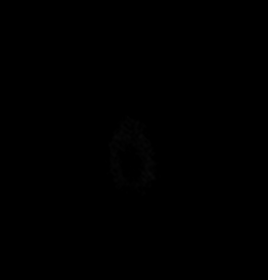

[Series 6: DWI · axial · 3.0mm · 0.86mm/px · z∈[-42,+104]mm · 5 of 50 slices shown (2 of 4)]
[im 1/50]
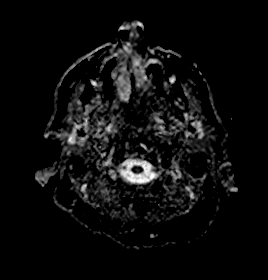
[im 13/50]
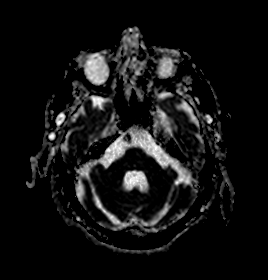
[im 25/50]
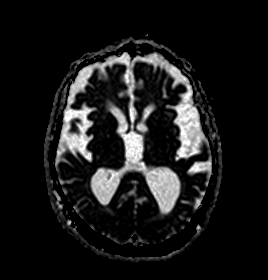
[im 37/50]
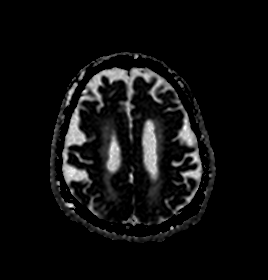
[im 50/50]
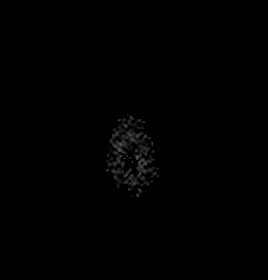

[Series 7: DWI · coronal · 4.0mm · 0.88mm/px · 7 of 72 slices shown (3 of 4)]
[im 1/72]
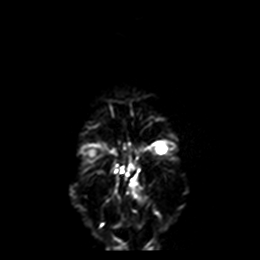
[im 12/72]
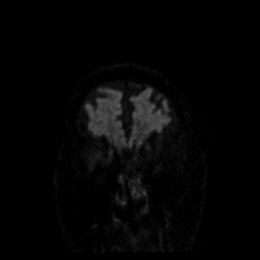
[im 24/72]
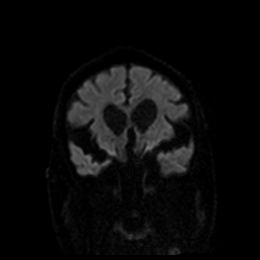
[im 36/72]
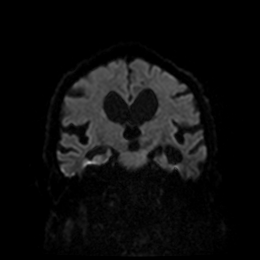
[im 48/72]
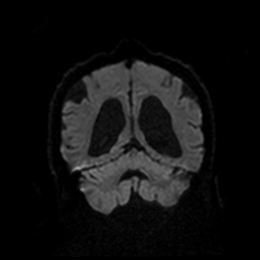
[im 60/72]
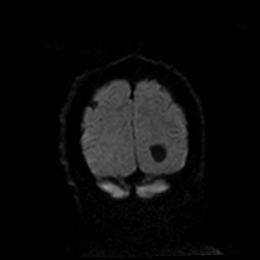
[im 72/72]
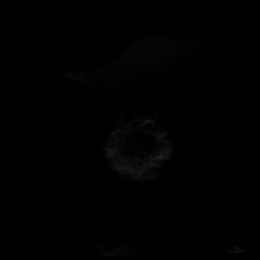

[Series 8: DWI · coronal · 4.0mm · 0.88mm/px · 3 of 36 slices shown (4 of 4)]
[im 1/36]
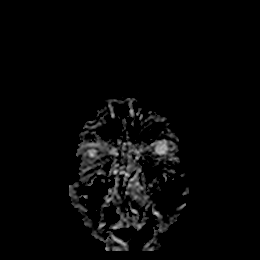
[im 18/36]
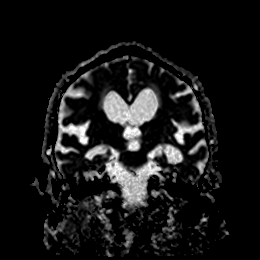
[im 36/36]
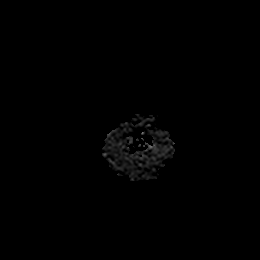

[Series 9: FLAIR · axial · 5.0mm · 0.45mm/px · z∈[-46,+98]mm · 2 of 25 slices shown]
[im 1/25]
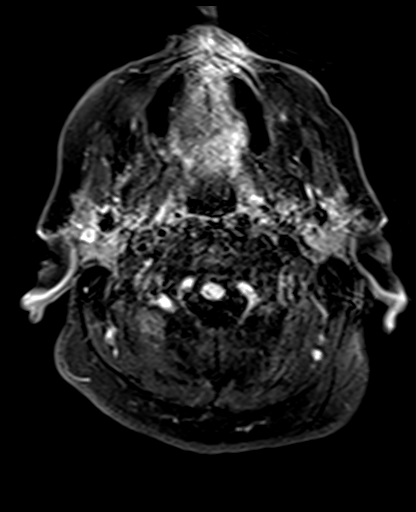
[im 25/25]
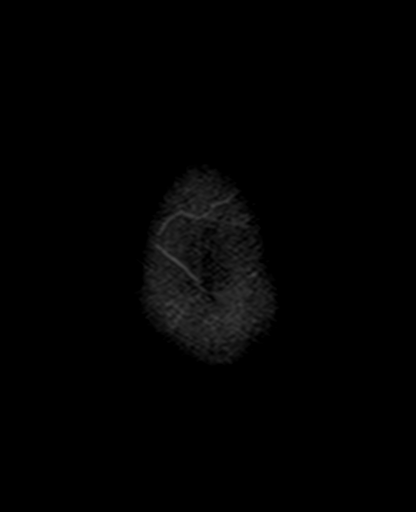

[Series 11: pha_images · axial · 3.0mm · 0.90mm/px · z∈[-50,+97]mm · 5 of 50 slices shown]
[im 1/50]
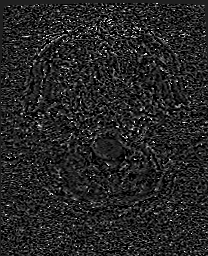
[im 13/50]
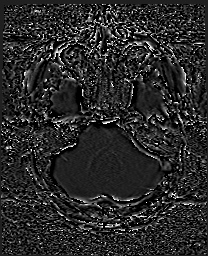
[im 25/50]
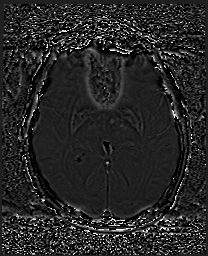
[im 37/50]
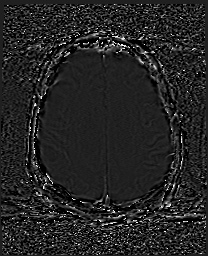
[im 50/50]
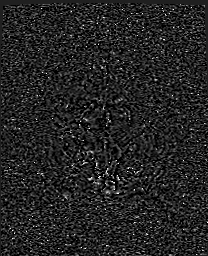

[Series 12: swi_images · axial · 3.0mm · 0.90mm/px · z∈[-50,+103]mm · 5 of 52 slices shown]
[im 1/52]
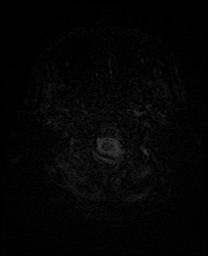
[im 13/52]
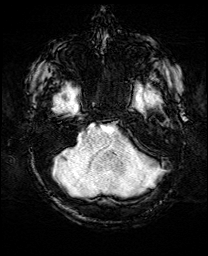
[im 26/52]
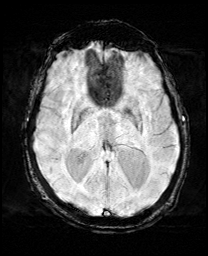
[im 39/52]
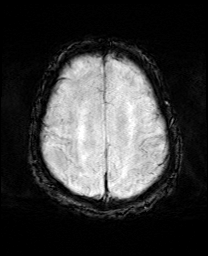
[im 52/52]
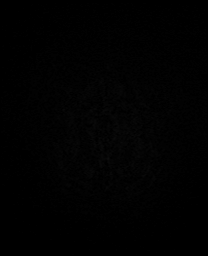

[Series 14: T1 · sagittal · 5.0mm · 0.75mm/px · 2 of 25 slices shown]
[im 1/25]
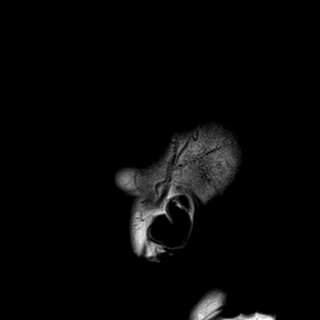
[im 25/25]
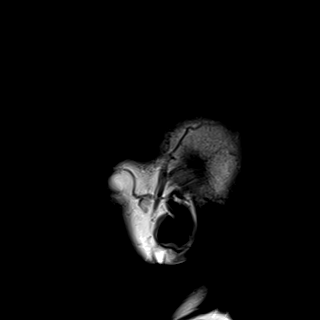

[Series 15: T2 · axial · 5.0mm · 0.72mm/px · z∈[-44,+99]mm · 2 of 25 slices shown (1 of 2)]
[im 1/25]
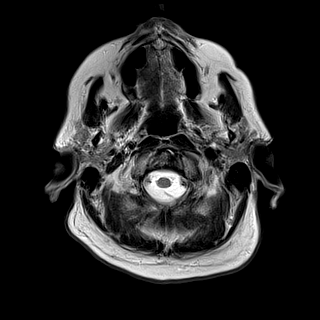
[im 25/25]
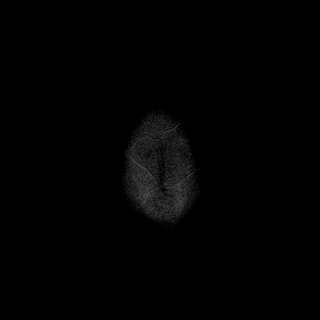

[Series 17: T2 · coronal · 5.0mm · 0.34mm/px · 3 of 29 slices shown (2 of 2)]
[im 1/29]
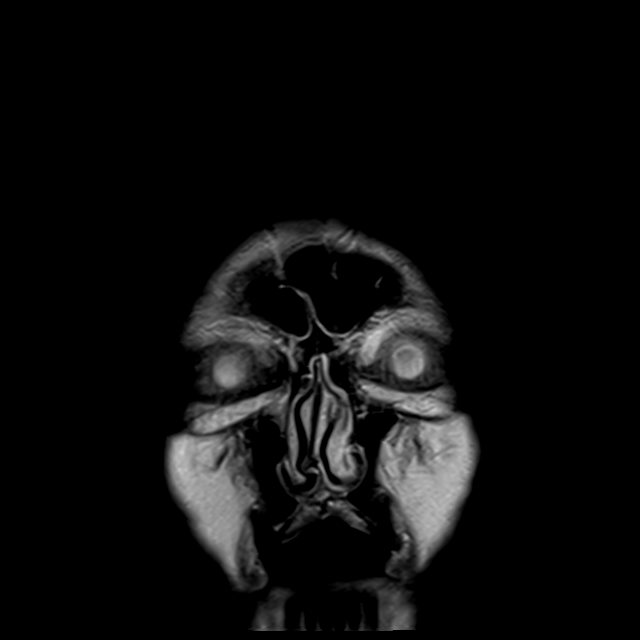
[im 15/29]
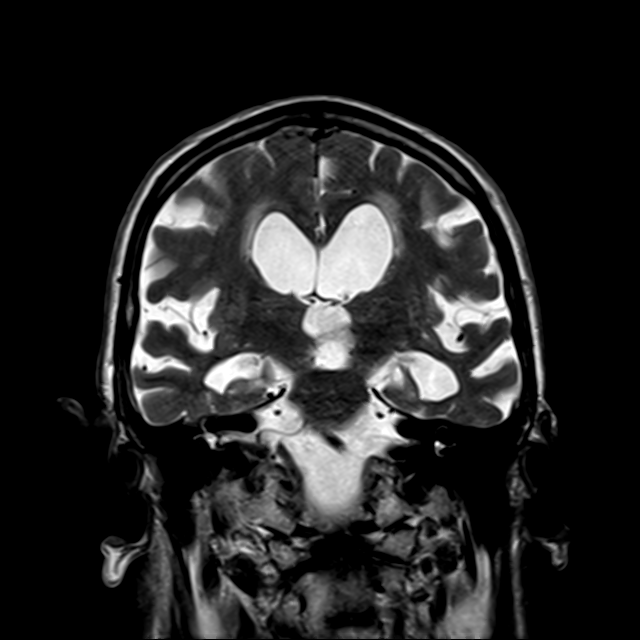
[im 29/29]
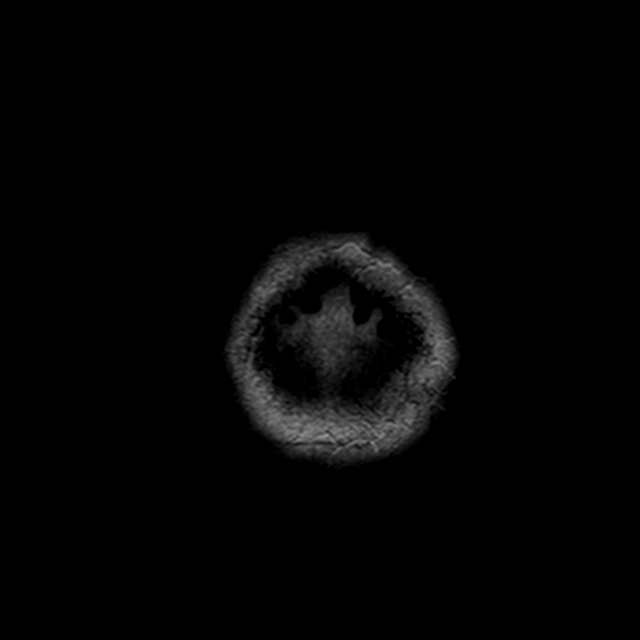

[43 of 48 positions shown; findings below may reference images not displayed]

FINDINGS: Brain: No acute infarct, mass effect or extra-axial collection. No
acute or chronic hemorrhage. There is multifocal hyperintense
T2-weighted signal within the white matter. Diffuse, severe atrophy.
The midline structures are normal.

Vascular: Major flow voids are preserved.

Skull and upper cervical spine: Normal calvarium and skull base.
Visualized upper cervical spine and soft tissues are normal.

Sinuses/Orbits:No paranasal sinus fluid levels or advanced mucosal
thickening. No mastoid or middle ear effusion. Normal orbits.
IMPRESSION: 1. No acute intracranial abnormality.
2. Severe atrophy and findings of chronic microvascular disease.

## 2020-10-17 IMAGING — DX DG CHEST 1V PORT
1 series · 1 of 1 positions shown · non-contrast
Comparison: None.

CLINICAL DATA: Altered mental status.

EXAM:
PORTABLE CHEST 1 VIEW

[chest ap]
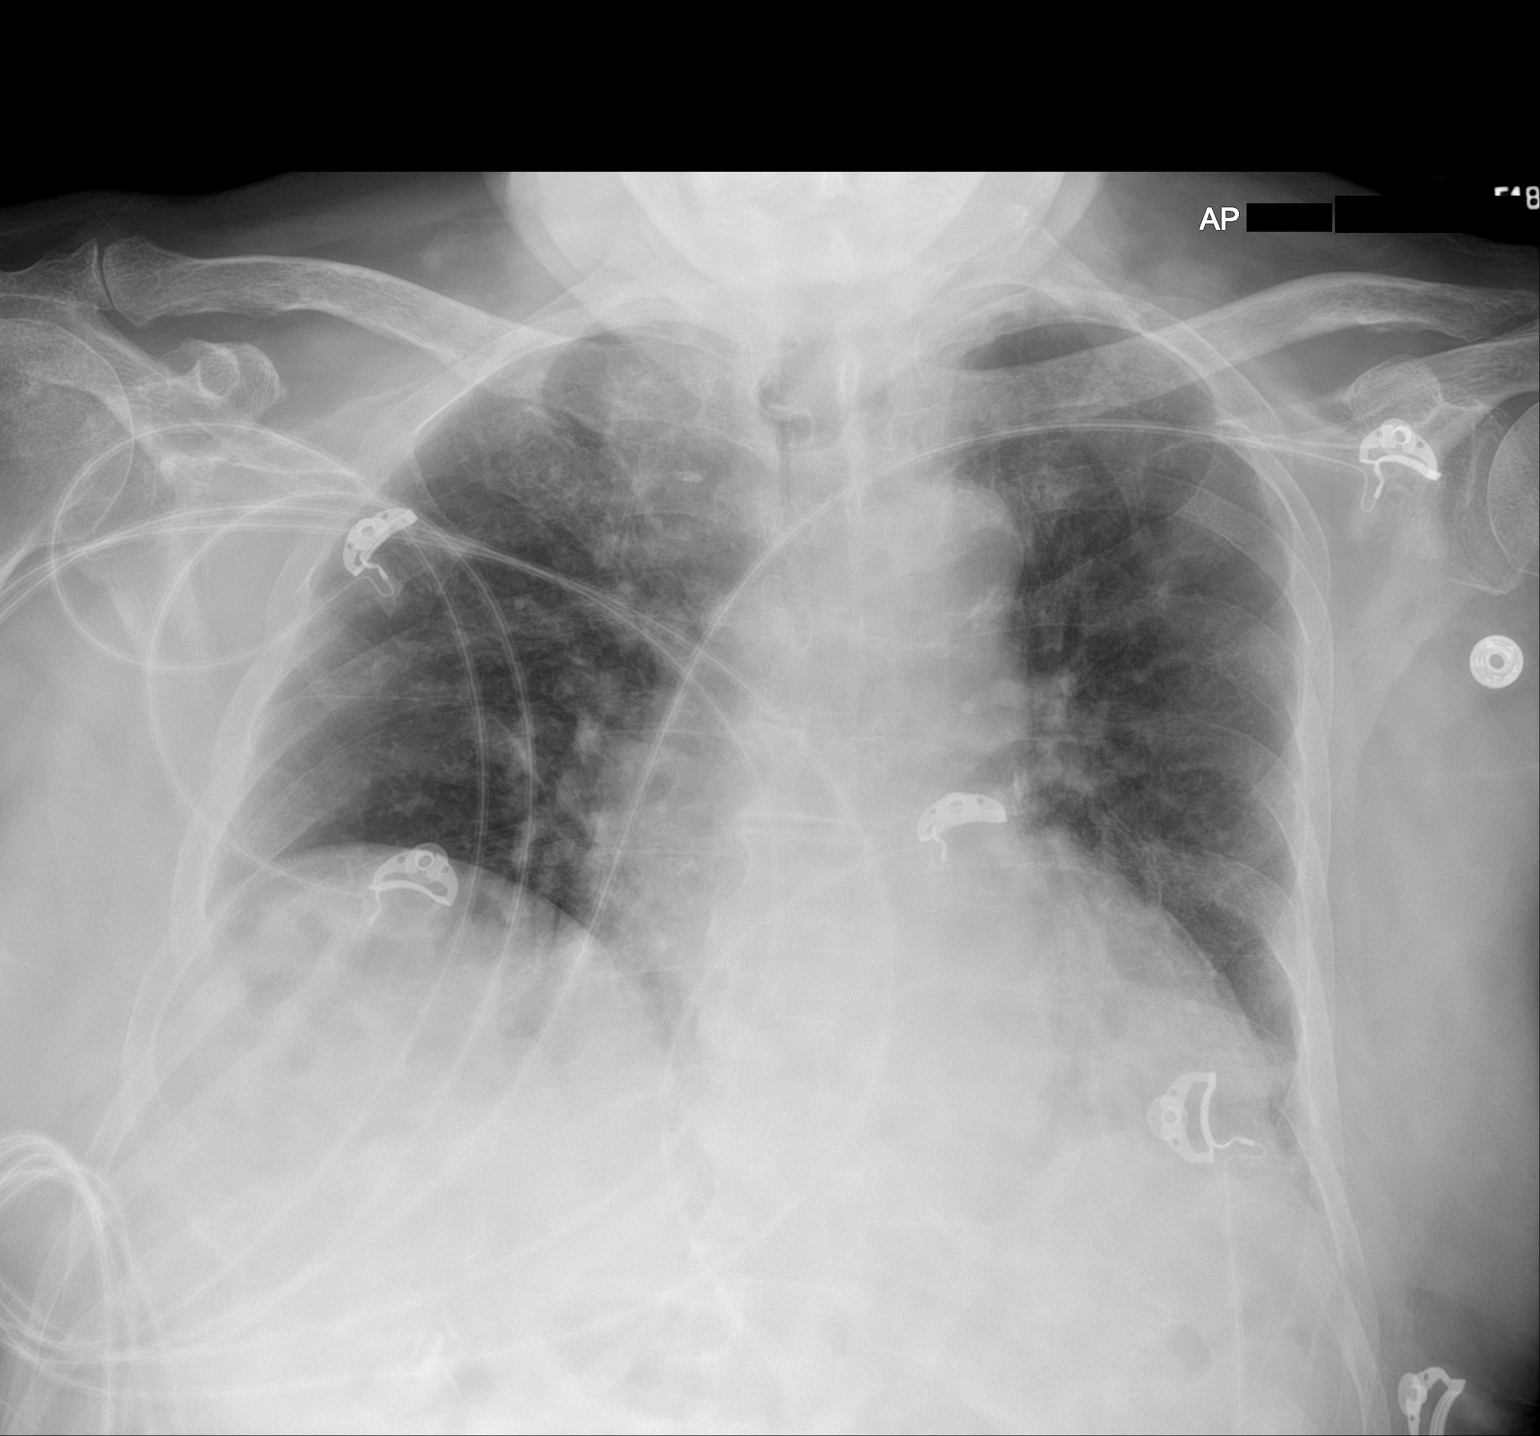

[1 of 1 positions shown; findings below may reference images not displayed]

FINDINGS: Low lung volumes. Borderline heart size. Aortic atherosclerosis and
tortuosity. Bronchovascular crowding versus vascular congestion.
Minimal streaky opacity at the left lung base. No pleural fluid or
pneumothorax. No acute osseous abnormalities seen.
IMPRESSION: 1. Low lung volumes with bronchovascular crowding versus vascular
congestion.
2.  Aortic Atherosclerosis ([5H]-[5H]).
3. Minimal streaky opacity at the left lung base, favor atelectasis.

## 2020-10-17 NOTE — ED Notes (Signed)
Discharge instructions and follow up care reviewed and explained to pt and pt's daughter. Pt's daughter verbalized understanding.

## 2020-10-17 NOTE — ED Provider Notes (Signed)
Emergency Medicine Provider Triage Evaluation Note  Ryan [name] , a 86 y.o. male  was evaluated in triage.  Pt complains of right-sided facial droop and right-sided vision changes.  History provided by daughter at the bedside.  He went to sleep at 3 PM to take a nap.  Woke about 5 PM and noticed that he had a facial droop.  He told her that something was not right.  She states that he had trouble seeing out of his right eye when she assessed him.  History of prior stroke, PE, dementia he is currently on anticoagulant.  Review of Systems  Positive: Right-sided facial droop Negative: Numbness, weakness  Physical Exam  BP (!) 169/99 (BP Location: Left Arm)   Pulse 71   Temp 98.1 F (36.7 C)   Resp 18   SpO2 98%  Gen:   Awake, no distress   Resp:  Normal effort  MSK:   Moves extremities without difficulty  Other:  Right-sided facial droop, strength 5/5 in bilateral upper and lower extremities  Medical Decision Making  Medically screening exam initiated at 7:18 PM.  Appropriate orders placed.  Delfina Redwood was informed that the remainder of the evaluation will be completed by another provider, this initial triage assessment does not replace that evaluation, and the importance of remaining in the ED until their evaluation is complete.  Code stroke patient wheeled to CT   Dietrich Pates, PA-C 10/17/20 1923    Mancel Bale, MD 10/19/20 1346

## 2020-10-17 NOTE — ED Provider Notes (Signed)
[facility] EMERGENCY DEPARTMENT Provider Note   CSN: [id number] Arrival date & time: 10/17/20  1906     History Chief Complaint  Patient presents with   Code Stroke    Ryan [name] is a 86 y.o. male.  Overall level 5 caveat due to some dementia and memory issues however patient denies any pain.  Daughter states that patient went to bed for a nap at 3 PM and woke up may be with right-sided facial droop, may be blurry vision, may be some strange movements of his head.  Patient is on Eliquis.  Denies any headaches.  Code stroke was initiated in triage.  Last dose of Eliquis was this morning.  The history is provided by the patient and a caregiver.  Neurologic Problem This is a new problem. The current episode started 3 to 5 hours ago. Pertinent negatives include no chest pain, no abdominal pain, no headaches and no shortness of breath. Nothing aggravates the symptoms. Nothing relieves the symptoms.      Past Medical History:  Diagnosis Date   Alzheimer disease (HCC)    Benign prostatic hyperplasia 01/30/2020   Chronic anticoagulation    On eliquis   Gait disorder    Hypercholesterolemia    Hypertension    Mixed hyperlipidemia 01/30/2020   Paroxysmal atrial fibrillation [facility])     Patient Active Problem List   Diagnosis Date Noted   PAF (paroxysmal atrial fibrillation) (HCC) 02/15/2020   Alzheimer disease (HCC) 02/15/2020   GI bleed 01/31/2020   Lower GI bleed    Primary hypertension    Acute GI bleeding 01/30/2020   Mixed hyperlipidemia 01/30/2020   Benign prostatic hyperplasia 01/30/2020   Anemia due to acute blood loss 01/30/2020   AF (paroxysmal atrial fibrillation) (HCC) 01/30/2020    Past Surgical History:  Procedure Laterality Date   BIOPSY  02/05/2020   Procedure: BIOPSY;  Surgeon: Lemar Lofty., MD;  Location: [facility] ENDOSCOPY;  Service: Gastroenterology;;   CHOLECYSTECTOMY     ESOPHAGOGASTRODUODENOSCOPY (EGD) WITH PROPOFOL N/A  02/05/2020   Procedure: ESOPHAGOGASTRODUODENOSCOPY (EGD) WITH PROPOFOL;  Surgeon: Lemar Lofty., MD;  Location: [facility] ENDOSCOPY;  Service: Gastroenterology;  Laterality: N/A;   FLEXIBLE SIGMOIDOSCOPY N/A 02/05/2020   Procedure: FLEXIBLE SIGMOIDOSCOPY;  Surgeon: Meridee Score Netty Starring., MD;  Location: [facility] ENDOSCOPY;  Service: Gastroenterology;  Laterality: N/A;   SAVORY DILATION N/A 02/05/2020   Procedure: SAVORY DILATION;  Surgeon: Meridee Score Netty Starring., MD;  Location: [facility] ENDOSCOPY;  Service: Gastroenterology;  Laterality: N/A;   TOTAL HIP ARTHROPLASTY         No family history on file.  Social History   Tobacco Use   Smoking status: Never   Smokeless tobacco: Never   Tobacco comments:    maybe in 20's  Substance Use Topics   Alcohol use: Not Currently   Drug use: Never    Home Medications Prior to Admission medications   Medication Sig Start Date End Date Taking? Authorizing Provider  atorvastatin (LIPITOR) 40 MG tablet Take 40 mg by mouth daily.    [provider]  cephALEXin (KEFLEX) 500 MG capsule TAKE 1 CAPSULE BY MOUTH 3 TIMES DAILY 05/08/20 05/08/21  Stoneking, Ann Maki, MD  ferrous sulfate 325 (65 FE) MG EC tablet Take 1 tablet (325 mg total) by mouth 2 (two) times daily. 02/06/20 02/05/21  Dorcas Carrow, MD  melatonin 3 MG TABS tablet Take 2 tablets (6 mg total) by mouth at bedtime. 02/05/20   Swayze, Ava, DO  metoprolol tartrate (LOPRESSOR)  25 MG tablet Take 0.5 tablets (12.5 mg total) by mouth 2 (two) times daily. 02/15/20 05/15/20  Christell Constanthandrasekhar, Mahesh A, MD  Multiple Vitamin (MULTIVITAMIN) tablet Take 1 tablet by mouth daily.    [provider]  pantoprazole (PROTONIX) 40 MG tablet TAKE 1 TABLET(40 MG) BY MOUTH TWICE DAILY 08/21/20   Mansouraty, Netty StarringGabriel Jr., MD  polyethylene glycol (MIRALAX / GLYCOLAX) 17 g packet in the morning and at bedtime. 02/14/20   [provider]  Probiotic Product (PROBIOTIC ADVANCED PO) Take 500 mg by mouth daily in the  afternoon.    [provider]  tamsulosin (FLOMAX) 0.4 MG CAPS capsule Take 0.4 mg by mouth daily after supper.     [provider]    Allergies    Patient has no known allergies.  Review of Systems   Review of Systems  Reason unable to perform ROS: Review of systems provided mostly by caregiver, patient denied pain.  Constitutional:  Negative for chills and fever.  HENT:  Negative for ear pain and sore throat.   Eyes:  Positive for visual disturbance. Negative for pain.  Respiratory:  Negative for cough and shortness of breath.   Cardiovascular:  Negative for chest pain and palpitations.  Gastrointestinal:  Negative for abdominal pain and vomiting.  Genitourinary:  Negative for dysuria and hematuria.  Musculoskeletal:  Negative for arthralgias and back pain.  Skin:  Negative for color change and rash.  Neurological:  Positive for facial asymmetry and speech difficulty (?). Negative for dizziness, tremors, seizures, syncope, weakness, light-headedness, numbness and headaches.  All other systems reviewed and are negative.  Physical Exam Updated Vital Signs  ED Triage Vitals  Enc Vitals Group     BP 10/17/20 1911 (!) 169/99     Pulse Rate 10/17/20 1911 71     Resp 10/17/20 1911 18     Temp 10/17/20 1911 98.1 F (36.7 C)     Temp src --      SpO2 10/17/20 1911 98 %     Weight --      Height --      Head Circumference --      Peak Flow --      Pain Score 10/17/20 1942 0     Pain Loc --      Pain Edu? --      Excl. in GC? --     Physical Exam Vitals and nursing note reviewed.  Constitutional:      General: He is not in acute distress.    Appearance: He is well-developed. He is not ill-appearing.  HENT:     Head: Normocephalic and atraumatic.     Nose: Nose normal.     Mouth/Throat:     Mouth: Mucous membranes are moist.  Eyes:     Extraocular Movements: Extraocular movements intact.     Conjunctiva/sclera: Conjunctivae normal.     Pupils: Pupils  are equal, round, and reactive to light.  Cardiovascular:     Rate and Rhythm: Normal rate and regular rhythm.     Pulses: Normal pulses.     Heart sounds: Normal heart sounds. No murmur heard. Pulmonary:     Effort: Pulmonary effort is normal. No respiratory distress.     Breath sounds: Normal breath sounds.  Abdominal:     Palpations: Abdomen is soft.     Tenderness: There is no abdominal tenderness.  Musculoskeletal:        General: Normal range of motion.     Cervical  back: Normal range of motion and neck supple.     Right lower leg: Edema present.     Left lower leg: Edema present.     Comments: Does have some mild edema in both legs with slightly more swelling on the right compared to the left  Skin:    General: Skin is warm and dry.  Neurological:     General: No focal deficit present.     Mental Status: He is alert.     Cranial Nerves: No cranial nerve deficit.     Sensory: No sensory deficit.     Motor: No weakness.     Coordination: Coordination normal.     Comments: 5+ out of 5 strength throughout, normal sensation, normal finger-to-nose finger, no obvious drift, no obvious facial droop, visual fields appear to be intact, speech appears to be normal, patient answers most questions appropriately    ED Results / Procedures / Treatments   Labs (all labs ordered are listed, but only abnormal results are displayed) Labs Reviewed  PROTIME-INR - Abnormal; Notable for the following components:      Result Value   Prothrombin Time 15.3 (*)    All other components within normal limits  CBC - Abnormal; Notable for the following components:   RDW 15.9 (*)    All other components within normal limits  COMPREHENSIVE METABOLIC PANEL - Abnormal; Notable for the following components:   Glucose, Bld 119 (*)    BUN 28 (*)    Calcium 8.8 (*)    All other components within normal limits  I-STAT CHEM 8, ED - Abnormal; Notable for the following components:   BUN 28 (*)    Glucose,  Bld 113 (*)    Calcium, Ion 1.13 (*)    All other components within normal limits  CBG MONITORING, ED - Abnormal; Notable for the following components:   Glucose-Capillary 104 (*)    All other components within normal limits  RESP PANEL BY RT-PCR (FLU A&B, COVID) ARPGX2  ETHANOL  APTT  DIFFERENTIAL  RAPID URINE DRUG SCREEN, HOSP PERFORMED  URINALYSIS, ROUTINE W REFLEX MICROSCOPIC    EKG EKG Interpretation  Date/Time:  Wednesday October 17 2020 19:40:53 EDT Ventricular Rate:  99 PR Interval:    QRS Duration: 95 QT Interval:  393 QTC Calculation: 495 R Axis:   54 Text Interpretation: Atrial fibrillation Ventricular premature complex Borderline prolonged QT interval Confirmed by Virgina Norfolk ([id number]) on 10/17/2020 7:44:43 PM  Radiology MR BRAIN WO CONTRAST  Result Date: 10/17/2020 CLINICAL DATA:  Left facial droop and blurry vision EXAM: MRI HEAD WITHOUT CONTRAST TECHNIQUE: Multiplanar, multiecho pulse sequences of the brain and surrounding structures were obtained without intravenous contrast. COMPARISON:  None. FINDINGS: Brain: No acute infarct, mass effect or extra-axial collection. No acute or chronic hemorrhage. There is multifocal hyperintense T2-weighted signal within the white matter. Diffuse, severe atrophy. The midline structures are normal. Vascular: Major flow voids are preserved. Skull and upper cervical spine: Normal calvarium and skull base. Visualized upper cervical spine and soft tissues are normal. Sinuses/Orbits:No paranasal sinus fluid levels or advanced mucosal thickening. No mastoid or middle ear effusion. Normal orbits. IMPRESSION: 1. No acute intracranial abnormality. 2. Severe atrophy and findings of chronic microvascular disease. Electronically Signed   By: Deatra Robinson M.D.   On: 10/17/2020 22:45   DG Chest Portable 1 View  Result Date: 10/17/2020 CLINICAL DATA:  Altered mental status. EXAM: PORTABLE CHEST 1 VIEW COMPARISON:  None. FINDINGS: Low lung volumes.  Borderline  heart size. Aortic atherosclerosis and tortuosity. Bronchovascular crowding versus vascular congestion. Minimal streaky opacity at the left lung base. No pleural fluid or pneumothorax. No acute osseous abnormalities seen. IMPRESSION: 1. Low lung volumes with bronchovascular crowding versus vascular congestion. 2.  Aortic Atherosclerosis (ICD10-I70.0). 3. Minimal streaky opacity at the left lung base, favor atelectasis. Electronically Signed   By: Narda Rutherford M.D.   On: 10/17/2020 20:01   CT HEAD CODE STROKE WO CONTRAST  Result Date: 10/17/2020 CLINICAL DATA:  Code stroke. Neuro deficit, acute, stroke suspected. Right facial droop. EXAM: CT HEAD WITHOUT CONTRAST TECHNIQUE: Contiguous axial images were obtained from the base of the skull through the vertex without intravenous contrast. COMPARISON:  None. FINDINGS: Brain: Generalized brain atrophy. Chronic small-vessel ischemic changes affecting the pons and cerebral hemispheric white matter. No sign of acute infarction, mass lesion, hemorrhage, hydrocephalus or extra-axial collection. Ventriculomegaly is in proportion to the degree atrophy. Vascular: There is atherosclerotic calcification of the major vessels at the base of the brain. Skull: Negative Sinuses/Orbits: Clear/normal Other: None ASPECTS (Alberta Stroke Program Early CT Score) - Ganglionic level infarction (caudate, lentiform nuclei, internal capsule, insula, M1-M3 cortex): 7 - Supraganglionic infarction (M4-M6 cortex): 3 Total score (0-10 with 10 being normal): 10 IMPRESSION: 1. No acute CT finding. Generalized atrophy with chronic small-vessel ischemic changes of the pons and cerebral hemispheric white matter. 2. ASPECTS is 10 3. These results were communicated to Dr. Derry Lory at 7:35 pm on 10/17/2020 by text page via the [facility] messaging system. Electronically Signed   By: Paulina Fusi M.D.   On: 10/17/2020 19:36    Procedures Procedures   Medications Ordered in ED Medications -  No data to display  ED Course  I have reviewed the triage vital signs and the nursing notes.  Pertinent labs & imaging results that were available during my care of the patient were reviewed by me and considered in my medical decision making (see chart for details).    MDM Rules/Calculators/A&P                           Ryan [name] is an 86 year old male with history of dementia, atrial fibrillation on Eliquis, high cholesterol, hypertension who presents to the ED with strokelike symptoms.  Overall unremarkable vitals.  Last normal was at 3 PM about 4 hours ago.  Woke up at 5 PM maybe with some blurry vision, right-sided facial droop, abnormal head movements.  On my evaluation he overall appears neurologically intact.  Following commands well.  No visual field deficit.  No obvious facial droop.  No drift.  He is able to mostly participate in neurological exam but overall appears nonfocal.  He is not a candidate for tPA given that he is on Eliquis and there is no evidence of large vessel occlusion.  Low concern that this is a stroke and neurology in agreement but will get MRI.  Lab work that was initiated shows no significant anemia, electrolyte ab malady, kidney injury.  EKG shows rate controlled atrial fibrillation.  Daughter also states that there has been some swelling in his legs.  Slightly worse on the right than the left.  He is on Eliquis already.  Neurovascularly appears to be intact.  There is no respiratory distress.  We will get a chest x-ray.  Suspect likely venous stasis.  Assuming he is to be discharged we will arrange for DVT study tomorrow however.  Ultrasound is not here currently at  this time.  MRI negative for stroke.  Lab work otherwise unremarkable.  No significant anemia, electrolyte ab Mody, kidney injury.  Overall suspect dementia related confusion.  He is back at his baseline.  DVT study arranged for tomorrow.  Discharged in good condition.  This chart was dictated using  voice recognition software.  Despite best efforts to proofread,  errors can occur which can change the documentation meaning.   Final Clinical Impression(s) / ED Diagnoses Final diagnoses:  Altered mental status, unspecified altered mental status type    Rx / DC Orders ED Discharge Orders          Ordered    LE VENOUS        10/17/20 2254             Virgina Norfolk, DO 10/17/20 2256

## 2020-10-17 NOTE — Discharge Instructions (Addendum)
Go to ER for further evaluation and management. °

## 2020-10-17 NOTE — ED Notes (Signed)
Patient transported to MRI 

## 2020-10-17 NOTE — ED Triage Notes (Signed)
Pt from Urgent care, daughter says he laid down for a nap at 3pm, woke up at 5pm, right sided droop, blurry vision and "strange movements of his head."  On elequis.  Code Stroke called in Triage, To CT arrived at 7:20 to CT

## 2020-10-17 NOTE — ED Provider Notes (Signed)
[facility]    CSN: [id number] Arrival date & time: 10/17/20  6812      History   Chief Complaint Chief Complaint  Patient presents with   mouth droop    Shaking    Slurred speech   Joint Swelling    HPI Ryan [name] is a 86 y.o. male.   I was called into triage to evaluate patient due to concerns for acute neurological event.  Patient has a history of dementia and was accompanied by his daughter who provide the majority of history.  Reports that earlier today he was acting his normal self when he went to bed and took a nap this afternoon.  When he woke up from his nap he had an episode with slurred speech and facial asymmetry.  This resolved on its own within a few minutes and daughter reports that he is now at his baseline functioning.  Last known normal was approximately 1 PM today.  He does have a history of CVA as well as A. fib and is currently anticoagulated on Eliquis.  Reports compliance with this medication.  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   Past Medical History:  Diagnosis Date   Alzheimer disease (HCC)    Benign prostatic hyperplasia 01/30/2020   Chronic anticoagulation    On eliquis   Gait disorder    Hypercholesterolemia    Hypertension    Mixed hyperlipidemia 01/30/2020   Paroxysmal atrial fibrillation [facility])     Patient Active Problem List   Diagnosis Date Noted   PAF (paroxysmal atrial fibrillation) (HCC) 02/15/2020   Alzheimer disease (HCC) 02/15/2020   GI bleed 01/31/2020   Lower GI bleed    Primary hypertension    Acute GI bleeding 01/30/2020   Mixed hyperlipidemia 01/30/2020   Benign prostatic hyperplasia 01/30/2020   Anemia due to acute blood loss 01/30/2020   AF (paroxysmal atrial fibrillation) (HCC) 01/30/2020    Past Surgical History:  Procedure Laterality Date   BIOPSY  02/05/2020   Procedure: BIOPSY;  Surgeon: Lemar Lofty.,  MD;  Location: [facility] ENDOSCOPY;  Service: Gastroenterology;;   CHOLECYSTECTOMY     ESOPHAGOGASTRODUODENOSCOPY (EGD) WITH PROPOFOL N/A 02/05/2020   Procedure: ESOPHAGOGASTRODUODENOSCOPY (EGD) WITH PROPOFOL;  Surgeon: Lemar Lofty., MD;  Location: [facility] ENDOSCOPY;  Service: Gastroenterology;  Laterality: N/A;   FLEXIBLE SIGMOIDOSCOPY N/A 02/05/2020   Procedure: FLEXIBLE SIGMOIDOSCOPY;  Surgeon: Meridee Score Netty Starring., MD;  Location: [facility] ENDOSCOPY;  Service: Gastroenterology;  Laterality: N/A;   SAVORY DILATION N/A 02/05/2020   Procedure: SAVORY DILATION;  Surgeon: Meridee Score Netty Starring., MD;  Location: [facility] ENDOSCOPY;  Service: Gastroenterology;  Laterality: N/A;   TOTAL HIP ARTHROPLASTY         Home Medications    Prior to Admission medications   Medication Sig Start Date End Date Taking? Authorizing Provider  atorvastatin (LIPITOR) 40 MG tablet Take 40 mg by mouth daily.    [provider]  cephALEXin (KEFLEX) 500 MG capsule TAKE 1 CAPSULE BY MOUTH 3 TIMES DAILY 05/08/20 05/08/21  Stoneking, Ann Maki, MD  ferrous sulfate 325 (65 FE) MG EC tablet Take 1 tablet (325 mg total) by mouth 2 (two) times daily. 02/06/20 02/05/21  Dorcas Carrow, MD  melatonin 3 MG TABS tablet Take 2 tablets (6 mg total) by mouth at bedtime. 02/05/20   Swayze, Ava, DO  metoprolol tartrate (LOPRESSOR) 25 MG tablet Take 0.5 tablets (12.5 mg  total) by mouth 2 (two) times daily. 02/15/20 05/15/20  Christell Constant, MD  Multiple Vitamin (MULTIVITAMIN) tablet Take 1 tablet by mouth daily.    [provider]  pantoprazole (PROTONIX) 40 MG tablet TAKE 1 TABLET(40 MG) BY MOUTH TWICE DAILY 08/21/20   Mansouraty, Netty Starring., MD  polyethylene glycol (MIRALAX / GLYCOLAX) 17 g packet in the morning and at bedtime. 02/14/20   [provider]  Probiotic Product (PROBIOTIC ADVANCED PO) Take 500 mg by mouth daily in the afternoon.    [provider]  tamsulosin (FLOMAX) 0.4 MG CAPS capsule Take 0.4 mg  by mouth daily after supper.     [provider]    Family History History reviewed. No pertinent family history.  Social History Social History   Tobacco Use   Smoking status: Never   Smokeless tobacco: Never   Tobacco comments:    maybe in 20's  Substance Use Topics   Alcohol use: Not Currently   Drug use: Never     Allergies   Patient has no known allergies.   Review of Systems Review of Systems  Unable to perform ROS: Dementia  Constitutional:  Positive for activity change.  Respiratory:  Negative for cough and shortness of breath.   Cardiovascular:  Positive for leg swelling. Negative for chest pain and palpitations.  Neurological:  Positive for facial asymmetry and speech difficulty. Negative for weakness.   ROS per daughter  Physical Exam Triage Vital Signs ED Triage Vitals  Enc Vitals Group     BP 10/17/20 1846 (!) 160/117     Pulse Rate 10/17/20 1846 92     Resp --      Temp --      Temp src --      SpO2 10/17/20 1846 96 %     Weight --      Height --      Head Circumference --      Peak Flow --      Pain Score 10/17/20 1853 0     Pain Loc --      Pain Edu? --      Excl. in GC? --    No data found.  Updated Vital Signs BP (!) 160/117   Pulse 92   SpO2 96%   Visual Acuity Right Eye Distance:   Left Eye Distance:   Bilateral Distance:    Right Eye Near:   Left Eye Near:    Bilateral Near:     Physical Exam Vitals reviewed.  Constitutional:      General: He is awake.     Appearance: Normal appearance. He is normal weight. He is not ill-appearing.     Comments: Very pleasant male appears stated age sitting comfortably in wheelchair in triage in no acute distress  HENT:     Head: Normocephalic and atraumatic.     Mouth/Throat:     Tongue: Tongue does not deviate from midline.     Pharynx: Uvula midline. No oropharyngeal exudate or posterior oropharyngeal erythema.  Eyes:     Extraocular Movements: Extraocular movements  intact.     Conjunctiva/sclera: Conjunctivae normal.     Pupils: Pupils are equal, round, and reactive to light.  Cardiovascular:     Rate and Rhythm: Normal rate and regular rhythm.     Heart sounds: Normal heart sounds, S1 normal and S2 normal. No murmur heard. Pulmonary:     Effort: Pulmonary effort is normal.     Breath sounds: Normal  breath sounds. No stridor. No wheezing, rhonchi or rales.     Comments: Seen clear to auscultation bilaterally Musculoskeletal:     Comments: Strength 4/5 at bilateral upper and lower extremities.  Normal grip strength bilaterally.  Neurological:     Mental Status: He is alert. Mental status is at baseline.     Motor: No weakness.     Coordination: Romberg sign negative.     Comments: Patient oriented to person which is his baseline.  No focal neurological defect on exam.  Psychiatric:        Behavior: Behavior is cooperative.     UC Treatments / Results  Labs (all labs ordered are listed, but only abnormal results are displayed) Labs Reviewed - No data to display  EKG   Radiology No results found.  Procedures Procedures (including critical care time)  Medications Ordered in UC Medications - No data to display  Initial Impression / Assessment and Plan / UC Course  I have reviewed the triage vital signs and the nursing notes.  Pertinent labs & imaging results that were available during my care of the patient were reviewed by me and considered in my medical decision making (see chart for details).     Last known normal was approximately 6 hours ago.  No focal neurological defect on exam so code stroke was not called but discussed that he would need to be evaluated in the emergency room given acute neurological symptoms.  Daughter is agreeable to this and will take him directly to [facility] emergency room following visit with our clinic.  Vital signs are stable at the time of discharge and he is safe for private transport.  All questions  answered to patient and daughter satisfaction.   Final Clinical Impressions(s) / UC Diagnoses   Final diagnoses:  Facial asymmetry  Slurred speech  Leg swelling  History of CVA (cerebrovascular accident)     Discharge Instructions      Go to ER for further evaluation and management.     ED Prescriptions   None    PDMP not reviewed this encounter.   Jeani Hawking, PA-C 10/17/20 1901

## 2020-10-17 NOTE — ED Notes (Signed)
Patient is being discharged from the Urgent Care and sent to the Emergency Department via POC . Per Dorann Ou PA, patient is in need of higher level of care due to stoke like sx and swelling of the ankles. Patient is aware and verbalizes understanding of plan of care.  Vitals:   10/17/20 1846  BP: (!) 160/117  Pulse: 92  SpO2: 96%

## 2020-10-17 NOTE — Consult Note (Addendum)
NEUROLOGY CONSULTATION NOTE   Date of service: October 17, 2020 Patient Name: Ryan [name] MRN:  [id number] DOB:  07-14-1934 Reason for consult: "Stroke code" Requesting Provider: Virgina Norfolk, DO _ _ _   _ __   _ __ _ _  __ __   _ __   __ _  History of Present Illness  Ryan [name] is a 86 y.o. male with PMH significant for BPH, Pafibb on eliquis, advanced Alzheimers dementia, GI bleed in nov 2021, HTN, HLD, advanced dementia who presents with some left facial droop, blurry vision and side to side head movements.  Daughter reports that he was up late last night so took a nap at 1500 in a recliner. She laid in the second recliner next to him around 1600. A little after 1700, she woke and saw him looking at her and his left face appeared twisted. She got up and the twisting had improved. She helped him get to the bathroom and he was just on the toilet seat, was not feeling right and holding his head in his hands. He was moving his head from side to side. She got him up to the sink and still continued to have these strange head movements. He has never had these before so she brought him to the ED.  A stroke code was initiated in triage for concern for acute neurological event with a left facial droop and slurred speech.  mRS: 5 tPA: not offered, low suspicion for stroke Trombectomy: Not offered, low suspicion for stroke NIHSS components Score: Comment  1a Level of Conscious 0[x]  1[]  2[]  3[]      1b LOC Questions 0[]  1[]  2[x]     Likely due to dementia.  1c LOC Commands 0[x]  1[]  2[]       2 Best Gaze 0[x]  1[]  2[]       3 Visual 0[x]  1[]  2[]  3[]      4 Facial Palsy 0[x]  1[]  2[]  3[]      5a Motor Arm - left 0[x]  1[]  2[]  3[]  4[]  UN[]    5b Motor Arm - Right 0[x]  1[]  2[]  3[]  4[]  UN[]    6a Motor Leg - Left 0[x]  1[]  2[]  3[]  4[]  UN[]    6b Motor Leg - Right 0[x]  1[]  2[]  3[]  4[]  UN[]    7 Limb Ataxia 0[x]  1[]  2[]  3[]  UN[]     8 Sensory 0[x]  1[]  2[]  UN[]      9 Best Language 0[]  1[x]  2[]  3[]    Some  difficulty with identifying some objects which I think is likely 2/2 dementia  10 Dysarthria 0[x]  1[]  2[]  UN[]      11 Extinct. and Inattention 0[x]  1[]  2[]       TOTAL:        ROS   Constitutional Denies weight loss, fever and chills.   HEENT Denies changes in vision and hearing.   Respiratory Denies SOB and cough.   CV Denies palpitations and CP   GI Denies abdominal pain, nausea, vomiting and diarrhea.   GU Denies dysuria and urinary frequency.   MSK Denies myalgia and joint pain.   Skin Denies rash and pruritus.   Neurological Denies headache and syncope.   Psychiatric Denies recent changes in mood. Denies anxiety and depression.    Past History   Past Medical History:  Diagnosis Date   Alzheimer disease (HCC)    Benign prostatic hyperplasia 01/30/2020   Chronic anticoagulation    On eliquis   Gait disorder    Hypercholesterolemia    Hypertension    Mixed  hyperlipidemia 01/30/2020   Paroxysmal atrial fibrillation [facility])    Past Surgical History:  Procedure Laterality Date   BIOPSY  02/05/2020   Procedure: BIOPSY;  Surgeon: Meridee Score Netty Starring., MD;  Location: [facility] ENDOSCOPY;  Service: Gastroenterology;;   CHOLECYSTECTOMY     ESOPHAGOGASTRODUODENOSCOPY (EGD) WITH PROPOFOL N/A 02/05/2020   Procedure: ESOPHAGOGASTRODUODENOSCOPY (EGD) WITH PROPOFOL;  Surgeon: Lemar Lofty., MD;  Location: [facility] ENDOSCOPY;  Service: Gastroenterology;  Laterality: N/A;   FLEXIBLE SIGMOIDOSCOPY N/A 02/05/2020   Procedure: FLEXIBLE SIGMOIDOSCOPY;  Surgeon: Meridee Score Netty Starring., MD;  Location: [facility] ENDOSCOPY;  Service: Gastroenterology;  Laterality: N/A;   SAVORY DILATION N/A 02/05/2020   Procedure: SAVORY DILATION;  Surgeon: Meridee Score Netty Starring., MD;  Location: [facility] ENDOSCOPY;  Service: Gastroenterology;  Laterality: N/A;   TOTAL HIP ARTHROPLASTY     No family history on file. Social History   Socioeconomic History   Marital status: Married    Spouse name: Not on file   Number of  children: 3   Years of education: Not on file   Highest education level: High school graduate  Occupational History    Comment: retired  Tobacco Use   Smoking status: Never   Smokeless tobacco: Never   Tobacco comments:    maybe in 20's  Substance and Sexual Activity   Alcohol use: Not Currently   Drug use: Never   Sexual activity: Not on file  Other Topics Concern   Not on file  Social History Narrative   01/17/20 lives with wife in home   Social Determinants of Health   Financial Resource Strain: Not on file  Food Insecurity: Not on file  Transportation Needs: Not on file  Physical Activity: Not on file  Stress: Not on file  Social Connections: Not on file   No Known Allergies  Medications  (Not in a hospital admission)    Vitals   Vitals:   10/17/20 1911 10/17/20 1942  BP: (!) 169/99 (!) 172/112  Pulse: 71 84  Resp: 18 14  Temp: 98.1 F (36.7 C)   SpO2: 98% 96%     There is no height or weight on file to calculate BMI.  Physical Exam   General: Laying comfortably in bed; in no acute distress.  HENT: Normal oropharynx and mucosa. Normal external appearance of ears and nose.  Neck: Supple, no pain or tenderness  CV: No JVD. No peripheral edema.  Pulmonary: Symmetric Chest rise. Normal respiratory effort.  Abdomen: Soft to touch, non-tender.  Ext: No cyanosis, edema, or deformity  Skin: No rash. Normal palpation of skin.   Musculoskeletal: Normal digits and nails by inspection. No clubbing.   Neurologic Examination  Mental status/Cognition: Alert, oriented to self only. Speech/language: Fluent, comprehension intact, object naming intact, repetition intact.  Cranial nerves:   CN II Pupils equal and reactive to light, no VF deficits   CN III,IV,VI EOM intact, no gaze preference or deviation, no nystagmus   CN V normal sensation in V1, V2, and V3 segments bilaterally    CN VII no asymmetry, no nasolabial fold flattening    CN VIII normal hearing to  speech    CN IX & X normal palatal elevation, no uvular deviation    CN XI 5/5 head turn and 5/5 shoulder shrug bilaterally    CN XII midline tongue protrusion    Motor:  Muscle bulk: normal, tone normal, pronator drift none Mvmt Root Nerve  Muscle Right Left Comments  SA C5/6 Ax Deltoid 5 5   EF  C5/6 Mc Biceps 5 5   EE C6/7/8 Rad Triceps 5 5   WF C6/7 Med FCR     WE C7/8 PIN ECU     F Ab C8/T1 U ADM/FDI 5 5   HF L1/2/3 Fem Illopsoas 5 5   KE L2/3/4 Fem Quad 5 5   DF L4/5 D Peron Tib Ant 5 5   PF S1/2 Tibial Grc/Sol 5 5    Reflexes:  Right Left Comments  Pectoralis      Biceps (C5/6) 2 2   Brachioradialis (C5/6) 2 2    Triceps (C6/7) 2 2    Patellar (L3/4) 2 2    Achilles (S1)      Hoffman      Plantar     Jaw jerk    Sensation:  Light touch intact   Pin prick    Temperature    Vibration   Proprioception    Coordination/Complex Motor:  - Finger to Nose intact BL - Heel to shin unable to understand how to do. - Rapid alternating movement are slowed BL - Gait: Deferred.  Labs   CBC:  Recent Labs  Lab 10/17/20 1922 10/17/20 1928  WBC 6.2  --   NEUTROABS 4.0  --   HGB 14.0 14.3  HCT 43.1 42.0  MCV 88.0  --   PLT 153  --     Basic Metabolic Panel:  Lab Results  Component Value Date   NA 143 10/17/2020   K 3.9 10/17/2020   CO2 25 04/28/2020   GLUCOSE 113 (H) 10/17/2020   BUN 28 (H) 10/17/2020   CREATININE 1.20 10/17/2020   CALCIUM 9.1 04/28/2020   GFRNONAA 33 (L) 04/28/2020   Lipid Panel:  Lab Results  Component Value Date   LDLCALC 40 02/01/2020   HgbA1c: No results found for: HGBA1C Urine Drug Screen: No results found for: LABOPIA, COCAINSCRNUR, LABBENZ, AMPHETMU, THCU, LABBARB  Alcohol Level No results found for: ETH  CT Head without contrast: CTH was negative for a large hypodensity concerning for a large territory infarct or hyperdensity concerning for an ICH  MRI Brain: pending Impression   CANIO WINOKUR is a 86 y.o. male with  PMH significant for BPH, afibb on eliquis, HTN, HLD, advanced dementia who presents with some left facial droop, blurry vision and side to side head movements. He had none of these on my evaluation. I am not entirely sure what to make of these episodes. No focal deficit on my exam to suggest a stroke. He does appear to have very significant cognitive deficit on my evaluation which would be consistent with the provided hx of advanced dementia.  I think it is reasonable to get vitals, metabolic labs and an MRI Brain w/o contrast. He had an episode of GI bleed in nov 2021 so even if thie episode was a stroke or TIA, I am less inclined to change his AC or add an antiplatelet agent. Perhaps this was just acute confusional state due to awakening from sleep.  Primary Diagnosis:  Delirium/acute confusion state in the setting of dementia.  Recommendations  - MRI Brain without contrast. - Okay to go home if MRI Brain is negative for an acute stroke.  Plan discussed with Dr. Seth Bake and with patient's daughter at bedside. ______________________________________________________________________   Thank you for the opportunity to take part in the care of this patient. If you have any further questions, please contact the neurology consultation attending.  Signed,  Erick Blinks [facility] Pager  Number [phone] _ _ _   _ __   _ __ _ _  __ __   _ __   __ _

## 2020-10-18 ENCOUNTER — Other Ambulatory Visit (HOSPITAL_COMMUNITY): Payer: Self-pay | Admitting: Emergency Medicine

## 2020-10-18 ENCOUNTER — Ambulatory Visit (HOSPITAL_COMMUNITY)
Admission: RE | Admit: 2020-10-18 | Discharge: 2020-10-18 | Disposition: A | Discharge status: Home / Self Care | Payer: Medicare Other | Source: Ambulatory Visit | Attending: Emergency Medicine | Admitting: Emergency Medicine

## 2020-10-18 ENCOUNTER — Ambulatory Visit (HOSPITAL_COMMUNITY)
Admission: RE | Admit: 2020-10-18 | Discharge: 2020-10-18 | Disposition: A | Discharge status: Home / Self Care | Payer: Medicare Other | Source: Ambulatory Visit | Attending: Cardiology | Admitting: Cardiology

## 2020-10-18 DIAGNOSIS — R079 Chest pain, unspecified: Secondary | ICD-10-CM

## 2020-10-18 DIAGNOSIS — R931 Abnormal findings on diagnostic imaging of heart and coronary circulation: Secondary | ICD-10-CM | POA: Diagnosis present

## 2020-10-18 DIAGNOSIS — R609 Edema, unspecified: Secondary | ICD-10-CM | POA: Diagnosis not present

## 2020-10-18 DIAGNOSIS — M7989 Other specified soft tissue disorders: Secondary | ICD-10-CM

## 2020-11-13 ENCOUNTER — Ambulatory Visit (INDEPENDENT_AMBULATORY_CARE_PROVIDER_SITE_OTHER): Payer: Medicare Other | Admitting: Diagnostic Neuroimaging

## 2020-11-13 ENCOUNTER — Encounter: Payer: Self-pay | Admitting: Diagnostic Neuroimaging

## 2020-11-13 VITALS — BP 116/78 | HR 76 | Wt 180.0 lb

## 2020-11-13 DIAGNOSIS — F0390 Unspecified dementia without behavioral disturbance: Secondary | ICD-10-CM

## 2020-11-13 DIAGNOSIS — F03C0 Unspecified dementia, severe, without behavioral disturbance, psychotic disturbance, mood disturbance, and anxiety: Secondary | ICD-10-CM

## 2020-11-13 NOTE — Progress Notes (Signed)
[facility]  PATIENT: Ryan MARINELLO DOB: [birth year]  REFERRING CLINICIAN: [name], MD HISTORY FROM: patient  REASON FOR VISIT: follow up   HISTORICAL  CHIEF COMPLAINT:  Chief Complaint  Patient presents with   Dementia    Rm 1 FU req for worsening, dgtrBritta Mccreedy    HISTORY OF PRESENT ILLNESS:   UPDATE (11/13/20, VRP): Since last visit, now in independent living at [facility] since Feb 2022, with support of daughters. Still with severe symptoms. Needs help with bathing, dressing, hygiene, feeding, mobility. Cannot function outside of home independently.  PRIOR HPI (01/17/20): 86 year old male here for evaluation of dementia.  Patient is accompanied by his daughters.  Patient lives in [place] but visits [place] to spend time in the winters with daughters.  He was diagnosed with dementia in 2015.  He got married in 2017.  In 2018 he was assigned a guardian by the state.  Patient has very limited insight.  He is calm and pleasant during our evaluation.  ADLs significantly limited.  Needs help with bathing, dressing, toileting, transferring continence.  He is able to feed himself.  Not able to function independently outside at home.  Cannot manage finances.  Not able to drive.  Cannot manage medications.    REVIEW OF SYSTEMS: Full 14 system review of systems performed and negative with exception of: As per HPI.  ALLERGIES: No Known Allergies  HOME MEDICATIONS: Outpatient Medications Prior to Visit  Medication Sig Dispense Refill   atorvastatin (LIPITOR) 40 MG tablet Take 40 mg by mouth daily.     ELIQUIS 5 MG TABS tablet Take 5 mg by mouth 2 (two) times daily.     Multiple Vitamin (MULTIVITAMIN) tablet Take 1 tablet by mouth daily.     pantoprazole (PROTONIX) 40 MG tablet TAKE 1 TABLET(40 MG) BY MOUTH TWICE DAILY 180 tablet 2   polyethylene glycol (MIRALAX / GLYCOLAX) 17 g packet in the morning and at bedtime.     Probiotic Product (PROBIOTIC  ADVANCED PO) Take 500 mg by mouth daily in the afternoon.     tamsulosin (FLOMAX) 0.4 MG CAPS capsule Take 0.4 mg by mouth daily after supper.      metoprolol tartrate (LOPRESSOR) 25 MG tablet Take 0.5 tablets (12.5 mg total) by mouth 2 (two) times daily. 90 tablet 3   cephALEXin (KEFLEX) 500 MG capsule TAKE 1 CAPSULE BY MOUTH 3 TIMES DAILY 30 capsule 0   ferrous sulfate 325 (65 FE) MG EC tablet Take 1 tablet (325 mg total) by mouth 2 (two) times daily. 60 tablet 11   melatonin 3 MG TABS tablet Take 2 tablets (6 mg total) by mouth at bedtime. 60 tablet 0   No facility-administered medications prior to visit.    PAST MEDICAL HISTORY: Past Medical History:  Diagnosis Date   Alzheimer disease (HCC)    Benign prostatic hyperplasia 01/30/2020   Chronic anticoagulation    On eliquis   Gait disorder    Hypercholesterolemia    Hypertension    Mixed hyperlipidemia 01/30/2020   Paroxysmal atrial fibrillation (HCC)     PAST SURGICAL HISTORY: Past Surgical History:  Procedure Laterality Date   BIOPSY  02/05/2020   Procedure: BIOPSY;  Surgeon: Lemar Lofty., MD;  Location: [facility] ENDOSCOPY;  Service: Gastroenterology;;   CHOLECYSTECTOMY     ESOPHAGOGASTRODUODENOSCOPY (EGD) WITH PROPOFOL N/A 02/05/2020   Procedure: ESOPHAGOGASTRODUODENOSCOPY (EGD) WITH PROPOFOL;  Surgeon: Lemar Lofty., MD;  Location: [facility] ENDOSCOPY;  Service: Gastroenterology;  Laterality: N/A;  FLEXIBLE SIGMOIDOSCOPY N/A 02/05/2020   Procedure: FLEXIBLE SIGMOIDOSCOPY;  Surgeon: Meridee Score Netty Starring., MD;  Location: [facility] ENDOSCOPY;  Service: Gastroenterology;  Laterality: N/A;   SAVORY DILATION N/A 02/05/2020   Procedure: SAVORY DILATION;  Surgeon: Meridee Score Netty Starring., MD;  Location: [facility] ENDOSCOPY;  Service: Gastroenterology;  Laterality: N/A;   TOTAL HIP ARTHROPLASTY      FAMILY HISTORY: No family history on file.  SOCIAL HISTORY: Social History   Socioeconomic History   Marital status: Married     Spouse name: Not on file   Number of children: 3   Years of education: Not on file   Highest education level: High school graduate  Occupational History    Comment: retired  Tobacco Use   Smoking status: Never   Smokeless tobacco: Never   Tobacco comments:    maybe in 20's  Substance and Sexual Activity   Alcohol use: Not Currently   Drug use: Never   Sexual activity: Not on file  Other Topics Concern   Not on file  Social History Narrative   01/17/20 lives with wife in home   Social Determinants of Health   Financial Resource Strain: Not on file  Food Insecurity: Not on file  Transportation Needs: Not on file  Physical Activity: Not on file  Stress: Not on file  Social Connections: Not on file  Intimate Partner Violence: Not on file     PHYSICAL EXAM  GENERAL EXAM/CONSTITUTIONAL: Vitals:  Vitals:   11/13/20 1041  BP: 116/78  Pulse: 76  Weight: 180 lb (81.6 kg)   Body mass index is 25.83 kg/m. Wt Readings from Last 3 Encounters:  11/13/20 180 lb (81.6 kg)  02/15/20 176 lb (79.8 kg)  01/30/20 175 lb 0.7 oz (79.4 kg)   Patient is in no distress; well developed, nourished and groomed; neck is supple  CARDIOVASCULAR: Examination of carotid arteries is normal; no carotid bruits Regular rate and rhythm, no murmurs Examination of peripheral vascular system by observation and palpation is normal  EYES: Ophthalmoscopic exam of optic discs and posterior segments is normal; no papilledema or hemorrhages No results found.  MUSCULOSKELETAL: Gait, strength, tone, movements noted in Neurologic exam below  NEUROLOGIC: MENTAL STATUS:  MMSE - Mini Mental State Exam 11/13/2020 01/17/2020  Orientation to time 0 0  Orientation to Place 0 0  Registration 3 1  Attention/ Calculation 0 0  Recall 0 0  Language- name 2 objects 2 2  Language- repeat 0 1  Language- follow 3 step command 3 3  Language- read & follow direction 0 1  Write a sentence 0 0  Copy design 0 0   Total score 8 8   awake, alert, oriented to person DECR memory  DECR attention and concentration DECR FLUENCY, COMPREHENSION, INSIGHT fund of knowledge LIMITED   CRANIAL NERVE:  2nd - no papilledema on fundoscopic exam 2nd, 3rd, 4th, 6th - pupils equal and reactive to light, visual fields full to confrontation, extraocular muscles intact, no nystagmus 5th - facial sensation symmetric 7th - facial strength symmetric 8th - hearing intact 9th - palate elevates symmetrically, uvula midline 11th - shoulder shrug symmetric 12th - tongue protrusion midline  MOTOR:  Mild motor apraxia normal bulk and tone, full strength in the BUE, BLE  SENSORY:  normal and symmetric to light touch, temperature, vibration  COORDINATION:  finger-nose-finger, fine finger movements normal  REFLEXES:  deep tendon reflexes TRACE and symmetric  GAIT/STATION:  IN WHEELCHAIR     DIAGNOSTIC DATA (LABS, IMAGING,  TESTING) - I reviewed patient records, labs, notes, testing and imaging myself where available.  Lab Results  Component Value Date   WBC 6.2 10/17/2020   HGB 14.3 10/17/2020   HCT 42.0 10/17/2020   MCV 88.0 10/17/2020   PLT 153 10/17/2020      Component Value Date/Time   NA 143 10/17/2020 1928   K 3.9 10/17/2020 1928   CL 105 10/17/2020 1928   CO2 26 10/17/2020 1922   GLUCOSE 113 (H) 10/17/2020 1928   BUN 28 (H) 10/17/2020 1928   CREATININE 1.20 10/17/2020 1928   CALCIUM 8.8 (L) 10/17/2020 1922   PROT 6.6 10/17/2020 1922   ALBUMIN 3.5 10/17/2020 1922   AST 28 10/17/2020 1922   ALT 28 10/17/2020 1922   ALKPHOS 66 10/17/2020 1922   BILITOT 0.9 10/17/2020 1922   GFRNONAA >60 10/17/2020 1922   Lab Results  Component Value Date   CHOL 94 02/01/2020   HDL 46 02/01/2020   LDLCALC 40 02/01/2020   TRIG 40 02/01/2020   CHOLHDL 2.0 02/01/2020   No results found for: HGBA1C No results found for: VITAMINB12 No results found for: TSH   10/17/20 MRI brain 1. No acute  intracranial abnormality. 2. Severe atrophy and findings of chronic microvascular disease.   ASSESSMENT AND PLAN  86 y.o. year old male here with:  Dx:  1. Severe dementia (HCC)      PLAN:  SEVERE DEMENTIA (since 2015; MMSE 8/30; significant decline in ADLs) - safety / supervision issues reviewed - daily physical activity / exercise (at least 15-30 minutes) - eat more plants / vegetables - increase social activities, brain stimulation, games, puzzles, hobbies, crafts, arts, music - aim for at least 7-8 hours sleep per night (or more) - avoid smoking and alcohol - caregiver resources provided - no driving; cannot handle finances or medications  Return for return to PCP.    Suanne Marker, MD 11/13/2020, 10:52 AM Certified in Neurology, Neurophysiology and Neuroimaging  [facility] [address] [place], [place] [address] [phone]

## 2020-11-13 NOTE — Patient Instructions (Signed)
SEVERE DEMENTIA (since 2015; MMSE 8/30; significant decline in ADLs) - safety / supervision issues reviewed - no driving; cannot handle finances or medications

## 2020-12-25 ENCOUNTER — Inpatient Hospital Stay (HOSPITAL_COMMUNITY)
Admission: EM | Admit: 2020-12-25 | Discharge: 2020-12-31 | DRG: 378 | Disposition: A | Discharge status: Home / Self Care | Payer: Medicare Other | Attending: Internal Medicine | Admitting: Internal Medicine

## 2020-12-25 ENCOUNTER — Encounter (HOSPITAL_COMMUNITY): Payer: Self-pay | Admitting: Emergency Medicine

## 2020-12-25 DIAGNOSIS — K922 Gastrointestinal hemorrhage, unspecified: Secondary | ICD-10-CM | POA: Diagnosis present

## 2020-12-25 DIAGNOSIS — K5909 Other constipation: Secondary | ICD-10-CM | POA: Diagnosis present

## 2020-12-25 DIAGNOSIS — Z6825 Body mass index (BMI) 25.0-25.9, adult: Secondary | ICD-10-CM

## 2020-12-25 DIAGNOSIS — N401 Enlarged prostate with lower urinary tract symptoms: Secondary | ICD-10-CM | POA: Diagnosis present

## 2020-12-25 DIAGNOSIS — K921 Melena: Secondary | ICD-10-CM

## 2020-12-25 DIAGNOSIS — Z7901 Long term (current) use of anticoagulants: Secondary | ICD-10-CM

## 2020-12-25 DIAGNOSIS — K2100 Gastro-esophageal reflux disease with esophagitis, without bleeding: Secondary | ICD-10-CM | POA: Diagnosis present

## 2020-12-25 DIAGNOSIS — D689 Coagulation defect, unspecified: Secondary | ICD-10-CM

## 2020-12-25 DIAGNOSIS — F0280 Dementia in other diseases classified elsewhere without behavioral disturbance: Secondary | ICD-10-CM | POA: Diagnosis present

## 2020-12-25 DIAGNOSIS — K5733 Diverticulitis of large intestine without perforation or abscess with bleeding: Secondary | ICD-10-CM | POA: Diagnosis not present

## 2020-12-25 DIAGNOSIS — I482 Chronic atrial fibrillation, unspecified: Secondary | ICD-10-CM | POA: Diagnosis present

## 2020-12-25 DIAGNOSIS — E669 Obesity, unspecified: Secondary | ICD-10-CM | POA: Diagnosis present

## 2020-12-25 DIAGNOSIS — I10 Essential (primary) hypertension: Secondary | ICD-10-CM | POA: Diagnosis present

## 2020-12-25 DIAGNOSIS — Z20822 Contact with and (suspected) exposure to covid-19: Secondary | ICD-10-CM | POA: Diagnosis present

## 2020-12-25 DIAGNOSIS — Z79899 Other long term (current) drug therapy: Secondary | ICD-10-CM

## 2020-12-25 DIAGNOSIS — N39490 Overflow incontinence: Secondary | ICD-10-CM | POA: Diagnosis present

## 2020-12-25 DIAGNOSIS — E782 Mixed hyperlipidemia: Secondary | ICD-10-CM | POA: Diagnosis present

## 2020-12-25 DIAGNOSIS — Z8719 Personal history of other diseases of the digestive system: Secondary | ICD-10-CM

## 2020-12-25 DIAGNOSIS — Z96642 Presence of left artificial hip joint: Secondary | ICD-10-CM | POA: Diagnosis present

## 2020-12-25 DIAGNOSIS — I119 Hypertensive heart disease without heart failure: Secondary | ICD-10-CM | POA: Diagnosis present

## 2020-12-25 DIAGNOSIS — G309 Alzheimer's disease, unspecified: Secondary | ICD-10-CM | POA: Diagnosis present

## 2020-12-25 DIAGNOSIS — R9431 Abnormal electrocardiogram [ECG] [EKG]: Secondary | ICD-10-CM

## 2020-12-25 DIAGNOSIS — I480 Paroxysmal atrial fibrillation: Secondary | ICD-10-CM | POA: Diagnosis present

## 2020-12-25 LAB — CBC
HCT: 41.4 % (ref 39.0–52.0)
Hemoglobin: 13.4 g/dL (ref 13.0–17.0)
MCH: 28.5 pg (ref 26.0–34.0)
MCHC: 32.4 g/dL (ref 30.0–36.0)
MCV: 87.9 fL (ref 80.0–100.0)
Platelets: 152 10*3/uL (ref 150–400)
RBC: 4.71 MIL/uL (ref 4.22–5.81)
RDW: 14.5 % (ref 11.5–15.5)
WBC: 7.6 10*3/uL (ref 4.0–10.5)
nRBC: 0 % (ref 0.0–0.2)

## 2020-12-25 LAB — COMPREHENSIVE METABOLIC PANEL
ALT: 20 U/L (ref 0–44)
AST: 18 U/L (ref 15–41)
Albumin: 3.4 g/dL — ABNORMAL LOW (ref 3.5–5.0)
Alkaline Phosphatase: 71 U/L (ref 38–126)
Anion gap: 8 (ref 5–15)
BUN: 35 mg/dL — ABNORMAL HIGH (ref 8–23)
CO2: 27 mmol/L (ref 22–32)
Calcium: 9 mg/dL (ref 8.9–10.3)
Chloride: 104 mmol/L (ref 98–111)
Creatinine, Ser: 1.17 mg/dL (ref 0.61–1.24)
GFR, Estimated: >60 mL/min (ref >60)
Glucose, Bld: 123 mg/dL — ABNORMAL HIGH (ref 70–99)
Potassium: 4.1 mmol/L (ref 3.5–5.1)
Sodium: 139 mmol/L (ref 135–145)
Total Bilirubin: 1 mg/dL (ref 0.3–1.2)
Total Protein: 6.5 g/dL (ref 6.5–8.1)

## 2020-12-25 LAB — TYPE AND SCREEN
ABO/RH(D): O POS
Antibody Screen: NEGATIVE

## 2020-12-25 LAB — PROTIME-INR
INR: 1.3 — ABNORMAL HIGH (ref 0.8–1.2)
Prothrombin Time: 16 seconds — ABNORMAL HIGH (ref 11.4–15.2)

## 2020-12-25 NOTE — ED Triage Notes (Signed)
Pt with daughter who reports pt has had blood in his stool since this morning. Pt is on eliquis. Hx of diverticulosis. Family with pt reports severe dementia. Family states the stool is dark.

## 2020-12-26 ENCOUNTER — Observation Stay (HOSPITAL_COMMUNITY): Payer: Medicare Other

## 2020-12-26 ENCOUNTER — Encounter (HOSPITAL_COMMUNITY): Payer: Self-pay | Admitting: Family Medicine

## 2020-12-26 DIAGNOSIS — I480 Paroxysmal atrial fibrillation: Secondary | ICD-10-CM

## 2020-12-26 DIAGNOSIS — K921 Melena: Secondary | ICD-10-CM

## 2020-12-26 DIAGNOSIS — F0280 Dementia in other diseases classified elsewhere without behavioral disturbance: Secondary | ICD-10-CM | POA: Diagnosis not present

## 2020-12-26 DIAGNOSIS — G309 Alzheimer's disease, unspecified: Secondary | ICD-10-CM | POA: Diagnosis not present

## 2020-12-26 DIAGNOSIS — K5791 Diverticulosis of intestine, part unspecified, without perforation or abscess with bleeding: Secondary | ICD-10-CM

## 2020-12-26 DIAGNOSIS — I10 Essential (primary) hypertension: Secondary | ICD-10-CM

## 2020-12-26 DIAGNOSIS — R9431 Abnormal electrocardiogram [ECG] [EKG]: Secondary | ICD-10-CM

## 2020-12-26 DIAGNOSIS — K922 Gastrointestinal hemorrhage, unspecified: Secondary | ICD-10-CM | POA: Diagnosis present

## 2020-12-26 LAB — HEMOGLOBIN AND HEMATOCRIT, BLOOD
HCT: 41.2 % (ref 39.0–52.0)
HCT: 35.9 % — ABNORMAL LOW (ref 39.0–52.0)
HCT: 35.3 % — ABNORMAL LOW (ref 39.0–52.0)
Hemoglobin: 13.2 g/dL (ref 13.0–17.0)
Hemoglobin: 11.6 g/dL — ABNORMAL LOW (ref 13.0–17.0)
Hemoglobin: 11.6 g/dL — ABNORMAL LOW (ref 13.0–17.0)

## 2020-12-26 LAB — C DIFFICILE QUICK SCREEN W PCR REFLEX
C Diff antigen: POSITIVE — AB
C Diff toxin: NEGATIVE

## 2020-12-26 LAB — RESP PANEL BY RT-PCR (FLU A&B, COVID) ARPGX2
Influenza A by PCR: NEGATIVE
Influenza B by PCR: NEGATIVE
SARS Coronavirus 2 by RT PCR: NEGATIVE

## 2020-12-26 LAB — POC OCCULT BLOOD, ED: Fecal Occult Bld: POSITIVE — AB

## 2020-12-26 LAB — CLOSTRIDIUM DIFFICILE BY PCR, REFLEXED: Toxigenic C. Difficile by PCR: NEGATIVE

## 2020-12-26 IMAGING — CT CTA GI BLEED
2 of 19 series · 9 of 46 positions shown, 15 images · IV contrast (APPLIED)
Comparison: CT abdomen and pelvis [DATE]

CLINICAL DATA: AFib on Eliquis.  Dark red blood per rectum.

EXAM:
CTA ABDOMEN AND PELVIS WITHOUT AND WITH CONTRAST
TECHNIQUE: Multidetector CT imaging of the abdomen and pelvis was performed
using the standard protocol during bolus administration of
intravenous contrast. Multiplanar reconstructed images and MIPs were
obtained and reviewed to evaluate the vascular anatomy.
CONTRAST:  100mL OMNIPAQUE IOHEXOL 350 MG/ML SOLN

[Series 17: cor · coronal · 0.76mm/px · 1 of 127 slices shown, 2 images]
[im 64/127  soft-tissue]
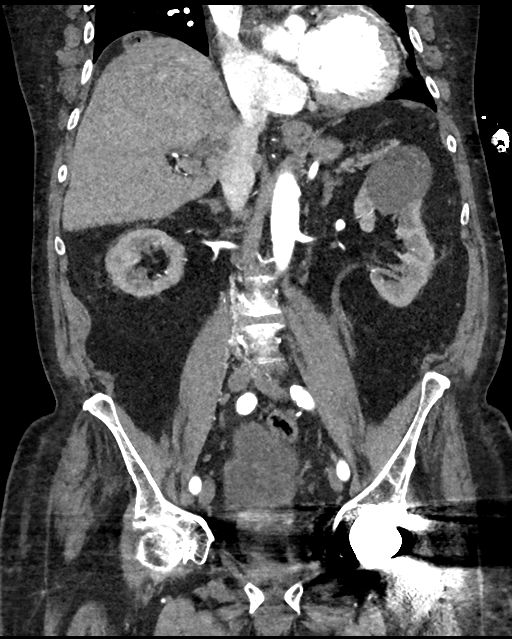
[im 64/127  bone]
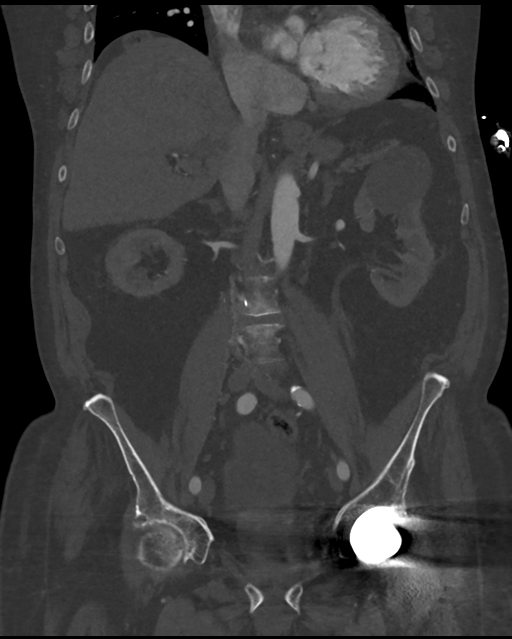

[Series 21: venous thins · axial · portal-venous · 0.78mm/px · z∈[-451,-79]mm · 8 of 1199 slices shown, 13 images]
[im 134/1199  soft-tissue]
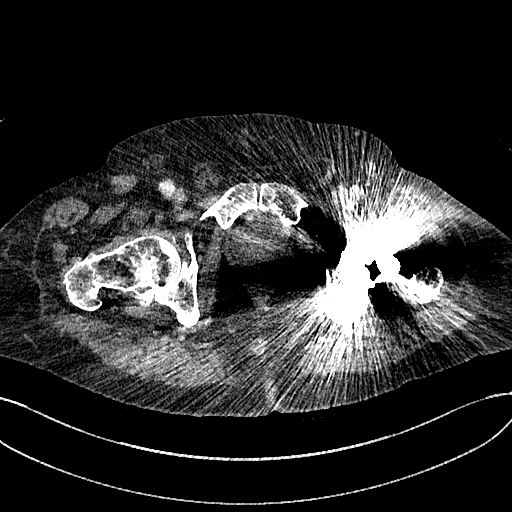
[im 134/1199  bone]
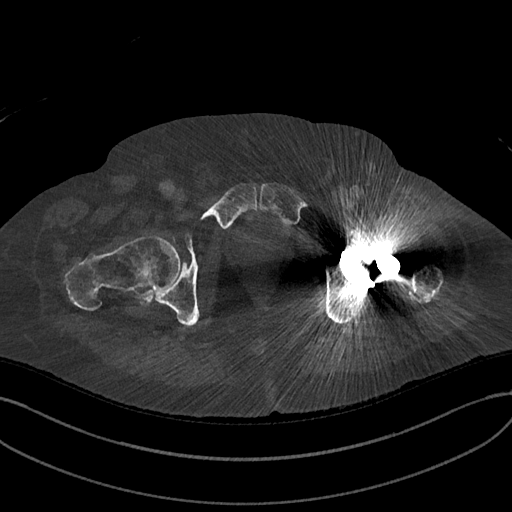
[im 267/1199  soft-tissue]
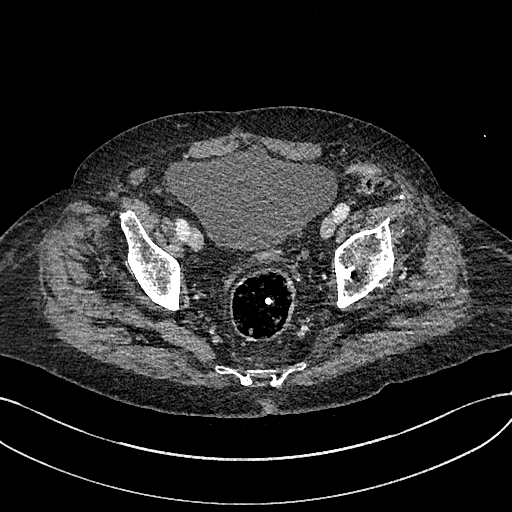
[im 400/1199  soft-tissue]
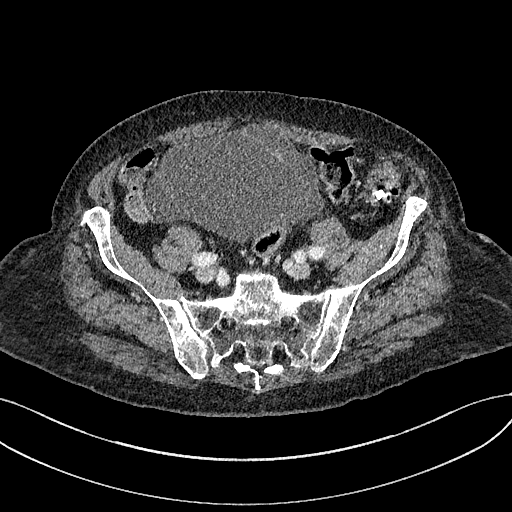
[im 533/1199  soft-tissue]
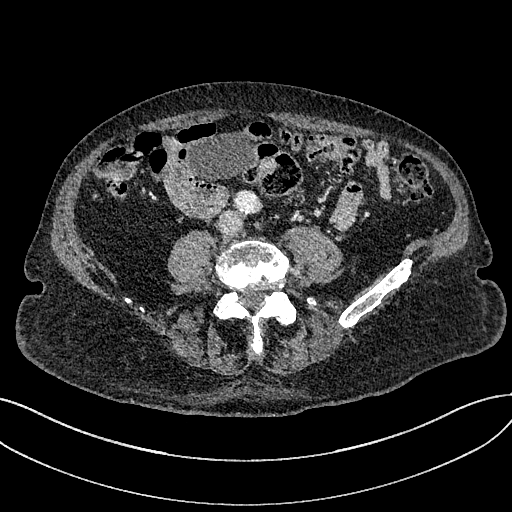
[im 666/1199  soft-tissue]
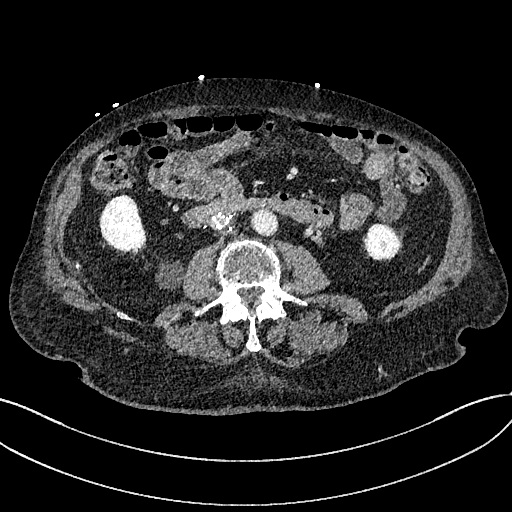
[im 666/1199  lung]
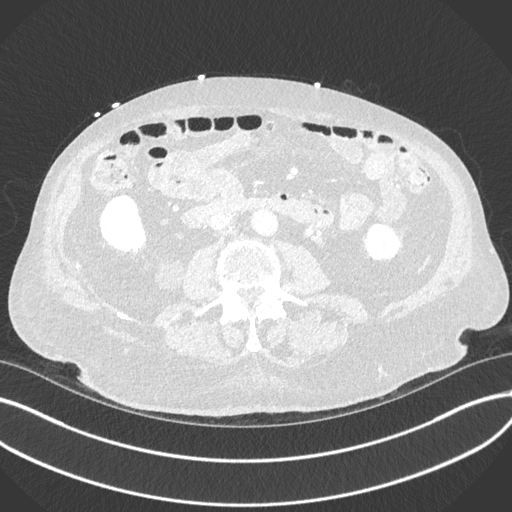
[im 799/1199  soft-tissue]
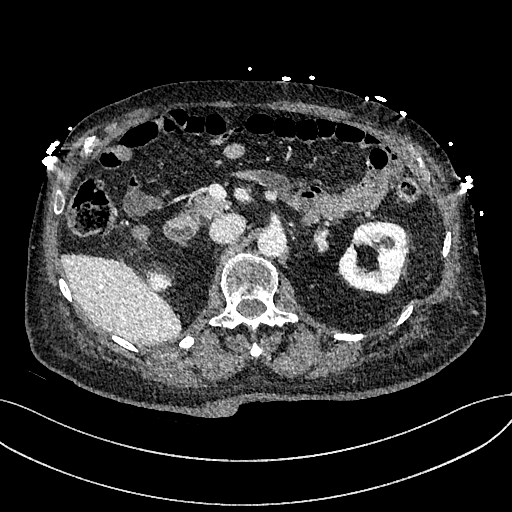
[im 799/1199  lung]
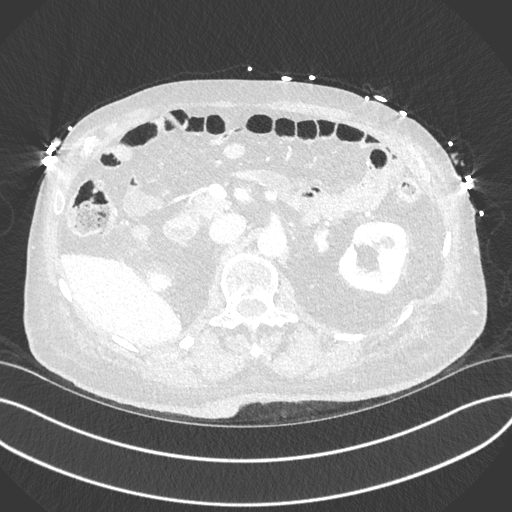
[im 932/1199  soft-tissue]
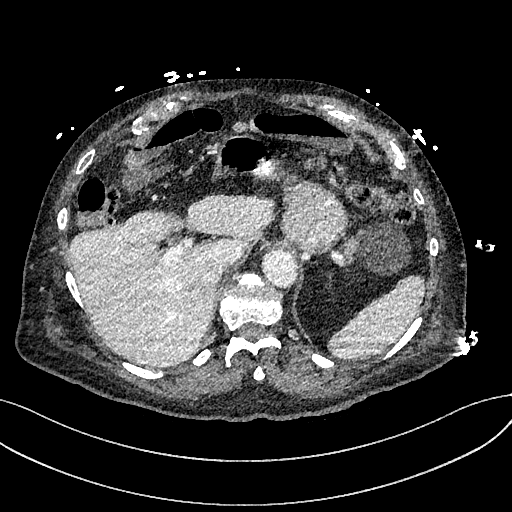
[im 932/1199  lung]
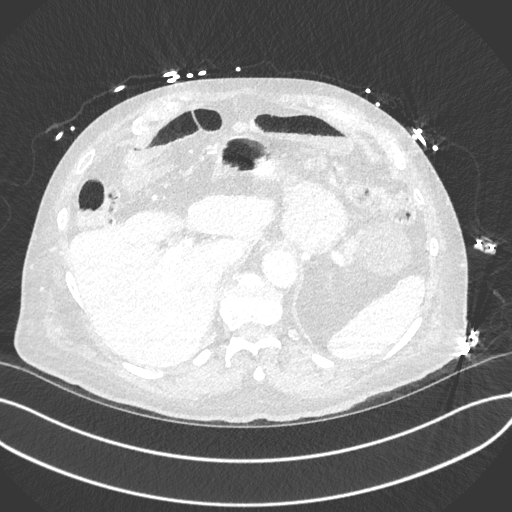
[im 1065/1199  soft-tissue]
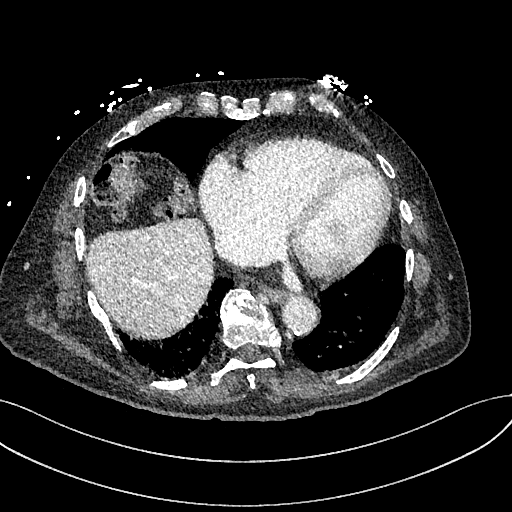
[im 1065/1199  lung]
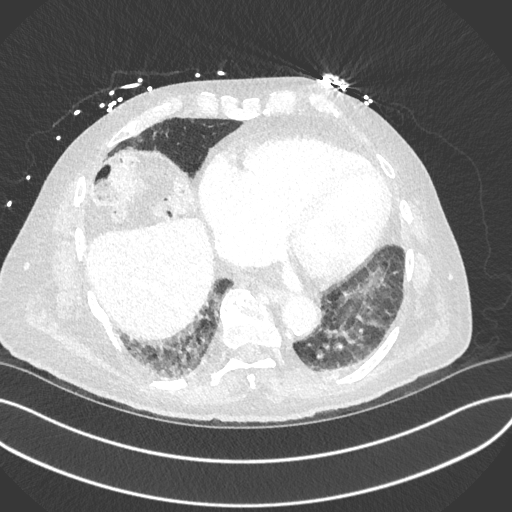

[9 of 46 positions shown; findings below may reference images not displayed]

FINDINGS: VASCULAR

Aorta: Normal caliber aorta without aneurysm, dissection, vasculitis
or significant stenosis. There is calcified atherosclerotic disease
in the aorta.

Celiac: There is aneurysmal dilatation of the celiac artery
measuring up to 1 cm similar to the prior study. Branches appear
patent. No significant stenosis.

SMA: Patent without evidence of aneurysm, dissection, vasculitis or
significant stenosis.

Renals: There is a 7 by 8 mm saccular left renal artery aneurysm
which is unchanged. Renal arteries are otherwise within normal
limits.

IMA: Patent without evidence of aneurysm, dissection, vasculitis or
significant stenosis.

Inflow: Patent without evidence of aneurysm, dissection, vasculitis
or significant stenosis.

Proximal Outflow: Bilateral common femoral and visualized portions
of the superficial and profunda femoral arteries are patent without
evidence of aneurysm, dissection, vasculitis or significant
stenosis.

Veins: Infrarenal IVC filter in place which appears unchanged in
size. On arterial phase imaging there is some vascularity seen
posterior to the IVC at the level of the filter with some
questionable arterial feeder vessel. There is some contrast
opacification in the IVC at this level. Findings are unchanged from
the prior examination. No thrombus identified.

Review of the MIP images confirms the above findings.

NON-VASCULAR

Lower chest: There is atelectasis in the lung bases. The heart is
enlarged

Hepatobiliary: There is a hypodensity in the right lobe of the liver
which is too small to characterize, likely a cyst or hemangioma.
Gallbladder surgically absent.

Pancreas: Unremarkable. No pancreatic ductal dilatation or
surrounding inflammatory changes.

Spleen: Normal in size without focal abnormality.

Adrenals/Urinary Tract: There is a 2 cm right renal cyst. There is a
4.1 cm left renal cyst. There are additional hypodensities in both
kidneys which are too small to characterize. There is no
hydronephrosis or perinephric fluid. Bladder is distended. Numerous
bladder diverticula are again seen. Bladder wall trabeculation is
again noted. Adrenal glands are within normal limits.

Stomach/Bowel: There are no areas of active extravasation of
contrast on arterial or portal venous phase imaging within the
bowel. There is no evidence for bowel obstruction or free air. There
is diffuse colonic diverticulosis. There is mild inflammatory
stranding surrounding diverticula in the distal descending colon
compatible with acute diverticulitis. There is no evidence for
perforation or abscess.

Lymphatic: Aortic atherosclerosis. No enlarged abdominal or pelvic
lymph nodes.

Reproductive: Prostate gland is mildly enlarged, unchanged.

Other: There is no free fluid. There is a small fat containing
umbilical hernia.

Musculoskeletal: There are healed right-sided rib fractures.
Multilevel degenerative changes affect the spine. There are
degenerative changes of the right hip. Left hip arthroplasty is
present.
IMPRESSION: VASCULAR

1. No evidence for active gastrointestinal bleeding.
2. IVC filter in place. There is vascularity posterior to the IVC at
the level of the filter with questionable arterial feeder vessels
and contrast opacification of the IVC on arterial phase imaging.
Findings are suspicious for AV malformation or fistula at this
level.
3. Stable 8 mm left renal artery aneurysm.
4. Stable aneurysmal dilatation of the celiac artery measuring 1 cm.

NON-VASCULAR

1. Acute uncomplicated descending colon diverticulitis.
2. Stable trabeculated bladder wall with multiple bladder
diverticula suggesting chronic bladder outlet obstruction.

## 2020-12-26 MED ORDER — SENNOSIDES-DOCUSATE SODIUM 8.6-50 MG PO TABS: 1.0000 | ORAL_TABLET | Freq: Every evening | ORAL | Status: DC | PRN

## 2020-12-26 MED ORDER — ATORVASTATIN CALCIUM 40 MG PO TABS
40.0000 mg | ORAL_TABLET | Freq: Every day | ORAL | Status: DC
  Administered 2020-12-26 – 2020-12-31 (×6): 40 mg via ORAL
  Filled 2020-12-26 (×6): qty 1

## 2020-12-26 MED ORDER — SODIUM CHLORIDE 0.9 % IV SOLN: Freq: Once | INTRAVENOUS | Status: AC

## 2020-12-26 MED ORDER — METOPROLOL TARTRATE 5 MG/5ML IV SOLN: 5.0000 mg | INTRAVENOUS | Status: DC | PRN

## 2020-12-26 MED ORDER — LACTATED RINGERS IV SOLN: INTRAVENOUS | Status: DC

## 2020-12-26 MED ORDER — ACETAMINOPHEN 325 MG PO TABS: 650.0000 mg | ORAL_TABLET | Freq: Four times a day (QID) | ORAL | Status: DC | PRN

## 2020-12-26 MED ORDER — METOPROLOL TARTRATE 12.5 MG HALF TABLET
12.5000 mg | ORAL_TABLET | Freq: Two times a day (BID) | ORAL | Status: DC
  Administered 2020-12-26 – 2020-12-31 (×10): 12.5 mg via ORAL
  Filled 2020-12-26 (×11): qty 1

## 2020-12-26 MED ORDER — PANTOPRAZOLE SODIUM 40 MG PO TBEC
40.0000 mg | DELAYED_RELEASE_TABLET | Freq: Two times a day (BID) | ORAL | Status: DC
  Administered 2020-12-26 – 2020-12-31 (×9): 40 mg via ORAL
  Filled 2020-12-26 (×10): qty 1

## 2020-12-26 MED ORDER — HYDRALAZINE HCL 20 MG/ML IJ SOLN
10.0000 mg | INTRAMUSCULAR | Status: DC | PRN
  Administered 2020-12-27: 10 mg via INTRAVENOUS
  Filled 2020-12-26: qty 1

## 2020-12-26 MED ORDER — IOHEXOL 350 MG/ML SOLN
100.0000 mL | Freq: Once | INTRAVENOUS | Status: AC | PRN
  Administered 2020-12-26: 100 mL via INTRAVENOUS

## 2020-12-26 MED ORDER — ACETAMINOPHEN 650 MG RE SUPP: 650.0000 mg | Freq: Four times a day (QID) | RECTAL | Status: DC | PRN

## 2020-12-26 MED ORDER — GUAIFENESIN 100 MG/5ML PO LIQD
5.0000 mL | ORAL | Status: DC | PRN
Start: 2020-12-26 — End: 2020-12-31
  Filled 2020-12-26: qty 5

## 2020-12-26 MED ORDER — TAMSULOSIN HCL 0.4 MG PO CAPS
0.4000 mg | ORAL_CAPSULE | Freq: Every day | ORAL | Status: DC
  Administered 2020-12-26 – 2020-12-30 (×4): 0.4 mg via ORAL
  Filled 2020-12-26 (×5): qty 1

## 2020-12-26 NOTE — Progress Notes (Addendum)
86 year old with history of Alzheimer's, A. fib on Eliquis admitted for dark red blood per rectum.  Upon admission he was noted to have stable hemoglobin but did not have any evidence of blood loss.  His Eliquis was stopped. When I saw the patient he did not have any complaints, but did have some rectal bleeding and was evaluated by GI.  Daughter was present at bedside.  I did bring up the topic of long-term anticoagulation with the daughter who would like to discuss this with patient's PCP.  He is vital signs stable.    At this time we will plan to keep him on clear liquid diet, gentle hydration.  Will start PPI.  If continues to have bleeding will order CTA.  Hold off on Eliquis.  Discussed with patient's RN.  Stephania Fragmin MD [facility]

## 2020-12-26 NOTE — ED Provider Notes (Signed)
[facility] EMERGENCY DEPARTMENT Provider Note   CSN: [id number] Arrival date & time: 12/25/20  1726     History Chief Complaint  Patient presents with   Blood In Stools    Ryan [name] is a 86 y.o. male.  Patient with history of advanced dementia, PAF on Eliquis, lower GI bleed (12/21), HLD, HTN presents with daughters who provide 24-hour in-home care. Daughter reports he woke yesterday with a full diaper of foul smelling, dark/black stool, mixed with BRB. He had another episode later in the afternoon prompting ED evaluation given history of GI bleeding. On arrival to the ED, he started having increased frequency of almost constant semi-solid, black stools. No syncope of near syncope. He does not complain of pain. No vomiting.   The history is provided by a relative.      Past Medical History:  Diagnosis Date   Alzheimer disease (HCC)    Benign prostatic hyperplasia 01/30/2020   Chronic anticoagulation    On eliquis   Gait disorder    Hypercholesterolemia    Hypertension    Mixed hyperlipidemia 01/30/2020   Paroxysmal atrial fibrillation [facility])     Patient Active Problem List   Diagnosis Date Noted   PAF (paroxysmal atrial fibrillation) (HCC) 02/15/2020   Alzheimer disease (HCC) 02/15/2020   GI bleed 01/31/2020   Lower GI bleed    Primary hypertension    Acute GI bleeding 01/30/2020   Mixed hyperlipidemia 01/30/2020   Benign prostatic hyperplasia 01/30/2020   Anemia due to acute blood loss 01/30/2020   AF (paroxysmal atrial fibrillation) (HCC) 01/30/2020    Past Surgical History:  Procedure Laterality Date   BIOPSY  02/05/2020   Procedure: BIOPSY;  Surgeon: Lemar Lofty., MD;  Location: [facility] ENDOSCOPY;  Service: Gastroenterology;;   CHOLECYSTECTOMY     ESOPHAGOGASTRODUODENOSCOPY (EGD) WITH PROPOFOL N/A 02/05/2020   Procedure: ESOPHAGOGASTRODUODENOSCOPY (EGD) WITH PROPOFOL;  Surgeon: Lemar Lofty., MD;  Location: [facility] ENDOSCOPY;   Service: Gastroenterology;  Laterality: N/A;   FLEXIBLE SIGMOIDOSCOPY N/A 02/05/2020   Procedure: FLEXIBLE SIGMOIDOSCOPY;  Surgeon: Meridee Score Netty Starring., MD;  Location: [facility] ENDOSCOPY;  Service: Gastroenterology;  Laterality: N/A;   SAVORY DILATION N/A 02/05/2020   Procedure: SAVORY DILATION;  Surgeon: Meridee Score Netty Starring., MD;  Location: [facility] ENDOSCOPY;  Service: Gastroenterology;  Laterality: N/A;   TOTAL HIP ARTHROPLASTY         No family history on file.  Social History   Tobacco Use   Smoking status: Never   Smokeless tobacco: Never   Tobacco comments:    maybe in 20's  Substance Use Topics   Alcohol use: Not Currently   Drug use: Never    Home Medications Prior to Admission medications   Medication Sig Start Date End Date Taking? Authorizing Provider  atorvastatin (LIPITOR) 40 MG tablet Take 40 mg by mouth daily.    [provider]  ELIQUIS 5 MG TABS tablet Take 5 mg by mouth 2 (two) times daily. 10/12/20   [provider]  metoprolol tartrate (LOPRESSOR) 25 MG tablet Take 0.5 tablets (12.5 mg total) by mouth 2 (two) times daily. 02/15/20 05/15/20  Christell Constant, MD  Multiple Vitamin (MULTIVITAMIN) tablet Take 1 tablet by mouth daily.    [provider]  pantoprazole (PROTONIX) 40 MG tablet TAKE 1 TABLET(40 MG) BY MOUTH TWICE DAILY 08/21/20   Mansouraty, Netty Starring., MD  polyethylene glycol (MIRALAX / GLYCOLAX) 17 g packet in the morning and at bedtime. 02/14/20   [provider]  Probiotic Product (PROBIOTIC ADVANCED PO) Take 500 mg by mouth daily in the afternoon.    [provider]  tamsulosin (FLOMAX) 0.4 MG CAPS capsule Take 0.4 mg by mouth daily after supper.     [provider]    Allergies    Patient has no known allergies.  Review of Systems   Review of Systems  Unable to perform ROS: Dementia   Physical Exam Updated Vital Signs BP (!) 147/95   Pulse 83   Temp 97.7 F (36.5 C) (Oral)   Resp  16   SpO2 98%   Physical Exam Vitals and nursing note reviewed.  Constitutional:      General: He is not in acute distress.    Appearance: Normal appearance. He is obese.  HENT:     Head: Normocephalic.  Eyes:     Conjunctiva/sclera: Conjunctivae normal.     Comments: No conjunctival pallor.  Cardiovascular:     Rate and Rhythm: Normal rate and regular rhythm.  Pulmonary:     Effort: Pulmonary effort is normal.     Breath sounds: No wheezing, rhonchi or rales.  Abdominal:     Palpations: Abdomen is soft.     Tenderness: There is no abdominal tenderness.  Musculoskeletal:        General: Normal range of motion.     Cervical back: Normal range of motion and neck supple.  Skin:    General: Skin is warm and dry.     Coloration: Skin is not pale.  Neurological:     Mental Status: He is alert.     Comments: Awake, alert, follows command. Not oriented to place or time. Baseline UE tremor.    ED Results / Procedures / Treatments   Labs (all labs ordered are listed, but only abnormal results are displayed) Labs Reviewed  COMPREHENSIVE METABOLIC PANEL - Abnormal; Notable for the following components:      Result Value   Glucose, Bld 123 (*)    BUN 35 (*)    Albumin 3.4 (*)    All other components within normal limits  PROTIME-INR - Abnormal; Notable for the following components:   Prothrombin Time 16.0 (*)    INR 1.3 (*)    All other components within normal limits  POC OCCULT BLOOD, ED - Abnormal; Notable for the following components:   Fecal Occult Bld POSITIVE (*)    All other components within normal limits  RESP PANEL BY RT-PCR (FLU A&B, COVID) ARPGX2  CBC  HEMOGLOBIN AND HEMATOCRIT, BLOOD  TYPE AND SCREEN    EKG None  Radiology No results found.  Procedures Procedures   Medications Ordered in ED Medications  0.9 %  sodium chloride infusion ( Intravenous New Bag/Given 12/26/20 0359)    ED Course  I have reviewed the triage vital signs and the nursing  notes.  Pertinent labs & imaging results that were available during my care of the patient were reviewed by me and considered in my medical decision making (see chart for details).    MDM Rules/Calculators/A&P                           Patient to ED with daughter who provides history. Here with dark, black, semi-solid stool, increasing frequency through the day, history of GI bleeding. He has melanic stool present in his diaper on exam. Abdominal exam benign.   The patient has been seen by Dr. Madilyn Hook who feels the  patient appears hemodynamically stable, but admission is felt indicated given continuous active bleeding/melena in an anticoagulated patient.   Discussed the patient with Dr. Lewis Moccasin, [facility], who will see the patient in evaluation for admission.    Final Clinical Impression(s) / ED Diagnoses Final diagnoses:  None   Melena History of GI bleed Coagulopathy   Rx / DC Orders ED Discharge Orders     None        Danne Harbor 12/26/20 7076    Tilden Fossa, MD 12/28/20 1259

## 2020-12-26 NOTE — Consult Note (Signed)
Consultation  Referring Provider:      Primary Care Physician:  [name], MD Primary Gastroenterologist:    N/A     Reason for Consultation:     GI bleed         HPI:   Ryan [name] is a 86 y.o. male with a history of advanced Alzheimer's dementia and paroxysmal A. fib on Eliquis who was brought in the emergency room yesterday by his daughter because of multiple dark and bloody bowel movements yesterday.  The patient had been in his usual state of health until yesterday afternoon when he passed a large dark bowel movement with red tint.  He has continued to pass multiple bloody bowel movements since then.  Although he has dementia and has difficulty describing what is feeling, he does not seem to be any abdominal pain and has not shown any nausea or vomiting.  His appetite has been normal per his daughter.  He has otherwise been at his baseline except for passing the bloody bowel movements.    On presentation the emergency room he was hemodynamically stable without hypotension or tachycardia.  Digital rectal exam showed a melanic stool that was Hemoccult positive.  His initial hemoglobin was at his baseline of 13 and was repeated 10 hours later and was stable on repeat this morning.  CMP was unremarkable.  His BUN was slightly elevated at 35, but this is not far from his baseline.  INR 1.3  The patient had a similar presentation in December of last year.  A CTA showed extensive diverticulosis but without active diverticular hemorrhaging.  He underwent an upper endoscopy which was notable for mild (LA grade A) reflux esophagitis and a Schatzki ring which was dilated with a 17 mm Savary, no peptic ulcer disease or significant inflammation of the stomach or duodenum.  A flexible sigmoidoscopy was attempted but was significantly limited by large stool burden.  The stool was solid and there is no evidence of blood in the stool and it was deemed that he had stopped bleeding.  Patient was discharged  home and his Eliquis was held for period of time.  His Eliquis was restarted several months ago and he had been doing well without any evidence of bleeding until yesterday.  Past Medical History:  Diagnosis Date   Alzheimer disease (HCC)    Benign prostatic hyperplasia 01/30/2020   Chronic anticoagulation    On eliquis   Gait disorder    Hypercholesterolemia    Hypertension    Mixed hyperlipidemia 01/30/2020   Paroxysmal atrial fibrillation [facility])     Past Surgical History:  Procedure Laterality Date   BIOPSY  02/05/2020   Procedure: BIOPSY;  Surgeon: Lemar Lofty., MD;  Location: [facility] ENDOSCOPY;  Service: Gastroenterology;;   CHOLECYSTECTOMY     ESOPHAGOGASTRODUODENOSCOPY (EGD) WITH PROPOFOL N/A 02/05/2020   Procedure: ESOPHAGOGASTRODUODENOSCOPY (EGD) WITH PROPOFOL;  Surgeon: Lemar Lofty., MD;  Location: [facility] ENDOSCOPY;  Service: Gastroenterology;  Laterality: N/A;   FLEXIBLE SIGMOIDOSCOPY N/A 02/05/2020   Procedure: FLEXIBLE SIGMOIDOSCOPY;  Surgeon: Meridee Score Netty Starring., MD;  Location: [facility] ENDOSCOPY;  Service: Gastroenterology;  Laterality: N/A;   SAVORY DILATION N/A 02/05/2020   Procedure: SAVORY DILATION;  Surgeon: Meridee Score Netty Starring., MD;  Location: [facility] ENDOSCOPY;  Service: Gastroenterology;  Laterality: N/A;   TOTAL HIP ARTHROPLASTY      History reviewed. No pertinent family history.   Social History   Tobacco Use   Smoking status: Never   Smokeless tobacco: Never   Tobacco  comments:    maybe in 20's  Substance Use Topics   Alcohol use: Not Currently   Drug use: Never    Prior to Admission medications   Medication Sig Start Date End Date Taking? Authorizing Provider  atorvastatin (LIPITOR) 40 MG tablet Take 40 mg by mouth daily.    [provider]  ELIQUIS 5 MG TABS tablet Take 5 mg by mouth 2 (two) times daily. 10/12/20   [provider]  metoprolol tartrate (LOPRESSOR) 25 MG tablet Take 0.5 tablets (12.5 mg total) by mouth 2  (two) times daily. 02/15/20 05/15/20  Christell Constant, MD  Multiple Vitamin (MULTIVITAMIN) tablet Take 1 tablet by mouth daily.    [provider]  pantoprazole (PROTONIX) 40 MG tablet TAKE 1 TABLET(40 MG) BY MOUTH TWICE DAILY 08/21/20   Mansouraty, Netty Starring., MD  polyethylene glycol (MIRALAX / GLYCOLAX) 17 g packet in the morning and at bedtime. 02/14/20   [provider]  Probiotic Product (PROBIOTIC ADVANCED PO) Take 500 mg by mouth daily in the afternoon.    [provider]  tamsulosin (FLOMAX) 0.4 MG CAPS capsule Take 0.4 mg by mouth daily after supper.     [provider]    Current Facility-Administered Medications  Medication Dose Route Frequency Provider Last Rate Last Admin   acetaminophen (TYLENOL) tablet 650 mg  650 mg Oral Q6H PRN Chotiner, Claudean Severance, MD       Or   acetaminophen (TYLENOL) suppository 650 mg  650 mg Rectal Q6H PRN Chotiner, Claudean Severance, MD       atorvastatin (LIPITOR) tablet 40 mg  40 mg Oral Daily Chotiner, Claudean Severance, MD       guaiFENesin (ROBITUSSIN) 100 MG/5ML liquid 5 mL  5 mL Oral Q4H PRN Amin, Ankit Chirag, MD       hydrALAZINE (APRESOLINE) injection 10 mg  10 mg Intravenous Q4H PRN Amin, Ankit Chirag, MD       lactated ringers infusion   Intravenous Continuous Chotiner, Claudean Severance, MD 75 mL/hr at 12/26/20 0647 New Bag at 12/26/20 0647   metoprolol tartrate (LOPRESSOR) injection 5 mg  5 mg Intravenous Q4H PRN Amin, Ankit Chirag, MD       metoprolol tartrate (LOPRESSOR) tablet 12.5 mg  12.5 mg Oral BID Chotiner, Claudean Severance, MD       senna-docusate (Senokot-S) tablet 1 tablet  1 tablet Oral QHS PRN Amin, Ankit Chirag, MD       tamsulosin (FLOMAX) capsule 0.4 mg  0.4 mg Oral QPC supper Chotiner, Claudean Severance, MD       Current Outpatient Medications  Medication Sig Dispense Refill   atorvastatin (LIPITOR) 40 MG tablet Take 40 mg by mouth daily.     ELIQUIS 5 MG TABS tablet Take 5 mg by mouth 2 (two) times daily.      metoprolol tartrate (LOPRESSOR) 25 MG tablet Take 0.5 tablets (12.5 mg total) by mouth 2 (two) times daily. 90 tablet 3   Multiple Vitamin (MULTIVITAMIN) tablet Take 1 tablet by mouth daily.     pantoprazole (PROTONIX) 40 MG tablet TAKE 1 TABLET(40 MG) BY MOUTH TWICE DAILY 180 tablet 2   polyethylene glycol (MIRALAX / GLYCOLAX) 17 g packet in the morning and at bedtime.     Probiotic Product (PROBIOTIC ADVANCED PO) Take 500 mg by mouth daily in the afternoon.     tamsulosin (FLOMAX) 0.4 MG CAPS capsule Take 0.4 mg by mouth daily after supper.       Allergies as  of 12/25/2020   (No Known Allergies)     Review of Systems:    As per HPI, otherwise negative    Physical Exam:  Vital signs in last 24 hours: Temp:  [97.7 F (36.5 C)-98.6 F (37 C)] 97.7 F (36.5 C) (10/26 0127) Pulse Rate:  [51-153] 51 (10/26 0815) Resp:  [15-23] 23 (10/26 0815) BP: (131-170)/(86-140) 159/94 (10/26 0815) SpO2:  [94 %-98 %] 97 % (10/26 0815)   General:   Pleasant well-developed Caucasian male in NAD, accompanied by his daughter.  The daughter provided all of the HPI Head:  Normocephalic and atraumatic. Eyes:   No icterus.   Conjunctiva pink. Ears: Upper dentures Neck:  Supple Lungs:  Respirations even and unlabored. Lungs clear to auscultation bilaterally.   No wheezes, crackles, or rhonchi.  Heart:  Regular rate and rhythm; no MRG Abdomen:  Soft, nondistended, nontender. Normal bowel sounds. No appreciable masses or hepatomegaly.  Rectal: Digital rectal exam not performed, but there was copious amount of maroon-colored stool in his diaper Msk:  Symmetrical without gross deformities.  Extremities:  Without edema. Neurologic:  Alert and  oriented x1;  grossly normal neurologically. Skin:  Intact without significant lesions or rashes. Psych:  Alert and cooperative. Normal affect.  Patient did show signs of anxiety and required consultation by his daughter  LAB RESULTS: Recent Labs    12/25/20 1820  12/26/20 0313  WBC 7.6  --   HGB 13.4 13.2  HCT 41.4 41.2  PLT 152  --    BMET Recent Labs    12/25/20 1820  NA 139  K 4.1  CL 104  CO2 27  GLUCOSE 123*  BUN 35*  CREATININE 1.17  CALCIUM 9.0   LFT Recent Labs    12/25/20 1820  PROT 6.5  ALBUMIN 3.4*  AST 18  ALT 20  ALKPHOS 71  BILITOT 1.0   PT/INR Recent Labs    12/25/20 1820  LABPROT 16.0*  INR 1.3*    STUDIES: No results found. CT angio and November 2021 showed diverticulosis throughout the colon without active diverticular hemorrhage  PREVIOUS ENDOSCOPIES:            EGD December 2021: LA grade a esophagitis, Schatzki ring dilated with 17 mm Savary Flexible sigmoidoscopy December 2021: Significantly limited by stool burden, no blood or evidence of active bleeding   Impression / Plan:   86 year old male with Alzheimer's dementia and atrial fibrillation on Eliquis with 1 day of recurrent bloody bowel movements, variably described as dark and maroon/red.  The stool present during my exam was maroon.  He does not have any other GI symptoms such as abdominal pain, nausea or vomiting.  He has known extensive diverticulosis involving most of his colon.  I suspect he has a right-sided diverticular bleed.  I discussed with the patient's daughter how the vast majority of diverticular bleeds stopped spontaneously and do not require intervention.  It has been reassuring that the patient's hemoglobin was stable from overnight till this morning and that his vitals have been normal.  It is possible, however that the patient will continue to bleed.  In the event that he does demonstrate more significant bleeding, I would recommend repeating CT angio and attempting embolization.  I think it would be very difficult to get an adequate bowel prep in this patient with advanced dementia.  This would likely require an NG tube.  Low suspicion for an upper GI source.  For now, I recommend observation.  The role  of Eliquis in this patient  should also be reconsidered.  Hematochezia, presumed diverticular bleed - Observe, supportive care for now - If ongoing evidence of bloody bowel movements this afternoon, recommend CTA - Continue to hold Eliquis, and readdress risks and benefits of ongoing anticoagulation - Clear liquid diet okay per GI - Check CBC every 12 hours  Thanks   LOS: 0 days   Jenel Lucks  12/26/2020, 9:26 AM

## 2020-12-26 NOTE — H&P (Signed)
History and Physical    [name]:[id number] DOB: 1934-04-27 DOA: 12/25/2020  PCP: [name], MD   Patient coming from: Home  Chief Complaint: Melanotic stool  HPI: Ryan [name] is a 86 y.o. male with medical history significant for Alzheimer's dementia, hypertension, BPH, PAF on Eliquis who lives with his daughter and has 24-hour in-home care.  Daughter is at the bedside and reports that yesterday morning Ryan [name] woke up and had black tarry stool that was very foul-smelling and was mixed with scant amount of bright red blood on the surface of it.  Over the course the day he had a few more episodes of black tarry stool and with his history of having GI bleed in the past she brought in for evaluation.  In December 2021 he had GI bleeding that was suspected to be diverticular bleed but had stopped on its own and gastroenterology did not feel colonoscopy was indicated.  [name] has not complained of any abdominal pain he has not had any vomiting or syncope.  He has not complained of any chest pain or palpitations.  He has not had any change in his diet recently according to the daughter  ED Course: Has been hemodynamically stable in the emergency room.  He had melanotic stool that was Hemoccult positive.  Initial hemoglobin was 13.4.  10 hours later hemoglobin was rechecked and is 13.2.  CMP is unremarkable.  White blood cell count is normal at 7600.  Platelet count 152,000.  INR 1.3.  COVID swab is negative.  Influenza A and B are negative. Daughter is concerned about patient having further GI bleeding would like gastroenterology to evaluate patient to hospital service was asked to observe patient and manage awaiting GI evaluation  Review of Systems:  Cannot obtain review of systems secondary to dementia  Past Medical History:  Diagnosis Date   Alzheimer disease (HCC)    Benign prostatic hyperplasia 01/30/2020   Chronic anticoagulation    On eliquis   Gait disorder     Hypercholesterolemia    Hypertension    Mixed hyperlipidemia 01/30/2020   Paroxysmal atrial fibrillation [facility])     Past Surgical History:  Procedure Laterality Date   BIOPSY  02/05/2020   Procedure: BIOPSY;  Surgeon: Lemar Lofty., MD;  Location: [facility] ENDOSCOPY;  Service: Gastroenterology;;   CHOLECYSTECTOMY     ESOPHAGOGASTRODUODENOSCOPY (EGD) WITH PROPOFOL N/A 02/05/2020   Procedure: ESOPHAGOGASTRODUODENOSCOPY (EGD) WITH PROPOFOL;  Surgeon: Lemar Lofty., MD;  Location: [facility] ENDOSCOPY;  Service: Gastroenterology;  Laterality: N/A;   FLEXIBLE SIGMOIDOSCOPY N/A 02/05/2020   Procedure: FLEXIBLE SIGMOIDOSCOPY;  Surgeon: Meridee Score Netty Starring., MD;  Location: [facility] ENDOSCOPY;  Service: Gastroenterology;  Laterality: N/A;   SAVORY DILATION N/A 02/05/2020   Procedure: SAVORY DILATION;  Surgeon: Meridee Score Netty Starring., MD;  Location: [facility] ENDOSCOPY;  Service: Gastroenterology;  Laterality: N/A;   TOTAL HIP ARTHROPLASTY      Social History  reports that he has never smoked. He has never used smokeless tobacco. He reports that he does not currently use alcohol. He reports that he does not use drugs.  No Known Allergies  History reviewed. No pertinent family history.   Prior to Admission medications   Medication Sig Start Date End Date Taking? Authorizing Provider  atorvastatin (LIPITOR) 40 MG tablet Take 40 mg by mouth daily.    [provider]  ELIQUIS 5 MG TABS tablet Take 5 mg by mouth 2 (two) times daily. 10/12/20   [provider]  metoprolol tartrate (LOPRESSOR) 25 MG tablet Take 0.5 tablets (12.5 mg total) by mouth 2 (two) times daily. 02/15/20 05/15/20  Christell Constant, MD  Multiple Vitamin (MULTIVITAMIN) tablet Take 1 tablet by mouth daily.    [provider]  pantoprazole (PROTONIX) 40 MG tablet TAKE 1 TABLET(40 MG) BY MOUTH TWICE DAILY 08/21/20   Mansouraty, Netty Starring., MD  polyethylene glycol (MIRALAX / GLYCOLAX) 17 g packet in the  morning and at bedtime. 02/14/20   [provider]  Probiotic Product (PROBIOTIC ADVANCED PO) Take 500 mg by mouth daily in the afternoon.    [provider]  tamsulosin (FLOMAX) 0.4 MG CAPS capsule Take 0.4 mg by mouth daily after supper.     [provider]    Physical Exam: Vitals:   12/25/20 2131 12/26/20 0127 12/26/20 0300 12/26/20 0400  BP: (!) 151/105 (!) 158/108 (!) 154/140 (!) 147/95  Pulse: 78 67 (!) 52 83  Resp: 16 18 15 16   Temp: 98.3 F (36.8 C) 97.7 F (36.5 C)    TempSrc: Oral Oral    SpO2: 95% 97% 97% 98%    Constitutional: NAD, calm, comfortable Vitals:   12/25/20 2131 12/26/20 0127 12/26/20 0300 12/26/20 0400  BP: (!) 151/105 (!) 158/108 (!) 154/140 (!) 147/95  Pulse: 78 67 (!) 52 83  Resp: 16 18 15 16   Temp: 98.3 F (36.8 C) 97.7 F (36.5 C)    TempSrc: Oral Oral    SpO2: 95% 97% 97% 98%   General: WDWN, Alert and oriented to self  Eyes: EOMI, PERRL, conjunctivae normal.  Sclera nonicteric HENT:  [place]/AT, external ears normal.  Nares patent without epistasis.  Mucous membranes are moist. Posterior pharynx clear  Neck: Soft, normal range of motion, supple, no masses, Trachea midline Respiratory: clear to auscultation bilaterally, no wheezing, no crackles. Normal respiratory effort. No accessory muscle use.  Cardiovascular: Regular rate and rhythm, no murmurs / rubs / gallops. No extremity edema.  Abdomen: Soft, no tenderness, nondistended, no rebound or guarding.  No masses palpated. Bowel sounds normoactive Musculoskeletal: FROM. no cyanosis. Normal muscle tone.  Skin: Warm, dry, intact no rashes, lesions, ulcers. No induration Neurologic: CN 2-12 grossly intact.  Normal speech. Strength 5/5 in all extremities.     Labs on Admission: I have personally reviewed following labs and imaging studies  CBC: Recent Labs  Lab 12/25/20 1820 12/26/20 0313  WBC 7.6  --   HGB 13.4 13.2  HCT 41.4 41.2  MCV 87.9  --   PLT 152  --      Basic Metabolic Panel: Recent Labs  Lab 12/25/20 1820  NA 139  K 4.1  CL 104  CO2 27  GLUCOSE 123*  BUN 35*  CREATININE 1.17  CALCIUM 9.0    GFR: CrCl cannot be calculated (Unknown ideal weight.).  Liver Function Tests: Recent Labs  Lab 12/25/20 1820  AST 18  ALT 20  ALKPHOS 71  BILITOT 1.0  PROT 6.5  ALBUMIN 3.4*    Urine analysis: No results found for: COLORURINE, APPEARANCEUR, LABSPEC, PHURINE, GLUCOSEU, HGBUR, BILIRUBINUR, KETONESUR, PROTEINUR, UROBILINOGEN, NITRITE, LEUKOCYTESUR  Radiological Exams on Admission: No results found.  EKG: Independently reviewed.  EKG shows atrial fibrillation with left leg ST changes in the lateral leads but no acute ST elevation or depression.  QTc prolonged at 484  Assessment/Plan Principal Problem:   GI bleeding Mr. Alberta is placed on MedSurg floor for observation.  He has been having melanotic stools since yesterday.  His vital  signs are stable and his hemoglobin level is stable.  Initial hemoglobin was 13.4 and 10 hours later it was rechecked and is 13.2. Daughter is concerned about the GI bleeding and would like gastroenterology to evaluate patient before going home. We will recheck hemoglobin hematocrit at 930 this morning. Gentle IV fluid hydration with LR at 75 ml/hr Hold Eliquis Has not had change in appetite or complaints of pain per daughter.  Active Problems:   AF (paroxysmal atrial fibrillation)  Chronic.  Eliquis will be held with GI bleeding    Primary hypertension Continue metoprolol.  Monitor blood pressure    Alzheimer disease  Stable.    Prolonged QT interval Avoid medications which could further prolong QT interval   DVT prophylaxis: Is on Eliquis which is held. SCDs for DVT prophylaxis.   Code Status:   Full code  Family Communication:  Diagnosis and plan discussed with patient and her his daughter who is at bedside.  Daughter verbalized understanding.  Questions answered.  Further  recommendation follow as clinical indicated Disposition Plan:   Patient is from:  Home  Anticipated DC to:  Home  Anticipated DC date:  Anticipate less than 2 midnight stay  Consults called:  Gastroenterology, secure chat sent to Dr. Leonides Schanz of [facility]  Admission status:  Observation   Claudean Severance Jesstin Studstill MD [facility]  How to contact the [facility] Attending or Consulting provider 7A - 7P or covering provider during after hours 7P -7A, for this patient?   Check the care team in [facility] and look for a) attending/consulting [facility] provider listed and b) the [facility] team listed Log into [email or web address] and use [place]'s universal password to access. If you do not have the password, please contact the hospital operator. Locate the [facility] provider you are looking for under [facility] and page to a number that you can be directly reached. If you still have difficulty reaching the provider, please page the [facility] (Director on Call) for the Hospitalists listed on amion for assistance.  12/26/2020, 5:06 AM

## 2020-12-26 NOTE — Progress Notes (Signed)
Pt with another episode of dark red blood per rectum: 1) H/H Q6H ordered 2) CTA GIB ordered

## 2020-12-27 ENCOUNTER — Other Ambulatory Visit: Payer: Self-pay

## 2020-12-27 DIAGNOSIS — I480 Paroxysmal atrial fibrillation: Secondary | ICD-10-CM | POA: Diagnosis not present

## 2020-12-27 DIAGNOSIS — Z79899 Other long term (current) drug therapy: Secondary | ICD-10-CM | POA: Diagnosis not present

## 2020-12-27 DIAGNOSIS — N401 Enlarged prostate with lower urinary tract symptoms: Secondary | ICD-10-CM | POA: Diagnosis present

## 2020-12-27 DIAGNOSIS — K5909 Other constipation: Secondary | ICD-10-CM | POA: Diagnosis present

## 2020-12-27 DIAGNOSIS — K2100 Gastro-esophageal reflux disease with esophagitis, without bleeding: Secondary | ICD-10-CM | POA: Diagnosis present

## 2020-12-27 DIAGNOSIS — I119 Hypertensive heart disease without heart failure: Secondary | ICD-10-CM | POA: Diagnosis present

## 2020-12-27 DIAGNOSIS — K2971 Gastritis, unspecified, with bleeding: Secondary | ICD-10-CM | POA: Diagnosis not present

## 2020-12-27 DIAGNOSIS — F0280 Dementia in other diseases classified elsewhere without behavioral disturbance: Secondary | ICD-10-CM | POA: Diagnosis present

## 2020-12-27 DIAGNOSIS — Z20822 Contact with and (suspected) exposure to covid-19: Secondary | ICD-10-CM | POA: Diagnosis present

## 2020-12-27 DIAGNOSIS — E782 Mixed hyperlipidemia: Secondary | ICD-10-CM | POA: Diagnosis present

## 2020-12-27 DIAGNOSIS — G309 Alzheimer's disease, unspecified: Secondary | ICD-10-CM | POA: Diagnosis present

## 2020-12-27 DIAGNOSIS — K5732 Diverticulitis of large intestine without perforation or abscess without bleeding: Secondary | ICD-10-CM

## 2020-12-27 DIAGNOSIS — I482 Chronic atrial fibrillation, unspecified: Secondary | ICD-10-CM | POA: Diagnosis present

## 2020-12-27 DIAGNOSIS — K5733 Diverticulitis of large intestine without perforation or abscess with bleeding: Secondary | ICD-10-CM | POA: Diagnosis present

## 2020-12-27 DIAGNOSIS — Z6825 Body mass index (BMI) 25.0-25.9, adult: Secondary | ICD-10-CM | POA: Diagnosis not present

## 2020-12-27 DIAGNOSIS — Z96642 Presence of left artificial hip joint: Secondary | ICD-10-CM | POA: Diagnosis present

## 2020-12-27 DIAGNOSIS — K921 Melena: Secondary | ICD-10-CM | POA: Diagnosis present

## 2020-12-27 DIAGNOSIS — I10 Essential (primary) hypertension: Secondary | ICD-10-CM | POA: Diagnosis not present

## 2020-12-27 DIAGNOSIS — N39490 Overflow incontinence: Secondary | ICD-10-CM | POA: Diagnosis present

## 2020-12-27 DIAGNOSIS — K5791 Diverticulosis of intestine, part unspecified, without perforation or abscess with bleeding: Secondary | ICD-10-CM | POA: Diagnosis not present

## 2020-12-27 DIAGNOSIS — E669 Obesity, unspecified: Secondary | ICD-10-CM | POA: Diagnosis present

## 2020-12-27 DIAGNOSIS — Z7901 Long term (current) use of anticoagulants: Secondary | ICD-10-CM | POA: Diagnosis not present

## 2020-12-27 LAB — CBC
HCT: 34.7 % — ABNORMAL LOW (ref 39.0–52.0)
Hemoglobin: 11.7 g/dL — ABNORMAL LOW (ref 13.0–17.0)
MCH: 29 pg (ref 26.0–34.0)
MCHC: 33.7 g/dL (ref 30.0–36.0)
MCV: 85.9 fL (ref 80.0–100.0)
Platelets: 140 10*3/uL — ABNORMAL LOW (ref 150–400)
RBC: 4.04 MIL/uL — ABNORMAL LOW (ref 4.22–5.81)
RDW: 14.5 % (ref 11.5–15.5)
WBC: 9.8 10*3/uL (ref 4.0–10.5)
nRBC: 0 % (ref 0.0–0.2)

## 2020-12-27 LAB — BASIC METABOLIC PANEL
Anion gap: 5 (ref 5–15)
BUN: 19 mg/dL (ref 8–23)
CO2: 25 mmol/L (ref 22–32)
Calcium: 8.2 mg/dL — ABNORMAL LOW (ref 8.9–10.3)
Chloride: 106 mmol/L (ref 98–111)
Creatinine, Ser: 0.98 mg/dL (ref 0.61–1.24)
GFR, Estimated: >60 mL/min (ref >60)
Glucose, Bld: 96 mg/dL (ref 70–99)
Potassium: 3.7 mmol/L (ref 3.5–5.1)
Sodium: 136 mmol/L (ref 135–145)

## 2020-12-27 LAB — HEMOGLOBIN AND HEMATOCRIT, BLOOD
HCT: 33.6 % — ABNORMAL LOW (ref 39.0–52.0)
HCT: 33.3 % — ABNORMAL LOW (ref 39.0–52.0)
HCT: 35.4 % — ABNORMAL LOW (ref 39.0–52.0)
Hemoglobin: 11.1 g/dL — ABNORMAL LOW (ref 13.0–17.0)
Hemoglobin: 10.9 g/dL — ABNORMAL LOW (ref 13.0–17.0)
Hemoglobin: 11.4 g/dL — ABNORMAL LOW (ref 13.0–17.0)

## 2020-12-27 LAB — MAGNESIUM: Magnesium: 1.8 mg/dL (ref 1.7–2.4)

## 2020-12-27 MED ORDER — AMOXICILLIN-POT CLAVULANATE 875-125 MG PO TABS
1.0000 | ORAL_TABLET | Freq: Two times a day (BID) | ORAL | Status: DC
  Administered 2020-12-27 – 2020-12-31 (×9): 1 via ORAL
  Filled 2020-12-27 (×9): qty 1

## 2020-12-27 NOTE — Progress Notes (Signed)
[facility] ROUNDING NOTE   Subjective: Patient passed more bloody stool overnight and again this morning, but his hemoglobin has remained stable since yesterday afternoon.  CT-A last night negative for active bleeding, but did show findings suggestive of diverticulitis.  Patient is not febrile and denies any other GI symptoms.   Objective: Vital signs in last 24 hours: Temp:  [97.5 F (36.4 C)-98 F (36.7 C)] 97.9 F (36.6 C) (10/27 0749) Pulse Rate:  [58-101] 65 (10/27 0802) Resp:  [13-21] 18 (10/27 0749) BP: (128-180)/(75-107) 157/107 (10/27 0749) SpO2:  [90 %-100 %] 95 % (10/27 0312) Weight:  [82 kg] 82 kg (10/26 2335) Last BM Date: 12/26/20 General: NAD, pleasant, demented Caucasian male, accompanied by caretaker Lungs:  CTA b/l, no w/r/r Heart:  RRR, no m/r/g Abdomen:  Soft, NT, ND, +BS, rectal tube in place, no stool in bag Ext:  No c/c/e    Intake/Output from previous day: 10/26 0701 - 10/27 0700 In: 196.7 [I.V.:196.7] Out: -  Intake/Output this shift: No intake/output data recorded.   Lab Results: Recent Labs    12/25/20 1820 12/26/20 0313 12/26/20 1127 12/26/20 2038 12/27/20 0349  WBC 7.6  --   --   --  9.8  HGB 13.4   < > 11.6* 11.6* 11.7*  PLT 152  --   --   --  140*  MCV 87.9  --   --   --  85.9   < > = values in this interval not displayed.   BMET Recent Labs    12/25/20 1820 12/27/20 0349  NA 139 136  K 4.1 3.7  CL 104 106  CO2 27 25  GLUCOSE 123* 96  BUN 35* 19  CREATININE 1.17 0.98  CALCIUM 9.0 8.2*   LFT Recent Labs    12/25/20 1820  PROT 6.5  ALBUMIN 3.4*  AST 18  ALT 20  ALKPHOS 71  BILITOT 1.0   PT/INR Recent Labs    12/25/20 1820  INR 1.3*      Imaging/Other results: CT ANGIO GI BLEED  Result Date: 12/26/2020 CLINICAL DATA:  AFib on Eliquis.  Dark red blood per rectum. EXAM: CTA ABDOMEN AND PELVIS WITHOUT AND WITH CONTRAST TECHNIQUE: Multidetector CT imaging of the abdomen and pelvis was  performed using the standard protocol during bolus administration of intravenous contrast. Multiplanar reconstructed images and MIPs were obtained and reviewed to evaluate the vascular anatomy. CONTRAST:  OMNIPAQUE IOHEXOL 350 MG/ML SOLN COMPARISON:  CT abdomen and pelvis 01/31/2020 FINDINGS: VASCULAR Aorta: Normal caliber aorta without aneurysm, dissection, vasculitis or significant stenosis. There is calcified atherosclerotic disease in the aorta. 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 Branches appear patent. No significant stenosis. SMA: Patent without evidence of aneurysm, dissection, vasculitis or significant stenosis. 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 Renal arteries are otherwise within normal limits. IMA: Patent without evidence of aneurysm, dissection, vasculitis or significant stenosis. Inflow: Patent without evidence of aneurysm, dissection, vasculitis or significant stenosis. Proximal Outflow: Bilateral common femoral and visualized portions of the superficial and profunda femoral arteries are patent without evidence of aneurysm, dissection, vasculitis or significant stenosis. Veins: Infrarenal IVC filter in place which appears unchanged in size. On arterial phase imaging there is some vascularity seen posterior to the IVC at the level of the filter with some questionable arterial feeder vessel. There is some contrast opacification in the  IVC at this level. Findings are unchanged from the prior examination. No thrombus identified. Review of the MIP images confirms the above findings. NON-VASCULAR Lower chest: There is atelectasis in the lung bases. The heart is enlarged Hepatobiliary: There is a hypodensity in the right lobe of the liver which is too small to characterize, likely a cyst or hemangioma. Gallbladder surgically absent. Pancreas: Unremarkable. No pancreatic ductal  dilatation or surrounding inflammatory changes. Spleen: Normal in size without focal abnormality. Adrenals/Urinary Tract: There is a 2 cm right renal cyst. There is a 4.1 cm left renal cyst. There are additional hypodensities in both kidneys which are too small to characterize. There is no hydronephrosis or perinephric fluid. Bladder is distended. Numerous bladder diverticula are again seen. Bladder wall trabeculation is again noted. Adrenal glands are within normal limits. Stomach/Bowel: There are no areas of active extravasation of contrast on arterial or portal venous phase imaging within the bowel. There is no evidence for bowel obstruction or free air. There is diffuse colonic diverticulosis. There is mild inflammatory stranding surrounding diverticula in the distal descending colon compatible with acute diverticulitis. There is no evidence for perforation or abscess. Lymphatic: Aortic atherosclerosis. No enlarged abdominal or pelvic lymph nodes. Reproductive: Prostate gland is mildly enlarged, unchanged. Other: There is no free fluid. There is a small fat containing umbilical hernia. Musculoskeletal: There are healed right-sided rib fractures. Multilevel degenerative changes affect the spine. There are degenerative changes of the right hip. Left hip arthroplasty is present. IMPRESSION: VASCULAR 1. No evidence for active gastrointestinal bleeding. 2. IVC filter in place. There is vascularity posterior to the IVC at the level of the filter with questionable arterial feeder vessels and contrast opacification of the IVC on arterial phase imaging. Findings are suspicious for AV malformation or fistula at this level. 3. 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 4. 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 NON-VASCULAR 1. Acute uncomplicated descending colon diverticulitis. 2. Stable trabeculated bladder wall with multiple bladder diverticula suggesting chronic bladder outlet obstruction.  Electronically Signed   By: Darliss Cheney M.D.   On: 12/26/2020 21:41      Assessment and Plan:  86 year old male with Alzheimer's and atrial fibrillation on Eliquis, presented with profuse bloody bowel movements x 1 day, consistent with a diverticular bleed.  His hemoglobin dropped yesterday afternoon, but has remained stable overnight.  Continued to pass blood as recently as 3 am.  Hemodynamically stable with elevated blood pressures.  CT-A last night with no active bleeding, but notable for mild uncomplicated diverticulitis.  Pain is not noticeably tender to exam and is afebrile with a normal WBC.  GI bleed, presumed diverticular - Appears to have slowed, stable Hgb and vitals - Continue to monitor, no plans for endoscopic examination currently - Ok to advance diet to soft from GI standpoint - Check CBC q12hrs - Would prefer to see normal appearing stool before discharge home - Continue to hold anticoagulation.  Follow up with PCM to readdress risks/benefits of anticoagulation  Left sided diverticulitis, uncomplicated - Minimal TTP, afebrile, normal WBC - Reasonable to treat with short course of Augmentin given his age and comorbidities - Advance diet to soft as mentioned above   Jenel Lucks, MD  12/27/2020, 9:25 AM [facility]

## 2020-12-27 NOTE — Care Management Obs Status (Signed)
MEDICARE OBSERVATION STATUS NOTIFICATION   Patient Details  Name: Ryan [name] MRN: [id number] Date of Birth: [birth year]   Medicare Observation Status Notification Given:  Yes  Patient unavailable, notice given to Madaline Savage, daughter verbal permission given to sign as it was by phone  Lockie Pares, RN 12/27/2020, 9:44 AM

## 2020-12-27 NOTE — Progress Notes (Signed)
Ryan [name]  ZOX:[id number] DOB: 06-26-1934 DOA: 12/25/2020 PCP: [name], MD   Brief Narrative:  86 year old with history of Alzheimer's, atrial fibrillation on Eliquis comes to the hospital with profuse bloody bowel movement.  Got diagnosed with likely diverticular bleed but due to persistent bleeding he had CTA done which did not show any active bleeding but did show uncomplicated mild diverticulitis of descending colon.  He was started on Augmentin.  His Eliquis was held.  Patient was seen by GI team as well.  His diet was slowly advanced.   Assessment & Plan:   Principal Problem:   GI bleeding Active Problems:   AF (paroxysmal atrial fibrillation) (HCC)   Primary hypertension   Alzheimer disease (HCC)   Prolonged QT interval   Lower GI bleeding, likely diverticular - Hemoglobin appears to be overall stable around 11.5.  Baseline is 13.5.  Eliquis is on hold.  CTA is negative for any active bleeding but does show uncomplicated diverticulitis.  Continue to monitor hemoglobin.  Appreciate GI input.  Uncomplicated diverticulitis of descending colon - Overall asymptomatic but will treated with 7 days of oral Augmentin.  Diet as tolerated.  History of paroxysmal atrial fibrillation - Continue metoprolol but will continue to hold Eliquis.  Patient and family/daughters have been advised to discuss long-term anticoagulation with the PCP and they understand.  In light of GI bleeding, Eliquis should be held for at least next 5 days after bleeding is subsided before considering resuming it  Essential hypertension - Resume home meds  Alzheimer's dementia - Stable     DVT prophylaxis: SCDs Code Status: Full code Family Communication: Spoke with daughter over the phone, caregiver is also at bedside this morning   Nutritional status           Body mass index is 25.94 kg/m.  Pressure Injury 01/31/20 Leg Left 2 full thickness wounds to left upper  thigh (Active)  01/31/20 0625  Location: Leg  Location Orientation: Left  Staging:   Wound Description (Comments): 2 full thickness wounds to left upper thigh  Present on Admission: Yes          Subjective: Pleasantly  confused. Eating breakfast with the help of his caregiver.   Review of Systems Otherwise negative except as per HPI, including: General: Denies fever, chills, night sweats or unintended weight loss. Resp: Denies cough, wheezing, shortness of breath. Cardiac: Denies chest pain, palpitations, orthopnea, paroxysmal nocturnal dyspnea. GI: Denies abdominal pain, nausea, vomiting, diarrhea or constipation GU: Denies dysuria, frequency, hesitancy or incontinence MS: Denies muscle aches, joint pain or swelling Neuro: Denies headache, neurologic deficits (focal weakness, numbness, tingling), abnormal gait Psych: Denies anxiety, depression, SI/HI/AVH Skin: Denies new rashes or lesions ID: Denies sick contacts, exotic exposures, travel  Examination:  General exam: Appears calm and comfortable  Respiratory system: Clear to auscultation. Respiratory effort normal. Cardiovascular system: S1 & S2 heard, RRR. No JVD, murmurs, rubs, gallops or clicks. No pedal edema. Gastrointestinal system: Abdomen is nondistended, soft and nontender. No organomegaly or masses felt. Normal bowel sounds heard. Central nervous system: Alert and oriented to name only (baseline). No focal neurological deficits. Extremities: Symmetric 5 x 5 power. Skin: No rashes, lesions or ulcers Psychiatry: Judgement and insight appear poor   Objective: Vitals:   12/27/20 0312 12/27/20 0749 12/27/20 0802 12/27/20 1001  BP: (!) 128/91 (!) 157/107  (!) 83/54  Pulse: 88  65 (!) 57  Resp: 19 18    Temp:  97.9  F (36.6 C)    TempSrc:  Oral    SpO2: 95%     Weight:      Height:       No intake or output data in the 24 hours ending 12/27/20 1052 Filed Weights   12/26/20 2335  Weight: 82 kg      Data Reviewed:   CBC: Recent Labs  Lab 12/25/20 1820 12/26/20 0313 12/26/20 1127 12/26/20 2038 12/27/20 0349 12/27/20 0926  WBC 7.6  --   --   --  9.8  --   HGB 13.4 13.2 11.6* 11.6* 11.7* 11.1*  HCT 41.4 41.2 35.9* 35.3* 34.7* 33.6*  MCV 87.9  --   --   --  85.9  --   PLT 152  --   --   --  140*  --    Basic Metabolic Panel: Recent Labs  Lab 12/25/20 1820 12/27/20 0349  NA 139 136  K 4.1 3.7  CL 104 106  CO2 27 25  GLUCOSE 123* 96  BUN 35* 19  CREATININE 1.17 0.98  CALCIUM 9.0 8.2*  MG  --  1.8   GFR: Estimated Creatinine Clearance: 57.9 mL/min (by C-G formula based on SCr of 0.98 mg/dL). Liver Function Tests: Recent Labs  Lab 12/25/20 1820  AST 18  ALT 20  ALKPHOS 71  BILITOT 1.0  PROT 6.5  ALBUMIN 3.4*   No results for input(s): LIPASE, AMYLASE in the last 168 hours. No results for input(s): AMMONIA in the last 168 hours. Coagulation Profile: Recent Labs  Lab 12/25/20 1820  INR 1.3*   Cardiac Enzymes: No results for input(s): CKTOTAL, CKMB, CKMBINDEX, TROPONINI in the last 168 hours. BNP (last 3 results) No results for input(s): PROBNP in the last 8760 hours. HbA1C: No results for input(s): HGBA1C in the last 72 hours. CBG: No results for input(s): GLUCAP in the last 168 hours. Lipid Profile: No results for input(s): CHOL, HDL, LDLCALC, TRIG, CHOLHDL, LDLDIRECT in the last 72 hours. Thyroid Function Tests: No results for input(s): TSH, T4TOTAL, FREET4, T3FREE, THYROIDAB in the last 72 hours. Anemia Panel: No results for input(s): VITAMINB12, FOLATE, FERRITIN, TIBC, IRON, RETICCTPCT in the last 72 hours. Sepsis Labs: No results for input(s): PROCALCITON, LATICACIDVEN in the last 168 hours.  Recent Results (from the past 240 hour(s))  Resp Panel by RT-PCR (Flu A&B, Covid) Nasopharyngeal Swab     Status: None   Collection Time: 12/26/20  3:12 AM   Specimen: Nasopharyngeal Swab; Nasopharyngeal(NP) swabs in vial transport medium  Result  Value Ref Range Status   SARS Coronavirus 2 by RT PCR NEGATIVE NEGATIVE Final    Comment: (NOTE) SARS-CoV-2 target nucleic acids are NOT DETECTED.  The SARS-CoV-2 RNA is generally detectable in upper respiratory specimens during the acute phase of infection. The lowest concentration of SARS-CoV-2 viral copies this assay can detect is 138 copies/mL. A negative result does not preclude SARS-Cov-2 infection and should not be used as the sole basis for treatment or other patient management decisions. A negative result may occur with  improper specimen collection/handling, submission of specimen other than nasopharyngeal swab, presence of viral mutation(s) within the areas targeted by this assay, and inadequate number of viral copies(<138 copies/mL). A negative result must be combined with clinical observations, patient history, and epidemiological information. The expected result is Negative.  Fact Sheet for Patients:  BloggerCourse.com  Fact Sheet for Healthcare Providers:  SeriousBroker.it  This test is no t yet approved or cleared by the [place]  FDA and  has been authorized for detection and/or diagnosis of SARS-CoV-2 by FDA under an Emergency Use Authorization (EUA). This EUA will remain  in effect (meaning this test can be used) for the duration of the COVID-19 declaration under Section 564(b)(1) of the Act, 21 U.S.C.section 360bbb-3(b)(1), unless the authorization is terminated  or revoked sooner.       Influenza A by PCR NEGATIVE NEGATIVE Final   Influenza B by PCR NEGATIVE NEGATIVE Final    Comment: (NOTE) The Xpert Xpress SARS-CoV-2/FLU/RSV plus assay is intended as an aid in the diagnosis of influenza from Nasopharyngeal swab specimens and should not be used as a sole basis for treatment. Nasal washings and aspirates are unacceptable for Xpert Xpress SARS-CoV-2/FLU/RSV testing.  Fact Sheet for  Patients: BloggerCourse.com  Fact Sheet for Healthcare Providers: SeriousBroker.it  This test is not yet approved or cleared by the [place] FDA and has been authorized for detection and/or diagnosis of SARS-CoV-2 by FDA under an Emergency Use Authorization (EUA). This EUA will remain in effect (meaning this test can be used) for the duration of the COVID-19 declaration under Section 564(b)(1) of the Act, 21 U.S.C. section 360bbb-3(b)(1), unless the authorization is terminated or revoked.  Performed at  [facility], [address], [place], [place] [address]   C Difficile Quick Screen w PCR reflex     Status: Abnormal   Collection Time: 12/26/20 11:18 AM   Specimen: STOOL  Result Value Ref Range Status   C Diff antigen POSITIVE (A) NEGATIVE Final   C Diff toxin NEGATIVE NEGATIVE Final   C Diff interpretation Results are indeterminate. See PCR results.  Final    Comment: Performed at [facility], [address] [place], [place] [address]  C. Diff by PCR, Reflexed     Status: None   Collection Time: 12/26/20 11:18 AM  Result Value Ref Range Status   Toxigenic C. Difficile by PCR NEGATIVE NEGATIVE Final    Comment: Patient is colonized with non toxigenic C. difficile. May not need treatment unless significant symptoms are present. Performed at [facility], [address] [place], [place] [address]          Radiology Studies: CT ANGIO GI BLEED  Result Date: 12/26/2020 CLINICAL DATA:  AFib on Eliquis.  Dark red blood per rectum. EXAM: CTA ABDOMEN AND PELVIS WITHOUT AND WITH CONTRAST TECHNIQUE: Multidetector CT imaging of the abdomen and pelvis was performed using the standard protocol during bolus administration of intravenous contrast. Multiplanar reconstructed images and MIPs were obtained and reviewed to evaluate the vascular anatomy. CONTRAST:  OMNIPAQUE IOHEXOL 350 MG/ML SOLN COMPARISON:  CT  abdomen and pelvis 01/31/2020 FINDINGS: VASCULAR Aorta: Normal caliber aorta without aneurysm, dissection, vasculitis or significant stenosis. There is calcified atherosclerotic disease in the aorta. 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 Branches appear patent. No significant stenosis. SMA: Patent without evidence of aneurysm, dissection, vasculitis or significant stenosis. 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 Renal arteries are otherwise within normal limits. IMA: Patent without evidence of aneurysm, dissection, vasculitis or significant stenosis. Inflow: Patent without evidence of aneurysm, dissection, vasculitis or significant stenosis. Proximal Outflow: Bilateral common femoral and visualized portions of the superficial and profunda femoral arteries are patent without evidence of aneurysm, dissection, vasculitis or significant stenosis. Veins: Infrarenal IVC filter in place which appears unchanged in  size. On arterial phase imaging there is some vascularity seen posterior to the IVC at the level of the filter with some questionable arterial feeder vessel. There is some contrast opacification in the IVC at this level. Findings are unchanged from the prior examination. No thrombus identified. Review of the MIP images confirms the above findings. NON-VASCULAR Lower chest: There is atelectasis in the lung bases. The heart is enlarged Hepatobiliary: There is a hypodensity in the right lobe of the liver which is too small to characterize, likely a cyst or hemangioma. Gallbladder surgically absent. Pancreas: Unremarkable. No pancreatic ductal dilatation or surrounding inflammatory changes. Spleen: Normal in size without focal abnormality. Adrenals/Urinary Tract: There is a 2 cm right renal cyst. There is a 4.1 cm left renal cyst. There are additional hypodensities in both kidneys which are too small to  characterize. There is no hydronephrosis or perinephric fluid. Bladder is distended. Numerous bladder diverticula are again seen. Bladder wall trabeculation is again noted. Adrenal glands are within normal limits. Stomach/Bowel: There are no areas of active extravasation of contrast on arterial or portal venous phase imaging within the bowel. There is no evidence for bowel obstruction or free air. There is diffuse colonic diverticulosis. There is mild inflammatory stranding surrounding diverticula in the distal descending colon compatible with acute diverticulitis. There is no evidence for perforation or abscess. Lymphatic: Aortic atherosclerosis. No enlarged abdominal or pelvic lymph nodes. Reproductive: Prostate gland is mildly enlarged, unchanged. Other: There is no free fluid. There is a small fat containing umbilical hernia. Musculoskeletal: There are healed right-sided rib fractures. Multilevel degenerative changes affect the spine. There are degenerative changes of the right hip. Left hip arthroplasty is present. IMPRESSION: VASCULAR 1. No evidence for active gastrointestinal bleeding. 2. IVC filter in place. There is vascularity posterior to the IVC at the level of the filter with questionable arterial feeder vessels and contrast opacification of the IVC on arterial phase imaging. Findings are suspicious for AV malformation or fistula at this level. 3. 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 4. 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 NON-VASCULAR 1. Acute uncomplicated descending colon diverticulitis. 2. Stable trabeculated bladder wall with multiple bladder diverticula suggesting chronic bladder outlet obstruction. Electronically Signed   By: Darliss Cheney M.D.   On: 12/26/2020 21:41        Scheduled Meds:  amoxicillin-clavulanate  1 tablet Oral Q12H   atorvastatin  40 mg Oral Daily   metoprolol tartrate  12.5 mg Oral BID   pantoprazole  40 mg Oral BID AC   tamsulosin   0.4 mg Oral QPC supper   Continuous Infusions:  lactated ringers 75 mL/hr at 12/26/20 0647     LOS: 0 days   Time spent= 35 mins    Deshon Hsiao Joline Maxcy, MD [facility]  If 7PM-7AM, please contact night-coverage  12/27/2020, 10:52 AM

## 2020-12-27 NOTE — Plan of Care (Signed)
  Problem: Education: Goal: Knowledge of General Education information will improve Description: Including pain rating scale, medication(s)/side effects and non-pharmacologic comfort measures Outcome: Not Progressing   Problem: Health Behavior/Discharge Planning: Goal: Ability to manage health-related needs will improve Outcome: Not Progressing   Problem: Clinical Measurements: Goal: Ability to maintain clinical measurements within normal limits will improve Outcome: Not Progressing    Pt has advance dementia.

## 2020-12-28 ENCOUNTER — Other Ambulatory Visit: Payer: Self-pay

## 2020-12-28 DIAGNOSIS — I10 Essential (primary) hypertension: Secondary | ICD-10-CM | POA: Diagnosis not present

## 2020-12-28 DIAGNOSIS — K5791 Diverticulosis of intestine, part unspecified, without perforation or abscess with bleeding: Secondary | ICD-10-CM | POA: Diagnosis not present

## 2020-12-28 DIAGNOSIS — K2971 Gastritis, unspecified, with bleeding: Secondary | ICD-10-CM

## 2020-12-28 DIAGNOSIS — I480 Paroxysmal atrial fibrillation: Secondary | ICD-10-CM | POA: Diagnosis not present

## 2020-12-28 DIAGNOSIS — G309 Alzheimer's disease, unspecified: Secondary | ICD-10-CM | POA: Diagnosis not present

## 2020-12-28 DIAGNOSIS — K5732 Diverticulitis of large intestine without perforation or abscess without bleeding: Secondary | ICD-10-CM | POA: Diagnosis not present

## 2020-12-28 LAB — BASIC METABOLIC PANEL
Anion gap: 3 — ABNORMAL LOW (ref 5–15)
BUN: 16 mg/dL (ref 8–23)
CO2: 27 mmol/L (ref 22–32)
Calcium: 8.2 mg/dL — ABNORMAL LOW (ref 8.9–10.3)
Chloride: 109 mmol/L (ref 98–111)
Creatinine, Ser: 1.21 mg/dL (ref 0.61–1.24)
GFR, Estimated: 59 mL/min — ABNORMAL LOW (ref >60)
Glucose, Bld: 102 mg/dL — ABNORMAL HIGH (ref 70–99)
Potassium: 3.5 mmol/L (ref 3.5–5.1)
Sodium: 139 mmol/L (ref 135–145)

## 2020-12-28 LAB — CBC
HCT: 32.4 % — ABNORMAL LOW (ref 39.0–52.0)
Hemoglobin: 10.8 g/dL — ABNORMAL LOW (ref 13.0–17.0)
MCH: 28.7 pg (ref 26.0–34.0)
MCHC: 33.3 g/dL (ref 30.0–36.0)
MCV: 86.2 fL (ref 80.0–100.0)
Platelets: 129 10*3/uL — ABNORMAL LOW (ref 150–400)
RBC: 3.76 MIL/uL — ABNORMAL LOW (ref 4.22–5.81)
RDW: 14.5 % (ref 11.5–15.5)
WBC: 5.7 10*3/uL (ref 4.0–10.5)
nRBC: 0 % (ref 0.0–0.2)

## 2020-12-28 LAB — HEMOGLOBIN AND HEMATOCRIT, BLOOD
HCT: 33.4 % — ABNORMAL LOW (ref 39.0–52.0)
Hemoglobin: 11 g/dL — ABNORMAL LOW (ref 13.0–17.0)

## 2020-12-28 LAB — MAGNESIUM: Magnesium: 1.9 mg/dL (ref 1.7–2.4)

## 2020-12-28 MED ORDER — PSYLLIUM 95 % PO PACK
1.0000 | PACK | Freq: Every day | ORAL | Status: DC
  Administered 2020-12-28 – 2020-12-31 (×4): 1 via ORAL
  Filled 2020-12-28 (×4): qty 1

## 2020-12-28 NOTE — Plan of Care (Signed)
  Problem: Education: Goal: Knowledge of General Education information will improve Description: Including pain rating scale, medication(s)/side effects and non-pharmacologic comfort measures Outcome: Progressing   Problem: Clinical Measurements: Goal: Respiratory complications will improve Outcome: Progressing   

## 2020-12-28 NOTE — Progress Notes (Signed)
[facility] ROUNDING NOTE   Subjective: No acute events overnight.  Patient's hemoglobin drifted slightly, and he continues to have maroon colored blood in his FMS.  Hemodynamically stable, tolerating regular diet, afebrile   Objective: Vital signs in last 24 hours: Temp:  [97.6 F (36.4 C)-98.4 F (36.9 C)] 97.6 F (36.4 C) (10/28 0748) Pulse Rate:  [57-97] 96 (10/28 0748) Resp:  [14-20] 14 (10/28 0748) BP: (83-156)/(54-104) 156/104 (10/28 0748) SpO2:  [93 %-96 %] 93 % (10/28 0748) Last BM Date: 12/27/20 General: NAD, pleasant demented male, accompanied by daughter Abdomen:  Soft, NT, ND, +BS FMS with small amount of thin maroon colored blood    Intake/Output from previous day: 10/27 0701 - 10/28 0700 In: 60  Out: 1000 [Urine:900; Stool:100] Intake/Output this shift: Total I/O In: -  Out: 350 [Urine:350]   Lab Results: Recent Labs    12/25/20 1820 12/26/20 0313 12/27/20 0349 12/27/20 0926 12/27/20 2005 12/28/20 0320 12/28/20 0804  WBC 7.6  --  9.8  --   --  5.7  --   HGB 13.4   < > 11.7*   < > 11.4* 10.8* 11.0*  PLT 152  --  140*  --   --  129*  --   MCV 87.9  --  85.9  --   --  86.2  --    < > = values in this interval not displayed.   BMET Recent Labs    12/25/20 1820 12/27/20 0349 12/28/20 0320  NA 139 136 139  K 4.1 3.7 3.5  CL 104 106 109  CO2 27 25 27   GLUCOSE 123* 96 102*  BUN 35* 19 16  CREATININE 1.17 0.98 1.21  CALCIUM 9.0 8.2* 8.2*   LFT Recent Labs    12/25/20 1820  PROT 6.5  ALBUMIN 3.4*  AST 18  ALT 20  ALKPHOS 71  BILITOT 1.0   PT/INR Recent Labs    12/25/20 1820  INR 1.3*      Imaging/Other results: CT ANGIO GI BLEED  Result Date: 12/26/2020 CLINICAL DATA:  AFib on Eliquis.  Dark red blood per rectum. EXAM: CTA ABDOMEN AND PELVIS WITHOUT AND WITH CONTRAST TECHNIQUE: Multidetector CT imaging of the abdomen and pelvis was performed using the standard protocol during bolus administration of intravenous  contrast. Multiplanar reconstructed images and MIPs were obtained and reviewed to evaluate the vascular anatomy. CONTRAST:  12/28/2020 OMNIPAQUE IOHEXOL 350 MG/ML SOLN COMPARISON:  CT abdomen and pelvis 01/31/2020 FINDINGS: VASCULAR Aorta: Normal caliber aorta without aneurysm, dissection, vasculitis or significant stenosis. There is calcified atherosclerotic disease in the aorta. 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 Branches appear patent. No significant stenosis. SMA: Patent without evidence of aneurysm, dissection, vasculitis or significant stenosis. 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 Renal arteries are otherwise within normal limits. IMA: Patent without evidence of aneurysm, dissection, vasculitis or significant stenosis. Inflow: Patent without evidence of aneurysm, dissection, vasculitis or significant stenosis. Proximal Outflow: Bilateral common femoral and visualized portions of the superficial and profunda femoral arteries are patent without evidence of aneurysm, dissection, vasculitis or significant stenosis. Veins: Infrarenal IVC filter in place which appears unchanged in size. On arterial phase imaging there is some vascularity seen posterior to the IVC at the level of the filter with some questionable arterial feeder vessel. There is some contrast opacification in the IVC at this level. Findings  are unchanged from the prior examination. No thrombus identified. Review of the MIP images confirms the above findings. NON-VASCULAR Lower chest: There is atelectasis in the lung bases. The heart is enlarged Hepatobiliary: There is a hypodensity in the right lobe of the liver which is too small to characterize, likely a cyst or hemangioma. Gallbladder surgically absent. Pancreas: Unremarkable. No pancreatic ductal dilatation or surrounding inflammatory changes. Spleen: Normal in size without focal  abnormality. Adrenals/Urinary Tract: There is a 2 cm right renal cyst. There is a 4.1 cm left renal cyst. There are additional hypodensities in both kidneys which are too small to characterize. There is no hydronephrosis or perinephric fluid. Bladder is distended. Numerous bladder diverticula are again seen. Bladder wall trabeculation is again noted. Adrenal glands are within normal limits. Stomach/Bowel: There are no areas of active extravasation of contrast on arterial or portal venous phase imaging within the bowel. There is no evidence for bowel obstruction or free air. There is diffuse colonic diverticulosis. There is mild inflammatory stranding surrounding diverticula in the distal descending colon compatible with acute diverticulitis. There is no evidence for perforation or abscess. Lymphatic: Aortic atherosclerosis. No enlarged abdominal or pelvic lymph nodes. Reproductive: Prostate gland is mildly enlarged, unchanged. Other: There is no free fluid. There is a small fat containing umbilical hernia. Musculoskeletal: There are healed right-sided rib fractures. Multilevel degenerative changes affect the spine. There are degenerative changes of the right hip. Left hip arthroplasty is present. IMPRESSION: VASCULAR 1. No evidence for active gastrointestinal bleeding. 2. IVC filter in place. There is vascularity posterior to the IVC at the level of the filter with questionable arterial feeder vessels and contrast opacification of the IVC on arterial phase imaging. Findings are suspicious for AV malformation or fistula at this level. 3. 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 4. 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 NON-VASCULAR 1. Acute uncomplicated descending colon diverticulitis. 2. Stable trabeculated bladder wall with multiple bladder diverticula suggesting chronic bladder outlet obstruction. Electronically Signed   By: Darliss Cheney M.D.   On: 12/26/2020 21:41      Assessment  and Plan:  86 year old male with Alzheimer's and atrial fibrillation on Eliquis, presented with profuse bloody bowel movements x 1 day, consistent with a diverticular bleed.  His hemoglobin has drifted slowly from 13 to 11. Hemodynamically stable with elevated blood pressures.  CT-A 10/26 with no active bleeding, but notable for mild uncomplicated diverticulitis.     GI bleed, presumed diverticular - Not briskly bleeding, but does not appear to have resolved completely - Continue to monitor, no plans for endoscopic examination currently given his dementia, difficulty in achieving, and absence of serious bleeding - Check CBC q24hrs - Would prefer to see normal appearing stool before discharge home - Continue to hold anticoagulation.  Follow up with PCM to readdress risks/benefits of anticoagulation   Left sided diverticulitis, uncomplicated - Minimal TTP, afebrile, normal WBC - Reasonable to treat with short course of Augmentin given his age and comorbidities - Advance diet to soft as mentioned above  Constipation - Patient chronic constipation but suffers from liquid stools with incontinence at home, suggestive of overflow incontinence - We discussed trying a home bowel regimen of metamucil every day with senna 8.6 mg every 2 or 3 days without a formed bowel movement   Jenel Lucks, MD 12/28/2020, 9:03 AM [facility]

## 2020-12-28 NOTE — Progress Notes (Signed)
PROGRESS NOTE    Ryan [name]  FYB:[id number] DOB: 30-Apr-1934 DOA: 12/25/2020 PCP: Lajean Manes, MD   Brief Narrative:  86 year old with history of Alzheimer's, atrial fibrillation on Eliquis comes to the hospital with profuse bloody bowel movement.  Got diagnosed with likely diverticular bleed but due to persistent bleeding he had CTA done which did not show any active bleeding but did show uncomplicated mild diverticulitis of descending colon.  He was started on Augmentin.  His Eliquis was held.  Patient was seen by GI team as well.  His diet was slowly advanced.   Assessment & Plan:   Principal Problem:   GI bleeding Active Problems:   AF (paroxysmal atrial fibrillation) (HCC)   GI bleed   Primary hypertension   Alzheimer disease (HCC)   Prolonged QT interval   Lower GI bleeding, likely diverticular -Bleeding appears to have slowed down but still having slightly dark stools.  Hemoglobin this morning is 11.0.   Baseline is 13.5.  Eliquis is on hold.  CTA is negative for any active bleeding but does show uncomplicated diverticulitis.  Continue to monitor hemoglobin.  Appreciate GI input.  Constipation with overflow incontinence - Bowel regimen prescribed  Uncomplicated diverticulitis of descending colon - Overall asymptomatic but will treated with 7 days of oral Augmentin.  Last day 11/2.  Diet as tolerated.  History of paroxysmal atrial fibrillation - Continue metoprolol but will continue to hold Eliquis.  Patient and family/daughters have been advised to discuss long-term anticoagulation with the PCP and they understand.  In light of GI bleeding, Eliquis should be held for at least next 5 days after bleeding is subsided before considering resuming it  Essential hypertension - Resume home meds  Alzheimer's dementia - Stable     DVT prophylaxis: SCDs Code Status: Full code Family Communication: Met with daughter at bedside   Nutritional status            Body mass index is 25.94 kg/m.  Pressure Injury 01/31/20 Leg Left 2 full thickness wounds to left upper thigh (Active)  01/31/20 0625  Location: Leg  Location Orientation: Left  Staging:   Wound Description (Comments): 2 full thickness wounds to left upper thigh  Present on Admission: Yes          Subjective: still having slightly maroon-colored stool but improved from before.  Daughter and patient also reporting of watery stool but occasionally suffers from constipation.  Review of Systems Otherwise negative except as per HPI, including: General: Denies fever, chills, night sweats or unintended weight loss. Resp: Denies cough, wheezing, shortness of breath. Cardiac: Denies chest pain, palpitations, orthopnea, paroxysmal nocturnal dyspnea. GI: Denies abdominal pain, nausea, vomiting, diarrhea or constipation GU: Denies dysuria, frequency, hesitancy or incontinence MS: Denies muscle aches, joint pain or swelling Neuro: Denies headache, neurologic deficits (focal weakness, numbness, tingling), abnormal gait Psych: Denies anxiety, depression, SI/HI/AVH Skin: Denies new rashes or lesions ID: Denies sick contacts, exotic exposures, travel  Examination:  Constitutional: Not in acute distress.  Elderly frail Respiratory: Clear to auscultation bilaterally Cardiovascular: Normal sinus rhythm, no rubs Abdomen: Nontender nondistended good bowel sounds Musculoskeletal: No edema noted Skin: No rashes seen Neurologic: CN 2-12 grossly intact.  And nonfocal Psychiatric: Poor judgment and insight.  Alert to name  Objective: Vitals:   12/27/20 2000 12/27/20 2300 12/28/20 0400 12/28/20 0748  BP: (!) 142/89 138/79 (!) 144/93 (!) 156/104  Pulse: 89 85 90 96  Resp: _0 Temp: 97.8 F (36.6 C) 97.8  F (36.6 C) 98 F (36.7 C) 97.6 F (36.4 C)  TempSrc: Axillary Axillary Axillary Axillary  SpO2: 95% 95% 96% 93%  Weight:      Height:        Intake/Output Summary (Last  24 hours) at 12/28/2020 1110 Last data filed at 12/28/2020 0750 Gross per 24 hour  Intake 60 ml  Output 1350 ml  Net -1290 ml   Filed Weights   12/26/20 2335  Weight: 82 kg     Data Reviewed:   CBC: Recent Labs  Lab 12/25/20 1820 12/26/20 0313 12/27/20 0349 12/27/20 0926 12/27/20 1451 12/27/20 2005 12/28/20 0320 12/28/20 0804  WBC 7.6  --  9.8  --   --   --  5.7  --   HGB 13.4   < > 11.7* 11.1* 10.9* 11.4* 10.8* 11.0*  HCT 41.4   < > 34.7* 33.6* 33.3* 35.4* 32.4* 33.4*  MCV 87.9  --  85.9  --   --   --  86.2  --   PLT 152  --  140*  --   --   --  129*  --    < > = values in this interval not displayed.   Basic Metabolic Panel: Recent Labs  Lab 12/25/20 1820 12/27/20 0349 12/28/20 0320  NA 139 136 139  K 4.1 3.7 3.5  CL 104 106 109  CO2 _0 GLUCOSE 123* 96 102*  BUN 35* 19 16  CREATININE 1.17 0.98 1.21  CALCIUM 9.0 8.2* 8.2*  MG  --  1.8 1.9   GFR: Estimated Creatinine Clearance: 46.9 mL/min (by C-G formula based on SCr of 1.21 mg/dL). Liver Function Tests: Recent Labs  Lab 12/25/20 1820  AST 18  ALT 20  ALKPHOS 71  BILITOT 1.0  PROT 6.5  ALBUMIN 3.4*   No results for input(s): LIPASE, AMYLASE in the last 168 hours. No results for input(s): AMMONIA in the last 168 hours. Coagulation Profile: Recent Labs  Lab 12/25/20 1820  INR 1.3*   Cardiac Enzymes: No results for input(s): CKTOTAL, CKMB, CKMBINDEX, TROPONINI in the last 168 hours. BNP (last 3 results) No results for input(s): PROBNP in the last 8760 hours. HbA1C: No results for input(s): HGBA1C in the last 72 hours. CBG: No results for input(s): GLUCAP in the last 168 hours. Lipid Profile: No results for input(s): CHOL, HDL, LDLCALC, TRIG, CHOLHDL, LDLDIRECT in the last 72 hours. Thyroid Function Tests: No results for input(s): TSH, T4TOTAL, FREET4, T3FREE, THYROIDAB in the last 72 hours. Anemia Panel: No results for input(s): VITAMINB12, FOLATE, FERRITIN, TIBC, IRON,  RETICCTPCT in the last 72 hours. Sepsis Labs: No results for input(s): PROCALCITON, LATICACIDVEN in the last 168 hours.  Recent Results (from the past 240 hour(s))  Resp Panel by RT-PCR (Flu A&B, Covid) Nasopharyngeal Swab     Status: None   Collection Time: 12/26/20  3:12 AM   Specimen: Nasopharyngeal Swab; Nasopharyngeal(NP) swabs in vial transport medium  Result Value Ref Range Status   SARS Coronavirus 2 by RT PCR NEGATIVE NEGATIVE Final    Comment: (NOTE) SARS-CoV-2 target nucleic acids are NOT DETECTED.  The SARS-CoV-2 RNA is generally detectable in upper respiratory specimens during the acute phase of infection. The lowest concentration of SARS-CoV-2 viral copies this assay can detect is 138 copies/mL. A negative result does not preclude SARS-Cov-2 infection and should not be used as the sole basis for treatment or other patient management decisions. A negative result may occur with  improper  specimen collection/handling, submission of specimen other than nasopharyngeal swab, presence of viral mutation(s) within the areas targeted by this assay, and inadequate number of viral copies(<138 copies/mL). A negative result must be combined with clinical observations, patient history, and epidemiological information. The expected result is Negative.  Fact Sheet for Patients:  EntrepreneurPulse.com.au  Fact Sheet for Healthcare Providers:  IncredibleEmployment.be  This test is no t yet approved or cleared by the [place] FDA and  has been authorized for detection and/or diagnosis of SARS-CoV-2 by FDA under an Emergency Use Authorization (EUA). This EUA will remain  in effect (meaning this test can be used) for the duration of the COVID-19 declaration under Section 564(b)(1) of the Act, 21 U.S.C.section 360bbb-3(b)(1), unless the authorization is terminated  or revoked sooner.       Influenza A by PCR NEGATIVE NEGATIVE Final   Influenza  B by PCR NEGATIVE NEGATIVE Final    Comment: (NOTE) The Xpert Xpress SARS-CoV-2/FLU/RSV plus assay is intended as an aid in the diagnosis of influenza from Nasopharyngeal swab specimens and should not be used as a sole basis for treatment. Nasal washings and aspirates are unacceptable for Xpert Xpress SARS-CoV-2/FLU/RSV testing.  Fact Sheet for Patients: EntrepreneurPulse.com.au  Fact Sheet for Healthcare Providers: IncredibleEmployment.be  This test is not yet approved or cleared by the [place] FDA and has been authorized for detection and/or diagnosis of SARS-CoV-2 by FDA under an Emergency Use Authorization (EUA). This EUA will remain in effect (meaning this test can be used) for the duration of the COVID-19 declaration under Section 564(b)(1) of the Act, 21 U.S.C. section 360bbb-3(b)(1), unless the authorization is terminated or revoked.  Performed at [facility], [place] [address] [place], [place] [address]   C Difficile Quick Screen w PCR reflex     Status: Abnormal   Collection Time: 12/26/20 11:18 AM   Specimen: STOOL  Result Value Ref Range Status   C Diff antigen POSITIVE (A) NEGATIVE Final   C Diff toxin NEGATIVE NEGATIVE Final   C Diff interpretation Results are indeterminate. See PCR results.  Final    Comment: Performed at [facility], [place] [address] [place], [place] [address]  C. Diff by PCR, Reflexed     Status: None   Collection Time: 12/26/20 11:18 AM  Result Value Ref Range Status   Toxigenic C. Difficile by PCR NEGATIVE NEGATIVE Final    Comment: Patient is colonized with non toxigenic C. difficile. May not need treatment unless significant symptoms are present. Performed at [facility], [place] [address] [place], [place] [address]          Radiology Studies: CT ANGIO GI BLEED  Result Date: 12/26/2020 CLINICAL DATA:  AFib on Eliquis.  Dark red blood per rectum. EXAM: CTA ABDOMEN AND PELVIS  WITHOUT AND WITH CONTRAST TECHNIQUE: Multidetector CT imaging of the abdomen and pelvis was performed using the standard protocol during bolus administration of intravenous contrast. Multiplanar reconstructed images and MIPs were obtained and reviewed to evaluate the vascular anatomy. CONTRAST:  147m OMNIPAQUE IOHEXOL 350 MG/ML SOLN COMPARISON:  CT abdomen and pelvis 01/31/2020 FINDINGS: VASCULAR Aorta: Normal caliber aorta without aneurysm, dissection, vasculitis or significant stenosis. There is calcified atherosclerotic disease in the aorta. 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 Branches appear patent. No significant stenosis. SMA: Patent without evidence of aneurysm, dissection, vasculitis or significant stenosis. Renals: There is a 7 by  8 mm saccular left renal artery aneurysm which is unchanged. Renal arteries are otherwise within normal limits. IMA: Patent without evidence of aneurysm, dissection, vasculitis or significant stenosis. Inflow: Patent without evidence of aneurysm, dissection, vasculitis or significant stenosis. Proximal Outflow: Bilateral common femoral and visualized portions of the superficial and profunda femoral arteries are patent without evidence of aneurysm, dissection, vasculitis or significant stenosis. Veins: Infrarenal IVC filter in place which appears unchanged in size. On arterial phase imaging there is some vascularity seen posterior to the IVC at the level of the filter with some questionable arterial feeder vessel. There is some contrast opacification in the IVC at this level. Findings are unchanged from the prior examination. No thrombus identified. Review of the MIP images confirms the above findings. NON-VASCULAR Lower chest: There is atelectasis in the lung bases. The heart is enlarged Hepatobiliary: There is a hypodensity in the right lobe of the liver which is too small to characterize, likely a cyst or  hemangioma. Gallbladder surgically absent. Pancreas: Unremarkable. No pancreatic ductal dilatation or surrounding inflammatory changes. Spleen: Normal in size without focal abnormality. Adrenals/Urinary Tract: There is a 2 cm right renal cyst. There is a 4.1 cm left renal cyst. There are additional hypodensities in both kidneys which are too small to characterize. There is no hydronephrosis or perinephric fluid. Bladder is distended. Numerous bladder diverticula are again seen. Bladder wall trabeculation is again noted. Adrenal glands are within normal limits. Stomach/Bowel: There are no areas of active extravasation of contrast on arterial or portal venous phase imaging within the bowel. There is no evidence for bowel obstruction or free air. There is diffuse colonic diverticulosis. There is mild inflammatory stranding surrounding diverticula in the distal descending colon compatible with acute diverticulitis. There is no evidence for perforation or abscess. Lymphatic: Aortic atherosclerosis. No enlarged abdominal or pelvic lymph nodes. Reproductive: Prostate gland is mildly enlarged, unchanged. Other: There is no free fluid. There is a small fat containing umbilical hernia. Musculoskeletal: There are healed right-sided rib fractures. Multilevel degenerative changes affect the spine. There are degenerative changes of the right hip. Left hip arthroplasty is present. IMPRESSION: VASCULAR 1. No evidence for active gastrointestinal bleeding. 2. IVC filter in place. There is vascularity posterior to the IVC at the level of the filter with questionable arterial feeder vessels and contrast opacification of the IVC on arterial phase imaging. Findings are suspicious for AV malformation or fistula at this level. 3. 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 4. 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 NON-VASCULAR 1. Acute uncomplicated descending colon diverticulitis. 2. Stable trabeculated bladder wall  with multiple bladder diverticula suggesting chronic bladder outlet obstruction. Electronically Signed   By: Ronney Asters M.D.   On: 12/26/2020 21:41        Scheduled Meds:  amoxicillin-clavulanate  1 tablet Oral Q12H   atorvastatin  40 mg Oral Daily   metoprolol tartrate  12.5 mg Oral BID   pantoprazole  40 mg Oral BID AC   psyllium  1 packet Oral Daily   tamsulosin  0.4 mg Oral QPC supper   Continuous Infusions:  lactated ringers 75 mL/hr at 12/26/20 0647     LOS: 1 day   Time spent= 35 mins    Rubina Basinski Arsenio Loader, MD [facility]  If 7PM-7AM, please contact night-coverage  12/28/2020, 11:10 AM

## 2020-12-29 DIAGNOSIS — K2971 Gastritis, unspecified, with bleeding: Secondary | ICD-10-CM | POA: Diagnosis not present

## 2020-12-29 DIAGNOSIS — R9431 Abnormal electrocardiogram [ECG] [EKG]: Secondary | ICD-10-CM

## 2020-12-29 DIAGNOSIS — I480 Paroxysmal atrial fibrillation: Secondary | ICD-10-CM | POA: Diagnosis not present

## 2020-12-29 DIAGNOSIS — K5791 Diverticulosis of intestine, part unspecified, without perforation or abscess with bleeding: Secondary | ICD-10-CM | POA: Diagnosis not present

## 2020-12-29 DIAGNOSIS — G309 Alzheimer's disease, unspecified: Secondary | ICD-10-CM | POA: Diagnosis not present

## 2020-12-29 LAB — CBC
HCT: 30.6 % — ABNORMAL LOW (ref 39.0–52.0)
Hemoglobin: 10.1 g/dL — ABNORMAL LOW (ref 13.0–17.0)
MCH: 28.9 pg (ref 26.0–34.0)
MCHC: 33 g/dL (ref 30.0–36.0)
MCV: 87.4 fL (ref 80.0–100.0)
Platelets: 129 10*3/uL — ABNORMAL LOW (ref 150–400)
RBC: 3.5 MIL/uL — ABNORMAL LOW (ref 4.22–5.81)
RDW: 14.6 % (ref 11.5–15.5)
WBC: 5.6 10*3/uL (ref 4.0–10.5)
nRBC: 0 % (ref 0.0–0.2)

## 2020-12-29 LAB — BASIC METABOLIC PANEL
Anion gap: 4 — ABNORMAL LOW (ref 5–15)
BUN: 24 mg/dL — ABNORMAL HIGH (ref 8–23)
CO2: 27 mmol/L (ref 22–32)
Calcium: 8.1 mg/dL — ABNORMAL LOW (ref 8.9–10.3)
Chloride: 108 mmol/L (ref 98–111)
Creatinine, Ser: 1.21 mg/dL (ref 0.61–1.24)
GFR, Estimated: 59 mL/min — ABNORMAL LOW (ref >60)
Glucose, Bld: 107 mg/dL — ABNORMAL HIGH (ref 70–99)
Potassium: 3.8 mmol/L (ref 3.5–5.1)
Sodium: 139 mmol/L (ref 135–145)

## 2020-12-29 LAB — MAGNESIUM: Magnesium: 1.8 mg/dL (ref 1.7–2.4)

## 2020-12-29 NOTE — Progress Notes (Signed)
PROGRESS NOTE    Ryan [name]  BSJ:[id number] DOB: 09-27-1934 DOA: 12/25/2020 PCP: Lajean Manes, MD   Brief Narrative:  86 year old with history of Alzheimer's, atrial fibrillation on Eliquis comes to the hospital with profuse bloody bowel movement.  Got diagnosed with likely diverticular bleed but due to persistent bleeding he had CTA done which did not show any active bleeding but did show uncomplicated mild diverticulitis of descending colon.  He was started on Augmentin.  His Eliquis was held.  Patient was seen by GI team as well.  His diet was slowly advanced.   Assessment & Plan:   Principal Problem:   GI bleeding Active Problems:   AF (paroxysmal atrial fibrillation) (HCC)   GI bleed   Primary hypertension   Alzheimer disease (HCC)   Prolonged QT interval   Lower GI bleeding, likely diverticular -Bleeding  appears to have slowed down.  Hemoglobin this morning is 10.1.   Baseline is 13.5.  Eliquis still on hold.  CTA is negative for any active bleeding but does show uncomplicated diverticulitis.  Continue to monitor hemoglobin. GI Following.   Constipation with overflow incontinence - Bowel regimen prn, metamucil added.   Uncomplicated diverticulitis of descending colon - Overall asymptomatic but will treated with 7 days of oral Augmentin.  Last day 11/2.  Diet as tolerated.   History of paroxysmal atrial fibrillation - Continue metoprolol but will continue to hold Eliquis.  Patient and family/daughters have been advised to discuss long-term anticoagulation with the PCP and they understand.  In light of GI bleeding, Eliquis should be held for at least next 5 days after bleeding is subsided before considering resuming it  Essential hypertension - Resume home meds  Alzheimer's dementia - Stable   Will get PT/OT.   DVT prophylaxis: SCDs Code Status: Full code Family Communication: Met with his daughter at bedside.   Nutritional status           Body mass  index is 25.94 kg/m.  Pressure Injury 01/31/20 Leg Left 2 full thickness wounds to left upper thigh (Active)  01/31/20 0625  Location: Leg  Location Orientation: Left  Staging:   Wound Description (Comments): 2 full thickness wounds to left upper thigh  Present on Admission: Yes          Subjective: Still having mild dark/maroon stool but seems to be improving.   Review of Systems Otherwise negative except as per HPI, including: General = no fevers, chills, dizziness,  fatigue HEENT/EYES = negative for loss of vision, double vision, blurred vision,  sore throa Cardiovascular= negative for chest pain, palpitation Respiratory/lungs= negative for shortness of breath, cough, wheezing; hemoptysis,  Gastrointestinal= negative for nausea, vomiting, abdominal pain Genitourinary= negative for Dysuria MSK = Negative for arthralgia, myalgias Neurology= Negative for headache, numbness, tingling  Psychiatry= Negative for suicidal and homocidal ideation Skin= Negative for Rash   Examination:  Constitutional: Not in acute distress; Elderly frail.  Respiratory: Clear to auscultation bilaterally Cardiovascular: Normal sinus rhythm, no rubs Abdomen: Nontender nondistended good bowel sounds Musculoskeletal: No edema noted Skin: No rashes seen Neurologic: CN 2-12 grossly intact.  And nonfocal Psychiatric: Poorjudgment and insight. Alert and oriented x to name and place. Baseline.    Objective: Vitals:   12/28/20 1942 12/29/20 0028 12/29/20 0338 12/29/20 0805  BP: (!) 149/97 (!) 142/100  (!) 152/91  Pulse:  86  88  Resp:  20  19  Temp: (!) 97.5 F (36.4 C) 97.6 F (36.4 C) 98 F (36.7 C) 97.7  F (36.5 C)  TempSrc: Oral Oral Oral Oral  SpO2:  97%  95%  Weight:      Height:       No intake or output data in the 24 hours ending 12/29/20 1052  Filed Weights   12/26/20 2335  Weight: 82 kg     Data Reviewed:   CBC: Recent Labs  Lab 12/25/20 1820 12/26/20 0313  12/27/20 0349 12/27/20 0926 12/27/20 1451 12/27/20 2005 12/28/20 0320 12/28/20 0804 12/29/20 0123  WBC 7.6  --  9.8  --   --   --  5.7  --  5.6  HGB 13.4   < > 11.7*   < > 10.9* 11.4* 10.8* 11.0* 10.1*  HCT 41.4   < > 34.7*   < > 33.3* 35.4* 32.4* 33.4* 30.6*  MCV 87.9  --  85.9  --   --   --  86.2  --  87.4  PLT 152  --  140*  --   --   --  129*  --  129*   < > = values in this interval not displayed.   Basic Metabolic Panel: Recent Labs  Lab 12/25/20 1820 12/27/20 0349 12/28/20 0320 12/29/20 0123  NA 139 136 139 139  K 4.1 3.7 3.5 3.8  CL 104 106 109 108  CO2 $Re'27 25 27 27  'Rji$ GLUCOSE 123* 96 102* 107*  BUN 35* 19 16 24*  CREATININE 1.17 0.98 1.21 1.21  CALCIUM 9.0 8.2* 8.2* 8.1*  MG  --  1.8 1.9 1.8   GFR: Estimated Creatinine Clearance: 46.9 mL/min (by C-G formula based on SCr of 1.21 mg/dL). Liver Function Tests: Recent Labs  Lab 12/25/20 1820  AST 18  ALT 20  ALKPHOS 71  BILITOT 1.0  PROT 6.5  ALBUMIN 3.4*   No results for input(s): LIPASE, AMYLASE in the last 168 hours. No results for input(s): AMMONIA in the last 168 hours. Coagulation Profile: Recent Labs  Lab 12/25/20 1820  INR 1.3*   Cardiac Enzymes: No results for input(s): CKTOTAL, CKMB, CKMBINDEX, TROPONINI in the last 168 hours. BNP (last 3 results) No results for input(s): PROBNP in the last 8760 hours. HbA1C: No results for input(s): HGBA1C in the last 72 hours. CBG: No results for input(s): GLUCAP in the last 168 hours. Lipid Profile: No results for input(s): CHOL, HDL, LDLCALC, TRIG, CHOLHDL, LDLDIRECT in the last 72 hours. Thyroid Function Tests: No results for input(s): TSH, T4TOTAL, FREET4, T3FREE, THYROIDAB in the last 72 hours. Anemia Panel: No results for input(s): VITAMINB12, FOLATE, FERRITIN, TIBC, IRON, RETICCTPCT in the last 72 hours. Sepsis Labs: No results for input(s): PROCALCITON, LATICACIDVEN in the last 168 hours.  Recent Results (from the past 240 hour(s))  Resp  Panel by RT-PCR (Flu A&B, Covid) Nasopharyngeal Swab     Status: None   Collection Time: 12/26/20  3:12 AM   Specimen: Nasopharyngeal Swab; Nasopharyngeal(NP) swabs in vial transport medium  Result Value Ref Range Status   SARS Coronavirus 2 by RT PCR NEGATIVE NEGATIVE Final    Comment: (NOTE) SARS-CoV-2 target nucleic acids are NOT DETECTED.  The SARS-CoV-2 RNA is generally detectable in upper respiratory specimens during the acute phase of infection. The lowest concentration of SARS-CoV-2 viral copies this assay can detect is 138 copies/mL. A negative result does not preclude SARS-Cov-2 infection and should not be used as the sole basis for treatment or other patient management decisions. A negative result may occur with  improper specimen collection/handling, submission of  specimen other than nasopharyngeal swab, presence of viral mutation(s) within the areas targeted by this assay, and inadequate number of viral copies(<138 copies/mL). A negative result must be combined with clinical observations, patient history, and epidemiological information. The expected result is Negative.  Fact Sheet for Patients:  EntrepreneurPulse.com.au  Fact Sheet for Healthcare Providers:  IncredibleEmployment.be  This test is no t yet approved or cleared by the [place] FDA and  has been authorized for detection and/or diagnosis of SARS-CoV-2 by FDA under an Emergency Use Authorization (EUA). This EUA will remain  in effect (meaning this test can be used) for the duration of the COVID-19 declaration under Section 564(b)(1) of the Act, 21 U.S.C.section 360bbb-3(b)(1), unless the authorization is terminated  or revoked sooner.       Influenza A by PCR NEGATIVE NEGATIVE Final   Influenza B by PCR NEGATIVE NEGATIVE Final    Comment: (NOTE) The Xpert Xpress SARS-CoV-2/FLU/RSV plus assay is intended as an aid in the diagnosis of influenza from Nasopharyngeal  swab specimens and should not be used as a sole basis for treatment. Nasal washings and aspirates are unacceptable for Xpert Xpress SARS-CoV-2/FLU/RSV testing.  Fact Sheet for Patients: EntrepreneurPulse.com.au  Fact Sheet for Healthcare Providers: IncredibleEmployment.be  This test is not yet approved or cleared by the [place] FDA and has been authorized for detection and/or diagnosis of SARS-CoV-2 by FDA under an Emergency Use Authorization (EUA). This EUA will remain in effect (meaning this test can be used) for the duration of the COVID-19 declaration under Section 564(b)(1) of the Act, 21 U.S.C. section 360bbb-3(b)(1), unless the authorization is terminated or revoked.  Performed at [facility], [place] [address] [place], [place] [address]   C Difficile Quick Screen w PCR reflex     Status: Abnormal   Collection Time: 12/26/20 11:18 AM   Specimen: STOOL  Result Value Ref Range Status   C Diff antigen POSITIVE (A) NEGATIVE Final   C Diff toxin NEGATIVE NEGATIVE Final   C Diff interpretation Results are indeterminate. See PCR results.  Final    Comment: Performed at [facility], [place] [address], [place], [place] [address]  C. Diff by PCR, Reflexed     Status: None   Collection Time: 12/26/20 11:18 AM  Result Value Ref Range Status   Toxigenic C. Difficile by PCR NEGATIVE NEGATIVE Final    Comment: Patient is colonized with non toxigenic C. difficile. May not need treatment unless significant symptoms are present. Performed at [facility], [place] [address], [place],  [address]          Radiology Studies: No results found.      Scheduled Meds:  amoxicillin-clavulanate  1 tablet Oral Q12H   atorvastatin  40 mg Oral Daily   metoprolol tartrate  12.5 mg Oral BID   pantoprazole  40 mg Oral BID AC   psyllium  1 packet Oral Daily   tamsulosin  0.4 mg Oral QPC supper   Continuous Infusions:  lactated  ringers 75 mL/hr at 12/26/20 0647     LOS: 2 days   Time spent= 35 mins    Caryssa Elzey Arsenio Loader, MD [facility]  If 7PM-7AM, please contact night-coverage  12/29/2020, 10:52 AM

## 2020-12-29 NOTE — Progress Notes (Signed)
[facility] ROUNDING NOTE   Subjective: No acute events overnight.  Pt removed rectal tube, subsequently replaced.  Continues to show maroon colored liquid stools, although rectal tube sometimes clogged with semisolid stool  Hgb drifted down to 10.1.     Objective: Vital signs in last 24 hours: Temp:  [97.5 F (36.4 C)-98 F (36.7 C)] 97.7 F (36.5 C) (10/29 0805) Pulse Rate:  [83-88] 88 (10/29 0805) Resp:  [19-20] 19 (10/29 0805) BP: (124-152)/(86-100) 152/91 (10/29 0805) SpO2:  [93 %-97 %] 95 % (10/29 0805) Last BM Date: (P) 12/28/20 General: NAD, pleasant, demented Caucasian male, accompanied by daughter Rectal tube:  No stool in bag currently    Intake/Output from previous day: 10/28 0701 - 10/29 0700 In: -  Out: 350 [Urine:350] Intake/Output this shift: No intake/output data recorded.   Lab Results: Recent Labs    12/27/20 0349 12/27/20 0926 12/28/20 0320 12/28/20 0804 12/29/20 0123  WBC 9.8  --  5.7  --  5.6  HGB 11.7*   < > 10.8* 11.0* 10.1*  PLT 140*  --  129*  --  129*  MCV 85.9  --  86.2  --  87.4   < > = values in this interval not displayed.   BMET Recent Labs    12/27/20 0349 12/28/20 0320 12/29/20 0123  NA 136 139 139  K 3.7 3.5 3.8  CL 106 109 108  CO2 25 27 27   GLUCOSE 96 102* 107*  BUN 19 16 24*  CREATININE 0.98 1.21 1.21  CALCIUM 8.2* 8.2* 8.1*   LFT No results for input(s): PROT, ALBUMIN, AST, ALT, ALKPHOS, BILITOT, BILIDIR, IBILI in the last 72 hours. PT/INR No results for input(s): INR in the last 72 hours.    Imaging/Other results: No results found.    Assessment and Plan:  86 year old male with Alzheimer's and atrial fibrillation on Eliquis, presented with profuse bloody bowel movements x 1 day, consistent with a diverticular bleed.  His hemoglobin has continued to drift slowly from 13 to 10. Hemodynamically stable with elevated blood pressures.  CT-A 10/26 with no active bleeding, but notable for mild  uncomplicated diverticulitis.     GI bleed, presumed diverticular - Not briskly bleeding, but unfortunately has not stopped spontaneously yet - Continue to monitor, no plans for endoscopic examination currently given his dementia, difficulty in achieving an adequate bowel, and absence of serious bleeding.  I again discussed the possibility of colonoscopy with his daughter, and she was also in favor of continued observation and monitoring as opposed to invasive procedures.  I think this is the correct choice - Check CBC q24hrs - Would prefer to see nonbloody stool before discharge home - Continue to hold anticoagulation.  Follow up with PCM to readdress risks/benefits of anticoagulation   Left sided diverticulitis, uncomplicated - Continue Augmentin BID for 5 day course   Constipation - Patient chronic constipation but suffers from liquid stools with incontinence at home, suggestive of overflow incontinence - We discussed trying a home bowel regimen of metamucil every day with senna 8.6 mg every 2 or 3 days without a formed bowel movement - Metamucil daily    11/26, MD  12/29/2020, 12:13 PM [facility]

## 2020-12-30 DIAGNOSIS — I480 Paroxysmal atrial fibrillation: Secondary | ICD-10-CM | POA: Diagnosis not present

## 2020-12-30 DIAGNOSIS — K5791 Diverticulosis of intestine, part unspecified, without perforation or abscess with bleeding: Secondary | ICD-10-CM | POA: Diagnosis not present

## 2020-12-30 DIAGNOSIS — K2971 Gastritis, unspecified, with bleeding: Secondary | ICD-10-CM | POA: Diagnosis not present

## 2020-12-30 DIAGNOSIS — G309 Alzheimer's disease, unspecified: Secondary | ICD-10-CM | POA: Diagnosis not present

## 2020-12-30 LAB — BASIC METABOLIC PANEL
Anion gap: 6 (ref 5–15)
BUN: 22 mg/dL (ref 8–23)
CO2: 25 mmol/L (ref 22–32)
Calcium: 8.2 mg/dL — ABNORMAL LOW (ref 8.9–10.3)
Chloride: 107 mmol/L (ref 98–111)
Creatinine, Ser: 1.16 mg/dL (ref 0.61–1.24)
GFR, Estimated: >60 mL/min (ref >60)
Glucose, Bld: 118 mg/dL — ABNORMAL HIGH (ref 70–99)
Potassium: 3.5 mmol/L (ref 3.5–5.1)
Sodium: 138 mmol/L (ref 135–145)

## 2020-12-30 LAB — CBC
HCT: 32.2 % — ABNORMAL LOW (ref 39.0–52.0)
Hemoglobin: 10.4 g/dL — ABNORMAL LOW (ref 13.0–17.0)
MCH: 28.4 pg (ref 26.0–34.0)
MCHC: 32.3 g/dL (ref 30.0–36.0)
MCV: 88 fL (ref 80.0–100.0)
Platelets: 126 10*3/uL — ABNORMAL LOW (ref 150–400)
RBC: 3.66 MIL/uL — ABNORMAL LOW (ref 4.22–5.81)
RDW: 14.4 % (ref 11.5–15.5)
WBC: 6.3 10*3/uL (ref 4.0–10.5)
nRBC: 0 % (ref 0.0–0.2)

## 2020-12-30 LAB — MAGNESIUM: Magnesium: 1.8 mg/dL (ref 1.7–2.4)

## 2020-12-30 NOTE — Progress Notes (Signed)
PT Cancellation Note  Patient Details Name: Ryan [name] MRN: [id number] DOB: [birth year]   Cancelled Treatment:    Reason Eval/Treat Not Completed: PT screened, no needs identified, will sign off  See also OT note. Daughter present and reports pt was up with OT and is at his baseline for mobility. She requested to defer PT evaluation at this time and does not appear to be indicated.    Jerolyn Center, PT Acute Rehabilitation Services  Pager [phone] Office [phone]   Zena Amos 12/30/2020, 1:36 PM

## 2020-12-30 NOTE — Evaluation (Signed)
Occupational Therapy Evaluation Patient Details Name: Ryan [name] MRN: [id number] DOB: 1935-02-07 Today's Date: 12/30/2020   History of Present Illness 86 y.o. M admitted on 10/26 due to bloody BM's. CTA showed mild diverticulitis of descending colon. PMH significant for Alzheimer's, Afib, HTN, and HLD.   Clinical Impression   Pt admitted for concerns listed above. PTA pt's daughter reported that he has 24/7 care from family, who assist with all ADL's and IADL's, and supervise his mobility with which he uses a RW. At this time, pt continues to present at baseline level with some mild decrease in activity tolerance. Daughter reported that they have all the equipment they need and stated that he does not need any OT services at this time. Acute OT will sign off.       Recommendations for follow up therapy are one component of a multi-disciplinary discharge planning process, led by the attending physician.  Recommendations may be updated based on patient status, additional functional criteria and insurance authorization.   Follow Up Recommendations  No OT follow up    Assistance Recommended at Discharge Frequent or constant Supervision/Assistance  Functional Status Assessment  Patient has not had a recent decline in their functional status  Equipment Recommendations  None recommended by OT    Recommendations for Other Services       Precautions / Restrictions Precautions Precautions: Fall Restrictions Weight Bearing Restrictions: No      Mobility Bed Mobility Overal bed mobility: Modified Independent             General bed mobility comments: increased time and HOB elevated    Transfers Overall transfer level: Needs assistance Equipment used: Rolling walker (2 wheels) Transfers: Sit to/from Stand Sit to Stand: Min guard;Min assist           General transfer comment: Assist for lines and tubes,min g for safety      Balance Overall balance assessment: Mild  deficits observed, not formally tested                                         ADL either performed or assessed with clinical judgement   ADL Overall ADL's : At baseline                                       General ADL Comments: Per daughter, pt is functioning at his same level as before. Pt able to assist some with bathing at the sink, incontinent of B and B, Sup-min guard for transfers.     Vision Baseline Vision/History: 1 Wears glasses Ability to See in Adequate Light: 0 Adequate Patient Visual Report: No change from baseline Vision Assessment?: No apparent visual deficits     Perception     Praxis      Pertinent Vitals/Pain Pain Assessment: No/denies pain     Hand Dominance Right   Extremity/Trunk Assessment Upper Extremity Assessment Upper Extremity Assessment: Overall WFL for tasks assessed   Lower Extremity Assessment Lower Extremity Assessment: Defer to PT evaluation   Cervical / Trunk Assessment Cervical / Trunk Assessment: Kyphotic   Communication Communication Communication: No difficulties   Cognition Arousal/Alertness: Awake/alert Behavior During Therapy: WFL for tasks assessed/performed Overall Cognitive Status: History of cognitive impairments - at baseline  General Comments: Pt has alzhiemers.     General Comments  Rectal tube in place, new male purewick in place, VSS on RA.    Exercises     Shoulder Instructions      Home Living Family/patient expects to be discharged to:: Private residence Living Arrangements: Children Available Help at Discharge: Family;Available 24 hours/day Type of Home: Independent living facility Home Access: Level entry     Home Layout: One level     Bathroom Shower/Tub: Occupational psychologist: Handicapped height Bathroom Accessibility: Yes How Accessible: Accessible via walker Home Equipment: Rolling Walker (2  wheels);BSC;Shower seat;Grab bars - toilet;Grab bars - tub/shower;Hand held shower head;Transport chair          Prior Functioning/Environment Prior Level of Function : Needs assist  Cognitive Assist : Mobility (cognitive);ADLs (cognitive) Mobility (Cognitive): Intermittent cues ADLs (Cognitive): Step by step cues Physical Assist : Mobility (physical);ADLs (physical) Mobility (physical): Transfers;Gait ADLs (physical): Bathing;Dressing;Toileting;IADLs Mobility Comments: Pt uses RW to ambulate with, requring supervision to min guard depending on the environment, family uses transport chair for most community mobility ADLs Comments: Family assists heavily with all ADL's        OT Problem List: Decreased strength;Decreased activity tolerance;Impaired balance (sitting and/or standing);Decreased cognition;Decreased safety awareness      OT Treatment/Interventions:      OT Goals(Current goals can be found in the care plan section) Acute Rehab OT Goals Patient Stated Goal: none stated OT Goal Formulation: All assessment and education complete, DC therapy Time For Goal Achievement: 12/30/20 Potential to Achieve Goals: Good  OT Frequency:     Barriers to D/C:            Co-evaluation              AM-PAC OT "6 Clicks" Daily Activity     Outcome Measure Help from another person eating meals?: None Help from another person taking care of personal grooming?: A Little Help from another person toileting, which includes using toliet, bedpan, or urinal?: A Lot Help from another person bathing (including washing, rinsing, drying)?: A Lot Help from another person to put on and taking off regular upper body clothing?: A Little Help from another person to put on and taking off regular lower body clothing?: A Lot 6 Click Score: 16   End of Session Equipment Utilized During Treatment: Gait belt;Rolling walker (2 wheels) Nurse Communication: Mobility status  Activity Tolerance:  Patient tolerated treatment well Patient left: in bed;with call bell/phone within reach;with nursing/sitter in room;with family/visitor present  OT Visit Diagnosis: Unsteadiness on feet (R26.81);Muscle weakness (generalized) (M62.81)                Time: [id number] OT Time Calculation (min): 39 min Charges:  OT General Charges $OT Visit: 1 Visit OT Evaluation $OT Eval Low Complexity: 1 Low OT Treatments $Self Care/Home Management : 23-37 mins  Hovanes Hymas H., OTR/L Acute Rehabilitation  Myrella Fahs Elane Louna Rothgeb 12/30/2020, 11:57 AM

## 2020-12-30 NOTE — Progress Notes (Signed)
[facility] ROUNDING NOTE   Subjective: No acute events overnight.  Hgb stable at 10.4  Rectal tube not putting out much due to solid stool, but blood still noted in stool early this morning.  No other concerns    Objective: Vital signs in last 24 hours: Temp:  [97.6 F (36.4 C)-98.7 F (37.1 C)] 97.6 F (36.4 C) (10/30 1235) Pulse Rate:  [71-86] 71 (10/30 1235) Resp:  [15-18] 17 (10/30 1235) BP: (113-174)/(74-98) 113/85 (10/30 1235) SpO2:  [95 %-98 %] 95 % (10/30 1235) Last BM Date: 12/29/20 General: NAD, pleasant demented Caucasian male, accompanied by daughter. Exam not performed today     Intake/Output from previous day: 10/29 0701 - 10/30 0700 In: 819.3 [I.V.:819.3] Out: 2250 [Urine:2250] Intake/Output this shift: Total I/O In: 1005 [I.V.:1005] Out: -    Lab Results: Recent Labs    12/28/20 0320 12/28/20 0804 12/29/20 0123 12/30/20 0109  WBC 5.7  --  5.6 6.3  HGB 10.8* 11.0* 10.1* 10.4*  PLT 129*  --  129* 126*  MCV 86.2  --  87.4 88.0   BMET Recent Labs    12/28/20 0320 12/29/20 0123 12/30/20 0109  NA 139 139 138  K 3.5 3.8 3.5  CL 109 108 107  CO2 27 27 25   GLUCOSE 102* 107* 118*  BUN 16 24* 22  CREATININE 1.21 1.21 1.16  CALCIUM 8.2* 8.1* 8.2*   LFT No results for input(s): PROT, ALBUMIN, AST, ALT, ALKPHOS, BILITOT, BILIDIR, IBILI in the last 72 hours. PT/INR No results for input(s): INR in the last 72 hours.    Imaging/Other results: No results found.    Assessment and Plan: 86 year old male with Alzheimer's and atrial fibrillation on Eliquis, presented with profuse bloody bowel movements x 1 day, consistent with a diverticular bleed.  His hemoglobin has continued to drift slowly from 13 to 10. Hemodynamically stable with elevated blood pressures.  CT-A 10/26 with no active bleeding, but notable for mild uncomplicated diverticulitis.     GI bleed, presumed diverticular - Not briskly bleeding, but has continued ooze -  Hgb stable today - Continue to monitor, no plans for endoscopic examination currently given his dementia, difficulty in achieving an adequate bowel, and absence of serious bleeding.   - Check CBC q24hrs - Would prefer to see nonbloody stool before discharge home - Recommend discontinuing rectal tube now that patient is having solid stool - Continue to hold anticoagulation.  Follow up with PCM to readdress risks/benefits of anticoagulation   Left sided diverticulitis, uncomplicated - Continue Augmentin BID for 5 day course   Constipation - Patient chronic constipation but suffers from liquid stools with incontinence at home, suggestive of overflow incontinence - We discussed trying a home bowel regimen of metamucil every day with senna 8.6 mg every 2 or 3 days without a formed bowel movement - Metamucil daily  Dr. 11/26 is taking over the GI service tomorrow  Patient can follow up with me in clinic in 2-4 weeks. We will contact his daughter to schedule   Leonides Schanz, MD  12/30/2020, 3:39 PM [facility]

## 2020-12-30 NOTE — Progress Notes (Signed)
PROGRESS NOTE    Ryan [name]  HQP:[id number] DOB: April 23, 1934 DOA: 12/25/2020 PCP: [name], MD   Brief Narrative:  86 year old with history of Alzheimer's, atrial fibrillation on Eliquis comes to the hospital with profuse bloody bowel movement.  Got diagnosed with likely diverticular bleed but due to persistent bleeding he had CTA done which did not show any active bleeding but did show uncomplicated mild diverticulitis of descending colon.  He was started on Augmentin.  His Eliquis was held.  Patient was seen by GI team as well.  His diet was slowly advanced.   Assessment & Plan:   Principal Problem:   GI bleeding Active Problems:   AF (paroxysmal atrial fibrillation) (HCC)   GI bleed   Primary hypertension   Alzheimer disease (HCC)   Prolonged QT interval   Lower GI bleeding, likely diverticular -Bleeding  appears to have slowed down.  Hemoglobin this morning is 10.4.   Baseline is 13.5.  Eliquis still on hold.  CTA is negative for any active bleeding but does show uncomplicated diverticulitis.  Continue to monitor hemoglobin. GI Following.   Constipation with overflow incontinence - Bowel regimen prn, metamucil added.   Uncomplicated diverticulitis of descending colon - Overall asymptomatic but will treated with 7 days of oral Augmentin.  Last day 11/2.  Diet as tolerated.   History of paroxysmal atrial fibrillation - Continue metoprolol but will continue to hold Eliquis.  Patient and family/daughters have been advised to discuss long-term anticoagulation with the PCP and they understand.  Will likely continue holding eliquis until followed up by outpnt PCP, at that time can re discuss resuming it.   Essential hypertension - Resume home meds  Alzheimer's dementia - Stable   Will get PT/OT.   DVT prophylaxis: SCDs Code Status: Full code Family Communication: Daughter at bedside.   Nutritional status           Body mass index is 25.94  kg/m.  Pressure Injury 01/31/20 Leg Left 2 full thickness wounds to left upper thigh (Active)  01/31/20 0625  Location: Leg  Location Orientation: Left  Staging:   Wound Description (Comments): 2 full thickness wounds to left upper thigh  Present on Admission: Yes          Subjective: Per daughter still had some blood in the stool yesterday. No other acute events overnight. He was able to ambulate in the hallway with the help of his daughter.   Review of Systems Otherwise negative except as per HPI, including: General = no fevers, chills, dizziness,  fatigue HEENT/EYES = negative for loss of vision, double vision, blurred vision,  sore throa Cardiovascular= negative for chest pain, palpitation Respiratory/lungs= negative for shortness of breath, cough, wheezing; hemoptysis,  Gastrointestinal= negative for nausea, vomiting, abdominal pain Genitourinary= negative for Dysuria MSK = Negative for arthralgia, myalgias Neurology= Negative for headache, numbness, tingling  Psychiatry= Negative for suicidal and homocidal ideation Skin= Negative for Rash   Examination:  Constitutional: Not in acute distress Respiratory: Clear to auscultation bilaterally Cardiovascular: Normal sinus rhythm, no rubs Abdomen: Nontender nondistended good bowel sounds Musculoskeletal: No edema noted Skin: No rashes seen Neurologic: CN 2-12 grossly intact.  And nonfocal Psychiatric: Poor judgment and insight.    Objective: Vitals:   12/30/20 0001 12/30/20 0300 12/30/20 0852 12/30/20 1001  BP: (!) 146/98 (!) 151/84 (!) 148/87 132/74  Pulse: 76 71 86 83  Resp: 15 15 17    Temp: 97.9 F (36.6 C) 97.7 F (36.5 C)    TempSrc:  Axillary Axillary Axillary   SpO2: 96% 98% 96%   Weight:      Height:        Intake/Output Summary (Last 24 hours) at 12/30/2020 1028 Last data filed at 12/30/2020 0740 Gross per 24 hour  Intake 1824.26 ml  Output 2250 ml  Net -425.74 ml    Filed Weights    12/26/20 2335  Weight: 82 kg     Data Reviewed:   CBC: Recent Labs  Lab 12/25/20 1820 12/26/20 0313 12/27/20 0349 12/27/20 0926 12/27/20 2005 12/28/20 0320 12/28/20 0804 12/29/20 0123 12/30/20 0109  WBC 7.6  --  9.8  --   --  5.7  --  5.6 6.3  HGB 13.4   < > 11.7*   < > 11.4* 10.8* 11.0* 10.1* 10.4*  HCT 41.4   < > 34.7*   < > 35.4* 32.4* 33.4* 30.6* 32.2*  MCV 87.9  --  85.9  --   --  86.2  --  87.4 88.0  PLT 152  --  140*  --   --  129*  --  129* 126*   < > = values in this interval not displayed.   Basic Metabolic Panel: Recent Labs  Lab 12/25/20 1820 12/27/20 0349 12/28/20 0320 12/29/20 0123 12/30/20 0109  NA 139 136 139 139 138  K 4.1 3.7 3.5 3.8 3.5  CL 104 106 109 108 107  CO2 27 25 27 27 25   GLUCOSE 123* 96 102* 107* 118*  BUN 35* 19 16 24* 22  CREATININE 1.17 0.98 1.21 1.21 1.16  CALCIUM 9.0 8.2* 8.2* 8.1* 8.2*  MG  --  1.8 1.9 1.8 1.8   GFR: Estimated Creatinine Clearance: 48.9 mL/min (by C-G formula based on SCr of 1.16 mg/dL). Liver Function Tests: Recent Labs  Lab 12/25/20 1820  AST 18  ALT 20  ALKPHOS 71  BILITOT 1.0  PROT 6.5  ALBUMIN 3.4*   No results for input(s): LIPASE, AMYLASE in the last 168 hours. No results for input(s): AMMONIA in the last 168 hours. Coagulation Profile: Recent Labs  Lab 12/25/20 1820  INR 1.3*   Cardiac Enzymes: No results for input(s): CKTOTAL, CKMB, CKMBINDEX, TROPONINI in the last 168 hours. BNP (last 3 results) No results for input(s): PROBNP in the last 8760 hours. HbA1C: No results for input(s): HGBA1C in the last 72 hours. CBG: No results for input(s): GLUCAP in the last 168 hours. Lipid Profile: No results for input(s): CHOL, HDL, LDLCALC, TRIG, CHOLHDL, LDLDIRECT in the last 72 hours. Thyroid Function Tests: No results for input(s): TSH, T4TOTAL, FREET4, T3FREE, THYROIDAB in the last 72 hours. Anemia Panel: No results for input(s): VITAMINB12, FOLATE, FERRITIN, TIBC, IRON, RETICCTPCT in  the last 72 hours. Sepsis Labs: No results for input(s): PROCALCITON, LATICACIDVEN in the last 168 hours.  Recent Results (from the past 240 hour(s))  Resp Panel by RT-PCR (Flu A&B, Covid) Nasopharyngeal Swab     Status: None   Collection Time: 12/26/20  3:12 AM   Specimen: Nasopharyngeal Swab; Nasopharyngeal(NP) swabs in vial transport medium  Result Value Ref Range Status   SARS Coronavirus 2 by RT PCR NEGATIVE NEGATIVE Final    Comment: (NOTE) SARS-CoV-2 target nucleic acids are NOT DETECTED.  The SARS-CoV-2 RNA is generally detectable in upper respiratory specimens during the acute phase of infection. The lowest concentration of SARS-CoV-2 viral copies this assay can detect is 138 copies/mL. A negative result does not preclude SARS-Cov-2 infection and should not be used as the  sole basis for treatment or other patient management decisions. A negative result may occur with  improper specimen collection/handling, submission of specimen other than nasopharyngeal swab, presence of viral mutation(s) within the areas targeted by this assay, and inadequate number of viral copies(<138 copies/mL). A negative result must be combined with clinical observations, patient history, and epidemiological information. The expected result is Negative.  Fact Sheet for Patients:  BloggerCourse.com  Fact Sheet for Healthcare Providers:  SeriousBroker.it  This test is no t yet approved or cleared by the [place] FDA and  has been authorized for detection and/or diagnosis of SARS-CoV-2 by FDA under an Emergency Use Authorization (EUA). This EUA will remain  in effect (meaning this test can be used) for the duration of the COVID-19 declaration under Section 564(b)(1) of the Act, 21 U.S.C.section 360bbb-3(b)(1), unless the authorization is terminated  or revoked sooner.       Influenza A by PCR NEGATIVE NEGATIVE Final   Influenza B by PCR  NEGATIVE NEGATIVE Final    Comment: (NOTE) The Xpert Xpress SARS-CoV-2/FLU/RSV plus assay is intended as an aid in the diagnosis of influenza from Nasopharyngeal swab specimens and should not be used as a sole basis for treatment. Nasal washings and aspirates are unacceptable for Xpert Xpress SARS-CoV-2/FLU/RSV testing.  Fact Sheet for Patients: BloggerCourse.com  Fact Sheet for Healthcare Providers: SeriousBroker.it  This test is not yet approved or cleared by the [place] FDA and has been authorized for detection and/or diagnosis of SARS-CoV-2 by FDA under an Emergency Use Authorization (EUA). This EUA will remain in effect (meaning this test can be used) for the duration of the COVID-19 declaration under Section 564(b)(1) of the Act, 21 U.S.C. section 360bbb-3(b)(1), unless the authorization is terminated or revoked.  Performed at [facility], [address], [place], [place] [address]   C Difficile Quick Screen w PCR reflex     Status: Abnormal   Collection Time: 12/26/20 11:18 AM   Specimen: STOOL  Result Value Ref Range Status   C Diff antigen POSITIVE (A) NEGATIVE Final   C Diff toxin NEGATIVE NEGATIVE Final   C Diff interpretation Results are indeterminate. See PCR results.  Final    Comment: Performed at [facility], [address], [place], [place] [address]  C. Diff by PCR, Reflexed     Status: None   Collection Time: 12/26/20 11:18 AM  Result Value Ref Range Status   Toxigenic C. Difficile by PCR NEGATIVE NEGATIVE Final    Comment: Patient is colonized with non toxigenic C. difficile. May not need treatment unless significant symptoms are present. Performed at [facility], [address], [place], [place] [address]          Radiology Studies: No results found.      Scheduled Meds:  amoxicillin-clavulanate  1 tablet Oral Q12H   atorvastatin  40 mg Oral Daily   metoprolol  tartrate  12.5 mg Oral BID   pantoprazole  40 mg Oral BID AC   psyllium  1 packet Oral Daily   tamsulosin  0.4 mg Oral QPC supper   Continuous Infusions:  lactated ringers 75 mL/hr at 12/30/20 0740     LOS: 3 days   Time spent= 35 mins    Driana Dazey Joline Maxcy, MD [facility]  If 7PM-7AM, please contact night-coverage  12/30/2020, 10:28 AM

## 2020-12-31 ENCOUNTER — Other Ambulatory Visit (HOSPITAL_COMMUNITY): Payer: Self-pay

## 2020-12-31 DIAGNOSIS — K5791 Diverticulosis of intestine, part unspecified, without perforation or abscess with bleeding: Secondary | ICD-10-CM | POA: Diagnosis not present

## 2020-12-31 DIAGNOSIS — I480 Paroxysmal atrial fibrillation: Secondary | ICD-10-CM | POA: Diagnosis not present

## 2020-12-31 DIAGNOSIS — G309 Alzheimer's disease, unspecified: Secondary | ICD-10-CM | POA: Diagnosis not present

## 2020-12-31 DIAGNOSIS — K2971 Gastritis, unspecified, with bleeding: Secondary | ICD-10-CM | POA: Diagnosis not present

## 2020-12-31 LAB — CBC
HCT: 32.7 % — ABNORMAL LOW (ref 39.0–52.0)
Hemoglobin: 10.4 g/dL — ABNORMAL LOW (ref 13.0–17.0)
MCH: 28.1 pg (ref 26.0–34.0)
MCHC: 31.8 g/dL (ref 30.0–36.0)
MCV: 88.4 fL (ref 80.0–100.0)
Platelets: 134 10*3/uL — ABNORMAL LOW (ref 150–400)
RBC: 3.7 MIL/uL — ABNORMAL LOW (ref 4.22–5.81)
RDW: 14.5 % (ref 11.5–15.5)
WBC: 6.1 10*3/uL (ref 4.0–10.5)
nRBC: 0 % (ref 0.0–0.2)

## 2020-12-31 LAB — BASIC METABOLIC PANEL
Anion gap: 7 (ref 5–15)
BUN: 25 mg/dL — ABNORMAL HIGH (ref 8–23)
CO2: 26 mmol/L (ref 22–32)
Calcium: 8.5 mg/dL — ABNORMAL LOW (ref 8.9–10.3)
Chloride: 106 mmol/L (ref 98–111)
Creatinine, Ser: 1.13 mg/dL (ref 0.61–1.24)
GFR, Estimated: >60 mL/min (ref >60)
Glucose, Bld: 122 mg/dL — ABNORMAL HIGH (ref 70–99)
Potassium: 3.7 mmol/L (ref 3.5–5.1)
Sodium: 139 mmol/L (ref 135–145)

## 2020-12-31 LAB — MAGNESIUM: Magnesium: 1.7 mg/dL (ref 1.7–2.4)

## 2020-12-31 MED ORDER — MAGNESIUM OXIDE -MG SUPPLEMENT 400 (240 MG) MG PO TABS
800.0000 mg | ORAL_TABLET | Freq: Once | ORAL | Status: AC
  Administered 2020-12-31: 800 mg via ORAL
  Filled 2020-12-31: qty 2

## 2020-12-31 MED ORDER — POTASSIUM CHLORIDE CRYS ER 20 MEQ PO TBCR
40.0000 meq | EXTENDED_RELEASE_TABLET | Freq: Once | ORAL | Status: AC
  Administered 2020-12-31: 40 meq via ORAL
  Filled 2020-12-31: qty 2

## 2020-12-31 MED ORDER — PSYLLIUM 58.12 % PO PACK
1.0000 | PACK | Freq: Every day | ORAL | 0 refills | Status: AC
  Filled 2020-12-31: qty 30, 30d supply, fill #0

## 2020-12-31 MED ORDER — AMOXICILLIN-POT CLAVULANATE 875-125 MG PO TABS
1.0000 | ORAL_TABLET | Freq: Two times a day (BID) | ORAL | 0 refills | Status: AC
  Filled 2020-12-31: qty 10, 5d supply, fill #0

## 2020-12-31 NOTE — Discharge Summary (Signed)
Physician Discharge Summary  Ryan [name] [id number] DOB: 01-01-1935 DOA: 12/25/2020  PCP: Lajean Manes, MD  Admit date: 12/25/2020 Discharge date: 12/31/2020  Admitted From: Home Disposition:  Home  Recommendations for Outpatient Follow-up:  Follow up with PCP in 1-2 weeks Please obtain BMP/CBC in one week your next doctors visit.  Discontinue Eliquis.  Family to rediscuss resuming this during their next PCP visit with Dr. Felipa Eth 5 more days of oral Augmentin to complete 10-day course Outpatient follow-up with GI will be arranged by their service   Discharge Condition: Stable CODE STATUS: Full code Diet recommendation: Regular  Brief/Interim Summary: 86 year old with history of Alzheimer's, atrial fibrillation on Eliquis comes to the hospital with profuse bloody bowel movement.  Got diagnosed with likely diverticular bleed but due to persistent bleeding he had CTA done which did not show any active bleeding but did show uncomplicated mild diverticulitis of descending colon.  He was started on Augmentin.  His Eliquis was held.  Patient was seen by GI team as well.  His diet was slowly advanced.  He is tolerating oral diet without any issue.  Hemoglobin remained stable at 10.4 this morning.  GI recommends outpatient follow-up with their service which will be arranged by them.     Assessment & Plan:   Principal Problem:   GI bleeding Active Problems:   AF (paroxysmal atrial fibrillation) (HCC)   GI bleed   Primary hypertension   Alzheimer disease (HCC)   Prolonged QT interval     Lower GI bleeding, likely diverticular -Bleeding  appears to have slowed down.  Hemoglobin  remains 10.4   Baseline is 13.5.  Eliquis still on hold.  CTA is negative for any active bleeding but does show uncomplicated diverticulitis.  Continue to monitor hemoglobin. GI Following.    Constipation with overflow incontinence - Bowel regimen prn, metamucil added.   Uncomplicated diverticulitis  of descending colon - Overall asymptomatic but will treated with 7 days of oral Augmentin.  Last day 11/2.  Diet as tolerated.    History of paroxysmal atrial fibrillation - Continue metoprolol but will continue to hold Eliquis.  Patient and family/daughters have been advised to discuss long-term anticoagulation with the PCP and they understand.  Will likely continue holding eliquis until followed up by outpnt PCP, at that time can re discuss resuming it.    Essential hypertension - Resume home meds   Alzheimer's dementia - Stable    Body mass index is 25.94 kg/m.  Pressure Injury 01/31/20 Leg Left 2 full thickness wounds to left upper thigh (Active)  01/31/20 0625  Location: Leg  Location Orientation: Left  Staging:   Wound Description (Comments): 2 full thickness wounds to left upper thigh  Present on Admission: Yes        Discharge Diagnoses:  Principal Problem:   GI bleeding Active Problems:   AF (paroxysmal atrial fibrillation) (HCC)   GI bleed   Primary hypertension   Alzheimer disease ([place])   Prolonged QT interval      Consultations: GI  Subjective: Sitting up in the chair, does not have any complaints.  Feels great.  Daughter is present at bedside.  Discharge Exam: Vitals:   12/31/20 0021 12/31/20 0730  BP: (!) 143/101   Pulse: 84 88  Resp: 19 16  Temp: 98.4 F (36.9 C) 97.7 F (36.5 C)  SpO2: 95% 96%   Vitals:   12/30/20 2115 12/31/20 0000 12/31/20 0021 12/31/20 0730  BP: (!) 146/90 (!) 143/101 (!) 143/101  Pulse: 76  84 88  Resp:   19 16  Temp: 97.7 F (36.5 C)  98.4 F (36.9 C) 97.7 F (36.5 C)  TempSrc: Axillary  Axillary Oral  SpO2: 97%  95% 96%  Weight:      Height:        General: Pt is alert, awake, not in acute distress Cardiovascular: RRR, S1/S2 +, no rubs, no gallops Respiratory: CTA bilaterally, no wheezing, no rhonchi Abdominal: Soft, NT, ND, bowel sounds + Extremities: no edema, no cyanosis  Discharge  Instructions   Allergies as of 12/31/2020   No Known Allergies      Medication List     STOP taking these medications    Eliquis 5 MG Tabs tablet Generic drug: apixaban       TAKE these medications    amoxicillin-clavulanate 875-125 MG tablet Commonly known as: AUGMENTIN Take 1 tablet by mouth every 12 (twelve) hours for 5 days.   atorvastatin 40 MG tablet Commonly known as: LIPITOR Take 40 mg by mouth daily.   metoprolol tartrate 25 MG tablet Commonly known as: LOPRESSOR Take 0.5 tablets (12.5 mg total) by mouth 2 (two) times daily.   multivitamin tablet Take 1 tablet by mouth in the morning and at bedtime.   pantoprazole 40 MG tablet Commonly known as: PROTONIX TAKE 1 TABLET(40 MG) BY MOUTH TWICE DAILY   polyethylene glycol 17 g packet Commonly known as: MIRALAX / GLYCOLAX Take 17 g by mouth daily as needed for mild constipation.   PROBIOTIC ADVANCED PO Take 500 mg by mouth daily in the afternoon.   psyllium 95 % Pack Commonly known as: HYDROCIL/METAMUCIL Take 1 packet by mouth daily.   tamsulosin 0.4 MG Caps capsule Commonly known as: FLOMAX Take 0.4 mg by mouth daily after supper.   VITAMIN B-12 PO Take 1 tablet by mouth daily.        Follow-up Information     Stoneking, Hal, MD Follow up in 1 week(s).   Specialty: Internal Medicine Contact information: [address] [place] [address] [phone]                No Known Allergies  You were cared for by a hospitalist during your hospital stay. If you have any questions about your discharge medications or the care you received while you were in the hospital after you are discharged, you can call the unit and asked to speak with the hospitalist on call if the hospitalist that took care of you is not available. Once you are discharged, your primary care physician will handle any further medical issues. Please note that no refills for any discharge medications will be  authorized once you are discharged, as it is imperative that you return to your primary care physician (or establish a relationship with a primary care physician if you do not have one) for your aftercare needs so that they can reassess your need for medications and monitor your lab values.   Procedures/Studies: CT ANGIO GI BLEED  Result Date: 12/26/2020 CLINICAL DATA:  AFib on Eliquis.  Dark red blood per rectum. EXAM: CTA ABDOMEN AND PELVIS WITHOUT AND WITH CONTRAST TECHNIQUE: Multidetector CT imaging of the abdomen and pelvis was performed using the standard protocol during bolus administration of intravenous contrast. Multiplanar reconstructed images and MIPs were obtained and reviewed to evaluate the vascular anatomy. CONTRAST:  179mL OMNIPAQUE IOHEXOL 350 MG/ML SOLN COMPARISON:  CT abdomen and pelvis 01/31/2020 FINDINGS: VASCULAR Aorta: Normal caliber aorta without  aneurysm, dissection, vasculitis or significant stenosis. There is calcified atherosclerotic disease in the aorta. 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 Branches appear patent. No significant stenosis. SMA: Patent without evidence of aneurysm, dissection, vasculitis or significant stenosis. 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 Renal arteries are otherwise within normal limits. IMA: Patent without evidence of aneurysm, dissection, vasculitis or significant stenosis. Inflow: Patent without evidence of aneurysm, dissection, vasculitis or significant stenosis. Proximal Outflow: Bilateral common femoral and visualized portions of the superficial and profunda femoral arteries are patent without evidence of aneurysm, dissection, vasculitis or significant stenosis. Veins: Infrarenal IVC filter in place which appears unchanged in size. On arterial phase imaging there is some vascularity seen posterior to the IVC at the level of the  filter with some questionable arterial feeder vessel. There is some contrast opacification in the IVC at this level. Findings are unchanged from the prior examination. No thrombus identified. Review of the MIP images confirms the above findings. NON-VASCULAR Lower chest: There is atelectasis in the lung bases. The heart is enlarged Hepatobiliary: There is a hypodensity in the right lobe of the liver which is too small to characterize, likely a cyst or hemangioma. Gallbladder surgically absent. Pancreas: Unremarkable. No pancreatic ductal dilatation or surrounding inflammatory changes. Spleen: Normal in size without focal abnormality. Adrenals/Urinary Tract: There is a 2 cm right renal cyst. There is a 4.1 cm left renal cyst. There are additional hypodensities in both kidneys which are too small to characterize. There is no hydronephrosis or perinephric fluid. Bladder is distended. Numerous bladder diverticula are again seen. Bladder wall trabeculation is again noted. Adrenal glands are within normal limits. Stomach/Bowel: There are no areas of active extravasation of contrast on arterial or portal venous phase imaging within the bowel. There is no evidence for bowel obstruction or free air. There is diffuse colonic diverticulosis. There is mild inflammatory stranding surrounding diverticula in the distal descending colon compatible with acute diverticulitis. There is no evidence for perforation or abscess. Lymphatic: Aortic atherosclerosis. No enlarged abdominal or pelvic lymph nodes. Reproductive: Prostate gland is mildly enlarged, unchanged. Other: There is no free fluid. There is a small fat containing umbilical hernia. Musculoskeletal: There are healed right-sided rib fractures. Multilevel degenerative changes affect the spine. There are degenerative changes of the right hip. Left hip arthroplasty is present. IMPRESSION: VASCULAR 1. No evidence for active gastrointestinal bleeding. 2. IVC filter in place. There  is vascularity posterior to the IVC at the level of the filter with questionable arterial feeder vessels and contrast opacification of the IVC on arterial phase imaging. Findings are suspicious for AV malformation or fistula at this level. 3. 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 4. 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 NON-VASCULAR 1. Acute uncomplicated descending colon diverticulitis. 2. Stable trabeculated bladder wall with multiple bladder diverticula suggesting chronic bladder outlet obstruction. Electronically Signed   By: Darliss Cheney M.D.   On: 12/26/2020 21:41     The results of significant diagnostics from this hospitalization (including imaging, microbiology, ancillary and laboratory) are listed below for reference.     Microbiology: Recent Results (from the past 240 hour(s))  Resp Panel by RT-PCR (Flu A&B, Covid) Nasopharyngeal Swab     Status: None   Collection Time: 12/26/20  3:12 AM   Specimen: Nasopharyngeal Swab; Nasopharyngeal(NP) swabs in vial transport medium  Result  Value Ref Range Status   SARS Coronavirus 2 by RT PCR NEGATIVE NEGATIVE Final    Comment: (NOTE) SARS-CoV-2 target nucleic acids are NOT DETECTED.  The SARS-CoV-2 RNA is generally detectable in upper respiratory specimens during the acute phase of infection. The lowest concentration of SARS-CoV-2 viral copies this assay can detect is 138 copies/mL. A negative result does not preclude SARS-Cov-2 infection and should not be used as the sole basis for treatment or other patient management decisions. A negative result may occur with  improper specimen collection/handling, submission of specimen other than nasopharyngeal swab, presence of viral mutation(s) within the areas targeted by this assay, and inadequate number of viral copies(<138 copies/mL). A negative result must be combined with clinical observations, patient history, and epidemiological information. The expected result  is Negative.  Fact Sheet for Patients:  EntrepreneurPulse.com.au  Fact Sheet for Healthcare Providers:  IncredibleEmployment.be  This test is no t yet approved or cleared by the [place] FDA and  has been authorized for detection and/or diagnosis of SARS-CoV-2 by FDA under an Emergency Use Authorization (EUA). This EUA will remain  in effect (meaning this test can be used) for the duration of the COVID-19 declaration under Section 564(b)(1) of the Act, 21 U.S.C.section 360bbb-3(b)(1), unless the authorization is terminated  or revoked sooner.       Influenza A by PCR NEGATIVE NEGATIVE Final   Influenza B by PCR NEGATIVE NEGATIVE Final    Comment: (NOTE) The Xpert Xpress SARS-CoV-2/FLU/RSV plus assay is intended as an aid in the diagnosis of influenza from Nasopharyngeal swab specimens and should not be used as a sole basis for treatment. Nasal washings and aspirates are unacceptable for Xpert Xpress SARS-CoV-2/FLU/RSV testing.  Fact Sheet for Patients: EntrepreneurPulse.com.au  Fact Sheet for Healthcare Providers: IncredibleEmployment.be  This test is not yet approved or cleared by the [place] FDA and has been authorized for detection and/or diagnosis of SARS-CoV-2 by FDA under an Emergency Use Authorization (EUA). This EUA will remain in effect (meaning this test can be used) for the duration of the COVID-19 declaration under Section 564(b)(1) of the Act, 21 U.S.C. section 360bbb-3(b)(1), unless the authorization is terminated or revoked.  Performed at [facility], [place] [address] [place], [place] [address]   C Difficile Quick Screen w PCR reflex     Status: Abnormal   Collection Time: 12/26/20 11:18 AM   Specimen: STOOL  Result Value Ref Range Status   C Diff antigen POSITIVE (A) NEGATIVE Final   C Diff toxin NEGATIVE NEGATIVE Final   C Diff interpretation Results are  indeterminate. See PCR results.  Final    Comment: Performed at [facility], [place] [address], [place], [place] [address]  C. Diff by PCR, Reflexed     Status: None   Collection Time: 12/26/20 11:18 AM  Result Value Ref Range Status   Toxigenic C. Difficile by PCR NEGATIVE NEGATIVE Final    Comment: Patient is colonized with non toxigenic C. difficile. May not need treatment unless significant symptoms are present. Performed at [facility], [place] [address], [place], [place] [address]      Labs: BNP (last 3 results) No results for input(s): BNP in the last 8760 hours. Basic Metabolic Panel: Recent Labs  Lab 12/27/20 0349 12/28/20 0320 12/29/20 0123 12/30/20 0109 12/31/20 0143  NA 136 139 139 138 139  K 3.7 3.5 3.8 3.5 3.7  CL 106 109 108 107 106  CO2 25 27 27  25 26  GLUCOSE 96 102* 107* 118* 122*  BUN 19 16 24* 22 25*  CREATININE 0.98 1.21 1.21 1.16 1.13  CALCIUM 8.2* 8.2* 8.1* 8.2* 8.5*  MG 1.8 1.9 1.8 1.8 1.7   Liver Function Tests: Recent Labs  Lab 12/25/20 1820  AST 18  ALT 20  ALKPHOS 71  BILITOT 1.0  PROT 6.5  ALBUMIN 3.4*   No results for input(s): LIPASE, AMYLASE in the last 168 hours. No results for input(s): AMMONIA in the last 168 hours. CBC: Recent Labs  Lab 12/27/20 0349 12/27/20 0926 12/28/20 0320 12/28/20 0804 12/29/20 0123 12/30/20 0109 12/31/20 0143  WBC 9.8  --  5.7  --  5.6 6.3 6.1  HGB 11.7*   < > 10.8* 11.0* 10.1* 10.4* 10.4*  HCT 34.7*   < > 32.4* 33.4* 30.6* 32.2* 32.7*  MCV 85.9  --  86.2  --  87.4 88.0 88.4  PLT 140*  --  129*  --  129* 126* 134*   < > = values in this interval not displayed.   Cardiac Enzymes: No results for input(s): CKTOTAL, CKMB, CKMBINDEX, TROPONINI in the last 168 hours. BNP: Invalid input(s): POCBNP CBG: No results for input(s): GLUCAP in the last 168 hours. D-Dimer No results for input(s): DDIMER in the last 72 hours. Hgb A1c No results for input(s): HGBA1C in the last 72 hours. Lipid  Profile No results for input(s): CHOL, HDL, LDLCALC, TRIG, CHOLHDL, LDLDIRECT in the last 72 hours. Thyroid function studies No results for input(s): TSH, T4TOTAL, T3FREE, THYROIDAB in the last 72 hours.  Invalid input(s): FREET3 Anemia work up No results for input(s): VITAMINB12, FOLATE, FERRITIN, TIBC, IRON, RETICCTPCT in the last 72 hours. Urinalysis No results found for: COLORURINE, APPEARANCEUR, [place], [place], GLUCOSEU, [place], [place], [place], PROTEINUR, UROBILINOGEN, NITRITE, LEUKOCYTESUR Sepsis Labs Invalid input(s): PROCALCITONIN,  WBC,  LACTICIDVEN Microbiology Recent Results (from the past 240 hour(s))  Resp Panel by RT-PCR (Flu A&B, Covid) Nasopharyngeal Swab     Status: None   Collection Time: 12/26/20  3:12 AM   Specimen: Nasopharyngeal Swab; Nasopharyngeal(NP) swabs in vial transport medium  Result Value Ref Range Status   SARS Coronavirus 2 by RT PCR NEGATIVE NEGATIVE Final    Comment: (NOTE) SARS-CoV-2 target nucleic acids are NOT DETECTED.  The SARS-CoV-2 RNA is generally detectable in upper respiratory specimens during the acute phase of infection. The lowest concentration of SARS-CoV-2 viral copies this assay can detect is 138 copies/mL. A negative result does not preclude SARS-Cov-2 infection and should not be used as the sole basis for treatment or other patient management decisions. A negative result may occur with  improper specimen collection/handling, submission of specimen other than nasopharyngeal swab, presence of viral mutation(s) within the areas targeted by this assay, and inadequate number of viral copies(<138 copies/mL). A negative result must be combined with clinical observations, patient history, and epidemiological information. The expected result is Negative.  Fact Sheet for Patients:  EntrepreneurPulse.com.au  Fact Sheet for Healthcare Providers:  IncredibleEmployment.be  This test is no t yet  approved or cleared by the [place] FDA and  has been authorized for detection and/or diagnosis of SARS-CoV-2 by FDA under an Emergency Use Authorization (EUA). This EUA will remain  in effect (meaning this test can be used) for the duration of the COVID-19 declaration under Section 564(b)(1) of the Act, 21 U.S.C.section 360bbb-3(b)(1), unless the authorization is terminated  or revoked sooner.       Influenza A by PCR NEGATIVE NEGATIVE  Final   Influenza B by PCR NEGATIVE NEGATIVE Final    Comment: (NOTE) The Xpert Xpress SARS-CoV-2/FLU/RSV plus assay is intended as an aid in the diagnosis of influenza from Nasopharyngeal swab specimens and should not be used as a sole basis for treatment. Nasal washings and aspirates are unacceptable for Xpert Xpress SARS-CoV-2/FLU/RSV testing.  Fact Sheet for Patients: EntrepreneurPulse.com.au  Fact Sheet for Healthcare Providers: IncredibleEmployment.be  This test is not yet approved or cleared by the [place] FDA and has been authorized for detection and/or diagnosis of SARS-CoV-2 by FDA under an Emergency Use Authorization (EUA). This EUA will remain in effect (meaning this test can be used) for the duration of the COVID-19 declaration under Section 564(b)(1) of the Act, 21 U.S.C. section 360bbb-3(b)(1), unless the authorization is terminated or revoked.  Performed at [facility], [place] [address], [place], [place] [address]   C Difficile Quick Screen w PCR reflex     Status: Abnormal   Collection Time: 12/26/20 11:18 AM   Specimen: STOOL  Result Value Ref Range Status   C Diff antigen POSITIVE (A) NEGATIVE Final   C Diff toxin NEGATIVE NEGATIVE Final   C Diff interpretation Results are indeterminate. See PCR results.  Final    Comment: Performed at [facility], [place] [address], [place], [place] [address]  C. Diff by PCR, Reflexed     Status: None   Collection Time: 12/26/20  11:18 AM  Result Value Ref Range Status   Toxigenic C. Difficile by PCR NEGATIVE NEGATIVE Final    Comment: Patient is colonized with non toxigenic C. difficile. May not need treatment unless significant symptoms are present. Performed at [facility], [place] [address] [place], [place] [address]      Time coordinating discharge:  I have spent 35 minutes face to face with the patient and on the ward discussing the patients care, assessment, plan and disposition with other care givers. >50% of the time was devoted counseling the patient about the risks and benefits of treatment/Discharge disposition and coordinating care.   SIGNED:   Damita Lack, MD  [facility] 12/31/2020, 11:55 AM   If 7PM-7AM, please contact night-coverage

## 2020-12-31 NOTE — Progress Notes (Signed)
Brief GI Progress Note: Hb has remained stable today. GI will sign off. We will arrange for GI follow up with Dr. Tomasa Rand in 2-4 weeks.

## 2020-12-31 NOTE — Progress Notes (Signed)
Discharge education and packet provided to daughter Britta Mccreedy at bedside, all questions and concerns addressed, no concerns noted.

## 2020-12-31 NOTE — Care Management Important Message (Signed)
Important Message  Patient Details  Name: PLEASANT BENSINGER MRN: [id number] Date of Birth: [birth year]   Medicare Important Message Given:  Yes     Musa Rewerts 12/31/2020, 2:26 PM

## 2020-12-31 NOTE — Progress Notes (Signed)
Providing patient am medications, pt needs encouragement to swallow all pills, large pills were broken in half, pt did not swallow pills or water, pills dissolved on tongue, caregiver at bedside reported pt has intermittent choking with eating food and with water.

## 2020-12-31 NOTE — Progress Notes (Signed)
Patient with discharge orders in, has not received slp assessment as of yet, family is on the way for transport, reached out to slp via phone numbers three times, phone number left, awaiting return call. Pt was able to swallow medications at this time without coughing.

## 2021-01-01 DIAGNOSIS — G459 Transient cerebral ischemic attack, unspecified: Secondary | ICD-10-CM

## 2021-01-01 HISTORY — DX: Transient cerebral ischemic attack, unspecified: G45.9

## 2021-01-04 ENCOUNTER — Telehealth: Payer: Self-pay

## 2021-01-04 NOTE — Telephone Encounter (Signed)
Called and spoke with patients daughter she stated she would call back to schedule a follow up appointment with Dr.Cunningham.

## 2021-01-15 ENCOUNTER — Emergency Department (HOSPITAL_COMMUNITY): Payer: Medicare Other

## 2021-01-15 ENCOUNTER — Observation Stay (HOSPITAL_COMMUNITY): Payer: Medicare Other

## 2021-01-15 ENCOUNTER — Observation Stay (HOSPITAL_COMMUNITY)
Admission: EM | Admit: 2021-01-15 | Discharge: 2021-01-17 | Disposition: A | Discharge status: Home / Self Care | Payer: Medicare Other | Attending: Emergency Medicine | Admitting: Emergency Medicine

## 2021-01-15 DIAGNOSIS — R479 Unspecified speech disturbances: Secondary | ICD-10-CM

## 2021-01-15 DIAGNOSIS — G459 Transient cerebral ischemic attack, unspecified: Principal | ICD-10-CM | POA: Insufficient documentation

## 2021-01-15 DIAGNOSIS — I10 Essential (primary) hypertension: Secondary | ICD-10-CM | POA: Diagnosis not present

## 2021-01-15 DIAGNOSIS — Z20822 Contact with and (suspected) exposure to covid-19: Secondary | ICD-10-CM | POA: Diagnosis not present

## 2021-01-15 DIAGNOSIS — F0280 Dementia in other diseases classified elsewhere without behavioral disturbance: Secondary | ICD-10-CM | POA: Diagnosis present

## 2021-01-15 DIAGNOSIS — N400 Enlarged prostate without lower urinary tract symptoms: Secondary | ICD-10-CM | POA: Diagnosis present

## 2021-01-15 DIAGNOSIS — I482 Chronic atrial fibrillation, unspecified: Secondary | ICD-10-CM | POA: Diagnosis present

## 2021-01-15 DIAGNOSIS — R062 Wheezing: Secondary | ICD-10-CM

## 2021-01-15 DIAGNOSIS — R2689 Other abnormalities of gait and mobility: Secondary | ICD-10-CM | POA: Insufficient documentation

## 2021-01-15 DIAGNOSIS — F02C0 Dementia in other diseases classified elsewhere, severe, without behavioral disturbance, psychotic disturbance, mood disturbance, and anxiety: Secondary | ICD-10-CM | POA: Insufficient documentation

## 2021-01-15 DIAGNOSIS — Z96649 Presence of unspecified artificial hip joint: Secondary | ICD-10-CM | POA: Insufficient documentation

## 2021-01-15 DIAGNOSIS — I6782 Cerebral ischemia: Secondary | ICD-10-CM | POA: Insufficient documentation

## 2021-01-15 DIAGNOSIS — E782 Mixed hyperlipidemia: Secondary | ICD-10-CM | POA: Diagnosis present

## 2021-01-15 DIAGNOSIS — Z79899 Other long term (current) drug therapy: Secondary | ICD-10-CM | POA: Diagnosis not present

## 2021-01-15 DIAGNOSIS — R4789 Other speech disturbances: Secondary | ICD-10-CM | POA: Diagnosis present

## 2021-01-15 DIAGNOSIS — G319 Degenerative disease of nervous system, unspecified: Secondary | ICD-10-CM | POA: Diagnosis not present

## 2021-01-15 DIAGNOSIS — I480 Paroxysmal atrial fibrillation: Secondary | ICD-10-CM | POA: Diagnosis present

## 2021-01-15 DIAGNOSIS — G309 Alzheimer's disease, unspecified: Secondary | ICD-10-CM | POA: Diagnosis present

## 2021-01-15 LAB — URINALYSIS, ROUTINE W REFLEX MICROSCOPIC
Bilirubin Urine: NEGATIVE
Glucose, UA: NEGATIVE mg/dL
Hgb urine dipstick: NEGATIVE
Ketones, ur: NEGATIVE mg/dL
Leukocytes,Ua: NEGATIVE
Nitrite: NEGATIVE
Protein, ur: NEGATIVE mg/dL
Specific Gravity, Urine: 1.018 (ref 1.005–1.030)
pH: 5 (ref 5.0–8.0)

## 2021-01-15 LAB — CBC
HCT: 33.3 % — ABNORMAL LOW (ref 39.0–52.0)
Hemoglobin: 10.6 g/dL — ABNORMAL LOW (ref 13.0–17.0)
MCH: 27.4 pg (ref 26.0–34.0)
MCHC: 31.8 g/dL (ref 30.0–36.0)
MCV: 86 fL (ref 80.0–100.0)
Platelets: 159 10*3/uL (ref 150–400)
RBC: 3.87 MIL/uL — ABNORMAL LOW (ref 4.22–5.81)
RDW: 14.4 % (ref 11.5–15.5)
WBC: 5.4 10*3/uL (ref 4.0–10.5)
nRBC: 0 % (ref 0.0–0.2)

## 2021-01-15 LAB — PROTIME-INR
INR: 1.1 (ref 0.8–1.2)
Prothrombin Time: 14.6 seconds (ref 11.4–15.2)

## 2021-01-15 LAB — COMPREHENSIVE METABOLIC PANEL
ALT: 18 U/L (ref 0–44)
AST: 19 U/L (ref 15–41)
Albumin: 3.5 g/dL (ref 3.5–5.0)
Alkaline Phosphatase: 90 U/L (ref 38–126)
Anion gap: 8 (ref 5–15)
BUN: 19 mg/dL (ref 8–23)
CO2: 25 mmol/L (ref 22–32)
Calcium: 8.5 mg/dL — ABNORMAL LOW (ref 8.9–10.3)
Chloride: 106 mmol/L (ref 98–111)
Creatinine, Ser: 1.18 mg/dL (ref 0.61–1.24)
GFR, Estimated: >60 mL/min (ref >60)
Glucose, Bld: 103 mg/dL — ABNORMAL HIGH (ref 70–99)
Potassium: 3.7 mmol/L (ref 3.5–5.1)
Sodium: 139 mmol/L (ref 135–145)
Total Bilirubin: 0.8 mg/dL (ref 0.3–1.2)
Total Protein: 6.5 g/dL (ref 6.5–8.1)

## 2021-01-15 LAB — I-STAT CHEM 8, ED
BUN: 20 mg/dL (ref 8–23)
Calcium, Ion: 1.18 mmol/L (ref 1.15–1.40)
Chloride: 106 mmol/L (ref 98–111)
Creatinine, Ser: 1.2 mg/dL (ref 0.61–1.24)
Glucose, Bld: 99 mg/dL (ref 70–99)
HCT: 33 % — ABNORMAL LOW (ref 39.0–52.0)
Hemoglobin: 11.2 g/dL — ABNORMAL LOW (ref 13.0–17.0)
Potassium: 3.7 mmol/L (ref 3.5–5.1)
Sodium: 142 mmol/L (ref 135–145)
TCO2: 25 mmol/L (ref 22–32)

## 2021-01-15 LAB — DIFFERENTIAL
Abs Immature Granulocytes: 0.03 10*3/uL (ref 0.00–0.07)
Basophils Absolute: 0 10*3/uL (ref 0.0–0.1)
Basophils Relative: 1 %
Eosinophils Absolute: 0.2 10*3/uL (ref 0.0–0.5)
Eosinophils Relative: 4 %
Immature Granulocytes: 1 %
Lymphocytes Relative: 19 %
Lymphs Abs: 1 10*3/uL (ref 0.7–4.0)
Monocytes Absolute: 0.6 10*3/uL (ref 0.1–1.0)
Monocytes Relative: 12 %
Neutro Abs: 3.5 10*3/uL (ref 1.7–7.7)
Neutrophils Relative %: 63 %

## 2021-01-15 LAB — AMMONIA: Ammonia: <10 umol/L (ref 9–35)

## 2021-01-15 LAB — RESP PANEL BY RT-PCR (FLU A&B, COVID) ARPGX2
Influenza A by PCR: NEGATIVE
Influenza B by PCR: NEGATIVE
SARS Coronavirus 2 by RT PCR: NEGATIVE

## 2021-01-15 LAB — VITAMIN B12: Vitamin B-12: 594 pg/mL (ref 180–914)

## 2021-01-15 LAB — APTT: aPTT: 29 seconds (ref 24–36)

## 2021-01-15 IMAGING — MR MR HEAD W/O CM
13 of 14 series · 44 of 48 positions shown · non-contrast
Comparison: [DATE]

CLINICAL DATA: Acute neurologic deficit

EXAM:
MRI HEAD WITHOUT CONTRAST
TECHNIQUE: Multiplanar, multiecho pulse sequences of the brain and surrounding
structures were obtained without intravenous contrast.

[Series 7: DWI · coronal · 4.0mm · 0.88mm/px · 5 of 64 slices shown (1 of 4)]
[im 1/64]
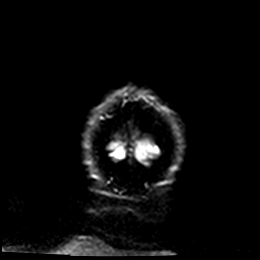
[im 16/64]
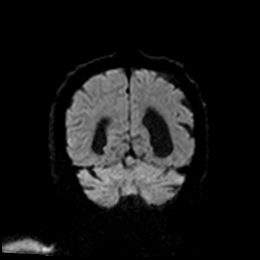
[im 32/64]
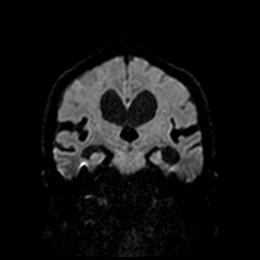
[im 48/64]
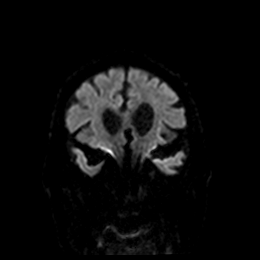
[im 64/64]
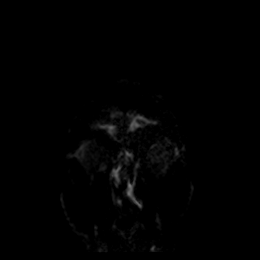

[Series 8: DWI · coronal · 4.0mm · 0.88mm/px · 2 of 32 slices shown (2 of 4)]
[im 1/32]
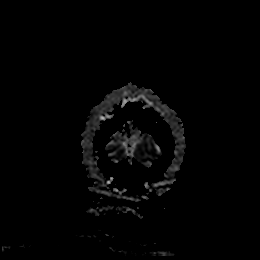
[im 32/32]
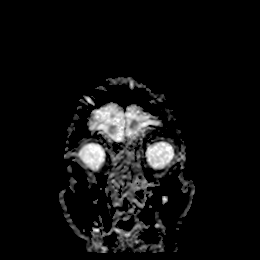

[Series 9: T1 · sagittal · 5.0mm · 0.75mm/px · 2 of 23 slices shown]
[im 1/23]
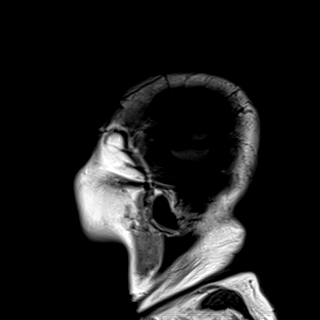
[im 23/23]
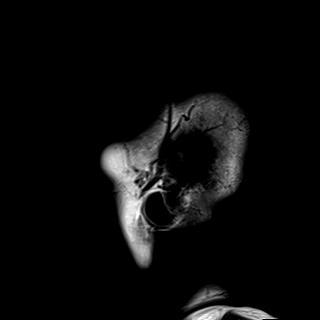

[Series 10: DWI · axial · 3.0mm · 0.88mm/px · z∈[-121,+25]mm · 8 of 100 slices shown (3 of 4)]
[im 1/100]
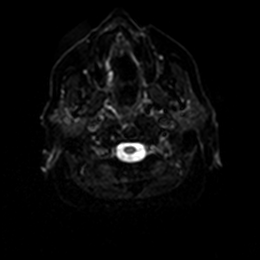
[im 15/100]
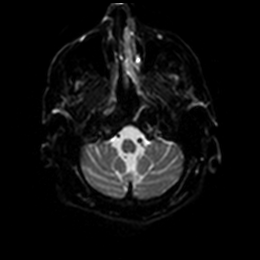
[im 29/100]
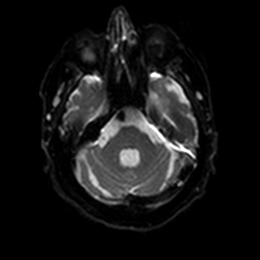
[im 43/100]
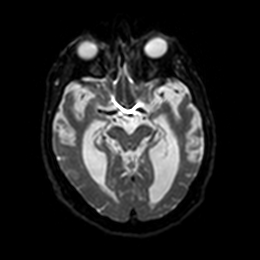
[im 57/100]
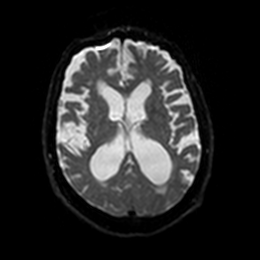
[im 71/100]
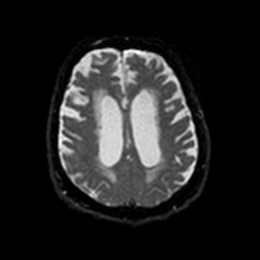
[im 85/100]
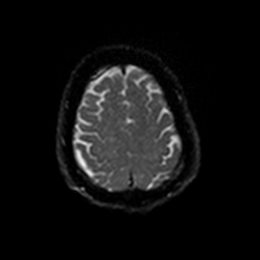
[im 100/100]
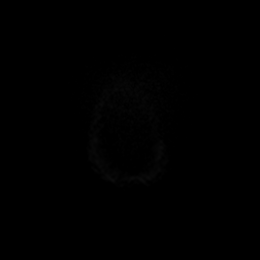

[Series 11: DWI · axial · 3.0mm · 0.88mm/px · z∈[-121,+25]mm · 4 of 50 slices shown (4 of 4)]
[im 1/50]
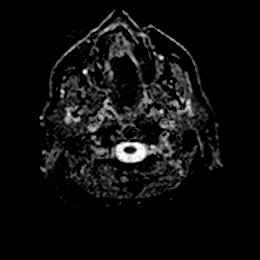
[im 17/50]
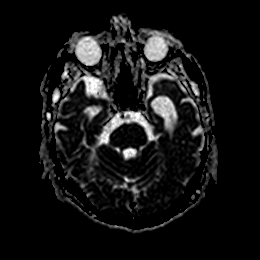
[im 33/50]
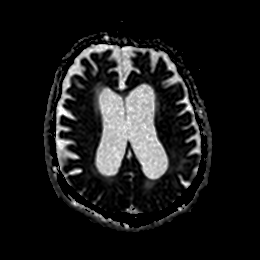
[im 50/50]
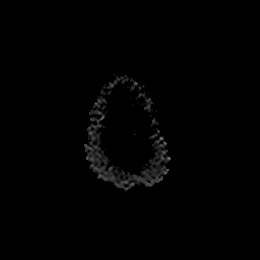

[Series 12: T2 · axial · 5.0mm · 0.72mm/px · z∈[-125,+30]mm · 2 of 27 slices shown (1 of 2)]
[im 1/27]
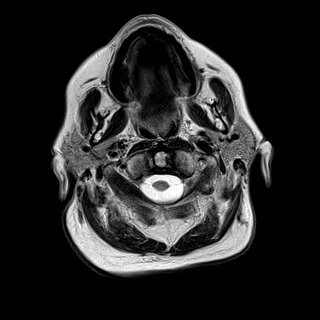
[im 27/27]
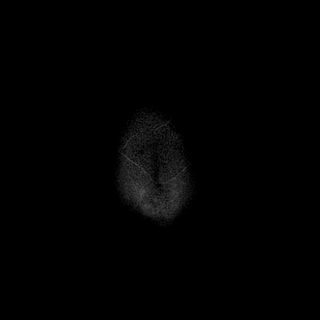

[Series 13: FLAIR · axial · 5.0mm · 0.45mm/px · z∈[-123,+32]mm · 2 of 27 slices shown (1 of 2)]
[im 1/27]
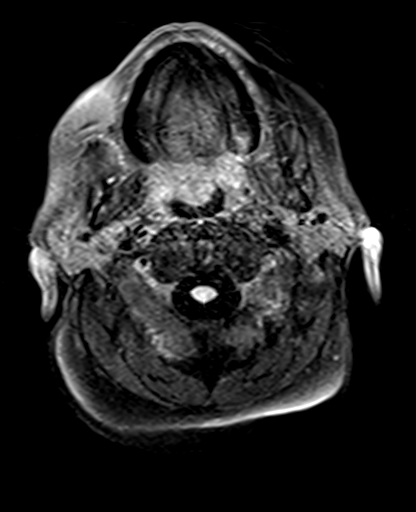
[im 27/27]
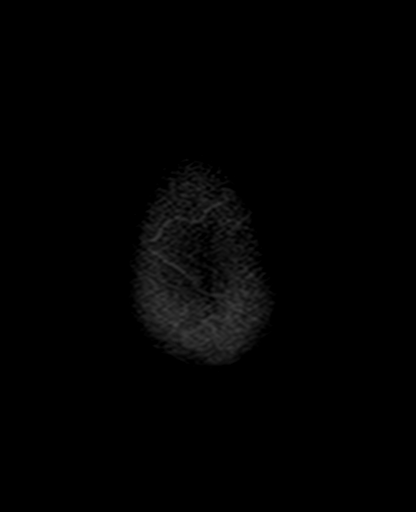

[Series 14: mag_images · axial · 3.0mm · 0.90mm/px · z∈[-118,+34]mm · 4 of 52 slices shown]
[im 1/52]
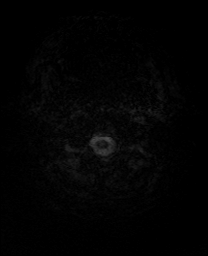
[im 18/52]
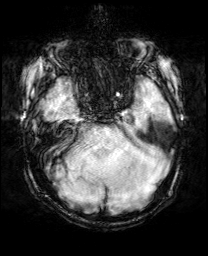
[im 35/52]
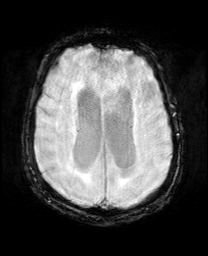
[im 52/52]
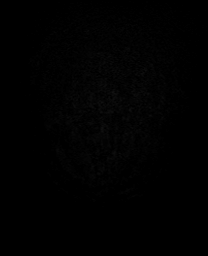

[Series 15: pha_images · axial · 3.0mm · 0.90mm/px · z∈[-118,+31]mm · 4 of 51 slices shown]
[im 1/51]
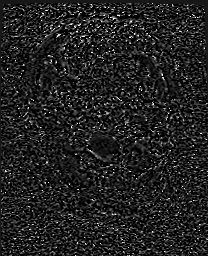
[im 17/51]
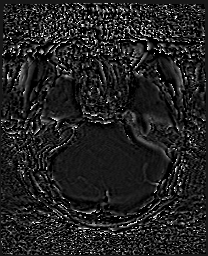
[im 34/51]
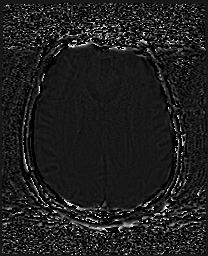
[im 51/51]
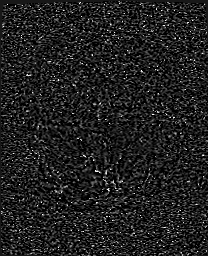

[Series 16: swi_images · axial · 3.0mm · 0.90mm/px · z∈[-118,+34]mm · 4 of 52 slices shown]
[im 1/52]
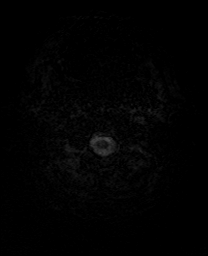
[im 18/52]
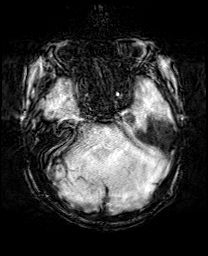
[im 35/52]
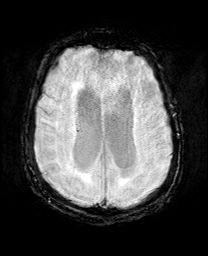
[im 52/52]
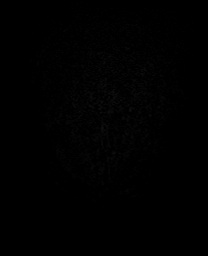

[Series 17: mip_images(sw) · axial · 24.0mm · 0.90mm/px · z∈[-107,+24]mm · 3 of 45 slices shown]
[im 1/45]
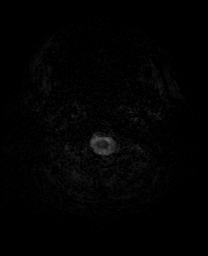
[im 23/45]
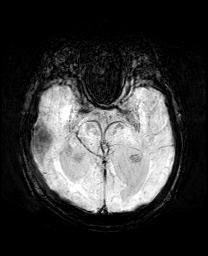
[im 45/45]
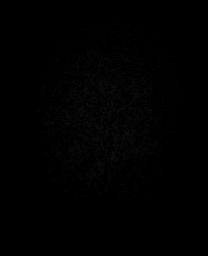

[Series 19: FLAIR · axial · 5.0mm · 0.90mm/px · z∈[-113,+30]mm · 2 of 25 slices shown (2 of 2)]
[im 1/25]
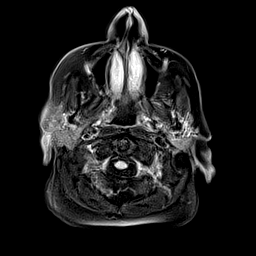
[im 25/25]
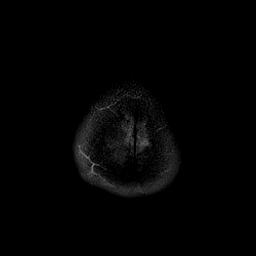

[Series 20: T2 · coronal · 5.0mm · 0.72mm/px · 2 of 27 slices shown (2 of 2)]
[im 1/27]
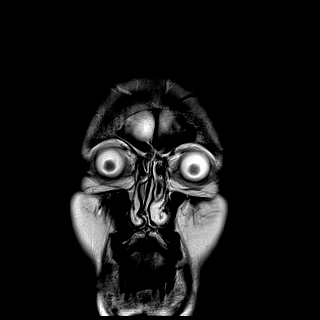
[im 27/27]
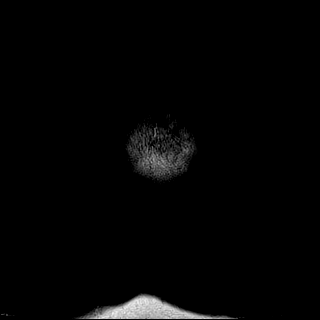

[44 of 48 positions shown; findings below may reference images not displayed]

FINDINGS: Brain: No acute infarct, mass effect or extra-axial collection. No
acute or chronic hemorrhage. Hyperintense T2-weighted signal is
widespread throughout the white matter. Diffuse, severe atrophy. The
midline structures are normal.

Vascular: Major flow voids are preserved.

Skull and upper cervical spine: Normal calvarium and skull base.
Visualized upper cervical spine and soft tissues are normal.

Sinuses/Orbits:No paranasal sinus fluid levels or advanced mucosal
thickening. No mastoid or middle ear effusion. Normal orbits.
IMPRESSION: 1. No acute intracranial abnormality.
2. Diffuse, severe atrophy and chronic ischemic microangiopathy.

## 2021-01-15 IMAGING — DX DG CHEST 1V
1 series · 1 of 1 positions shown · non-contrast
Comparison: Chest x-ray [DATE].

CLINICAL DATA: Wheezing.

EXAM:
CHEST  1 VIEW

[chest ap]
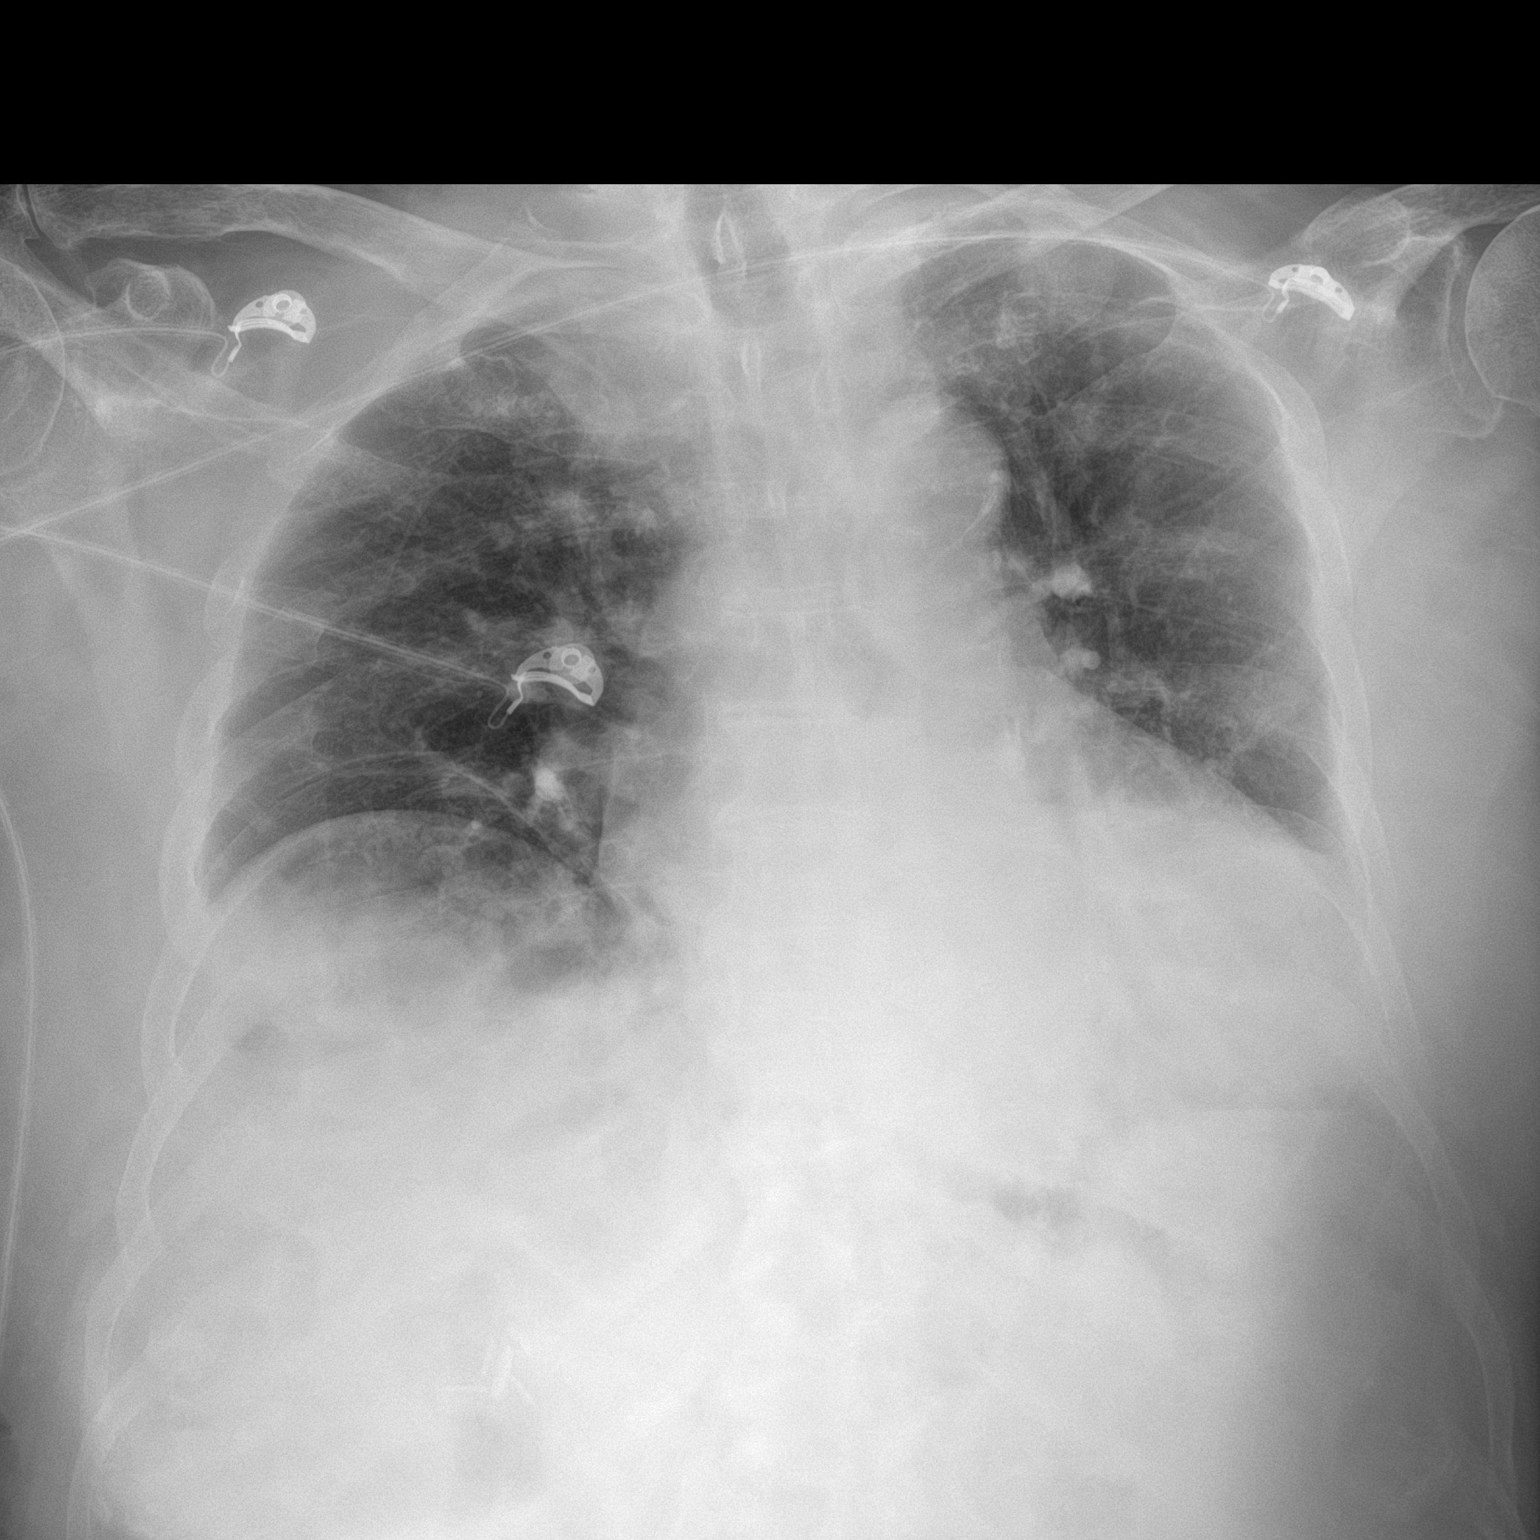

[1 of 1 positions shown; findings below may reference images not displayed]

FINDINGS: There is stable mild elevation of the right hemidiaphragm. The heart
is enlarged, unchanged. There is no focal lung consolidation,
pleural effusion or pneumothorax. No acute fractures are seen.
IMPRESSION: 1. Stable cardiomegaly.
2. No evidence for pneumonia or edema.

## 2021-01-15 IMAGING — CT CT HEAD W/O CM
3 of 4 series · 13 of 47 positions shown, 15 images · non-contrast
Comparison: CT [DATE].

CLINICAL DATA: Transient ischemic attack (TIA)

EXAM:
CT HEAD WITHOUT CONTRAST
TECHNIQUE: Contiguous axial images were obtained from the base of the skull
through the vertex without intravenous contrast.

[Series 3: head without · axial · non-contrast · 0.41mm/px · z∈[-157,-42]mm · 7 of 31 slices shown, 9 images]
[im 4/31  brain]
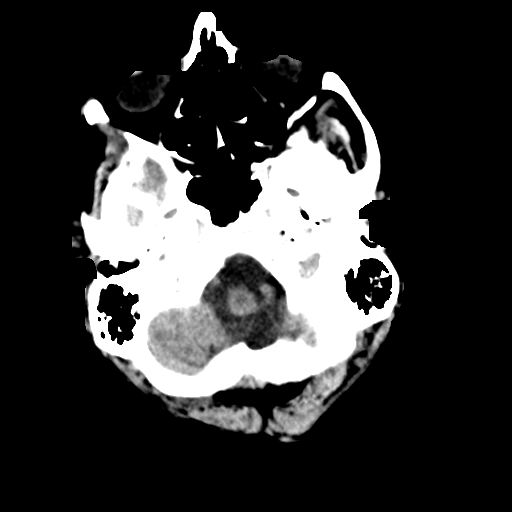
[im 4/31  bone]
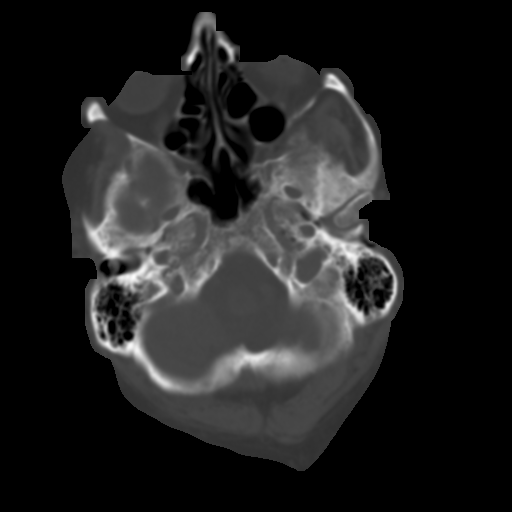
[im 8/31  brain]
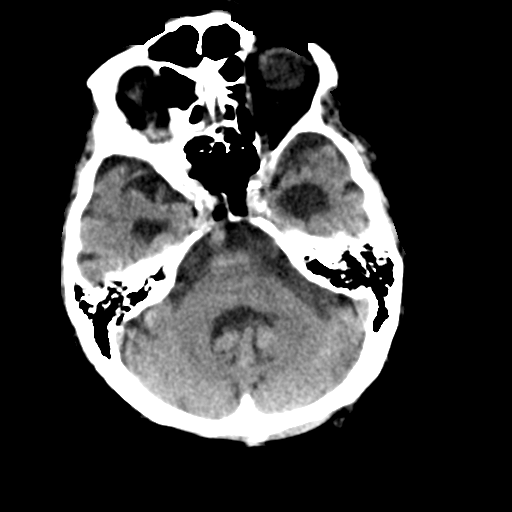
[im 12/31  brain]
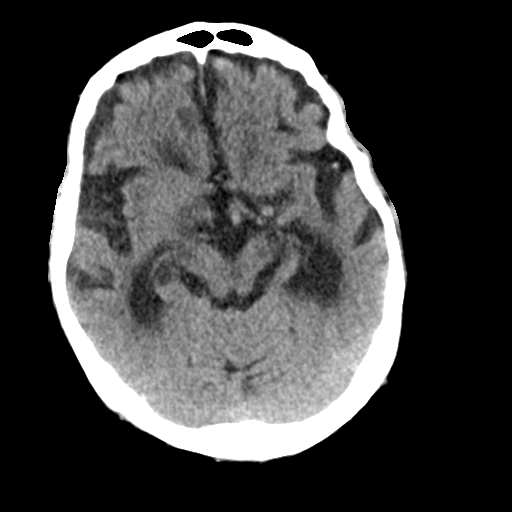
[im 16/31  brain]
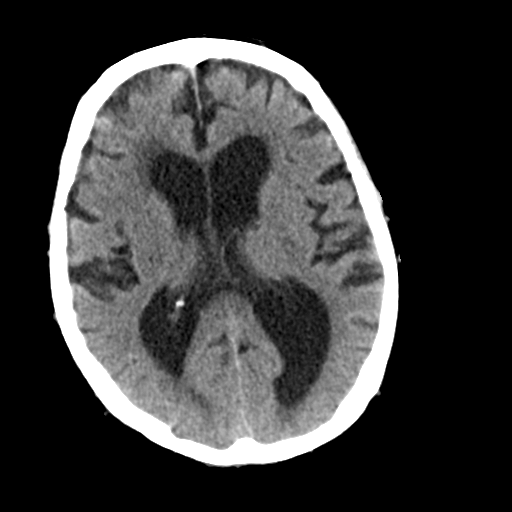
[im 19/31  brain]
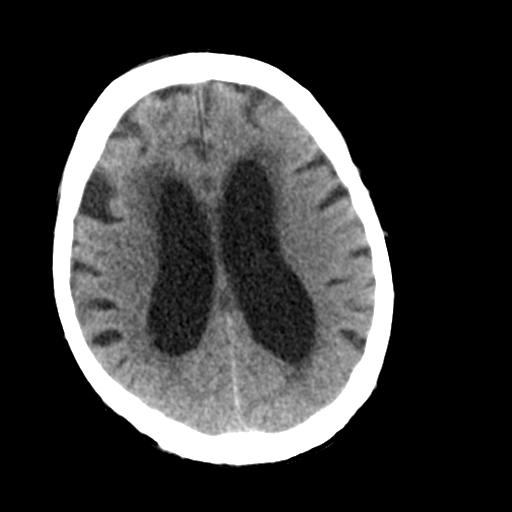
[im 19/31  bone]
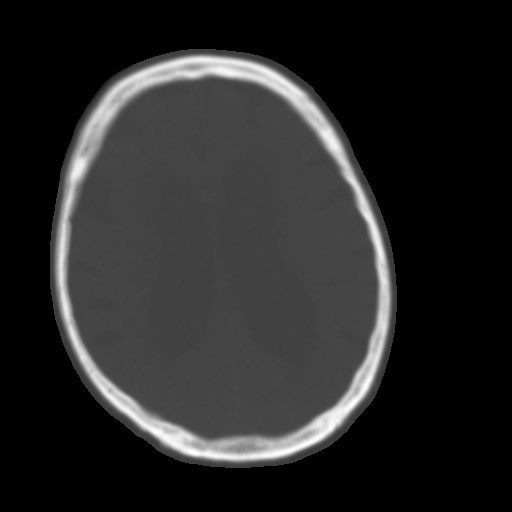
[im 23/31  brain]
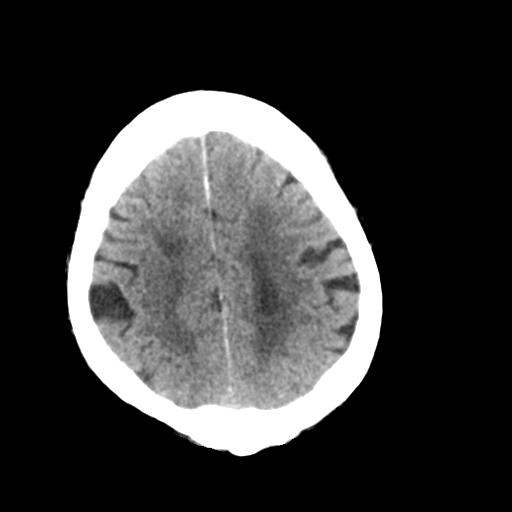
[im 27/31  brain]
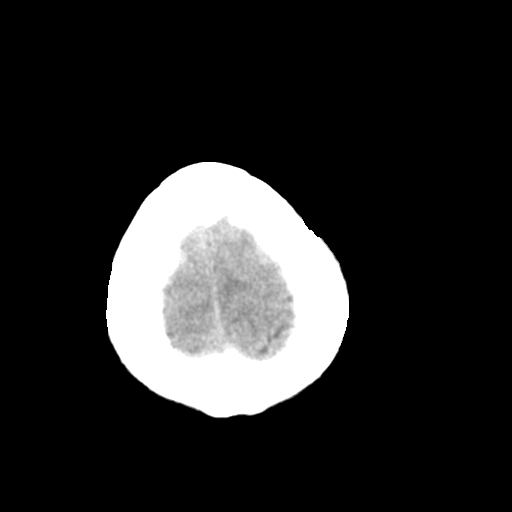

[Series 5: head without cor · coronal · non-contrast · 0.30mm/px · 3 of 67 slices shown]
[im 23/67  brain]
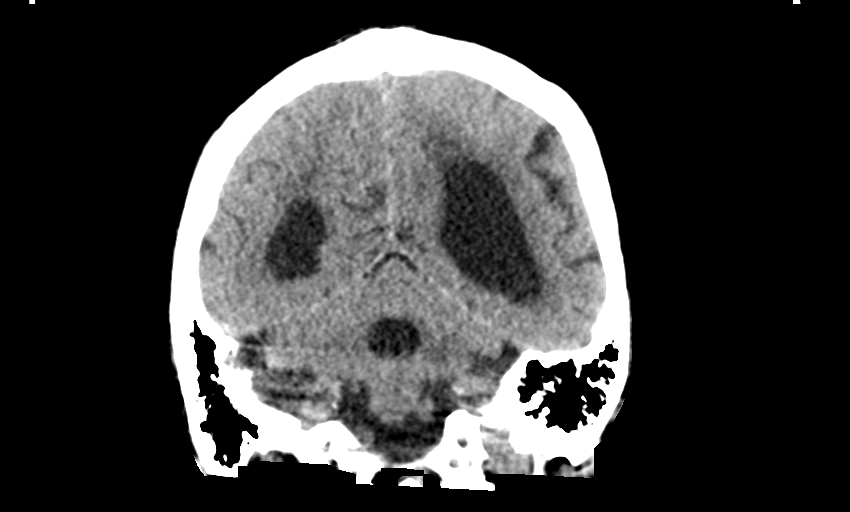
[im 30/67  brain]
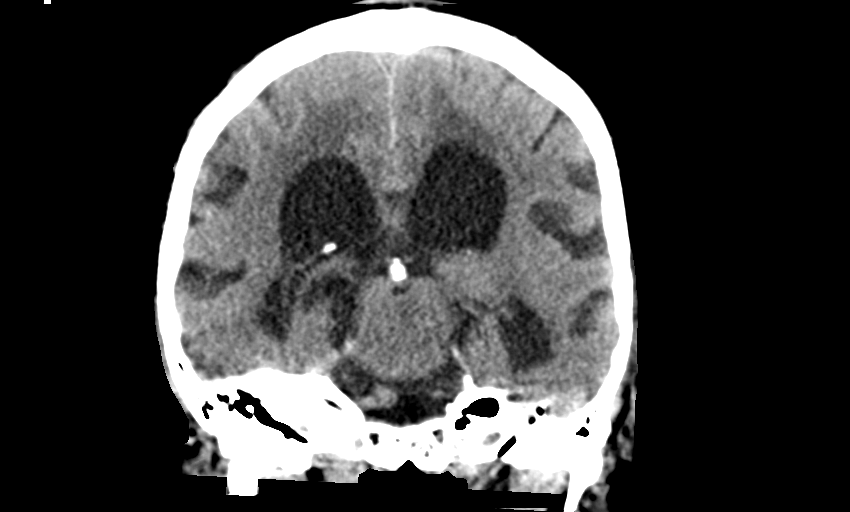
[im 37/67  brain]
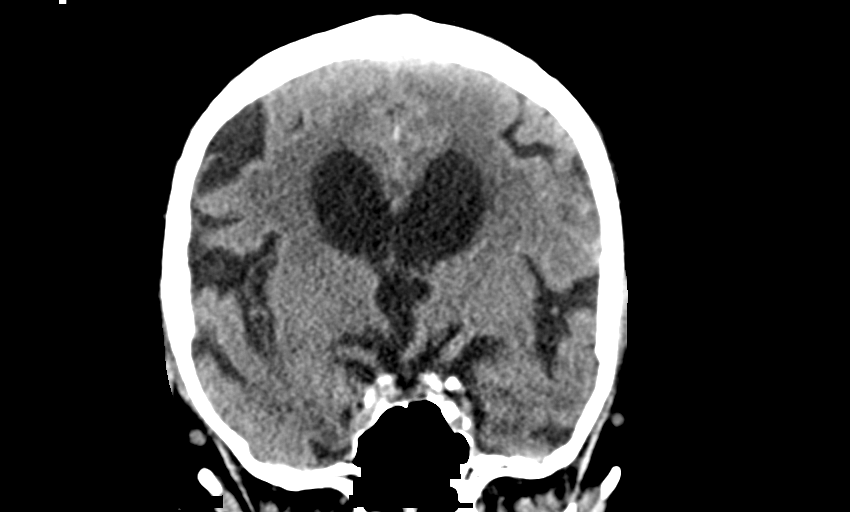

[Series 6: head without sag · sagittal · non-contrast · 0.30mm/px · 3 of 67 slices shown]
[im 23/67  brain]
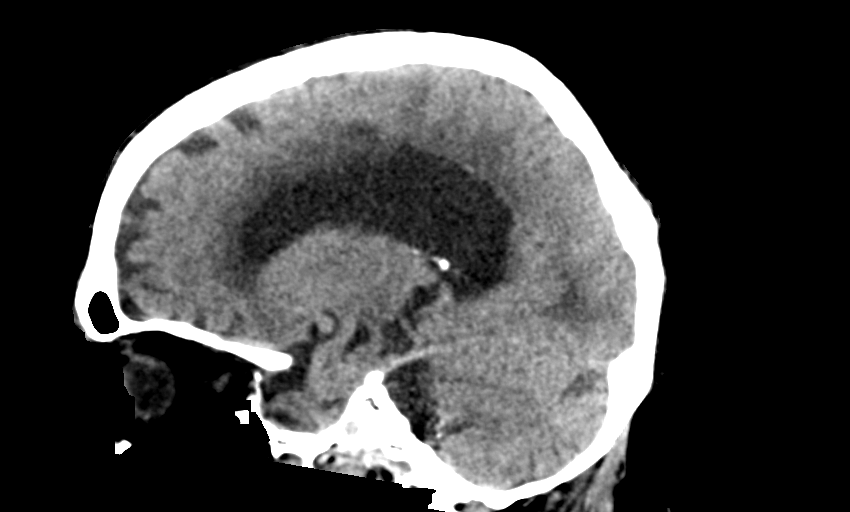
[im 34/67  brain]
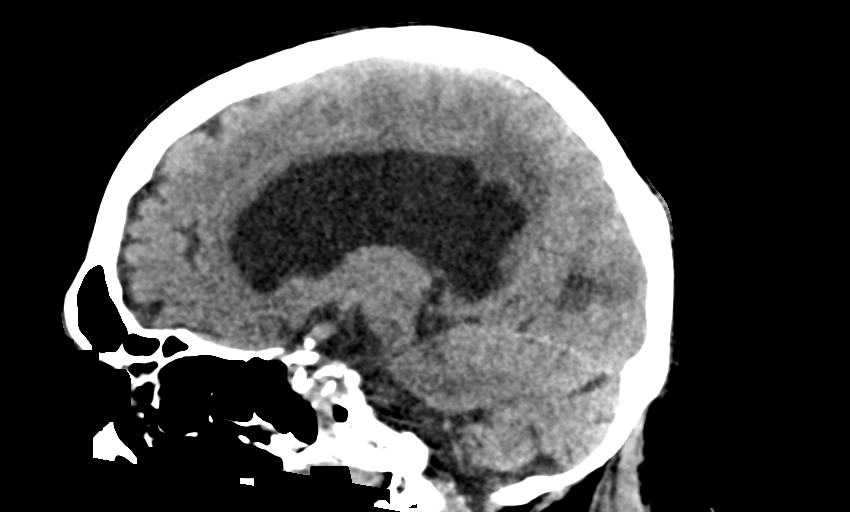
[im 45/67  brain]
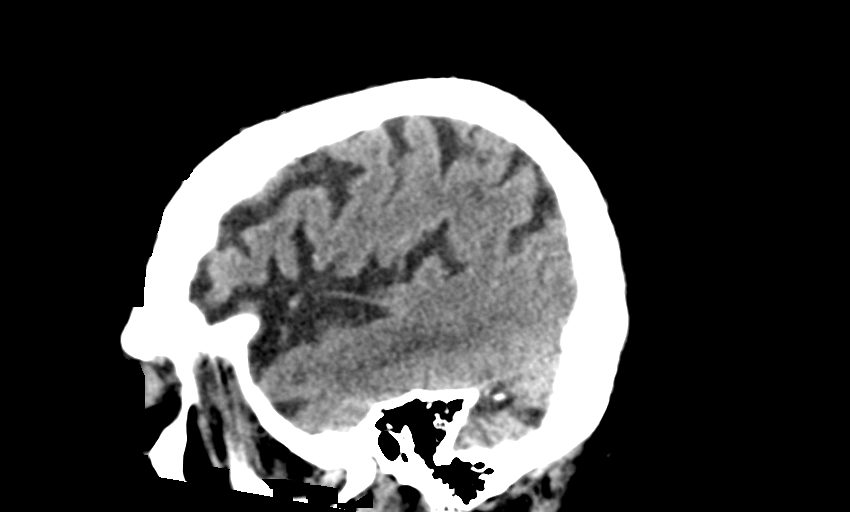

[13 of 47 positions shown; findings below may reference images not displayed]

FINDINGS: Brain: No evidence of acute large vascular territory infarction,
hemorrhage, extra-axial collection or mass lesion/mass effect.
Similar diffuse atrophy. Similar ventriculomegaly. Crowding of sulci
at the vertex. Somewhat acute callosum angle. Mild patchy white
matter hypoattenuation, similar.

Vascular: No hyperdense vessel identified. Calcific intracranial
atherosclerosis.

Skull: No acute fracture.

Sinuses/Orbits: Visualized sinuses are clear.

Other: No mastoid effusions.
IMPRESSION: 1. No evidence of acute intracranial abnormality.
2. Similar atrophy, probably ex vacuo in etiology given cerebral
atrophy. Normal pressure hydrocephalus (NPH) is a differential
consideration given acute callosal angle and sulcal crowding at the
vertex. Recommend correlation with the presence or absence of
signs/symptoms of NPH.

## 2021-01-15 MED ORDER — SODIUM CHLORIDE 0.9% FLUSH: 3.0000 mL | Freq: Once | INTRAVENOUS | Status: DC

## 2021-01-15 MED ORDER — VITAMIN B-12 100 MCG PO TABS
100.0000 ug | ORAL_TABLET | Freq: Every day | ORAL | Status: DC
  Administered 2021-01-16 – 2021-01-17 (×2): 100 ug via ORAL
  Filled 2021-01-15: qty 1

## 2021-01-15 MED ORDER — ACETAMINOPHEN 325 MG PO TABS: 650.0000 mg | ORAL_TABLET | ORAL | Status: DC | PRN

## 2021-01-15 MED ORDER — STROKE: EARLY STAGES OF RECOVERY BOOK
Freq: Once | Status: AC
  Filled 2021-01-15: qty 1

## 2021-01-15 MED ORDER — ADULT MULTIVITAMIN W/MINERALS CH
1.0000 | ORAL_TABLET | Freq: Every day | ORAL | Status: DC
  Administered 2021-01-16 – 2021-01-17 (×2): 1 via ORAL
  Filled 2021-01-15 (×2): qty 1

## 2021-01-15 MED ORDER — SENNOSIDES-DOCUSATE SODIUM 8.6-50 MG PO TABS: 1.0000 | ORAL_TABLET | Freq: Every evening | ORAL | Status: DC | PRN

## 2021-01-15 MED ORDER — PANTOPRAZOLE SODIUM 40 MG PO TBEC
40.0000 mg | DELAYED_RELEASE_TABLET | Freq: Every day | ORAL | Status: DC
  Administered 2021-01-16 – 2021-01-17 (×2): 40 mg via ORAL
  Filled 2021-01-15 (×2): qty 1

## 2021-01-15 MED ORDER — VITAMIN B-12 100 MCG PO TABS: 100.0000 ug | ORAL_TABLET | Freq: Every day | ORAL | Status: DC

## 2021-01-15 MED ORDER — PSYLLIUM 58.12 % PO PACK: 1.0000 | PACK | Freq: Every day | ORAL | Status: DC

## 2021-01-15 MED ORDER — ENOXAPARIN SODIUM 40 MG/0.4ML IJ SOSY
40.0000 mg | PREFILLED_SYRINGE | INTRAMUSCULAR | Status: DC
  Administered 2021-01-15: 40 mg via SUBCUTANEOUS
  Filled 2021-01-15: qty 0.4

## 2021-01-15 MED ORDER — PSYLLIUM 95 % PO PACK
1.0000 | PACK | Freq: Every day | ORAL | Status: DC
  Administered 2021-01-16 – 2021-01-17 (×2): 1 via ORAL
  Filled 2021-01-15 (×2): qty 1

## 2021-01-15 MED ORDER — ACETAMINOPHEN 160 MG/5ML PO SOLN: 650.0000 mg | ORAL | Status: DC | PRN

## 2021-01-15 MED ORDER — LEVALBUTEROL HCL 0.63 MG/3ML IN NEBU
0.6300 mg | INHALATION_SOLUTION | Freq: Four times a day (QID) | RESPIRATORY_TRACT | Status: DC | PRN
  Administered 2021-01-16: 0.63 mg via RESPIRATORY_TRACT
  Filled 2021-01-15: qty 3

## 2021-01-15 MED ORDER — ATORVASTATIN CALCIUM 40 MG PO TABS
40.0000 mg | ORAL_TABLET | Freq: Every day | ORAL | Status: DC
  Administered 2021-01-15 – 2021-01-17 (×3): 40 mg via ORAL
  Filled 2021-01-15 (×3): qty 1

## 2021-01-15 MED ORDER — METOPROLOL TARTRATE 5 MG/5ML IV SOLN: 5.0000 mg | Freq: Three times a day (TID) | INTRAVENOUS | Status: DC | PRN

## 2021-01-15 MED ORDER — ASPIRIN EC 81 MG PO TBEC
81.0000 mg | DELAYED_RELEASE_TABLET | Freq: Every day | ORAL | Status: DC
  Administered 2021-01-15 – 2021-01-16 (×2): 81 mg via ORAL
  Filled 2021-01-15 (×2): qty 1

## 2021-01-15 MED ORDER — ACETAMINOPHEN 650 MG RE SUPP: 650.0000 mg | RECTAL | Status: DC | PRN

## 2021-01-15 MED ORDER — IOHEXOL 350 MG/ML SOLN
100.0000 mL | Freq: Once | INTRAVENOUS | Status: AC | PRN
  Administered 2021-01-15: 100 mL via INTRAVENOUS

## 2021-01-15 MED ORDER — TAMSULOSIN HCL 0.4 MG PO CAPS
0.4000 mg | ORAL_CAPSULE | Freq: Every morning | ORAL | Status: DC
  Administered 2021-01-16 – 2021-01-17 (×2): 0.4 mg via ORAL
  Filled 2021-01-15 (×2): qty 1

## 2021-01-15 NOTE — ED Provider Notes (Signed)
[facility] EMERGENCY DEPARTMENT Provider Note   CSN: [id number] Arrival date & time: 01/15/21  1308     History Chief Complaint  Patient presents with   Transient Ischemic Attack    Ryan [name] is a 86 y.o. male.  The history is provided by the patient, medical records and a relative (daughter). The history is limited by the condition of the patient.  Neurologic Problem This is a new problem. The current episode started 6 to 12 hours ago. The problem occurs constantly. The problem has been gradually improving. Pertinent negatives include no chest pain, no abdominal pain, no headaches and no shortness of breath. Nothing aggravates the symptoms. Nothing relieves the symptoms. He has tried nothing for the symptoms. The treatment provided no relief.      Past Medical History:  Diagnosis Date   Alzheimer disease (HCC)    Benign prostatic hyperplasia 01/30/2020   Chronic anticoagulation    On eliquis   Gait disorder    Hypercholesterolemia    Hypertension    Mixed hyperlipidemia 01/30/2020   Paroxysmal atrial fibrillation [facility])     Patient Active Problem List   Diagnosis Date Noted   GI bleeding 12/26/2020   Prolonged QT interval 12/26/2020   PAF (paroxysmal atrial fibrillation) (HCC) 02/15/2020   Alzheimer disease (HCC) 02/15/2020   GI bleed 01/31/2020   Lower GI bleed    Primary hypertension    Acute GI bleeding 01/30/2020   Mixed hyperlipidemia 01/30/2020   Benign prostatic hyperplasia 01/30/2020   Anemia due to acute blood loss 01/30/2020   AF (paroxysmal atrial fibrillation) (HCC) 01/30/2020    Past Surgical History:  Procedure Laterality Date   BIOPSY  02/05/2020   Procedure: BIOPSY;  Surgeon: Lemar Lofty., MD;  Location: [facility] ENDOSCOPY;  Service: Gastroenterology;;   CHOLECYSTECTOMY     ESOPHAGOGASTRODUODENOSCOPY (EGD) WITH PROPOFOL N/A 02/05/2020   Procedure: ESOPHAGOGASTRODUODENOSCOPY (EGD) WITH PROPOFOL;  Surgeon: Lemar Lofty., MD;  Location: [facility] ENDOSCOPY;  Service: Gastroenterology;  Laterality: N/A;   FLEXIBLE SIGMOIDOSCOPY N/A 02/05/2020   Procedure: FLEXIBLE SIGMOIDOSCOPY;  Surgeon: Meridee Score Netty Starring., MD;  Location: [facility] ENDOSCOPY;  Service: Gastroenterology;  Laterality: N/A;   SAVORY DILATION N/A 02/05/2020   Procedure: SAVORY DILATION;  Surgeon: Meridee Score Netty Starring., MD;  Location: [facility] ENDOSCOPY;  Service: Gastroenterology;  Laterality: N/A;   TOTAL HIP ARTHROPLASTY         No family history on file.  Social History   Tobacco Use   Smoking status: Never   Smokeless tobacco: Never   Tobacco comments:    maybe in 20's  Substance Use Topics   Alcohol use: Not Currently   Drug use: Never    Home Medications Prior to Admission medications   Medication Sig Start Date End Date Taking? Authorizing Provider  atorvastatin (LIPITOR) 40 MG tablet Take 40 mg by mouth daily.    [provider]  Cyanocobalamin (VITAMIN B-12 PO) Take 1 tablet by mouth daily.    [provider]  metoprolol tartrate (LOPRESSOR) 25 MG tablet Take 0.5 tablets (12.5 mg total) by mouth 2 (two) times daily. 02/15/20 02/09/21  Christell Constant, MD  Multiple Vitamin (MULTIVITAMIN) tablet Take 1 tablet by mouth in the morning and at bedtime.    [provider]  pantoprazole (PROTONIX) 40 MG tablet TAKE 1 TABLET(40 MG) BY MOUTH TWICE DAILY Patient not taking: No sig reported 08/21/20   Mansouraty, Netty Starring., MD  polyethylene glycol (MIRALAX / GLYCOLAX) 17 g packet Take  17 g by mouth daily as needed for mild constipation. 02/14/20   [provider]  Probiotic Product (PROBIOTIC ADVANCED PO) Take 500 mg by mouth daily in the afternoon.    [provider]  Psyllium 58.12 % PACK Mix one packet in water and drink daily. 12/31/20 01/30/21  Amin, Jeanella Flattery, MD  tamsulosin (FLOMAX) 0.4 MG CAPS capsule Take 0.4 mg by mouth daily after supper.     [provider]     Allergies    Patient has no known allergies.  Review of Systems   Review of Systems  Constitutional:  Negative for chills, diaphoresis, fatigue and fever.  HENT:  Negative for congestion.   Eyes:  Negative for visual disturbance.  Respiratory:  Negative for cough, chest tightness and shortness of breath.   Cardiovascular:  Negative for chest pain, palpitations and leg swelling.  Gastrointestinal:  Negative for abdominal pain, constipation, diarrhea, nausea and vomiting.  Genitourinary:  Negative for dysuria, flank pain and frequency.  Musculoskeletal:  Negative for back pain, neck pain and neck stiffness.  Skin:  Negative for rash and wound.  Neurological:  Positive for speech difficulty and weakness (per EMS report). Negative for dizziness, light-headedness and headaches.  Psychiatric/Behavioral:  Positive for confusion. Negative for agitation.   All other systems reviewed and are negative.  Physical Exam Updated Vital Signs BP (!) 157/102 (BP Location: Right Arm)   Pulse 98   Temp 97.6 F (36.4 C) (Oral)   Resp 16   SpO2 97%   Physical Exam Vitals and nursing note reviewed.  Constitutional:      General: He is not in acute distress.    Appearance: He is well-developed. He is not ill-appearing, toxic-appearing or diaphoretic.  HENT:     Head: Normocephalic and atraumatic.     Nose: No congestion or rhinorrhea.     Mouth/Throat:     Mouth: Mucous membranes are moist.     Pharynx: No oropharyngeal exudate or posterior oropharyngeal erythema.  Eyes:     Extraocular Movements: Extraocular movements intact.     Conjunctiva/sclera: Conjunctivae normal.     Pupils: Pupils are equal, round, and reactive to light.  Cardiovascular:     Rate and Rhythm: Normal rate and regular rhythm.     Heart sounds: No murmur heard. Pulmonary:     Effort: Pulmonary effort is normal. No respiratory distress.     Breath sounds: Normal breath sounds. No wheezing, rhonchi or rales.  Chest:      Chest wall: No tenderness.  Abdominal:     General: Abdomen is flat.     Palpations: Abdomen is soft.     Tenderness: There is no abdominal tenderness. There is no right CVA tenderness, left CVA tenderness, guarding or rebound.  Musculoskeletal:        General: No tenderness.     Cervical back: Neck supple. No tenderness.  Skin:    General: Skin is warm and dry.     Findings: No erythema.  Neurological:     Mental Status: He is alert. He is confused.     GCS: GCS eye subscore is 3. GCS verbal subscore is 4. GCS motor subscore is 6.     Cranial Nerves: No facial asymmetry.     Sensory: No sensory deficit.     Motor: No weakness, abnormal muscle tone, seizure activity or pronator drift.     Coordination: Finger-Nose-Finger Test abnormal.     Comments: Patient has waxing and waning aphasia and  has difficulty with finger-nose-finger testing and following commands.  Psychiatric:        Mood and Affect: Mood normal.    ED Results / Procedures / Treatments   Labs (all labs ordered are listed, but only abnormal results are displayed) Labs Reviewed  CBC - Abnormal; Notable for the following components:      Result Value   RBC 3.87 (*)    Hemoglobin 10.6 (*)    HCT 33.3 (*)    All other components within normal limits  COMPREHENSIVE METABOLIC PANEL - Abnormal; Notable for the following components:   Glucose, Bld 103 (*)    Calcium 8.5 (*)    All other components within normal limits  I-STAT CHEM 8, ED - Abnormal; Notable for the following components:   Hemoglobin 11.2 (*)    HCT 33.0 (*)    All other components within normal limits  PROTIME-INR  APTT  DIFFERENTIAL  CBG MONITORING, ED    EKG EKG Interpretation  Date/Time:  Tuesday January 15 2021 13:34:12 EST Ventricular Rate:  74 PR Interval:    QRS Duration: 90 QT Interval:  414 QTC Calculation: 459 R Axis:   44 Text Interpretation: Atrial fibrillation Abnormal ECG No significant change since last tracing  Confirmed by Aletta Edouard [phone]) on 01/15/2021 1:35:34 PM  Radiology CT HEAD WO CONTRAST  Result Date: 01/15/2021 CLINICAL DATA:  Transient ischemic attack (TIA) EXAM: CT HEAD WITHOUT CONTRAST TECHNIQUE: Contiguous axial images were obtained from the base of the skull through the vertex without intravenous contrast. COMPARISON:  CT 10/17/2020. FINDINGS: Brain: No evidence of acute large vascular territory infarction, hemorrhage, extra-axial collection or mass lesion/mass effect. Similar diffuse atrophy. Similar ventriculomegaly. Crowding of sulci at the vertex. Somewhat acute callosum angle. Mild patchy white matter hypoattenuation, similar. Vascular: No hyperdense vessel identified. Calcific intracranial atherosclerosis. Skull: No acute fracture. Sinuses/Orbits: Visualized sinuses are clear. Other: No mastoid effusions. IMPRESSION: 1. No evidence of acute intracranial abnormality. 2. Similar atrophy, probably ex vacuo in etiology given cerebral atrophy. Normal pressure hydrocephalus (NPH) is a differential consideration given acute callosal angle and sulcal crowding at the vertex. Recommend correlation with the presence or absence of signs/symptoms of NPH. Electronically Signed   By: Margaretha Sheffield M.D.   On: 01/15/2021 16:38    Procedures Procedures   Medications Ordered in ED Medications  sodium chloride flush (NS) 0.9 % injection 3 mL (3 mLs Intravenous Not Given 01/15/21 1649)  iohexol (OMNIPAQUE) 350 MG/ML injection 100 mL (100 mLs Intravenous Contrast Given 01/15/21 1728)    ED Course  I have reviewed the triage vital signs and the nursing notes.  Pertinent labs & imaging results that were available during my care of the patient were reviewed by me and considered in my medical decision making (see chart for details).    MDM Rules/Calculators/A&P                           TARENCE STUEDEMANN is a 86 y.o. male with a past medical history significant for paroxysmal atrial  fibrillation now not on blood thinners due to recent GI bleed, hyperlipidemia, hypertension, and Alzheimer's who presents with neurologic deficits.  According to daughter, when she saw him about 11 AM this morning, she noticed that he was not talking normally and also appeared altered.  He was not moving everything like he should and per EMS, patient had a right arm weakness with aphasia that resolved as they were bringing  him to the emergency department.  Per daughter, he is still not back to his mental status and speech baseline especially when trying to follow commands with finger-nose-finger testing bilaterally.  She reports that he was reportedly normal at 10 AM when he went to a haircut, this was likely not known by EMS or the initial evaluation team.  Per daughter, patient has been doing well recently and denied recent falls, fevers, chills congestion, cough, or other GI symptoms.  No reported urinary changes recently.  Patient is able answer some questions intermittently but denies other complaints other than feeling weird.  He denies headache or vision changes.  On my exam, he does not have facial droop or unilateral weakness but he did have some difficulty following commands and finger-nose-finger testing.  His legs had normal strength and sensation as did his arms.  No carotid bruit appreciated initially.  Lungs clear and chest nontender.  Abdomen nontender.  Pupils are symmetric and reactive and he had a symmetric smile.  I quickly discussed with neurology now that a new timeline has been provided where he was last normal at 10 AM.  With the waxing waning speech abnormalities the daughter reports he is still not at baseline for and some of the difficulty with coordination, and the report that he had a right arm droop initially, neurology reports it is reasonable to call a code stroke to get CTA and CT perfusion.  Code stroke was activated.  Anticipate disposition based on neurology  recommendations.  5:38 PM Neurology says he is improving back to his baseline.  They did not see acute large vessel occlusion on the CTA.  They do feel he likely had a significant TIA and given his recent GI bleed and discussions about starting anticoagulation again, I do feel he would benefit from admission to the hospital.  MRI was recommended and we will obtain that.  Patient will be admitted for further management of TIA.   Final Clinical Impression(s) / ED Diagnoses Final diagnoses:  TIA (transient ischemic attack)  Transient speech disturbance    Clinical Impression: 1. TIA (transient ischemic attack)   2. Transient speech disturbance     Disposition: Admit  This note was prepared with assistance of Dragon voice recognition software. Occasional wrong-word or sound-a-like substitutions may have occurred due to the inherent limitations of voice recognition software.     Shannon Kirkendall, Gwenyth Allegra, MD 01/15/21 2236

## 2021-01-15 NOTE — ED Notes (Signed)
Pt remains in MRI 

## 2021-01-15 NOTE — ED Notes (Signed)
MD aware of pt bp. No symptoms at this time. Will continue to monitor.

## 2021-01-15 NOTE — H&P (Signed)
History and Physical    Ryan [name] [id number] DOB: March 13, 1934 DOA: 01/15/2021  PCP: Lajean Manes, MD Consultants:   Patient coming from:  [facility]  Chief Complaint: dysarthric speech and aphasia   HPI: Ryan [name] is a 86 y.o. male with medical history significant of  Alzheimer's dementia, hypertension, HLD, BPH, PAF on Eliquis who lives at a SNF, just moved to memory care one week ago. History is from daughter. She went to see him around 11:20 for lunch and he was "not right." He was slumped over in a chair and when he started talking it was all "jibberish." It was unintelligible speech. No noticeable facial droop. She checked with nurse and they called EMS. She also feels like he is more confused than normal, but hard to say with his alzheimer's.   Per EMS had right arm weakness improved when he got to ED, but still having aphasia/dysarthria.   Recently admitted on 10/25 and discharged on 10/31 for GI bleed that was felt to be diverticular in origin.  Eliquis was held with plans to follow-up with PCP and discuss resuming at that time.  Last day of Augmentin was January 02, 2021. They followed up with PCP last week and they continued to hold due to low hemoglobin.   Baseline he at times does not know his daughters. He can feed himself. Needs help with ADLs, incontinent.   No fever or chills, complaints of headache, chest pain or palpitations, shortness of breath or cough, denies wheezing, stomach pain, nausea vomiting diarrhea, admits to leg swelling denies dysuria or other urinary symptoms.  ED Course: vitals: Afebrile, blood pressure 171/121, heart rate 84, respiratory rate 16, oxygen 98% on room air. Pertinent labs: Hemoglobin 10.6, CT head: No acute intracranial abnormality. CTA head and neck: No large vessel occlusion or significant stenosis or evidence of dissection. In ED code stroke was called.  Neurology consulted and TRH was asked to admit.  Review of  Systems: As per HPI; otherwise review of systems reviewed and negative.   Ambulatory Status:  Ambulates with walker     Past Medical History:  Diagnosis Date   Alzheimer disease ([place])    Benign prostatic hyperplasia 01/30/2020   Chronic anticoagulation    On eliquis   Gait disorder    Hypercholesterolemia    Hypertension    Mixed hyperlipidemia 01/30/2020   Paroxysmal atrial fibrillation [facility])     Past Surgical History:  Procedure Laterality Date   BIOPSY  02/05/2020   Procedure: BIOPSY;  Surgeon: Irving Copas., MD;  Location: [facility] ENDOSCOPY;  Service: Gastroenterology;;   CHOLECYSTECTOMY     ESOPHAGOGASTRODUODENOSCOPY (EGD) WITH PROPOFOL N/A 02/05/2020   Procedure: ESOPHAGOGASTRODUODENOSCOPY (EGD) WITH PROPOFOL;  Surgeon: Irving Copas., MD;  Location: [place];  Service: Gastroenterology;  Laterality: N/A;   FLEXIBLE SIGMOIDOSCOPY N/A 02/05/2020   Procedure: FLEXIBLE SIGMOIDOSCOPY;  Surgeon: Rush Landmark Telford Nab., MD;  Location: [place];  Service: Gastroenterology;  Laterality: N/A;   SAVORY DILATION N/A 02/05/2020   Procedure: SAVORY DILATION;  Surgeon: Rush Landmark Telford Nab., MD;  Location: [place];  Service: Gastroenterology;  Laterality: N/A;   TOTAL HIP ARTHROPLASTY      Social History   Socioeconomic History   Marital status: Married    Spouse name: Not on file   Number of children: 3   Years of education: Not on file   Highest education level: High school graduate  Occupational History    Comment: retired  Tobacco Use   Smoking  status: Never   Smokeless tobacco: Never   Tobacco comments:    maybe in 20's  Substance and Sexual Activity   Alcohol use: Not Currently   Drug use: Never   Sexual activity: Not on file  Other Topics Concern   Not on file  Social History Narrative   01/17/20 lives with wife in home   Social Determinants of Health   Financial Resource Strain: Not on file  Food Insecurity: Not on file   Transportation Needs: Not on file  Physical Activity: Not on file  Stress: Not on file  Social Connections: Not on file  Intimate Partner Violence: Not on file    No Known Allergies  No family history on file.  Prior to Admission medications   Medication Sig Start Date End Date Taking? Authorizing Provider  atorvastatin (LIPITOR) 40 MG tablet Take 40 mg by mouth daily.    [provider]  Cyanocobalamin (VITAMIN B-12 PO) Take 1 tablet by mouth daily.    [provider]  metoprolol tartrate (LOPRESSOR) 25 MG tablet Take 0.5 tablets (12.5 mg total) by mouth 2 (two) times daily. 02/15/20 02/09/21  Christell Constant, MD  Multiple Vitamin (MULTIVITAMIN) tablet Take 1 tablet by mouth in the morning and at bedtime.    [provider]  pantoprazole (PROTONIX) 40 MG tablet TAKE 1 TABLET(40 MG) BY MOUTH TWICE DAILY 08/21/20   Mansouraty, Netty Starring., MD  polyethylene glycol (MIRALAX / GLYCOLAX) 17 g packet Take 17 g by mouth daily as needed for mild constipation. 02/14/20   [provider]  Probiotic Product (PROBIOTIC ADVANCED PO) Take 500 mg by mouth daily in the afternoon.    [provider]  Psyllium 58.12 % PACK Mix one packet in water and drink daily. 12/31/20 01/30/21  Amin, Loura Halt, MD  tamsulosin (FLOMAX) 0.4 MG CAPS capsule Take 0.4 mg by mouth daily after supper.     [provider]    Physical Exam: Vitals:   01/15/21 1700 01/15/21 1715 01/15/21 1730 01/15/21 1810  BP: (!) 179/94 (!) 168/92 (!) 168/109 (!) 170/107  Pulse: (!) 36 96 93 90  Resp: 13 14 14 18   Temp:      TempSrc:      SpO2: 98% 96% 97% 100%     General:  Appears calm and comfortable and is in NAD Eyes:  PERRL, EOMI, normal lids, iris ENT:  grossly normal hearing, lips & tongue, mmm; appropriate dentition Neck:  no LAD, masses or thyromegaly; no carotid bruits Cardiovascular:  irregularly irregular, no m/r/g. Bilateral LE edema to knee. R>L, some  erythema.  Respiratory:   occasional expiratory wheeze. Crackles in bilateral lower lobes, R>L  Normal respiratory effort. Abdomen:  soft, NT, ND, NABS Back:   normal alignment, no CVAT Skin:  no rash or induration seen on limited exam Musculoskeletal:  grossly normal tone BUE/BLE, good ROM, no bony abnormality Lower extremity:    Limited foot exam with no ulcerations.  2+ distal pulses. Psychiatric:  grossly normal mood and affect, speech fluent and appropriate, AOx3 Neurologic:  CN 2-12 grossly intact, moves all extremities in coordinated fashion, sensation intact. Finger to nose, slow but intact. Heel to shin intact bilaterally. DTR 2+. Gait deferred     Radiological Exams on Admission: Independently reviewed - see discussion in A/P where applicable  CT HEAD WO CONTRAST  Result Date: 01/15/2021 CLINICAL DATA:  Transient ischemic attack (TIA) EXAM: CT HEAD WITHOUT CONTRAST TECHNIQUE: Contiguous axial images were obtained from the  base of the skull through the vertex without intravenous contrast. COMPARISON:  CT 10/17/2020. FINDINGS: Brain: No evidence of acute large vascular territory infarction, hemorrhage, extra-axial collection or mass lesion/mass effect. Similar diffuse atrophy. Similar ventriculomegaly. Crowding of sulci at the vertex. Somewhat acute callosum angle. Mild patchy white matter hypoattenuation, similar. Vascular: No hyperdense vessel identified. Calcific intracranial atherosclerosis. Skull: No acute fracture. Sinuses/Orbits: Visualized sinuses are clear. Other: No mastoid effusions. IMPRESSION: 1. No evidence of acute intracranial abnormality. 2. Similar atrophy, probably ex vacuo in etiology given cerebral atrophy. Normal pressure hydrocephalus (NPH) is a differential consideration given acute callosal angle and sulcal crowding at the vertex. Recommend correlation with the presence or absence of signs/symptoms of NPH. Electronically Signed   By: Margaretha Sheffield M.D.   On:  01/15/2021 16:38   CT ANGIO HEAD NECK W WO CM W PERF (CODE STROKE)  Result Date: 01/15/2021 CLINICAL DATA:  Stroke/TIA, assess extracranial arteries; slurred speech, right arm weakness EXAM: CT ANGIOGRAPHY HEAD AND NECK CT PERFUSION BRAIN TECHNIQUE: Multidetector CT imaging of the head and neck was performed using the standard protocol during bolus administration of intravenous contrast. Multiplanar CT image reconstructions and MIPs were obtained to evaluate the vascular anatomy. Carotid stenosis measurements (when applicable) are obtained utilizing NASCET criteria, using the distal internal carotid diameter as the denominator. Multiphase CT imaging of the brain was performed following IV bolus contrast injection. Subsequent parametric perfusion maps were calculated using RAPID software. CONTRAST:  148mL OMNIPAQUE IOHEXOL 350 MG/ML SOLN COMPARISON:  None. FINDINGS: CTA NECK Aortic arch: Great vessel origins are patent with mild calcified plaque. Right carotid system: Patent. Retropharyngeal course. Mild calcified plaque along the proximal ICA. Left carotid system: Patent. Retropharyngeal course. Trace calcified plaque at the ICA origin. Vertebral arteries: Patent. Left vertebral artery slightly dominant. No stenosis. Skeleton: Cervical spine degenerative changes. Other neck: Thyroid nodules measuring up to 11 mm. No ultrasound follow-up is recommended by current guidelines. Upper chest: No apical lung masses. Review of the MIP images confirms the above findings CTA HEAD Anterior circulation: Intracranial internal carotid arteries are patent with mild calcified plaque. Anterior and middle cerebral arteries are patent. Posterior circulation: Intracranial vertebral arteries are patent. Basilar artery is patent. Major cerebellar artery origins are patent. Posterior cerebral arteries are patent. Venous sinuses: Patent as allowed by contrast bolus timing. Review of the MIP images confirms the above findings CT Brain  Perfusion Findings: CBF (<30%) Volume: 48mL Perfusion (Tmax>6.0s) volume: 32mL Mismatch Volume: 33mL Infarction Location: None. IMPRESSION: No large vessel occlusion, hemodynamically significant stenosis, or evidence of dissection. Perfusion imaging demonstrates no evidence of core infarction or penumbra. Electronically Signed   By: Macy Mis M.D.   On: 01/15/2021 17:39    EKG: Independently reviewed.  Afib  with rate 74; nonspecific ST changes with no evidence of acute ischemia   Labs on Admission: I have personally reviewed the available labs and imaging studies at the time of the admission.  Pertinent labs:  Hgb 10.6    Assessment/Plan Active Problems:   TIA (transient ischemic attack) -86 year old male presenting with acute onset dysarthria and right upper extremity weakness with risk factors for stroke including hypertension, age, hyperlipidemia, paroxysmal atrial fibrillation off anticoagulation due to recent GI bleed. -Concern for TIA versus stroke, right upper extremity weakness has resolved and speech has improved significantly. -place in observation on telemetry for TIA/stroke work-up -Neurochecks per protocol -Neurology consulted -MRI brain without contrast ordered as well as echo -fasting lipid panel, a1c and tsh.  -Permissive  hypertension first 24 hours <220/110 -N.p.o. until bedside swallow screen -PT/ OT/ SLP consult -also checking ammonia/b12/tsh and UA for possible increased confusion from baseline.  -ASA 81mg . Had long discussion about anti coagulation with family. Neurology would like GI recommendation about anticoagulation     AF (paroxysmal atrial fibrillation) ([place]) -rate controlled, place on tele -discussion with family about risks vs. Benefits of anticoagulation. Neurology would like GI recommendation and family still unsure with hgb of 10.6 and recent GI bleed.  -beta blocker held tonight to allow for permissive htn.  -CHA2DS2-VASc score with TIA is 5.    Bilateral LE edema, R>L New onset over last few days Bilateral dopplers since off eliquis   Wheezing Check CXR Xoponex prn   Acute blood loss anemia Hgb has remained stable over past 2 weeks. 10.1>10.6 today  Saw PCP who did not want to start back DOAC since still anemic, even though stable.  -neurology wanting GI input for anticoagulation risks    Primary hypertension -allow for permissive HTN. On lopressor likely for rate control, will hold tonight and prn parameters written    Mixed hyperlipidemia -Fasting lipid panel pending for a.m. -Continue Lipitor 40 mg daily- goal LDL less than 70    Alzheimer disease ([place]) -delirium precautions    Benign prostatic hyperplasia -Continue Flomax    There is no height or weight on file to calculate BMI.   Level of care: Telemetry Medical DVT prophylaxis:  Lovenox  Code Status:  DNR-confirmed with family  Family Communication: daughters Jackson Latino and Kai Levins at bedside   Disposition Plan:  The patient is from: SNF  Anticipated d/c is to: SNF Placed  in observation as anticipate less than 2 midnight stay. Requires hospitalization for TIA vs.stroke w/u, monitoring and decision making with specialists.   Patient is currently: stable  Consults called: neurology  Admission status:  observation    Orma Flaming MD [facility]   How to contact the [facility] Attending or Consulting provider [place] or covering provider during after hours [place], for this patient?  Check the care team in [facility] and look for a) attending/consulting [facility] provider listed and b) the [facility] team listed Log into [email or web address] and use [place]'s universal password to access. If you do not have the password, please contact the hospital operator. Locate the [facility] provider you are looking for under [facility] and page to a number that you can be directly reached. If you still have difficulty reaching the provider, please page the [facility] (Director  on Call) for the Hospitalists listed on amion for assistance.   01/15/2021, 6:15 PM

## 2021-01-15 NOTE — ED Notes (Signed)
Taken to MRI by transporter.  °

## 2021-01-15 NOTE — Code Documentation (Signed)
Stroke Response Nurse Documentation Code Documentation  THEOPHIL THIVIERGE is a 86 y.o. male arriving to [facility] ED via [facility] on 01/15/21 with past medical hx of HTN, HLD, Alzheimer's, afib. On No antithrombotic at this time due to recent GI bleed. Code stroke was activated by EMS.   Patient from [place] where he was LKW at 1000 and now complaining of confusion and difficulty speaking. Patient's daughter was visiting him and noticed he was unable to speak normally and having difficulty getting out of his chair.  Labs drawn and patient cleared for CT by Dr. Sherry Ruffing. Patient to CT where team met patient. NIHSS 3, see documentation for details and code stroke times. Patient improving at this time. Patient with disoriented, Expressive aphasia , and dysarthria  on exam.   The following imaging was completed:  CT (done prior to code called), CTA head and neck, CTP. Patient is not a candidate for IV Thrombolytic due to out of window/recent GI bleed. Patient is not a candidate for IR due to no large vessel occlusion.   Care/Plan: q2h vitals and NIHSS, MRI.  Bedside handoff with ED RN Wendelyn Breslow.    Candace Cruise K  Stroke Response RN

## 2021-01-15 NOTE — ED Triage Notes (Signed)
Pt arrives via [facility] from [facility]. EMS reports daughter was visiting this morning and noted pt to be having stroke like symptoms including slurred speech, gait abnormality, R arm weakness. Fire dept first on scene reported same symptoms, when EMS arrived pt was back at baseline. Unknown last known well, possibly last night before bed. Pt with hx of dementia. A+Ox1 (self) at triage.    EMS last VS - 150/94, HR 70 (afib), 97% on RA, CBG 94.

## 2021-01-15 NOTE — Consult Note (Signed)
Neurology Consultation Reason for Consult: Aphasia Referring Physician: Tegeler, C  CC: Aphasia  History is obtained from: Patient, daughter  HPI: Ryan [name] is a 86 y.o. male with a history of significant dementia, incontinent at baseline and needs assistance with most ADLs.  He is able to walk with a walker at times.  Today, he got a haircut around 10 AM and was presumably normal at the time.  Subsequently, he developed difficulty speaking though unclear exactly when.  His daughter found him sometime around noon and he was brought in by EMS, but at that time it was thought to be an unclear time of onset.  It was only once family contacted the hairdresser much later in the day that it was known to be within the timeframe for a code stroke and therefore a code stroke was activated later in the day.  Of note, during EMS transport they did report right-sided weakness.   LKW: 10 AM tpa given?: no, recent GI bleed Premorbid modified rankin scale: 4     ROS:  Unable to obtain due to altered mental status.   Past Medical History:  Diagnosis Date   Alzheimer disease ([place])    Benign prostatic hyperplasia 01/30/2020   Chronic anticoagulation    On eliquis   Gait disorder    Hypercholesterolemia    Hypertension    Mixed hyperlipidemia 01/30/2020   Paroxysmal atrial fibrillation ([place])     Family history: Unable to obtain due to altered mental status.   Social History:  reports that he has never smoked. He has never used smokeless tobacco. He reports that he does not currently use alcohol. He reports that he does not use drugs.   Exam: Current vital signs: BP (!) 171/108   Pulse 73   Temp 97.6 F (36.4 C) (Oral)   Resp 14   SpO2 96%  Vital signs in last 24 hours: Temp:  [97.6 F (36.4 C)] 97.6 F (36.4 C) (11/15 1317) Pulse Rate:  [36-98] 73 (11/15 1830) Resp:  [13-18] 14 (11/15 1830) BP: (157-179)/(92-121) 171/108 (11/15 1830) SpO2:  [96 %-100 %] 96 % (11/15  1830)   Physical Exam  Constitutional: Appears well-developed and well-nourished.  Psych: Affect appropriate to situation Eyes: No scleral injection HENT: No OP obstruction MSK: no joint deformities.  Cardiovascular: Normal rate and regular rhythm.  Respiratory: Effort normal, non-labored breathing GI: Soft.  No distension. There is no tenderness.  Skin: WDI  Neuro: Mental Status: Patient is awake, alert, initially he has some degree of expressive aphasia, but this improved over the course of my exam.  He was dysarthric.  He gets his age but not the month. Cranial Nerves: II: Visual Fields are full. Pupils are equal, round, and reactive to light.   III,IV, VI: EOMI without ptosis or diploplia.  V: Facial sensation is symmetric to temperature VII: Facial movement is symmetric.  VIII: hearing is intact to voice X: Uvula elevates symmetrically XI: Shoulder shrug is symmetric. XII: tongue is midline without atrophy or fasciculations.  Motor: Tone is normal. Bulk is normal. 5/5 strength was present in all four extremities.  Sensory: Sensation is symmetric to light touch and temperature in the arms and legs. Deep Tendon Reflexes: 2+ and symmetric in the biceps and patellae.  Plantars: Toes are downgoing bilaterally.  Cerebellar: FNF and HKS are intact bilaterally      I have reviewed labs in epic and the results pertinent to this consultation are: CMP is unremarkable, creatinine is 1.18  I have reviewed the images obtained: CT/CTA/CTP-negative  Impression: 86 year old male with acute aphasia/right arm weakness which has been steadily improving over the course of the day.  This occurred in the setting of atrial fibrillation for which anticoagulation has been held due to recent GI bleed.  I do suspect that this represents small ischemic infarct versus TIA given that he is essentially back to baseline at this point.  He will be admitted for TIA work-up, I would favor getting GI  to comment on risk associated with anticoagulation.  Recommendations: - HgbA1c, fasting lipid panel - MRI of the brain without contrast - Frequent neuro checks - Echocardiogram - Prophylactic therapy-Antiplatelet med: Aspirin - dose 81mg  daily, consider anticoagulation if okay by GI - Risk factor modification - Telemetry monitoring - PT consult, OT consult, Speech consult - Stroke team to follow  , MD [facility] [phone]  If 7pm- 7am, please page neurology on call as listed in AMION.

## 2021-01-15 NOTE — ED Provider Notes (Signed)
Emergency Medicine Provider Triage Evaluation Note  Ryan [name] , a 86 y.o. male  was evaluated in triage.  Pt complains of strokelike symptoms starting several hours ago.  Daughter bedside states that she was visiting patient at his nursing facility, and he was noted to have slurred speech, gait abnormality, and right arm weakness.  When the fire department arrived on scene they reported the same symptoms, but when EMS arrived they reported patient was back at baseline.  Unknown last well, possibly last night before bed.  Daughter states patient has a history of dementia, and overall states that he seems more confused today.  Review of Systems  Positive: Confusion, weakness Negative: Headache, chest pain, shortness of breath  Physical Exam  BP (!) 171/121 (BP Location: Right Arm)   Pulse 84   Temp 97.6 F (36.4 C) (Oral)   Resp 16   SpO2 98%  Gen:   Awake, no distress   Resp:  Normal effort  MSK:   Moves extremities without difficulty  Other:  Alert to self, 5/5 strength in all extremities, neuro exam grossly intact  Medical Decision Making  Medically screening exam initiated at 1:19 PM.  Appropriate orders placed.  Delfina Redwood was informed that the remainder of the evaluation will be completed by another provider, this initial triage assessment does not replace that evaluation, and the importance of remaining in the ED until their evaluation is complete.     Jeanella Flattery 01/15/21 1339    Terrilee Files, MD 01/16/21 1239

## 2021-01-16 ENCOUNTER — Observation Stay (HOSPITAL_BASED_OUTPATIENT_CLINIC_OR_DEPARTMENT_OTHER): Payer: Medicare Other

## 2021-01-16 DIAGNOSIS — R609 Edema, unspecified: Secondary | ICD-10-CM

## 2021-01-16 DIAGNOSIS — K922 Gastrointestinal hemorrhage, unspecified: Secondary | ICD-10-CM

## 2021-01-16 DIAGNOSIS — I10 Essential (primary) hypertension: Secondary | ICD-10-CM | POA: Diagnosis not present

## 2021-01-16 DIAGNOSIS — G309 Alzheimer's disease, unspecified: Secondary | ICD-10-CM

## 2021-01-16 DIAGNOSIS — F0280 Dementia in other diseases classified elsewhere without behavioral disturbance: Secondary | ICD-10-CM

## 2021-01-16 DIAGNOSIS — I480 Paroxysmal atrial fibrillation: Secondary | ICD-10-CM | POA: Diagnosis not present

## 2021-01-16 DIAGNOSIS — E782 Mixed hyperlipidemia: Secondary | ICD-10-CM

## 2021-01-16 DIAGNOSIS — G459 Transient cerebral ischemic attack, unspecified: Secondary | ICD-10-CM | POA: Diagnosis not present

## 2021-01-16 LAB — LIPID PANEL
Cholesterol: 111 mg/dL (ref 0–200)
HDL: 64 mg/dL (ref >40)
LDL Cholesterol: 38 mg/dL (ref 0–99)
Total CHOL/HDL Ratio: 1.7 RATIO
Triglycerides: 43 mg/dL (ref <150)
VLDL: 9 mg/dL (ref 0–40)

## 2021-01-16 LAB — ECHOCARDIOGRAM COMPLETE
Area-P 1/2: 3.12 cm2
Calc EF: 50.6 %
S' Lateral: 3 cm
Single Plane A2C EF: 49.1 %
Single Plane A4C EF: 55.4 %

## 2021-01-16 LAB — CBC
HCT: 33.2 % — ABNORMAL LOW (ref 39.0–52.0)
Hemoglobin: 10.7 g/dL — ABNORMAL LOW (ref 13.0–17.0)
MCH: 27.4 pg (ref 26.0–34.0)
MCHC: 32.2 g/dL (ref 30.0–36.0)
MCV: 84.9 fL (ref 80.0–100.0)
Platelets: 164 10*3/uL (ref 150–400)
RBC: 3.91 MIL/uL — ABNORMAL LOW (ref 4.22–5.81)
RDW: 14.2 % (ref 11.5–15.5)
WBC: 5 10*3/uL (ref 4.0–10.5)
nRBC: 0 % (ref 0.0–0.2)

## 2021-01-16 LAB — HEMOGLOBIN A1C
Hgb A1c MFr Bld: 5.7 % — ABNORMAL HIGH (ref 4.8–5.6)
Mean Plasma Glucose: 116.89 mg/dL

## 2021-01-16 LAB — TSH: TSH: 1.349 u[IU]/mL (ref 0.350–4.500)

## 2021-01-16 MED ORDER — POTASSIUM CHLORIDE 10 MEQ/100ML IV SOLN
10.0000 meq | INTRAVENOUS | Status: AC
  Administered 2021-01-16 (×2): 10 meq via INTRAVENOUS
  Filled 2021-01-16 (×2): qty 100

## 2021-01-16 MED ORDER — MAGNESIUM SULFATE 2 GM/50ML IV SOLN
2.0000 g | Freq: Once | INTRAVENOUS | Status: AC
  Administered 2021-01-16: 2 g via INTRAVENOUS
  Filled 2021-01-16: qty 50

## 2021-01-16 MED ORDER — APIXABAN 5 MG PO TABS
5.0000 mg | ORAL_TABLET | Freq: Two times a day (BID) | ORAL | Status: DC
  Administered 2021-01-16 – 2021-01-17 (×2): 5 mg via ORAL
  Filled 2021-01-16 (×2): qty 1

## 2021-01-16 MED ORDER — POLYETHYLENE GLYCOL 3350 17 G PO PACK: 17.0000 g | PACK | Freq: Every day | ORAL | Status: DC | PRN

## 2021-01-16 MED ORDER — METOPROLOL TARTRATE 12.5 MG HALF TABLET
12.5000 mg | ORAL_TABLET | Freq: Two times a day (BID) | ORAL | Status: DC
  Administered 2021-01-16 (×2): 12.5 mg via ORAL
  Filled 2021-01-16 (×2): qty 1

## 2021-01-16 NOTE — TOC Initial Note (Signed)
Transition of Care [facility]) - Initial/Assessment Note    Patient Details  Name: Ryan [name] MRN: [id number] Date of Birth: Sep 18, 1934  Transition of Care [facility]) CM/SW Contact:    Kermit Balo, RN Phone Number: 01/16/2021, 2:40 PM  Clinical Narrative:                 Patient is from [facility]. Current plan is for patient to return when medically ready. Awaiting PT/OT evals. ToC following.  Expected Discharge Plan: Memory Care Barriers to Discharge: Continued Medical Work up   Patient Goals and CMS Choice   CMS Medicare.gov Compare Post Acute Care list provided to:: Legal Guardian    Expected Discharge Plan and Services Expected Discharge Plan: Memory Care   Discharge Planning Services: CM Consult   Living arrangements for the past 2 months: Assisted Living Facility                                      Prior Living Arrangements/Services Living arrangements for the past 2 months: Assisted Living Facility Lives with:: Facility Resident Patient language and need for interpreter reviewed:: Yes Do you feel safe going back to the place where you live?: Yes      Need for Family Participation in Patient Care: Yes (Comment) Care giver support system in place?: Yes (comment) Current home services: DME (walker/ transport chair) Criminal Activity/Legal Involvement Pertinent to Current Situation/Hospitalization: No - Comment as needed  Activities of Daily Living      Permission Sought/Granted                  Emotional Assessment Appearance:: Appears stated age     Orientation: : Oriented to Self   Psych Involvement: No (comment)  Admission diagnosis:  Wheezing [R06.2] TIA (transient ischemic attack) [G45.9] Transient speech disturbance [R47.9] Patient Active Problem List   Diagnosis Date Noted   TIA (transient ischemic attack) 01/15/2021   GI bleeding 12/26/2020   Prolonged QT interval 12/26/2020   PAF (paroxysmal atrial fibrillation)  (HCC) 02/15/2020   Alzheimer disease (HCC) 02/15/2020   GI bleed 01/31/2020   Lower GI bleed    Primary hypertension    Acute GI bleeding 01/30/2020   Mixed hyperlipidemia 01/30/2020   Benign prostatic hyperplasia 01/30/2020   Anemia due to acute blood loss 01/30/2020   AF (paroxysmal atrial fibrillation) (HCC) 01/30/2020   PCP:  [name], MD Pharmacy:   [facility] [phone] Ginette Otto, [place] - [address] AT [facility] [address] & [facility] [address] [place] [address] Phone: [phone] Fax: [phone]     Social Determinants of Health (SDOH) Interventions    Readmission Risk Interventions Readmission Risk Prevention Plan 02/06/2020  Post Dischage Appt Complete  Medication Screening Complete  Transportation Screening Complete  Some recent data might be hidden

## 2021-01-16 NOTE — Progress Notes (Signed)
  Echocardiogram 2D Echocardiogram has been performed.  Ryan [name] 01/16/2021, 2:09 PM

## 2021-01-16 NOTE — ED Notes (Signed)
Pt had episode of nonsustained vtach x2. Pt not complaining of any chest pain at this time and is back in afib on the monitor. MD paged and new orders placed.

## 2021-01-16 NOTE — Progress Notes (Addendum)
PROGRESS NOTE        PATIENT DETAILS Name: Ryan [name] Age: 86 y.o. Sex: male Date of Birth: [birth year] Admit Date: 01/15/2021 Admitting Physician Orma Flaming, MD [id number], Christiane Ha, MD  Brief Narrative: Patient is a 86 y.o. male with history of advanced dementia living in a memory care unit, PAF not on anticoagulation due to recent diverticular bleeding-presented with transient dysarthria and RUE weakness.  Felt to have TIA-see below for further details.  Subjective: Lying comfortably in bed-denies any chest pain or shortness of breath.  Speech is back to normal.  He is very pleasantly confused.  Objective: Vitals: Blood pressure (!) 148/104, pulse 68, temperature 97.8 F (36.6 C), temperature source Oral, resp. rate 20, SpO2 98 %.   Exam: Gen Exam:not in any distress HEENT:atraumatic, normocephalic Chest: B/L clear to auscultation anteriorly CVS:S1S2 regular Abdomen:soft non tender, non distended Extremities:no edema Neurology: Non focal Skin: no rash  Pertinent Labs/Radiology: Recent Labs  Lab 01/15/21 1319 01/15/21 1347 01/16/21 0345  WBC 5.4  --  5.0  HGB 10.6* 11.2* 10.7*  PLT 159  --  164  NA 139 142  --   K 3.7 3.7  --   CREATININE 1.18 1.20  --   AST 19  --   --   ALT 18  --   --   ALKPHOS 90  --   --   BILITOT 0.8  --   --     11/15>> MRI brain: No acute CVA 11/15>> CTA head/neck: No hemodynamically significant stenosis/LVO. 11/16>> A1c: 5.7 11/16>> LDL: 38 11/16>> Echo: Pending 11/16>> lower extremity Doppler: Pending  Assessment/Plan: TIA: Had transient dysarthria/RUE weakness on initial presentation that has since resolved.  Awaiting echo-PT/OT evaluation.  Likely due to PAF/cardioembolic-Long discussion with daughter at bedside-risk/benefits of resuming anticoagulation discussed-subsequently discussed with GI MD (Dr. Ardis Hughs over the phone) and Dr. Erlinda Hong (stroke MD)-consensus is to consider resuming anticoagulation-if  patient has recurrent severe GI bleeding in the future-we can contemplate stopping at that point.  We will reach out to family again-if they are willing to accept risk of GI bleeding-we will restart anticoagulation.  Right> left lower extremity swelling: Awaiting Dopplers-has IVC filter in place for a prior PE-unclear when it was exactly done.  History of prior diverticular bleeding x2 requiring hospitalization (10/22, 11/21): No active bleeding currently.  Prior bleeds were managed with supportive care-bleeding subsided spontaneously.  As noted above-Long discussion with daughters and numerous consulting physicians-most of the GI bleeding can be managed with supportive care-consensus is if family agrees to accept risk-restart anticoagulation.  Discussion in progress-see above.  Normocytic anemia: Sequelae of recent bleeding-no active bleeding noted currently-hemoglobin has been stable for the past several days.  HTN: Resume metoprolol.  HLD: Continue Lipitor  BPH: Continue Flomax  Dementia: Appears to be advanced-he recently moved into a memory care unit-expect delirium-maintain delirium precautions.  BMI Estimated body mass index is 25.94 kg/m as calculated from the following:   Height as of 12/26/20: 5\' 10"  (1.778 m).   Weight as of 12/26/20: 82 kg.     Procedures: None Consults: Neurology DVT Prophylaxis: SQ Lovenox Code Status:DNR Family Communication: Daughter at bedside (another daughter was on the speaker phone)  Time spent: 60 minutes-Greater than 50% of this time was spent in counseling, explanation of diagnosis, planning of further management, and coordination of care.   Disposition  Plan: Status is: Observation  The patient remains OBS appropriate and will d/c before 2 midnights.  Needs to complete stroke/TIA work-up-ongoing discussions regarding anticoagulation-needs PT/OT eval.   Diet: Diet Order             Diet Heart Room service appropriate? Yes; Fluid  consistency: Thin  Diet effective now                     Antimicrobial agents: Anti-infectives (From admission, onward)    None        MEDICATIONS: Scheduled Meds:  aspirin EC  81 mg Oral Daily   atorvastatin  40 mg Oral Daily   enoxaparin (LOVENOX) injection  40 mg Subcutaneous Q24H   multivitamin with minerals  1 tablet Oral Q breakfast   pantoprazole  40 mg Oral QAC breakfast   psyllium  1 packet Oral Daily   sodium chloride flush  3 mL Intravenous Once   tamsulosin  0.4 mg Oral q AM   vitamin B-12  100 mcg Oral Daily   Continuous Infusions: PRN Meds:.acetaminophen **OR** acetaminophen (TYLENOL) oral liquid 160 mg/5 mL **OR** acetaminophen, levalbuterol, metoprolol tartrate, senna-docusate   I have personally reviewed following labs and imaging studies  LABORATORY DATA: CBC: Recent Labs  Lab 01/15/21 1319 01/15/21 1347 01/16/21 0345  WBC 5.4  --  5.0  NEUTROABS 3.5  --   --   HGB 10.6* 11.2* 10.7*  HCT 33.3* 33.0* 33.2*  MCV 86.0  --  84.9  PLT 159  --  164    Basic Metabolic Panel: Recent Labs  Lab 01/15/21 1319 01/15/21 1347  NA 139 142  K 3.7 3.7  CL 106 106  CO2 25  --   GLUCOSE 103* 99  BUN 19 20  CREATININE 1.18 1.20  CALCIUM 8.5*  --     GFR: CrCl cannot be calculated (Unknown ideal weight.).  Liver Function Tests: Recent Labs  Lab 01/15/21 1319  AST 19  ALT 18  ALKPHOS 90  BILITOT 0.8  PROT 6.5  ALBUMIN 3.5   No results for input(s): LIPASE, AMYLASE in the last 168 hours. Recent Labs  Lab 01/15/21 2122  AMMONIA <10    Coagulation Profile: Recent Labs  Lab 01/15/21 1319  INR 1.1    Cardiac Enzymes: No results for input(s): CKTOTAL, CKMB, CKMBINDEX, TROPONINI in the last 168 hours.  BNP (last 3 results) No results for input(s): PROBNP in the last 8760 hours.  Lipid Profile: Recent Labs    01/16/21 0345  CHOL 111  HDL 64  LDLCALC 38  TRIG 43  CHOLHDL 1.7    Thyroid Function Tests: Recent Labs     01/16/21 0345  TSH 1.349    Anemia Panel: Recent Labs    01/15/21 2122  VITAMINB12 594    Urine analysis:    Component Value Date/Time   COLORURINE YELLOW 01/15/2021 2056   APPEARANCEUR CLEAR 01/15/2021 2056   LABSPEC 1.018 01/15/2021 2056   PHURINE 5.0 01/15/2021 2056   GLUCOSEU NEGATIVE 01/15/2021 2056   HGBUR NEGATIVE 01/15/2021 2056   BILIRUBINUR NEGATIVE 01/15/2021 2056   KETONESUR NEGATIVE 01/15/2021 2056   PROTEINUR NEGATIVE 01/15/2021 2056   NITRITE NEGATIVE 01/15/2021 2056   LEUKOCYTESUR NEGATIVE 01/15/2021 2056    Sepsis Labs: Lactic Acid, Venous No results found for: LATICACIDVEN  MICROBIOLOGY: Recent Results (from the past 240 hour(s))  Resp Panel by RT-PCR (Flu A&B, Covid) Nasopharyngeal Swab     Status: None   Collection Time: 01/15/21  5:40 PM   Specimen: Nasopharyngeal Swab; Nasopharyngeal(NP) swabs in vial transport medium  Result Value Ref Range Status   SARS Coronavirus 2 by RT PCR NEGATIVE NEGATIVE Final    Comment: (NOTE) SARS-CoV-2 target nucleic acids are NOT DETECTED.  The SARS-CoV-2 RNA is generally detectable in upper respiratory specimens during the acute phase of infection. The lowest concentration of SARS-CoV-2 viral copies this assay can detect is 138 copies/mL. A negative result does not preclude SARS-Cov-2 infection and should not be used as the sole basis for treatment or other patient management decisions. A negative result may occur with  improper specimen collection/handling, submission of specimen other than nasopharyngeal swab, presence of viral mutation(s) within the areas targeted by this assay, and inadequate number of viral copies(<138 copies/mL). A negative result must be combined with clinical observations, patient history, and epidemiological information. The expected result is Negative.  Fact Sheet for Patients:  EntrepreneurPulse.com.au  Fact Sheet for Healthcare Providers:   IncredibleEmployment.be  This test is no t yet approved or cleared by the [place] FDA and  has been authorized for detection and/or diagnosis of SARS-CoV-2 by FDA under an Emergency Use Authorization (EUA). This EUA will remain  in effect (meaning this test can be used) for the duration of the COVID-19 declaration under Section 564(b)(1) of the Act, 21 U.S.C.section 360bbb-3(b)(1), unless the authorization is terminated  or revoked sooner.       Influenza A by PCR NEGATIVE NEGATIVE Final   Influenza B by PCR NEGATIVE NEGATIVE Final    Comment: (NOTE) The Xpert Xpress SARS-CoV-2/FLU/RSV plus assay is intended as an aid in the diagnosis of influenza from Nasopharyngeal swab specimens and should not be used as a sole basis for treatment. Nasal washings and aspirates are unacceptable for Xpert Xpress SARS-CoV-2/FLU/RSV testing.  Fact Sheet for Patients: EntrepreneurPulse.com.au  Fact Sheet for Healthcare Providers: IncredibleEmployment.be  This test is not yet approved or cleared by the [place] FDA and has been authorized for detection and/or diagnosis of SARS-CoV-2 by FDA under an Emergency Use Authorization (EUA). This EUA will remain in effect (meaning this test can be used) for the duration of the COVID-19 declaration under Section 564(b)(1) of the Act, 21 U.S.C. section 360bbb-3(b)(1), unless the authorization is terminated or revoked.  Performed at [facility], [place] [address] [place], [place] [address]     RADIOLOGY STUDIES/RESULTS: DG Chest 1 View  Result Date: 01/15/2021 CLINICAL DATA:  Wheezing. EXAM: CHEST  1 VIEW COMPARISON:  Chest x-ray 10/17/2020. FINDINGS: There is stable mild elevation of the right hemidiaphragm. The heart is enlarged, unchanged. There is no focal lung consolidation, pleural effusion or pneumothorax. No acute fractures are seen. IMPRESSION: 1. Stable cardiomegaly. 2. No  evidence for pneumonia or edema. Electronically Signed   By: Ronney Asters M.D.   On: 01/15/2021 21:26   CT HEAD WO CONTRAST  Result Date: 01/15/2021 CLINICAL DATA:  Transient ischemic attack (TIA) EXAM: CT HEAD WITHOUT CONTRAST TECHNIQUE: Contiguous axial images were obtained from the base of the skull through the vertex without intravenous contrast. COMPARISON:  CT 10/17/2020. FINDINGS: Brain: No evidence of acute large vascular territory infarction, hemorrhage, extra-axial collection or mass lesion/mass effect. Similar diffuse atrophy. Similar ventriculomegaly. Crowding of sulci at the vertex. Somewhat acute callosum angle. Mild patchy white matter hypoattenuation, similar. Vascular: No hyperdense vessel identified. Calcific intracranial atherosclerosis. Skull: No acute fracture. Sinuses/Orbits: Visualized sinuses are clear. Other: No mastoid effusions. IMPRESSION: 1. No evidence of acute intracranial abnormality. 2. Similar  atrophy, probably ex vacuo in etiology given cerebral atrophy. Normal pressure hydrocephalus (NPH) is a differential consideration given acute callosal angle and sulcal crowding at the vertex. Recommend correlation with the presence or absence of signs/symptoms of NPH. Electronically Signed   By: Margaretha Sheffield M.D.   On: 01/15/2021 16:38   MR BRAIN WO CONTRAST  Result Date: 01/15/2021 CLINICAL DATA:  Acute neurologic deficit EXAM: MRI HEAD WITHOUT CONTRAST TECHNIQUE: Multiplanar, multiecho pulse sequences of the brain and surrounding structures were obtained without intravenous contrast. COMPARISON:  10/17/2020 FINDINGS: Brain: No acute infarct, mass effect or extra-axial collection. No acute or chronic hemorrhage. Hyperintense T2-weighted signal is widespread throughout the white matter. Diffuse, severe atrophy. The midline structures are normal. Vascular: Major flow voids are preserved. Skull and upper cervical spine: Normal calvarium and skull base. Visualized upper cervical  spine and soft tissues are normal. Sinuses/Orbits:No paranasal sinus fluid levels or advanced mucosal thickening. No mastoid or middle ear effusion. Normal orbits. IMPRESSION: 1. No acute intracranial abnormality. 2. Diffuse, severe atrophy and chronic ischemic microangiopathy. Electronically Signed   By: Ulyses Jarred M.D.   On: 01/15/2021 19:43   CT ANGIO HEAD NECK W WO CM W PERF (CODE STROKE)  Result Date: 01/15/2021 CLINICAL DATA:  Stroke/TIA, assess extracranial arteries; slurred speech, right arm weakness EXAM: CT ANGIOGRAPHY HEAD AND NECK CT PERFUSION BRAIN TECHNIQUE: Multidetector CT imaging of the head and neck was performed using the standard protocol during bolus administration of intravenous contrast. Multiplanar CT image reconstructions and MIPs were obtained to evaluate the vascular anatomy. Carotid stenosis measurements (when applicable) are obtained utilizing NASCET criteria, using the distal internal carotid diameter as the denominator. Multiphase CT imaging of the brain was performed following IV bolus contrast injection. Subsequent parametric perfusion maps were calculated using RAPID software. CONTRAST:  157mL OMNIPAQUE IOHEXOL 350 MG/ML SOLN COMPARISON:  None. FINDINGS: CTA NECK Aortic arch: Great vessel origins are patent with mild calcified plaque. Right carotid system: Patent. Retropharyngeal course. Mild calcified plaque along the proximal ICA. Left carotid system: Patent. Retropharyngeal course. Trace calcified plaque at the ICA origin. Vertebral arteries: Patent. Left vertebral artery slightly dominant. No stenosis. Skeleton: Cervical spine degenerative changes. Other neck: Thyroid nodules measuring up to 11 mm. No ultrasound follow-up is recommended by current guidelines. Upper chest: No apical lung masses. Review of the MIP images confirms the above findings CTA HEAD Anterior circulation: Intracranial internal carotid arteries are patent with mild calcified plaque. Anterior and  middle cerebral arteries are patent. Posterior circulation: Intracranial vertebral arteries are patent. Basilar artery is patent. Major cerebellar artery origins are patent. Posterior cerebral arteries are patent. Venous sinuses: Patent as allowed by contrast bolus timing. Review of the MIP images confirms the above findings CT Brain Perfusion Findings: CBF (<30%) Volume: 36mL Perfusion (Tmax>6.0s) volume: 4mL Mismatch Volume: 57mL Infarction Location: None. IMPRESSION: No large vessel occlusion, hemodynamically significant stenosis, or evidence of dissection. Perfusion imaging demonstrates no evidence of core infarction or penumbra. Electronically Signed   By: Macy Mis M.D.   On: 01/15/2021 17:39     LOS: 0 days   Oren Binet, MD  [facility]    To contact the attending provider between 7A-7P or the covering provider during after hours 7P-7A, please log into the web site [email or web address] and access using universal [place] password for that web site. If you do not have the password, please call the hospital operator.  01/16/2021, 11:17 AM

## 2021-01-16 NOTE — Evaluation (Signed)
Occupational Therapy Evaluation Patient Details Name: Ryan [name] MRN: [id number] DOB: 1934-06-27 Today's Date: 01/16/2021   History of Present Illness 86 y.o. M admitted on 10/26 due to bloody BM's. CTA showed mild diverticulitis of descending colon. PMH significant for Alzheimer's, Afib, HTN, and HLD.   Clinical Impression   At baseline pt lives at [facility] and has assistance from family or caregivers with ADL and mobility.  Most likely close to baseline level of function however pt is in a new environment and feel pt/family would benefit from working with OT in the ALF to [facility] function and reduce risk of falls. Will defer any further OT to facility. HR 129 and 2/4 DOE with after ambulating @ 10 ft - MD notified.     Recommendations for follow up therapy are one component of a multi-disciplinary discharge planning process, led by the attending physician.  Recommendations may be updated based on patient status, additional functional criteria and insurance authorization.   Follow Up Recommendations  Home health OT    Assistance Recommended at Discharge Frequent or constant Supervision/Assistance  Functional Status Assessment     Equipment Recommendations  None recommended by OT    Recommendations for Other Services       Precautions / Restrictions Precautions Precautions: Fall      Mobility Bed Mobility Overal bed mobility: Modified Independent                  Transfers Overall transfer level: Needs assistance   Transfers: Sit to/from Stand Sit to Stand: Min guard                  Balance Overall balance assessment: Needs assistance   Sitting balance-Leahy Scale: Good       Standing balance-Leahy Scale: Poor                             ADL either performed or assessed with clinical judgement   ADL Overall ADL's : Needs assistance/impaired                                       General ADL Comments: most  likely close to baseline     Vision Baseline Vision/History: 1 Wears glasses Vision Assessment?: No apparent visual deficits     Perception Perception Comments: decreased spatial awareness most likely related to dementia   Praxis      Pertinent Vitals/Pain Pain Assessment: Faces Faces Pain Scale: No hurt     Hand Dominance Right   Extremity/Trunk Assessment Upper Extremity Assessment Upper Extremity Assessment: Overall WFL for tasks assessed   Lower Extremity Assessment Lower Extremity Assessment: Defer to PT evaluation   Cervical / Trunk Assessment Cervical / Trunk Assessment: Kyphotic   Communication Communication Communication: No difficulties   Cognition Arousal/Alertness: Awake/alert Behavior During Therapy: WFL for tasks assessed/performed Overall Cognitive Status: History of cognitive impairments - at baseline                                 General Comments: Daughters states he is close to baseline; risk of confusion due to unfamiliar environment     General Comments       Exercises     Shoulder Instructions      Home Living Family/patient expects to be discharged to::  Assisted living Living Arrangements: Children;Other (Comment) (caregivers) Available Help at Discharge: Family;Available 24 hours/day;Personal care attendant                             Additional Comments: Has recently moved to ALF/[facility] memory care      Prior Functioning/Environment Prior Level of Function : Needs assist               ADLs Comments: needs physical and cognitve assist with ADL for thorough completion, however pt able to participate in tasks; incontinent        OT Problem List: Impaired balance (sitting and/or standing);Decreased safety awareness      OT Treatment/Interventions:      OT Goals(Current goals can be found in the care plan section) Acute Rehab OT Goals Patient Stated Goal: daughters goal is for pt to remain  mobile OT Goal Formulation: All assessment and education complete, DC therapy  OT Frequency:     Barriers to D/C:            Co-evaluation              AM-PAC OT "6 Clicks" Daily Activity     Outcome Measure Help from another person eating meals?: A Little Help from another person taking care of personal grooming?: A Little Help from another person toileting, which includes using toliet, bedpan, or urinal?: Total Help from another person bathing (including washing, rinsing, drying)?: A Lot Help from another person to put on and taking off regular upper body clothing?: A Lot Help from another person to put on and taking off regular lower body clothing?: A Lot 6 Click Score: 13   End of Session Equipment Utilized During Treatment: Gait belt;Rolling walker (2 wheels) Nurse Communication: Mobility status  Activity Tolerance: Patient tolerated treatment well Patient left: in chair;with call bell/phone within reach;with chair alarm set  OT Visit Diagnosis: Unsteadiness on feet (R26.81)                Time: 5462-7035 OT Time Calculation (min): 23 min Charges:  OT General Charges $OT Visit: 1 Visit OT Evaluation $OT Eval Low Complexity: 1 Low  Machael Raine, OT/L   Acute OT Clinical Specialist Acute Rehabilitation Services Pager [phone] Office [phone]   [facility] 01/16/2021, 4:13 PM

## 2021-01-16 NOTE — Progress Notes (Signed)
ANTICOAGULATION CONSULT NOTE - Initial Consult  Pharmacy Consult for apixaban Indication: atrial fibrillation  No Known Allergies  Patient Measurements:    Vital Signs: Temp: 97.8 F (36.6 C) (11/16 1116) Temp Source: Oral (11/16 1116) BP: 148/104 (11/16 1116) Pulse Rate: 68 (11/16 1116)  Labs: Recent Labs    01/15/21 1319 01/15/21 1347 01/16/21 0345  HGB 10.6* 11.2* 10.7*  HCT 33.3* 33.0* 33.2*  PLT 159  --  164  APTT 29  --   --   LABPROT 14.6  --   --   INR 1.1  --   --   CREATININE 1.18 1.20  --     CrCl cannot be calculated (Unknown ideal weight.).   Medical History: Past Medical History:  Diagnosis Date   Alzheimer disease (HCC)    Benign prostatic hyperplasia 01/30/2020   Chronic anticoagulation    On eliquis   Gait disorder    Hypercholesterolemia    Hypertension    Mixed hyperlipidemia 01/30/2020   Paroxysmal atrial fibrillation (HCC)     Medications:  Medications Prior to Admission  Medication Sig Dispense Refill Last Dose   atorvastatin (LIPITOR) 40 MG tablet Take 40 mg by mouth daily.   01/14/2021 at 1700   LORazepam (ATIVAN) 0.5 MG tablet Take 0.25 mg by mouth 2 (two) times daily as needed for anxiety.   unk   metoprolol tartrate (LOPRESSOR) 25 MG tablet Take 0.5 tablets (12.5 mg total) by mouth 2 (two) times daily. 90 tablet 3 01/15/2021 at 0800   Multiple Vitamin (MULTIVITAMIN) tablet Take 1 tablet by mouth daily with breakfast.   01/15/2021 at 0800   pantoprazole (PROTONIX) 40 MG tablet TAKE 1 TABLET(40 MG) BY MOUTH TWICE DAILY (Patient taking differently: Take 40 mg by mouth daily before breakfast.) 180 tablet 2 01/15/2021 at 0600   polyethylene glycol (MIRALAX / GLYCOLAX) 17 g packet Take 17 g by mouth daily as needed (for constipation- mix into 4 to 8 ounces of fluid).   unk   Probiotic Product (PROBIOTIC ADVANCED PO) Take 500 mg by mouth daily in the afternoon.   01/14/2021 at 1400   Psyllium 58.12 % PACK Mix one packet in water and drink  daily. 30 packet 0 01/15/2021 at 0800   tamsulosin (FLOMAX) 0.4 MG CAPS capsule Take 0.4 mg by mouth in the morning.   01/15/2021 at 0800   vitamin B-12 (CYANOCOBALAMIN) 100 MCG tablet Take 100 mcg by mouth daily.   01/15/2021 at 0800    Assessment:  This is an 86 year-old presenting on January 15, 2021 with a chief complaint of dysarthric speech and aphasia. This was diagnosed as a TIA with resolved symptoms. He has a history of PAF without anticoagulation therapy due to prior history of GI bleed. Risks of anticoagulation were discussed with family (daughter) and providers (GI, neurology, hospitalist).   Goal of Therapy:  Prevent thrombus formation due to A. Fib.  Monitor platelets by anticoagulation protocol: Yes   Plan:   Start Apixaban 5 mg po bid and monitor patient during this hospitalization.   Thank you for contacting Pharmacy and allowing [place] to participate in his care.   Kaleesi Guyton BS, PharmD, BCPS Clinical Pharmacist  01/16/2021,3:52 PM

## 2021-01-16 NOTE — Progress Notes (Signed)
PT Cancellation Note  Patient Details Name: Ryan [name] MRN: [id number] DOB: Apr 18, 1934   Cancelled Treatment:    Reason Eval/Treat Not Completed: Other (comment).  Pt is being presented his meal and will retry at another time.  Pt declined to sit up in chair for the meal.   Ivar Drape 01/16/2021, 12:54 PM  Samul Dada, PT PhD Acute Rehab Dept. Number: [facility] [id number] and [facility] [phone]

## 2021-01-16 NOTE — Progress Notes (Signed)
SLP Cancellation Note  Patient Details Name: Ryan [name] MRN: [id number] DOB: 09/19/1934   Cancelled treatment:       Reason Eval/Treat Not Completed: Given advanced dementia and no acute finding on MRI, acute SLP eval deferred. SLP screened, no needs identified, will sign off   Tirza Senteno, Riley Nearing 01/16/2021, 1:55 PM

## 2021-01-16 NOTE — ED Notes (Signed)
Pts bed changed, gown changed, and condom cath applied.

## 2021-01-16 NOTE — Progress Notes (Signed)
PT Cancellation Note  Patient Details Name: Ryan [name] MRN: [id number] DOB: September 28, 1934   Cancelled Treatment:    Reason Eval/Treat Not Completed: Other (comment).  Pt is unavailable with each attempt, finally refusing.  Will try again tomorrow as time and pt allow.   Ivar Drape 01/16/2021, 3:30 PM  Samul Dada, PT PhD Acute Rehab Dept. Number: [facility] [id number] and [facility] [phone]

## 2021-01-16 NOTE — ED Notes (Signed)
[facility] facility called to inquire about pt. Provided update

## 2021-01-16 NOTE — Progress Notes (Signed)
Bilateral lower extremity venous duplex completed. Refer to "CV Proc" under chart review to view preliminary results.  01/16/2021 1:57 PM Eula Fried., MHA, RVT, RDCS, RDMS

## 2021-01-16 NOTE — Progress Notes (Addendum)
STROKE TEAM PROGRESS NOTE   ATTENDING NOTE: I reviewed above note and agree with the assessment and plan. Pt was seen and examined.   86 year old male with history of dementia, urinary incontinence, lower GI bleeding in 12/2020, A. fib on Eliquis but was on hold since GI bleeding admitted for right-sided weakness and difficulty speaking.  Symptoms has resolved per daughter.  CT no acute abnormality.  CT head neck unremarkable, no LVO.  CT perfusion negative.  MRI no acute infarct.  LE venous Doppler no DVT.  EF 55%.  A1c 5.7, LDL 38.  Ammonia level less than 10.  Creatinine 1.2.  On exam, daughter at bedside, patient sitting in chair, awake alert, orientated to self and people with hint, not orientated to age time or place.  Intermittent word finding difficulty but daughter stated that was normal for him due to his dementia.  Able to name 2/4 and repeat simple sentences.  Visual fields full, no gaze palsy, mild right facial nasolabial fold flattening which is chronic per daughter.  Bilateral upper extremity 4/5, bilateral lower extremities 3/5, sensation symmetrical subjectively, finger-to-nose bilaterally intact grossly.  Etiology for patient symptoms concerning for TIA in the setting of A. fib not on AC.  Patient did have lower GI bleeding last month but seems stabilized and GI work-up no specific finding except uncomplicated diverticulitis.  Discussed with daughter and Ryan [name], feels benefit outweighs risk for restarting anticoagulation.  Daughter agrees, will resume Eliquis and monitor GI bleeding.  Continue Lipitor 40.  PT/OT recommend home health PT/OT.  For detailed assessment and plan, please refer to above as I have made changes wherever appropriate.   Neurology will sign off. Please call with questions. Pt will follow up with Ryan [name] at [facility] in about 4 weeks. Thanks for the consult.   Ryan Hawking, MD PhD Stroke Neurology 01/16/2021 6:21 PM    INTERVAL HISTORY Daughters are  at the bedside at time of this exam. Third daughter was called via phone for update.   Vitals:   01/16/21 0300 01/16/21 0400 01/16/21 0500 01/16/21 0600  BP: (!) 180/98 (!) 159/94 (!) 150/102 133/85  Pulse: 86 81 89 77  Resp: 18 18 11 14   Temp:  97.8 F (36.6 C)    TempSrc:  Oral    SpO2: 92% 95% 96% 93%   CBC:  Recent Labs  Lab 01/15/21 1319 01/15/21 1347 01/16/21 0345  WBC 5.4  --  5.0  NEUTROABS 3.5  --   --   HGB 10.6* 11.2* 10.7*  HCT 33.3* 33.0* 33.2*  MCV 86.0  --  84.9  PLT 159  --  123456   Basic Metabolic Panel:  Recent Labs  Lab 01/15/21 1319 01/15/21 1347  NA 139 142  K 3.7 3.7  CL 106 106  CO2 25  --   GLUCOSE 103* 99  BUN 19 20  CREATININE 1.18 1.20  CALCIUM 8.5*  --     Lipid Panel:  Recent Labs  Lab 01/16/21 0345  CHOL 111  TRIG 43  HDL 64  CHOLHDL 1.7  VLDL 9  LDLCALC 38    HgbA1c:  Recent Labs  Lab 01/16/21 0345  HGBA1C 5.7*   Urine Drug Screen: No results for input(s): LABOPIA, COCAINSCRNUR, LABBENZ, AMPHETMU, THCU, LABBARB in the last 168 hours.  Alcohol Level No results for input(s): ETH in the last 168 hours.  IMAGING past 24 hours DG Chest 1 View  Result Date: 01/15/2021 CLINICAL DATA:  Wheezing. EXAM: CHEST  1 VIEW COMPARISON:  Chest x-ray 10/17/2020. FINDINGS: There is stable mild elevation of the right hemidiaphragm. The heart is enlarged, unchanged. There is no focal lung consolidation, pleural effusion or pneumothorax. No acute fractures are seen. IMPRESSION: 1. Stable cardiomegaly. 2. No evidence for pneumonia or edema. Electronically Signed   By: Ronney Asters M.D.   On: 01/15/2021 21:26   CT HEAD WO CONTRAST  Result Date: 01/15/2021 CLINICAL DATA:  Transient ischemic attack (TIA) EXAM: CT HEAD WITHOUT CONTRAST TECHNIQUE: Contiguous axial images were obtained from the base of the skull through the vertex without intravenous contrast. COMPARISON:  CT 10/17/2020. FINDINGS: Brain: No evidence of acute large vascular territory  infarction, hemorrhage, extra-axial collection or mass lesion/mass effect. Similar diffuse atrophy. Similar ventriculomegaly. Crowding of sulci at the vertex. Somewhat acute callosum angle. Mild patchy white matter hypoattenuation, similar. Vascular: No hyperdense vessel identified. Calcific intracranial atherosclerosis. Skull: No acute fracture. Sinuses/Orbits: Visualized sinuses are clear. Other: No mastoid effusions. IMPRESSION: 1. No evidence of acute intracranial abnormality. 2. Similar atrophy, probably ex vacuo in etiology given cerebral atrophy. Normal pressure hydrocephalus (NPH) is a differential consideration given acute callosal angle and sulcal crowding at the vertex. Recommend correlation with the presence or absence of signs/symptoms of NPH. Electronically Signed   By: Margaretha Sheffield M.D.   On: 01/15/2021 16:38   MR BRAIN WO CONTRAST  Result Date: 01/15/2021 CLINICAL DATA:  Acute neurologic deficit EXAM: MRI HEAD WITHOUT CONTRAST TECHNIQUE: Multiplanar, multiecho pulse sequences of the brain and surrounding structures were obtained without intravenous contrast. COMPARISON:  10/17/2020 FINDINGS: Brain: No acute infarct, mass effect or extra-axial collection. No acute or chronic hemorrhage. Hyperintense T2-weighted signal is widespread throughout the white matter. Diffuse, severe atrophy. The midline structures are normal. Vascular: Major flow voids are preserved. Skull and upper cervical spine: Normal calvarium and skull base. Visualized upper cervical spine and soft tissues are normal. Sinuses/Orbits:No paranasal sinus fluid levels or advanced mucosal thickening. No mastoid or middle ear effusion. Normal orbits. IMPRESSION: 1. No acute intracranial abnormality. 2. Diffuse, severe atrophy and chronic ischemic microangiopathy. Electronically Signed   By: Ulyses Jarred M.D.   On: 01/15/2021 19:43   CT ANGIO HEAD NECK W WO CM W PERF (CODE STROKE)  Result Date: 01/15/2021 CLINICAL DATA:   Stroke/TIA, assess extracranial arteries; slurred speech, right arm weakness EXAM: CT ANGIOGRAPHY HEAD AND NECK CT PERFUSION BRAIN TECHNIQUE: Multidetector CT imaging of the head and neck was performed using the standard protocol during bolus administration of intravenous contrast. Multiplanar CT image reconstructions and MIPs were obtained to evaluate the vascular anatomy. Carotid stenosis measurements (when applicable) are obtained utilizing NASCET criteria, using the distal internal carotid diameter as the denominator. Multiphase CT imaging of the brain was performed following IV bolus contrast injection. Subsequent parametric perfusion maps were calculated using RAPID software. CONTRAST:  139mL OMNIPAQUE IOHEXOL 350 MG/ML SOLN COMPARISON:  None. FINDINGS: CTA NECK Aortic arch: Great vessel origins are patent with mild calcified plaque. Right carotid system: Patent. Retropharyngeal course. Mild calcified plaque along the proximal ICA. Left carotid system: Patent. Retropharyngeal course. Trace calcified plaque at the ICA origin. Vertebral arteries: Patent. Left vertebral artery slightly dominant. No stenosis. Skeleton: Cervical spine degenerative changes. Other neck: Thyroid nodules measuring up to 11 mm. No ultrasound follow-up is recommended by current guidelines. Upper chest: No apical lung masses. Review of the MIP images confirms the above findings CTA HEAD Anterior circulation: Intracranial internal carotid arteries are patent with mild calcified plaque. Anterior and middle cerebral  arteries are patent. Posterior circulation: Intracranial vertebral arteries are patent. Basilar artery is patent. Major cerebellar artery origins are patent. Posterior cerebral arteries are patent. Venous sinuses: Patent as allowed by contrast bolus timing. Review of the MIP images confirms the above findings CT Brain Perfusion Findings: CBF (<30%) Volume: 41mL Perfusion (Tmax>6.0s) volume: 47mL Mismatch Volume: 38mL Infarction  Location: None. IMPRESSION: No large vessel occlusion, hemodynamically significant stenosis, or evidence of dissection. Perfusion imaging demonstrates no evidence of core infarction or penumbra. Electronically Signed   By: Macy Mis M.D.   On: 01/15/2021 17:39    PHYSICAL EXAM  Mental Status: Patient is awake, alert, initially he has some degree of expressive aphasia, but this improved over the course of my exam.  He was dysarthric.  He gets his age but not the month. Cranial Nerves: II: Visual Fields are full. Pupils are equal, round, and reactive to light.   III,IV, VI: EOMI without ptosis or diploplia.  V: Facial sensation is symmetric to temperature VII: Facial movement is symmetric.  VIII: hearing is intact to voice X: Uvula elevates symmetrically XI: Shoulder shrug is symmetric. XII: tongue is midline without atrophy or fasciculations.  Motor: Tone is normal. Bulk is normal. 5/5 strength was present in all four extremities.  Sensory: Sensation is symmetric to light touch and temperature in the arms and legs. Deep Tendon Reflexes: 2+ and symmetric in the biceps and patellae.  Plantars: Toes are downgoing bilaterally.  Cerebellar: FNF and HKS are intact bilaterally      ASSESSMENT/PLAN Ryan [name] is a 86 y.o. male with history of  significant dementia, incontinent at baseline and needs assistance with most ADLs.  He is able to walk with a walker at times.  Today, he got a haircut around 10 AM and was presumably normal at the time.  Subsequently, he developed difficulty speaking though unclear exactly when.  His daughter found him sometime around noon and he was brought in by EMS, but at that time it was thought to be an unclear time of onset.  It was only once family contacted the hairdresser much later in the day that it was known to be within the timeframe for a code stroke and therefore a code stroke was activated later in the day.   Of note, during EMS transport  they did report right-sided weakness.    TIA - left brain TIA likely due to Afib not on [facility]    Code Stroke  CT head No acute abnormality.  Small vessel disease.   MRI BRAIN: 1. No acute intracranial abnormality. 2. Diffuse, severe atrophy and chronic ischemic microangiopathy. 2D Echo pending LE venous doppler neg for DVT LDL 38 HgbA1c 5.7 VTE prophylaxis - scd  No antithrombotic prior to admission, recommend 5mg  bid eliquis to resume given benefit outweigh risk at this time Therapy recommendations:  pending Disposition:  pending   LGIB Admitted on 12/25/2020 for profuse bloody bowel movement.  Eliquis discontinued at that time.  GI on board, consider diverticular bleed, CTA did not show any acute bleeding but did show uncomplicated mild diverticulitis of descending colon.  He was instructed to follow-up with PCP as outpatient to consider resume Eliquis if appropriate. Since discharge, patient has no GI bleeding, however has not discussed with his PCP regarding anticoagulation yet.  Hypertension Home meds:  metoprolol Stable Permissive hypertension (OK if < 220/120) but gradually normalize in 5-7 days Long-term BP goal normotensive  Hyperlipidemia Home meds:  lipitor 40mg ,  LDL 38,  goal < 70 Continue statin at discharge  Other Stroke Risk Factors  Advanced Age >/= 41   Other Active Problems Dementia with cognitive impairment  Hospital day # 0     To contact Stroke Continuity provider, please refer to [email or web address]. After hours, contact General Neurology

## 2021-01-16 NOTE — Care Management Obs Status (Signed)
MEDICARE OBSERVATION STATUS NOTIFICATION   Patient Details  Name: IZIAH CATES MRN: [id number] Date of Birth: 23-Nov-1934   Medicare Observation Status Notification Given:  Yes    Kermit Balo, RN 01/16/2021, 2:37 PM

## 2021-01-17 DIAGNOSIS — N400 Enlarged prostate without lower urinary tract symptoms: Secondary | ICD-10-CM | POA: Diagnosis not present

## 2021-01-17 DIAGNOSIS — G459 Transient cerebral ischemic attack, unspecified: Secondary | ICD-10-CM | POA: Diagnosis not present

## 2021-01-17 DIAGNOSIS — I480 Paroxysmal atrial fibrillation: Secondary | ICD-10-CM | POA: Diagnosis not present

## 2021-01-17 DIAGNOSIS — I10 Essential (primary) hypertension: Secondary | ICD-10-CM | POA: Diagnosis not present

## 2021-01-17 LAB — BASIC METABOLIC PANEL
Anion gap: 9 (ref 5–15)
BUN: 20 mg/dL (ref 8–23)
CO2: 24 mmol/L (ref 22–32)
Calcium: 8.6 mg/dL — ABNORMAL LOW (ref 8.9–10.3)
Chloride: 104 mmol/L (ref 98–111)
Creatinine, Ser: 1.1 mg/dL (ref 0.61–1.24)
GFR, Estimated: >60 mL/min (ref >60)
Glucose, Bld: 105 mg/dL — ABNORMAL HIGH (ref 70–99)
Potassium: 3.8 mmol/L (ref 3.5–5.1)
Sodium: 137 mmol/L (ref 135–145)

## 2021-01-17 LAB — CBC
HCT: 31.8 % — ABNORMAL LOW (ref 39.0–52.0)
Hemoglobin: 10.2 g/dL — ABNORMAL LOW (ref 13.0–17.0)
MCH: 27.3 pg (ref 26.0–34.0)
MCHC: 32.1 g/dL (ref 30.0–36.0)
MCV: 85.3 fL (ref 80.0–100.0)
Platelets: 144 10*3/uL — ABNORMAL LOW (ref 150–400)
RBC: 3.73 MIL/uL — ABNORMAL LOW (ref 4.22–5.81)
RDW: 14.2 % (ref 11.5–15.5)
WBC: 6.6 10*3/uL (ref 4.0–10.5)
nRBC: 0 % (ref 0.0–0.2)

## 2021-01-17 MED ORDER — POTASSIUM CHLORIDE CRYS ER 20 MEQ PO TBCR: 20.0000 meq | EXTENDED_RELEASE_TABLET | Freq: Every day | ORAL | 0 refills | Status: DC

## 2021-01-17 MED ORDER — GERHARDT'S BUTT CREAM: 1.0000 "application " | TOPICAL_CREAM | Freq: Every day | CUTANEOUS | 0 refills | Status: DC | PRN

## 2021-01-17 MED ORDER — METOPROLOL TARTRATE 25 MG PO TABS
25.0000 mg | ORAL_TABLET | Freq: Two times a day (BID) | ORAL | Status: DC
  Administered 2021-01-17: 09:00:00 25 mg via ORAL
  Filled 2021-01-17: qty 1

## 2021-01-17 MED ORDER — FUROSEMIDE 10 MG/ML IJ SOLN
40.0000 mg | Freq: Once | INTRAMUSCULAR | Status: AC
  Administered 2021-01-17: 09:00:00 40 mg via INTRAVENOUS
  Filled 2021-01-17: qty 4

## 2021-01-17 MED ORDER — APIXABAN 5 MG PO TABS: 5.0000 mg | ORAL_TABLET | Freq: Two times a day (BID) | ORAL | 1 refills | Status: DC

## 2021-01-17 MED ORDER — FUROSEMIDE 40 MG PO TABS: 20.0000 mg | ORAL_TABLET | Freq: Every day | ORAL | 0 refills | Status: DC

## 2021-01-17 MED ORDER — METOPROLOL TARTRATE 25 MG PO TABS: 25.0000 mg | ORAL_TABLET | Freq: Two times a day (BID) | ORAL | 0 refills | Status: DC

## 2021-01-17 NOTE — Evaluation (Signed)
Physical Therapy Evaluation Patient Details Name: Ryan [name] MRN: [id number] DOB: 21-Jan-1935 Today's Date: 01/17/2021  History of Present Illness  86 y.o. M admitted on 10/26 due to bloody BM's. CTA showed mild diverticulitis of descending colon. PMH significant for Alzheimer's, Afib, HTN, and HLD.  Clinical Impression  Patient presents with decreased mobility mainly due to generalized weakness and decreased activity tolerance.  He has been ambulatory at facility with assist, but now limited due to fatigue/elevated HR and mild dyspnea.  Per daughter report he has limited opportunity to mobilize at facility in memory care.  Feel he may benefit from follow up HHPT for progressing mobility/endurance.  PT will not follow acutely due to planned d/c today.        Recommendations for follow up therapy are one component of a multi-disciplinary discharge planning process, led by the attending physician.  Recommendations may be updated based on patient status, additional functional criteria and insurance authorization.  Follow Up Recommendations Home health PT    Assistance Recommended at Discharge    Functional Status Assessment    Equipment Recommendations  None recommended by PT    Recommendations for Other Services       Precautions / Restrictions Precautions Precautions: Fall      Mobility  Bed Mobility Overal bed mobility: Needs Assistance Bed Mobility: Supine to Sit     Supine to sit: Supervision     General bed mobility comments: increased time and HOB elevated; assist for condom cath, helping with stool incontinence    Transfers Overall transfer level: Needs assistance Equipment used: Rolling walker (2 wheels) Transfers: Sit to/from Stand Sit to Stand: Mod assist           General transfer comment: increased time, some lifting help    Ambulation/Gait Ambulation/Gait assistance: Min assist Gait Distance (Feet): 50 Feet Assistive device: Rolling walker (2  wheels) Gait Pattern/deviations: Step-to pattern;Step-through pattern;Wide base of support;Trunk flexed;Festinating;Shuffle       General Gait Details: increased time due to festination at times, noted HR 133 with ambulation, mild dyspnea, mild c/o fatigue  Stairs            Wheelchair Mobility    Modified Rankin (Stroke Patients Only) Modified Rankin (Stroke Patients Only) Pre-Morbid Rankin Score: Moderately severe disability Modified Rankin: Moderately severe disability     Balance Overall balance assessment: Needs assistance Sitting-balance support: Feet supported Sitting balance-Leahy Scale: Good     Standing balance support: Bilateral upper extremity supported;Single extremity supported Standing balance-Leahy Scale: Poor Standing balance comment: able to stand at sink holding on one hand and reach to test water temp; close S provided and assist for hygiene due to fecal incontinence                             Pertinent Vitals/Pain      Home Living Family/patient expects to be discharged to:: Assisted living Living Arrangements: Children;Other (Comment) (careegivers) Available Help at Discharge: Family;Available PRN/intermittently   Home Access: Level entry       Home Layout: One level Home Equipment: Agricultural consultant (2 wheels);BSC/3in1;Transport chair      Prior Function Prior Level of Function : Needs assist  Cognitive Assist : Mobility (cognitive);ADLs (cognitive)             ADLs Comments: needs physical and cognitve assist with ADL for thorough completion, however pt able to participate in tasks; incontinent     Hand Dominance  Dominant Hand: Right    Extremity/Trunk Assessment   Upper Extremity Assessment Upper Extremity Assessment: Defer to OT evaluation    Lower Extremity Assessment Lower Extremity Assessment: Overall WFL for tasks assessed    Cervical / Trunk Assessment Cervical / Trunk Assessment: Kyphotic   Communication   Communication: No difficulties  Cognition Arousal/Alertness: Awake/alert Behavior During Therapy: WFL for tasks assessed/performed Overall Cognitive Status: History of cognitive impairments - at baseline                                 General Comments: Daughters states he is close to baseline; risk of confusion due to unfamiliar environment        General Comments General comments (skin integrity, edema, etc.): daughter present, reports history of constipation despite frequent stools and has had PT in the past, but just "has his good days and bad days"    Exercises     Assessment/Plan    PT Assessment All further PT needs can be met in the next venue of care  PT Problem List         PT Treatment Interventions      PT Goals (Current goals can be found in the Care Plan section)  Acute Rehab PT Goals PT Goal Formulation: All assessment and education complete, DC therapy    Frequency     Barriers to discharge        Co-evaluation               AM-PAC PT "6 Clicks" Mobility  Outcome Measure Help needed turning from your back to your side while in a flat bed without using bedrails?: A Little Help needed moving from lying on your back to sitting on the side of a flat bed without using bedrails?: A Little Help needed moving to and from a bed to a chair (including a wheelchair)?: A Little Help needed standing up from a chair using your arms (e.g., wheelchair or bedside chair)?: A Lot Help needed to walk in hospital room?: A Little Help needed climbing 3-5 steps with a railing? : A Little 6 Click Score: 17    End of Session   Activity Tolerance: Patient limited by fatigue;Treatment limited secondary to medical complications (Comment) (tachycardia) Patient left: in bed;with call bell/phone within reach;with family/visitor present   PT Visit Diagnosis: Other abnormalities of gait and mobility (R26.89);Other symptoms and signs involving  the nervous system (R29.898)    Time: 0370-4888 PT Time Calculation (min) (ACUTE ONLY): 26 min   Charges:   PT Evaluation $PT Eval Low Complexity: 1 Low PT Treatments $Gait Training: 8-22 mins        Magda Kiel, PT Acute Rehabilitation Services Pager:[phone] [phone] 01/17/2021   Reginia Naas 01/17/2021, 10:41 AM

## 2021-01-17 NOTE — Discharge Summary (Addendum)
PATIENT DETAILS Name: Ryan [name] Age: 86 y.o. Sex: male Date of Birth: 09-Jun-1934 MRN: [id number]. Admitting Physician: Orma Flaming, MD [id number], Christiane Ha, MD  Admit Date: 01/15/2021 Discharge date: 01/17/2021  Recommendations for Outpatient Follow-up:  Follow up with PCP in 1-2 weeks Please obtain CMP/CBC in one week   Admitted From:  Memory care unit  Disposition: Memory care unit with home health services.   Home Health: Yes  Equipment/Devices: None  Discharge Condition: Stable  CODE STATUS:  DNR  Diet recommendation:  Diet Order             Diet - low sodium heart healthy           Diet Heart Room service appropriate? Yes; Fluid consistency: Thin  Diet effective now                    Brief Summary: Patient is a 86 y.o. male with history of advanced dementia living in a memory care unit, PAF not on anticoagulation due to recent diverticular bleeding-presented with transient dysarthria and RUE weakness.  Felt to have TIA-see below for further details  Pertinent Labs/Radiology: 11/15>> CXR: No PNA/edema 11/15>> MRI brain: No acute CVA 11/15>> CTA head/neck: No hemodynamically significant stenosis/LVO. 11/16>> A1c: 5.7 11/16>> LDL: 38 11/16>> Echo: EF [id number] obvious embolic source. 11/16>> lower extremity Doppler: Negative for DVT.  Brief Hospital Course: TIA: Had transient dysarthria/RUE weakness on initial presentation that has since resolved.  Likely due to PAF/cardioembolic-Long discussion with daughter's at bedside-risk/benefits of resuming anticoagulation discussed-subsequently discussed with GI MD (Dr. Ardis Hughs over the phone) and Dr. Erlinda Hong (stroke MD)-consensus is to consider resuming anticoagulation-if patient has recurrent severe GI bleeding in the future-we can contemplate stopping at that point.  After continued discussion-family accepted all risks (catastrophic GI bleed/ICH)-and patient was restarted on Eliquis.  Please continue to  monitor closely in the outpatient setting.   Right> left lower extremity swelling: Lower extremity Dopplers negative-has a prior IVC filter in place that was placed several years ago at another facility.  No evidence of cellulitis-no evidence of decompensated heart failure.  Will place on low-dose Lasix for a few days to see if this will help.  Attending MD at memory care to reevaluate/reassess.   History of prior diverticular bleeding x2 requiring hospitalization (10/22, 11/21): No active bleeding currently.  Prior bleeds were managed with supportive care-bleeding subsided spontaneously.  As noted above-Long discussion with daughters and numerous consulting physicians-including GI MD-Dr. Ronna Polio of the GI bleeding can be managed with supportive care-consensus is if family agrees to accept risk (family aware of catastrophic GI bleeding risk)-restart anticoagulation.  See above.   Normocytic anemia: Sequelae of recent bleeding-no active bleeding noted currently-hemoglobin has been stable for the past several days.   HTN: Continue metoprolol.   HLD: Continue Lipitor   BPH: Continue Flomax   Dementia: Appears to be advanced-he recently moved into a memory care unit-he is pleasantly confused-no major issues with delirium during this hospital stay.   BMI Estimated body mass index is 25.94 kg/m as calculated from the following:   Height as of 12/26/20: 5\' 10"  (1.778 m).   Weight as of 12/26/20: 82 kg.     RN pressure injury documentation: Pressure Injury 01/31/20 Leg Left 2 full thickness wounds to left upper thigh (Active)  01/31/20 0625  Location: Leg  Location Orientation: Left  Staging:   Wound Description (Comments): 2 full thickness wounds to left upper thigh  Present on Admission: Yes  Procedures None  Discharge Diagnoses:  Active Problems:   Mixed hyperlipidemia   Benign prostatic hyperplasia   AF (paroxysmal atrial fibrillation) (HCC)   Primary hypertension    Alzheimer disease (HCC)   TIA (transient ischemic attack)   Discharge Instructions:  Activity:  As tolerated with Full fall precautions use walker/cane & assistance as needed  Discharge Instructions     Ambulatory referral to Neurology   Complete by: As directed    Follow up with [name] at [facility] in 4 weeks.  Patient is [name] patient. Thanks.   Diet - low sodium heart healthy   Complete by: As directed    Increase activity slowly   Complete by: As directed       Allergies as of 01/17/2021   No Known Allergies      Medication List     TAKE these medications    apixaban 5 MG Tabs tablet Commonly known as: ELIQUIS Take 1 tablet (5 mg total) by mouth 2 (two) times daily.   atorvastatin 40 MG tablet Commonly known as: LIPITOR Take 40 mg by mouth daily.   furosemide 40 MG tablet Commonly known as: Lasix Take 0.5 tablets (20 mg total) by mouth daily.   Gerhardt's butt cream Crea Apply 1 application topically daily as needed for irritation.   LORazepam 0.5 MG tablet Commonly known as: ATIVAN Take 0.25 mg by mouth 2 (two) times daily as needed for anxiety.   Metamucil [employer] Fiber 58.12 % Pack Generic drug: Psyllium Mix one packet in water and drink daily.   metoprolol tartrate 25 MG tablet Commonly known as: LOPRESSOR Take 1 tablet (25 mg total) by mouth 2 (two) times daily. What changed: how much to take   multivitamin tablet Take 1 tablet by mouth daily with breakfast.   pantoprazole 40 MG tablet Commonly known as: PROTONIX TAKE 1 TABLET(40 MG) BY MOUTH TWICE DAILY What changed: See the new instructions.   polyethylene glycol 17 g packet Commonly known as: MIRALAX / GLYCOLAX Take 17 g by mouth daily as needed (for constipation- mix into 4 to 8 ounces of fluid).   potassium chloride SA 20 MEQ tablet Commonly known as: KLOR-CON Take 1 tablet (20 mEq total) by mouth daily.   PROBIOTIC ADVANCED PO Take 500 mg by mouth daily in the  afternoon.   tamsulosin 0.4 MG Caps capsule Commonly known as: FLOMAX Take 0.4 mg by mouth in the morning.   vitamin B-12 100 MCG tablet Commonly known as: CYANOCOBALAMIN Take 100 mcg by mouth daily.        Follow-up Information     Penumalli, Earlean Polka, MD. Schedule an appointment as soon as possible for a visit in 1 month(s).   Specialties: Neurology, Radiology Contact information: [address] [place] [address] [phone]         Lajean Manes, MD. Schedule an appointment as soon as possible for a visit in 1 week(s).   Specialty: Internal Medicine Contact information: [address] [place] [address] [phone]                No Known Allergies    Consultations:  neurology   Other Procedures/Studies: DG Chest 1 View  Result Date: 01/15/2021 CLINICAL DATA:  Wheezing. EXAM: CHEST  1 VIEW COMPARISON:  Chest x-ray 10/17/2020. FINDINGS: There is stable mild elevation of the right hemidiaphragm. The heart is enlarged, unchanged. There is no focal lung consolidation, pleural effusion or pneumothorax. No acute fractures  are seen. IMPRESSION: 1. Stable cardiomegaly. 2. No evidence for pneumonia or edema. Electronically Signed   By: Ronney Asters M.D.   On: 01/15/2021 21:26   CT HEAD WO CONTRAST  Result Date: 01/15/2021 CLINICAL DATA:  Transient ischemic attack (TIA) EXAM: CT HEAD WITHOUT CONTRAST TECHNIQUE: Contiguous axial images were obtained from the base of the skull through the vertex without intravenous contrast. COMPARISON:  CT 10/17/2020. FINDINGS: Brain: No evidence of acute large vascular territory infarction, hemorrhage, extra-axial collection or mass lesion/mass effect. Similar diffuse atrophy. Similar ventriculomegaly. Crowding of sulci at the vertex. Somewhat acute callosum angle. Mild patchy white matter hypoattenuation, similar. Vascular: No hyperdense vessel identified. Calcific intracranial atherosclerosis.  Skull: No acute fracture. Sinuses/Orbits: Visualized sinuses are clear. Other: No mastoid effusions. IMPRESSION: 1. No evidence of acute intracranial abnormality. 2. Similar atrophy, probably ex vacuo in etiology given cerebral atrophy. Normal pressure hydrocephalus (NPH) is a differential consideration given acute callosal angle and sulcal crowding at the vertex. Recommend correlation with the presence or absence of signs/symptoms of NPH. Electronically Signed   By: Margaretha Sheffield M.D.   On: 01/15/2021 16:38   MR BRAIN WO CONTRAST  Result Date: 01/15/2021 CLINICAL DATA:  Acute neurologic deficit EXAM: MRI HEAD WITHOUT CONTRAST TECHNIQUE: Multiplanar, multiecho pulse sequences of the brain and surrounding structures were obtained without intravenous contrast. COMPARISON:  10/17/2020 FINDINGS: Brain: No acute infarct, mass effect or extra-axial collection. No acute or chronic hemorrhage. Hyperintense T2-weighted signal is widespread throughout the white matter. Diffuse, severe atrophy. The midline structures are normal. Vascular: Major flow voids are preserved. Skull and upper cervical spine: Normal calvarium and skull base. Visualized upper cervical spine and soft tissues are normal. Sinuses/Orbits:No paranasal sinus fluid levels or advanced mucosal thickening. No mastoid or middle ear effusion. Normal orbits. IMPRESSION: 1. No acute intracranial abnormality. 2. Diffuse, severe atrophy and chronic ischemic microangiopathy. Electronically Signed   By: Ulyses Jarred M.D.   On: 01/15/2021 19:43   ECHOCARDIOGRAM COMPLETE  Result Date: 01/16/2021    ECHOCARDIOGRAM REPORT   Patient Name:   Ryan [name] Date of Exam: 01/16/2021 Medical Rec #:  [id number]      Height:       70.0 in Accession #:    [id number]     Weight:       180.8 lb Date of Birth:  1934/12/09      BSA:          2.000 m Patient Age:    89 years       BP:           133/85 mmHg Patient Gender: M              HR:           77 bpm. Exam Location:   Inpatient Procedure: 2D Echo, 3D Echo, Cardiac Doppler and Color Doppler Indications:    Stroke  History:        Patient has no prior history of Echocardiogram examinations.                 Abnormal ECG, Stroke and TIA, Arrythmias:Atrial Fibrillation,                 Signs/Symptoms:Alzheimer's and Altered Mental Status; Risk                 Factors:Hypertension and Dyslipidemia.  Sonographer:    Roseanna Rainbow RDCS Referring Phys: [id number] [facility]  Sonographer Comments: Technically difficult study due to poor echo  windows, suboptimal subcostal window and suboptimal apical window. IMPRESSIONS  1. Left ventricular ejection fraction, by estimation, is 55%. The left ventricle has normal function. The left ventricle demonstrates regional wall motion abnormalities with basal inferior hypokinesis. Left ventricular diastolic parameters are indeterminate.  2. Peak RV-RA gradient 21 mmHg. Right ventricular systolic function is mildly reduced. The right ventricular size is mildly enlarged.  3. Left atrial size was mildly dilated.  4. Right atrial size was mildly dilated.  5. The mitral valve is normal in structure. Trivial mitral valve regurgitation. No evidence of mitral stenosis. Moderate mitral annular calcification.  6. The aortic valve is tricuspid. Aortic valve regurgitation is trivial. Aortic valve sclerosis/calcification is present, without any evidence of aortic stenosis.  7. Aortic dilatation noted. There is mild dilatation of the aortic root and of the ascending aorta, measuring 39 mm.  8. The IVC was not visualized.  9. The patient was in atrial fibrillation. FINDINGS  Left Ventricle: Left ventricular ejection fraction, by estimation, is 55%. The left ventricle has normal function. The left ventricle demonstrates regional wall motion abnormalities. The left ventricular internal cavity size was normal in size. There is  no left ventricular hypertrophy. Left ventricular diastolic parameters are indeterminate. Right  Ventricle: Peak RV-RA gradient 21 mmHg. The right ventricular size is mildly enlarged. No increase in right ventricular wall thickness. Right ventricular systolic function is mildly reduced. Left Atrium: Left atrial size was mildly dilated. Right Atrium: Right atrial size was mildly dilated. Pericardium: Trivial pericardial effusion is present. Mitral Valve: The mitral valve is normal in structure. There is mild calcification of the mitral valve leaflet(s). Moderate mitral annular calcification. Trivial mitral valve regurgitation. No evidence of mitral valve stenosis. Tricuspid Valve: The tricuspid valve is normal in structure. Tricuspid valve regurgitation is trivial. Aortic Valve: The aortic valve is tricuspid. Aortic valve regurgitation is trivial. Aortic valve sclerosis/calcification is present, without any evidence of aortic stenosis. Pulmonic Valve: The pulmonic valve was normal in structure. Pulmonic valve regurgitation is not visualized. Aorta: Aortic dilatation noted. There is mild dilatation of the aortic root and of the ascending aorta, measuring 39 mm. Venous: The inferior vena cava was not well visualized. IAS/Shunts: No atrial level shunt detected by color flow Doppler.  LEFT VENTRICLE PLAX 2D LVIDd:         4.95 cm LVIDs:         3.00 cm LV PW:         1.10 cm LV IVS:        1.15 cm LVOT diam:     2.00 cm     3D Volume EF: LV SV:         50          3D EF:        56 % LV SV Index:   25          LV EDV:       142 ml LVOT Area:     3.14 cm    LV ESV:       62 ml                            LV SV:        80 ml  LV Volumes (MOD) LV vol d, MOD A2C: 63.5 ml LV vol d, MOD A4C: 67.1 ml LV vol s, MOD A2C: 32.3 ml LV vol s, MOD A4C: 29.9 ml LV SV MOD A2C:  31.2 ml LV SV MOD A4C:     67.1 ml LV SV MOD BP:      33.8 ml RIGHT VENTRICLE RV S prime:     14.20 cm/s TAPSE (M-mode): 1.9 cm LEFT ATRIUM             Index        RIGHT ATRIUM           Index LA diam:        4.30 cm 2.15 cm/m   RA Area:     19.00 cm LA  Vol (A2C):   47.2 ml 23.61 ml/m  RA Volume:   52.00 ml  26.01 ml/m LA Vol (A4C):   61.0 ml 30.51 ml/m LA Biplane Vol: 57.2 ml 28.61 ml/m  AORTIC VALVE LVOT Vmax:   107.50 cm/s LVOT Vmean:  62.150 cm/s LVOT VTI:    0.158 m  AORTA Ao Root diam: 3.75 cm Ao Asc diam:  3.90 cm MITRAL VALVE                TRICUSPID VALVE MV Area (PHT): 3.12 cm     TR Peak grad:   20.8 mmHg MV Decel Time: 243 msec     TR Vmax:        228.00 cm/s MV E velocity: 124.00 cm/s                             SHUNTS                             Systemic VTI:  0.16 m                             Systemic Diam: 2.00 cm Dalton McleanMD Electronically signed by Franki Monte Signature Date/Time: 01/16/2021/4:34:49 PM    Final    VAS [place] LOWER EXTREMITY VENOUS (DVT)  Result Date: 01/16/2021  Lower Venous DVT Study Patient Name:  Ryan [name]  Date of Exam:   01/16/2021 Medical Rec #: [id number]       Accession #:    [id number] Date of Birth: March 19, 1934       Patient Gender: M Patient Age:   86 years Exam Location:  [facility] Procedure:      VAS [place] LOWER EXTREMITY VENOUS (DVT) Referring Phys: Orma Flaming --------------------------------------------------------------------------------  Indications: Edema.  Comparison Study: 10/18/2020- negative bilateral lower extremity venous duplex Performing Technologist: Maudry Mayhew MHA, RDMS, RVT, RDCS  Examination Guidelines: A complete evaluation includes B-mode imaging, spectral Doppler, color Doppler, and power Doppler as needed of all accessible portions of each vessel. Bilateral testing is considered an integral part of a complete examination. Limited examinations for reoccurring indications may be performed as noted. The reflux portion of the exam is performed with the patient in reverse Trendelenburg.  +---------+---------------+---------+-----------+----------+--------------+ RIGHT    CompressibilityPhasicitySpontaneityPropertiesThrombus Aging  +---------+---------------+---------+-----------+----------+--------------+ CFV      Full           Yes      Yes                                 +---------+---------------+---------+-----------+----------+--------------+ SFJ      Full                                                        +---------+---------------+---------+-----------+----------+--------------+  FV Prox  Full                                                        +---------+---------------+---------+-----------+----------+--------------+ FV Mid   Full                                                        +---------+---------------+---------+-----------+----------+--------------+ FV DistalFull                                                        +---------+---------------+---------+-----------+----------+--------------+ PFV      Full                                                        +---------+---------------+---------+-----------+----------+--------------+ POP      Full           Yes      Yes                                 +---------+---------------+---------+-----------+----------+--------------+ PTV      Full                                                        +---------+---------------+---------+-----------+----------+--------------+ PERO     Full                                                        +---------+---------------+---------+-----------+----------+--------------+   +---------+---------------+---------+-----------+----------+--------------+ LEFT     CompressibilityPhasicitySpontaneityPropertiesThrombus Aging +---------+---------------+---------+-----------+----------+--------------+ CFV      Full           Yes      Yes                                 +---------+---------------+---------+-----------+----------+--------------+ SFJ      Full                                                         +---------+---------------+---------+-----------+----------+--------------+ FV Prox  Full                                                        +---------+---------------+---------+-----------+----------+--------------+  FV Mid   Full                                                        +---------+---------------+---------+-----------+----------+--------------+ FV DistalFull                                                        +---------+---------------+---------+-----------+----------+--------------+ PFV      Full                                                        +---------+---------------+---------+-----------+----------+--------------+ POP      Full           Yes      Yes                                 +---------+---------------+---------+-----------+----------+--------------+ PTV      Full                                                        +---------+---------------+---------+-----------+----------+--------------+ PERO     Full                                                        +---------+---------------+---------+-----------+----------+--------------+     Summary: RIGHT: - There is no evidence of deep vein thrombosis in the lower extremity.  - No cystic structure found in the popliteal fossa.  LEFT: - There is no evidence of deep vein thrombosis in the lower extremity.  - No cystic structure found in the popliteal fossa.  *See table(s) above for measurements and observations. Electronically signed by Harold Barban MD on 01/16/2021 at 8:54:19 PM.    Final    CT ANGIO HEAD NECK W WO CM W PERF (CODE STROKE)  Result Date: 01/15/2021 CLINICAL DATA:  Stroke/TIA, assess extracranial arteries; slurred speech, right arm weakness EXAM: CT ANGIOGRAPHY HEAD AND NECK CT PERFUSION BRAIN TECHNIQUE: Multidetector CT imaging of the head and neck was performed using the standard protocol during bolus administration of intravenous contrast. Multiplanar CT  image reconstructions and MIPs were obtained to evaluate the vascular anatomy. Carotid stenosis measurements (when applicable) are obtained utilizing NASCET criteria, using the distal internal carotid diameter as the denominator. Multiphase CT imaging of the brain was performed following IV bolus contrast injection. Subsequent parametric perfusion maps were calculated using RAPID software. CONTRAST:  150mL OMNIPAQUE IOHEXOL 350 MG/ML SOLN COMPARISON:  None. FINDINGS: CTA NECK Aortic arch: Great vessel origins are patent with mild calcified plaque. Right carotid system: Patent. Retropharyngeal course. Mild calcified plaque along the proximal ICA. Left carotid system: Patent. Retropharyngeal course.  Trace calcified plaque at the ICA origin. Vertebral arteries: Patent. Left vertebral artery slightly dominant. No stenosis. Skeleton: Cervical spine degenerative changes. Other neck: Thyroid nodules measuring up to 11 mm. No ultrasound follow-up is recommended by current guidelines. Upper chest: No apical lung masses. Review of the MIP images confirms the above findings CTA HEAD Anterior circulation: Intracranial internal carotid arteries are patent with mild calcified plaque. Anterior and middle cerebral arteries are patent. Posterior circulation: Intracranial vertebral arteries are patent. Basilar artery is patent. Major cerebellar artery origins are patent. Posterior cerebral arteries are patent. Venous sinuses: Patent as allowed by contrast bolus timing. Review of the MIP images confirms the above findings CT Brain Perfusion Findings: CBF (<30%) Volume: 24mL Perfusion (Tmax>6.0s) volume: 11mL Mismatch Volume: 70mL Infarction Location: None. IMPRESSION: No large vessel occlusion, hemodynamically significant stenosis, or evidence of dissection. Perfusion imaging demonstrates no evidence of core infarction or penumbra. Electronically Signed   By: Macy Mis M.D.   On: 01/15/2021 17:39   CT ANGIO GI BLEED  Result  Date: 12/26/2020 CLINICAL DATA:  AFib on Eliquis.  Dark red blood per rectum. EXAM: CTA ABDOMEN AND PELVIS WITHOUT AND WITH CONTRAST TECHNIQUE: Multidetector CT imaging of the abdomen and pelvis was performed using the standard protocol during bolus administration of intravenous contrast. Multiplanar reconstructed images and MIPs were obtained and reviewed to evaluate the vascular anatomy. CONTRAST:  168mL OMNIPAQUE IOHEXOL 350 MG/ML SOLN COMPARISON:  CT abdomen and pelvis 01/31/2020 FINDINGS: VASCULAR Aorta: Normal caliber aorta without aneurysm, dissection, vasculitis or significant stenosis. There is calcified atherosclerotic disease in the aorta. 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 Branches appear patent. No significant stenosis. SMA: Patent without evidence of aneurysm, dissection, vasculitis or significant stenosis. 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 Renal arteries are otherwise within normal limits. IMA: Patent without evidence of aneurysm, dissection, vasculitis or significant stenosis. Inflow: Patent without evidence of aneurysm, dissection, vasculitis or significant stenosis. Proximal Outflow: Bilateral common femoral and visualized portions of the superficial and profunda femoral arteries are patent without evidence of aneurysm, dissection, vasculitis or significant stenosis. Veins: Infrarenal IVC filter in place which appears unchanged in size. On arterial phase imaging there is some vascularity seen posterior to the IVC at the level of the filter with some questionable arterial feeder vessel. There is some contrast opacification in the IVC at this level. Findings are unchanged from the prior examination. No thrombus identified. Review of the MIP images confirms the above findings. NON-VASCULAR Lower chest: There is atelectasis in the lung bases. The heart is enlarged Hepatobiliary:  There is a hypodensity in the right lobe of the liver which is too small to characterize, likely a cyst or hemangioma. Gallbladder surgically absent. Pancreas: Unremarkable. No pancreatic ductal dilatation or surrounding inflammatory changes. Spleen: Normal in size without focal abnormality. Adrenals/Urinary Tract: There is a 2 cm right renal cyst. There is a 4.1 cm left renal cyst. There are additional hypodensities in both kidneys which are too small to characterize. There is no hydronephrosis or perinephric fluid. Bladder is distended. Numerous bladder diverticula are again seen. Bladder wall trabeculation is again noted. Adrenal glands are within normal limits. Stomach/Bowel: There are no areas of active extravasation of contrast on arterial or portal venous phase imaging within the bowel. There is no evidence for bowel obstruction or free air. There is diffuse colonic diverticulosis. There is mild  inflammatory stranding surrounding diverticula in the distal descending colon compatible with acute diverticulitis. There is no evidence for perforation or abscess. Lymphatic: Aortic atherosclerosis. No enlarged abdominal or pelvic lymph nodes. Reproductive: Prostate gland is mildly enlarged, unchanged. Other: There is no free fluid. There is a small fat containing umbilical hernia. Musculoskeletal: There are healed right-sided rib fractures. Multilevel degenerative changes affect the spine. There are degenerative changes of the right hip. Left hip arthroplasty is present. IMPRESSION: VASCULAR 1. No evidence for active gastrointestinal bleeding. 2. IVC filter in place. There is vascularity posterior to the IVC at the level of the filter with questionable arterial feeder vessels and contrast opacification of the IVC on arterial phase imaging. Findings are suspicious for AV malformation or fistula at this level. 3. 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 4. 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 NON-VASCULAR 1. Acute uncomplicated descending colon diverticulitis. 2. Stable trabeculated bladder wall with multiple bladder diverticula suggesting chronic bladder outlet obstruction. Electronically Signed   By: Ronney Asters M.D.   On: 12/26/2020 21:41     TODAY-DAY OF DISCHARGE:  Subjective:   Ryan [name] today has no headache,no chest abdominal pain,no new weakness tingling or numbness, feels much better wants to go home today.   Objective:   Blood pressure (!) 154/109, pulse 78, temperature 98.6 F (37 C), temperature source Oral, resp. rate (!) 22, SpO2 98 %.  Intake/Output Summary (Last 24 hours) at 01/17/2021 0954 Last data filed at 01/17/2021 0800 Gross per 24 hour  Intake --  Output 901 ml  Net -901 ml   There were no vitals filed for this visit.  Exam: Awake Alert, Oriented *3, No new F.N deficits, Normal affect [place].AT,PERRAL Supple Neck,No JVD, No cervical lymphadenopathy appriciated.  Symmetrical Chest wall movement, Good air movement bilaterally, CTAB RRR,No Gallops,Rubs or new Murmurs, No Parasternal Heave +ve B.Sounds, Abd Soft, Non tender, No organomegaly appriciated, No rebound -guarding or rigidity. No Cyanosis, Clubbing or edema, No new Rash or bruise   PERTINENT RADIOLOGIC STUDIES: DG Chest 1 View  Result Date: 01/15/2021 CLINICAL DATA:  Wheezing. EXAM: CHEST  1 VIEW COMPARISON:  Chest x-ray 10/17/2020. FINDINGS: There is stable mild elevation of the right hemidiaphragm. The heart is enlarged, unchanged. There is no focal lung consolidation, pleural effusion or pneumothorax. No acute fractures are seen. IMPRESSION: 1. Stable cardiomegaly. 2. No evidence for pneumonia or edema. Electronically Signed   By: Ronney Asters M.D.   On: 01/15/2021 21:26   CT HEAD WO CONTRAST  Result Date: 01/15/2021 CLINICAL DATA:  Transient ischemic attack (TIA) EXAM: CT HEAD WITHOUT CONTRAST TECHNIQUE: Contiguous axial images were obtained from the base of the skull  through the vertex without intravenous contrast. COMPARISON:  CT 10/17/2020. FINDINGS: Brain: No evidence of acute large vascular territory infarction, hemorrhage, extra-axial collection or mass lesion/mass effect. Similar diffuse atrophy. Similar ventriculomegaly. Crowding of sulci at the vertex. Somewhat acute callosum angle. Mild patchy white matter hypoattenuation, similar. Vascular: No hyperdense vessel identified. Calcific intracranial atherosclerosis. Skull: No acute fracture. Sinuses/Orbits: Visualized sinuses are clear. Other: No mastoid effusions. IMPRESSION: 1. No evidence of acute intracranial abnormality. 2. Similar atrophy, probably ex vacuo in etiology given cerebral atrophy. Normal pressure hydrocephalus (NPH) is a differential consideration given acute callosal angle and sulcal crowding at the vertex. Recommend correlation with the presence or absence of signs/symptoms of NPH. Electronically Signed   By: Margaretha Sheffield M.D.   On: 01/15/2021 16:38   MR BRAIN WO  CONTRAST  Result Date: 01/15/2021 CLINICAL DATA:  Acute neurologic deficit EXAM: MRI HEAD WITHOUT CONTRAST TECHNIQUE: Multiplanar, multiecho pulse sequences of the brain and surrounding structures were obtained without intravenous contrast. COMPARISON:  10/17/2020 FINDINGS: Brain: No acute infarct, mass effect or extra-axial collection. No acute or chronic hemorrhage. Hyperintense T2-weighted signal is widespread throughout the white matter. Diffuse, severe atrophy. The midline structures are normal. Vascular: Major flow voids are preserved. Skull and upper cervical spine: Normal calvarium and skull base. Visualized upper cervical spine and soft tissues are normal. Sinuses/Orbits:No paranasal sinus fluid levels or advanced mucosal thickening. No mastoid or middle ear effusion. Normal orbits. IMPRESSION: 1. No acute intracranial abnormality. 2. Diffuse, severe atrophy and chronic ischemic microangiopathy. Electronically Signed   By:  Deatra Robinson M.D.   On: 01/15/2021 19:43   ECHOCARDIOGRAM COMPLETE  Result Date: 01/16/2021    ECHOCARDIOGRAM REPORT   Patient Name:   Ryan [name] Date of Exam: 01/16/2021 Medical Rec #:  [id number]      Height:       70.0 in Accession #:    [id number]     Weight:       180.8 lb Date of Birth:  Jul 03, 1934      BSA:          2.000 m Patient Age:    86 years       BP:           133/85 mmHg Patient Gender: M              HR:           77 bpm. Exam Location:  Inpatient Procedure: 2D Echo, 3D Echo, Cardiac Doppler and Color Doppler Indications:    Stroke  History:        Patient has no prior history of Echocardiogram examinations.                 Abnormal ECG, Stroke and TIA, Arrythmias:Atrial Fibrillation,                 Signs/Symptoms:Alzheimer's and Altered Mental Status; Risk                 Factors:Hypertension and Dyslipidemia.  Sonographer:    Sheralyn Boatman RDCS Referring Phys: [id number] [facility]  Sonographer Comments: Technically difficult study due to poor echo windows, suboptimal subcostal window and suboptimal apical window. IMPRESSIONS  1. Left ventricular ejection fraction, by estimation, is 55%. The left ventricle has normal function. The left ventricle demonstrates regional wall motion abnormalities with basal inferior hypokinesis. Left ventricular diastolic parameters are indeterminate.  2. Peak RV-RA gradient 21 mmHg. Right ventricular systolic function is mildly reduced. The right ventricular size is mildly enlarged.  3. Left atrial size was mildly dilated.  4. Right atrial size was mildly dilated.  5. The mitral valve is normal in structure. Trivial mitral valve regurgitation. No evidence of mitral stenosis. Moderate mitral annular calcification.  6. The aortic valve is tricuspid. Aortic valve regurgitation is trivial. Aortic valve sclerosis/calcification is present, without any evidence of aortic stenosis.  7. Aortic dilatation noted. There is mild dilatation of the aortic root and of the  ascending aorta, measuring 39 mm.  8. The IVC was not visualized.  9. The patient was in atrial fibrillation. FINDINGS  Left Ventricle: Left ventricular ejection fraction, by estimation, is 55%. The left ventricle has normal function. The left ventricle demonstrates regional wall motion abnormalities. The left ventricular internal cavity size was normal in size.  There is  no left ventricular hypertrophy. Left ventricular diastolic parameters are indeterminate. Right Ventricle: Peak RV-RA gradient 21 mmHg. The right ventricular size is mildly enlarged. No increase in right ventricular wall thickness. Right ventricular systolic function is mildly reduced. Left Atrium: Left atrial size was mildly dilated. Right Atrium: Right atrial size was mildly dilated. Pericardium: Trivial pericardial effusion is present. Mitral Valve: The mitral valve is normal in structure. There is mild calcification of the mitral valve leaflet(s). Moderate mitral annular calcification. Trivial mitral valve regurgitation. No evidence of mitral valve stenosis. Tricuspid Valve: The tricuspid valve is normal in structure. Tricuspid valve regurgitation is trivial. Aortic Valve: The aortic valve is tricuspid. Aortic valve regurgitation is trivial. Aortic valve sclerosis/calcification is present, without any evidence of aortic stenosis. Pulmonic Valve: The pulmonic valve was normal in structure. Pulmonic valve regurgitation is not visualized. Aorta: Aortic dilatation noted. There is mild dilatation of the aortic root and of the ascending aorta, measuring 39 mm. Venous: The inferior vena cava was not well visualized. IAS/Shunts: No atrial level shunt detected by color flow Doppler.  LEFT VENTRICLE PLAX 2D LVIDd:         4.95 cm LVIDs:         3.00 cm LV PW:         1.10 cm LV IVS:        1.15 cm LVOT diam:     2.00 cm     3D Volume EF: LV SV:         50          3D EF:        56 % LV SV Index:   25          LV EDV:       142 ml LVOT Area:     3.14 cm     LV ESV:       62 ml                            LV SV:        80 ml  LV Volumes (MOD) LV vol d, MOD A2C: 63.5 ml LV vol d, MOD A4C: 67.1 ml LV vol s, MOD A2C: 32.3 ml LV vol s, MOD A4C: 29.9 ml LV SV MOD A2C:     31.2 ml LV SV MOD A4C:     67.1 ml LV SV MOD BP:      33.8 ml RIGHT VENTRICLE RV S prime:     14.20 cm/s TAPSE (M-mode): 1.9 cm LEFT ATRIUM             Index        RIGHT ATRIUM           Index LA diam:        4.30 cm 2.15 cm/m   RA Area:     19.00 cm LA Vol (A2C):   47.2 ml 23.61 ml/m  RA Volume:   52.00 ml  26.01 ml/m LA Vol (A4C):   61.0 ml 30.51 ml/m LA Biplane Vol: 57.2 ml 28.61 ml/m  AORTIC VALVE LVOT Vmax:   107.50 cm/s LVOT Vmean:  62.150 cm/s LVOT VTI:    0.158 m  AORTA Ao Root diam: 3.75 cm Ao Asc diam:  3.90 cm MITRAL VALVE                TRICUSPID VALVE MV Area (PHT): 3.12 cm  TR Peak grad:   20.8 mmHg MV Decel Time: 243 msec     TR Vmax:        228.00 cm/s MV E velocity: 124.00 cm/s                             SHUNTS                             Systemic VTI:  0.16 m                             Systemic Diam: 2.00 cm Dalton McleanMD Electronically signed by Wilfred Lacy Signature Date/Time: 01/16/2021/4:34:49 PM    Final    VAS [place] LOWER EXTREMITY VENOUS (DVT)  Result Date: 01/16/2021  Lower Venous DVT Study Patient Name:  Ryan [name]  Date of Exam:   01/16/2021 Medical Rec #: [id number]       Accession #:    [id number] Date of Birth: 19-Dec-1934       Patient Gender: M Patient Age:   89 years Exam Location:  [facility] Procedure:      VAS [place] LOWER EXTREMITY VENOUS (DVT) Referring Phys: Orland Mustard --------------------------------------------------------------------------------  Indications: Edema.  Comparison Study: 10/18/2020- negative bilateral lower extremity venous duplex Performing Technologist: Gertie Fey MHA, RDMS, RVT, RDCS  Examination Guidelines: A complete evaluation includes B-mode imaging, spectral Doppler, color Doppler, and power Doppler as  needed of all accessible portions of each vessel. Bilateral testing is considered an integral part of a complete examination. Limited examinations for reoccurring indications may be performed as noted. The reflux portion of the exam is performed with the patient in reverse Trendelenburg.  +---------+---------------+---------+-----------+----------+--------------+ RIGHT    CompressibilityPhasicitySpontaneityPropertiesThrombus Aging +---------+---------------+---------+-----------+----------+--------------+ CFV      Full           Yes      Yes                                 +---------+---------------+---------+-----------+----------+--------------+ SFJ      Full                                                        +---------+---------------+---------+-----------+----------+--------------+ FV Prox  Full                                                        +---------+---------------+---------+-----------+----------+--------------+ FV Mid   Full                                                        +---------+---------------+---------+-----------+----------+--------------+ FV DistalFull                                                        +---------+---------------+---------+-----------+----------+--------------+  PFV      Full                                                        +---------+---------------+---------+-----------+----------+--------------+ POP      Full           Yes      Yes                                 +---------+---------------+---------+-----------+----------+--------------+ PTV      Full                                                        +---------+---------------+---------+-----------+----------+--------------+ PERO     Full                                                        +---------+---------------+---------+-----------+----------+--------------+    +---------+---------------+---------+-----------+----------+--------------+ LEFT     CompressibilityPhasicitySpontaneityPropertiesThrombus Aging +---------+---------------+---------+-----------+----------+--------------+ CFV      Full           Yes      Yes                                 +---------+---------------+---------+-----------+----------+--------------+ SFJ      Full                                                        +---------+---------------+---------+-----------+----------+--------------+ FV Prox  Full                                                        +---------+---------------+---------+-----------+----------+--------------+ FV Mid   Full                                                        +---------+---------------+---------+-----------+----------+--------------+ FV DistalFull                                                        +---------+---------------+---------+-----------+----------+--------------+ PFV      Full                                                        +---------+---------------+---------+-----------+----------+--------------+  KoreaDoreene BurkeOlga MillersNadara Mustard [phone] Center - IrvingGames developerRiverdale ParkTy HiltsIllinoisIndiana [phone] KoMalvin Johnse [phone] Monteoralerd<[phone] or penumbra. Electronically Signed   By: Praneil  Patel M.D.   On: 11/15/2022[place] 17:3ShaMarland Kitchenren CounterLAB 4540ESMalvin Johns:BuelDespina Hiddenh ManEvon SlaMikaelBuelah MaEvon SlaKentuckyckiskShaMarland Kitchenren Counterden4540oud  01/16/21 0345 01/17/21 0254  WBC 5.0 6.6  HGB 10.7* 10.2*  HCT 33.2* 31.8*  PLT 164 144*   CMET CMP     Component Value Date/Time   NA 137 01/17/2021 0254   K 3.8 01/17/2021 0254   CL 104 01/17/2021 0254   CO2 24 01/17/2021 0254   GLUCOSE 105 (H) 01/17/2021 0254   BUN 20 01/17/2021 0254   CREATININE 1.10 01/17/2021 0254   CALCIUM 8.6 (L) 01/17/2021 0254   PROT 6.5 01/15/2021 1319   ALBUMIN 3.5 01/15/2021 1319   AST 19 01/15/2021 1319   ALT 18 01/15/2021 1319   ALKPHOS 90 01/15/2021 1319   BILITOT 0.8 01/15/2021 1319   GFRNONAA >60 01/17/2021 0254    GFR CrCl cannot be calculated (Unknown ideal weight.). No results for input(s): LIPASE, AMYLASE in the last 72 hours. No results for input(s): CKTOTAL, CKMB, CKMBINDEX, TROPONINI in the last 72 hours. Invalid input(s): POCBNP No results for input(s): DDIMER in the last 72 hours. Recent Labs    01/16/21 0345  HGBA1C 5.7*   Recent Labs    01/16/21 0345  CHOL 111  HDL 64  LDLCALC 38  TRIG 43  CHOLHDL 1.7   Recent Labs    01/16/21 0345  TSH 1.349   Recent Labs    01/15/21 2122  VITAMINB12 594   Coags: Recent Labs    01/15/21 1319  INR 1.1   Microbiology: Recent Results (from the past 240 hour(s))  Resp Panel by RT-PCR (Flu A&B, Covid) Nasopharyngeal Swab     Status: None   Collection Time: 01/15/21  5:40 PM   Specimen: Nasopharyngeal Swab; Nasopharyngeal(NP) swabs in vial transport medium  Result Value Ref Range Status   SARS Coronavirus 2 by  RT PCR NEGATIVE NEGATIVE Final    Comment: (NOTE) SARS-CoV-2 target nucleic acids are NOT DETECTED.  The SARS-CoV-2 RNA is generally detectable in upper respiratory specimens during the acute phase of infection. The lowest concentration of SARS-CoV-2 viral copies this assay can detect is 138 copies/mL. A negative result does not preclude SARS-Cov-2 infection and should not be used as the sole basis for treatment or other patient management decisions. A negative result may occur with  improper specimen collection/handling, submission of specimen other than nasopharyngeal swab, presence of viral mutation(s) within the areas targeted by this assay, and inadequate number of viral copies(<138 copies/mL). A negative result must be combined with clinical observations, patient history, and epidemiological information. The expected result is Negative.  Fact Sheet for Patients:  EntrepreneurPulse.com.au  Fact Sheet for Healthcare Providers:  IncredibleEmployment.be  This test is no t yet approved or cleared by the [place] FDA and  has been authorized for detection and/or diagnosis of SARS-CoV-2 by FDA under an Emergency Use Authorization (EUA). This EUA will remain  in effect (meaning this test can be used) for the duration of the COVID-19 declaration under Section 564(b)(1) of the Act, 21 U.S.C.section 360bbb-3(b)(1), unless the authorization is terminated  or revoked sooner.       Influenza A by PCR NEGATIVE NEGATIVE Final   Influenza B by PCR NEGATIVE NEGATIVE Final    Comment: (NOTE) The Xpert Xpress SARS-CoV-2/FLU/RSV plus assay is intended as an aid in the diagnosis of influenza from Nasopharyngeal swab specimens and should not be used as a sole basis for treatment. Nasal washings and aspirates are unacceptable for Xpert Xpress SARS-CoV-2/FLU/RSV testing.  Fact Sheet for Patients: EntrepreneurPulse.com.au  Fact Sheet  for Healthcare Providers: IncredibleEmployment.be  This test is  not yet approved or cleared by the [place] and has been authorized for detection and/or diagnosis of SARS-CoV-2 by FDA under an Emergency Use Authorization (EUA). This EUA will remain in effect (meaning this test can be used) for the duration of the COVID-19 declaration under Section 564(b)(1) of the Act, 21 U.S.C. section 360bbb-3(b)(1), unless the authorization is terminated or revoked.  Performed at [facility], [place] [address], [place], [place] [address]     FURTHER DISCHARGE INSTRUCTIONS:  Get Medicines reviewed and adjusted: Please take all your medications with you for your next visit with your Primary MD  Laboratory/radiological data: Please request your Primary MD to go over all hospital tests and procedure/radiological results at the follow up, please ask your Primary MD to get all Hospital records sent to his/her office.  In some cases, they will be blood work, cultures and biopsy results pending at the time of your discharge. Please request that your primary care M.D. goes through all the records of your hospital data and follows up on these results.  Also Note the following: If you experience worsening of your admission symptoms, develop shortness of breath, life threatening emergency, suicidal or homicidal thoughts you must seek medical attention immediately by calling 911 or calling your MD immediately  if symptoms less severe.  You must read complete instructions/literature along with all the possible adverse reactions/side effects for all the Medicines you take and that have been prescribed to you. Take any new Medicines after you have completely understood and accpet all the possible adverse reactions/side effects.   Do not drive when taking Pain medications or sleeping medications (Benzodaizepines)  Do not take more than prescribed Pain, Sleep and Anxiety Medications.  It is not advisable to combine anxiety,sleep and pain medications without talking with your primary care practitioner  Special Instructions: If you have smoked or chewed Tobacco  in the last 2 yrs please stop smoking, stop any regular Alcohol  and or any Recreational drug use.  Wear Seat belts while driving.  Please note: You were cared for by a hospitalist during your hospital stay. Once you are discharged, your primary care physician will handle any further medical issues. Please note that NO REFILLS for any discharge medications will be authorized once you are discharged, as it is imperative that you return to your primary care physician (or establish a relationship with a primary care physician if you do not have one) for your post hospital discharge needs so that they can reassess your need for medications and monitor your lab values.  Total Time spent coordinating discharge including counseling, education and face to face time equals  45 minutes.  SignedOren Binet 01/17/2021 9:54 AM

## 2021-01-17 NOTE — NC FL2 (Signed)
[place] MEDICAID FL2 LEVEL OF CARE SCREENING TOOL     IDENTIFICATION  Patient Name: Ryan [name] Birthdate: [birth year] Sex: male Admission Date (Current Location): 01/15/2021  [facility] and [place] Number:  Producer, television/film/video and Address:  [facility], [address], [place], [place] [address]      Provider Number: [id number]  Attending Physician Name and Address:  Maretta Bees, MD  Relative Name and Phone Number:       Current Level of Care: Hospital Recommended Level of Care: Memory Care Prior Approval Number:    Date Approved/Denied:   PASRR Number:    Discharge Plan: Other (Comment) (memory care)    Current Diagnoses: Patient Active Problem List   Diagnosis Date Noted   TIA (transient ischemic attack) 01/15/2021   GI bleeding 12/26/2020   Prolonged QT interval 12/26/2020   PAF (paroxysmal atrial fibrillation) (HCC) 02/15/2020   Alzheimer disease (HCC) 02/15/2020   GI bleed 01/31/2020   Lower GI bleed    Primary hypertension    Acute GI bleeding 01/30/2020   Mixed hyperlipidemia 01/30/2020   Benign prostatic hyperplasia 01/30/2020   Anemia due to acute blood loss 01/30/2020   AF (paroxysmal atrial fibrillation) (HCC) 01/30/2020    Orientation RESPIRATION BLADDER Height & Weight     Self  Normal Incontinent Weight:   Height:     BEHAVIORAL SYMPTOMS/MOOD NEUROLOGICAL BOWEL NUTRITION STATUS      Incontinent Diet (mechanical soft)  AMBULATORY STATUS COMMUNICATION OF NEEDS Skin   Limited Assist Verbally Normal                       Personal Care Assistance Level of Assistance  Bathing, Feeding, Dressing Bathing Assistance: Limited assistance Feeding assistance: Limited assistance Dressing Assistance: Limited assistance     Functional Limitations Info  Speech     Speech Info: Impaired (dysarthria)    SPECIAL CARE FACTORS FREQUENCY  PT (By licensed PT), OT (By licensed OT)     PT Frequency: 3x/wk with home  health OT Frequency: 3x/wk with home health            Contractures Contractures Info: Not present    Additional Factors Info  Code Status, Allergies Code Status Info: DNR Allergies Info: NKA           Current Medications (01/17/2021):  This is the current hospital active medication list Current Facility-Administered Medications  Medication Dose Route Frequency Provider Last Rate Last Admin   acetaminophen (TYLENOL) tablet 650 mg  650 mg Oral Q4H PRN Orland Mustard, MD       Or   acetaminophen (TYLENOL) 160 MG/5ML solution 650 mg  650 mg Per Tube Q4H PRN Orland Mustard, MD       Or   acetaminophen (TYLENOL) suppository 650 mg  650 mg Rectal Q4H PRN Orland Mustard, MD       apixaban Everlene Balls) tablet 5 mg  5 mg Oral BID Reome, Earle J, RPH   5 mg at 01/17/21 0855   atorvastatin (LIPITOR) tablet 40 mg  40 mg Oral Daily Orland Mustard, MD   40 mg at 01/17/21 0855   levalbuterol (XOPENEX) nebulizer solution 0.63 mg  0.63 mg Nebulization Q6H PRN Orland Mustard, MD   0.63 mg at 01/16/21 0344   metoprolol tartrate (LOPRESSOR) injection 5 mg  5 mg Intravenous Q8H PRN Orland Mustard, MD       metoprolol tartrate (LOPRESSOR) tablet 25 mg  25 mg Oral  BID Jonetta Osgood, MD   25 mg at 01/17/21 [id number]   multivitamin with minerals tablet 1 tablet  1 tablet Oral Q breakfast Orma Flaming, MD   1 tablet at 01/17/21 0855   pantoprazole (PROTONIX) EC tablet 40 mg  40 mg Oral QAC breakfast Orma Flaming, MD   40 mg at 01/17/21 0855   polyethylene glycol (MIRALAX / GLYCOLAX) packet 17 g  17 g Oral Daily PRN Jonetta Osgood, MD       psyllium (HYDROCIL/METAMUCIL) 1 packet  1 packet Oral Daily Heloise Purpura, RPH   1 packet at 01/17/21 0901   senna-docusate (Senokot-S) tablet 1 tablet  1 tablet Oral QHS PRN Orma Flaming, MD       sodium chloride flush (NS) 0.9 % injection 3 mL  3 mL Intravenous Once Roemhildt, Lorin T, PA-C       tamsulosin (FLOMAX) capsule 0.4 mg  0.4 mg Oral q AM Orma Flaming, MD   0.4 mg at 01/17/21 [id number]   vitamin B-12 (CYANOCOBALAMIN) tablet 100 mcg  100 mcg Oral Daily Heloise Purpura, RPH   100 mcg at 01/17/21 [id number]     Discharge Medications: TAKE these medications     apixaban 5 MG Tabs tablet Commonly known as: ELIQUIS Take 1 tablet (5 mg total) by mouth 2 (two) times daily.    atorvastatin 40 MG tablet Commonly known as: LIPITOR Take 40 mg by mouth daily.    furosemide 40 MG tablet Commonly known as: Lasix Take 0.5 tablets (20 mg total) by mouth daily.    Gerhardt's butt cream Crea Apply 1 application topically daily as needed for irritation.    LORazepam 0.5 MG tablet Commonly known as: ATIVAN Take 0.25 mg by mouth 2 (two) times daily as needed for anxiety.    Metamucil [facility] Fiber 58.12 % Pack Generic drug: Psyllium Mix one packet in water and drink daily.    metoprolol tartrate 25 MG tablet Commonly known as: LOPRESSOR Take 1 tablet (25 mg total) by mouth 2 (two) times daily. What changed: how much to take    multivitamin tablet Take 1 tablet by mouth daily with breakfast.    pantoprazole 40 MG tablet Commonly known as: PROTONIX TAKE 1 TABLET(40 MG) BY MOUTH TWICE DAILY What changed: See the new instructions.    polyethylene glycol 17 g packet Commonly known as: MIRALAX / GLYCOLAX Take 17 g by mouth daily as needed (for constipation- mix into 4 to 8 ounces of fluid).    potassium chloride SA 20 MEQ tablet Commonly known as: KLOR-CON Take 1 tablet (20 mEq total) by mouth daily.    PROBIOTIC ADVANCED PO Take 500 mg by mouth daily in the afternoon.    tamsulosin 0.4 MG Caps capsule Commonly known as: FLOMAX Take 0.4 mg by mouth in the morning.    vitamin B-12 100 MCG tablet Commonly known as: CYANOCOBALAMIN Take 100 mcg by mouth daily.    Relevant Imaging Results:  Relevant Lab Results:   Additional Information SS#: [id number]  Geralynn Ochs, LCSW

## 2021-01-17 NOTE — TOC Transition Note (Signed)
Transition of Care [facility]) - CM/SW Discharge Note   Patient Details  Name: Ryan [name] MRN: [id number] Date of Birth: 1935-02-15  [facility] CM/SW Contact:  Baldemar Lenis, LCSW Phone Number: 01/17/2021, 11:33 AM   Clinical Narrative:   Nurse to call report to [phone].    Final next level of care: Memory Care Barriers to Discharge: Barriers Resolved   Patient Goals and CMS Choice   CMS Medicare.gov Compare Post Acute Care list provided to:: Legal Guardian    Discharge Placement              Patient chooses bed at: [facility] Patient to be transferred to facility by: Family Name of family member notified: [name] Patient and family notified of of transfer: 01/17/21  Discharge Plan and Services   Discharge Planning Services: CM Consult                      HH Arranged: PT, OT HH Agency: Other - See comment International aid/development worker) Date HH Agency Contacted: 01/17/21   Representative spoke with at [facility] Agency: Rene Kocher  Social Determinants of Health (SDOH) Interventions     Readmission Risk Interventions Readmission Risk Prevention Plan 02/06/2020  Post Dischage Appt Complete  Medication Screening Complete  Transportation Screening Complete  Some recent data might be hidden

## 2021-01-17 NOTE — Discharge Instructions (Addendum)

## 2021-01-25 ENCOUNTER — Other Ambulatory Visit: Payer: Self-pay

## 2021-01-25 ENCOUNTER — Non-Acute Institutional Stay: Payer: Medicare Other | Admitting: Hospice

## 2021-01-25 DIAGNOSIS — F01C0 Vascular dementia, severe, without behavioral disturbance, psychotic disturbance, mood disturbance, and anxiety: Secondary | ICD-10-CM

## 2021-01-25 DIAGNOSIS — I480 Paroxysmal atrial fibrillation: Secondary | ICD-10-CM

## 2021-01-25 DIAGNOSIS — Z515 Encounter for palliative care: Secondary | ICD-10-CM

## 2021-01-25 NOTE — Progress Notes (Signed)
[facility] Consult Note Telephone: [phone]  Fax: [phone]  PATIENT NAME: Ryan [name] DOB: 1934-06-12 MRN: [id number]  PRIMARY CARE PROVIDER:   Lajean Manes, MD Ryan [name], [place] [address],  [place] [address]  REFERRING PROVIDER: Lajean Manes, MD Ryan [name], [place] [address] [place],  [place] [address]  RESPONSIBLE PARTY:  Ryan [name] is Ryan [name] Spouse:Ryan [name]: [phone] Contact Information     Name Relation Home Work Ryan [name], Ryan [name] Legal Guardian   [phone]   Ryan [name] Daughter   [phone]   Ryan [name] Daughter   [phone]   Ryan [name] Daughter   [phone]       Visit is to build trust and highlight Palliative Medicine as specialized medical care for people living with serious illness, aimed at facilitating better quality of life through symptoms relief, assisting with advance care planning and complex medical decision making. NP called Ryan [name] and Ryan [name] and left them voicemails with call back number. This is a follow up visit.  RECOMMENDATIONS/PLAN:   Advance Care Planning/Code Status:Patient is a Do Not Resuscitate  Goals of Care: Goals of care include to maximize quality of life and symptom management.  Visit consisted of counseling and education dealing with the complex and emotionally intense issues of symptom management and palliative care in the setting of serious and potentially life-threatening illness. Palliative care team will continue to support patient, patient's family, and medical team.  Symptom management/Plan:  Dementia: Memory loss/confusion is progressive.  FAST 6D, incontinent of bowel and bladder. Continue reminiscence, word search/puzzles, cueing for recollection.  Promote calm approach and engaging environment.  Safety precautions in place.  Afib: Continue Eliquis. Gait disturbance: Falls precautions.  Rolling  walker and transport chair on hand.  Balance of rest and performance activity Follow up Palliative Care visit: Palliative care will continue to follow for complex medical decision making, advance care planning, and clarification of goals. Return 6 weeks or prn.Encouraged to call provider sooner with any concerns.   CHIEF COMPLAINT: Palliative follow up   HISTORY OF PRESENT ILLNESS:  Ryan [name] is a 86 y.o. year old male  with multiple medical conditions including Alzheimer dementia which is advanced,  gait disturbance, HTN, Afib, Hyperlipidemia.   Hospitalized 11/15-11/17/22 for TIA with transient dysarthria and RUE weakness. He does not remember going to the hospital; answered simple questions directed at him with no difficulty speaking. Patient denies pain/discomfort, no cough or shortness of breath. FLACC 0. History obtained from review of EMR, discussion with primary team, family and/or patient. Records reviewed and summarized above. All 10 point systems reviewed and are negative except as documented in history of present illness above  Review and summarization of Epic records shows history from other than patient.   Palliative Care was asked to follow this patient o help address complex decision making in the context of advance care planning and goals of care clarification. I reviewed, as needed, available labs, patient records, imaging, studies and related documents from the EMR.    PHYSICAL EXAM  Constitutional: NAD General: Well groomed, cooperative EYES: anicteric sclera, lids intact, no discharge ENMT: Moist mucous membrane CV: S1 S2, RRR Pulmonary: LCTA, no increased work of breathing, no cough, Abdomen: active BS + 4 quadrants, soft and non tender, no ascites GU: no suprapubic tenderness MSK: weakness,  limited ROM, ambulatory with rolling walker Skin: warm and dry, no rashes or wounds on visible skin Neuro:  weakness, otherwise non focal, forgetful, memory loss Psych:  non-anxious affect Hem/lymph/immuno: no widespread bruising  PERTINENT MEDICATIONS:  Outpatient Encounter Medications as of 01/25/2021  Medication Sig   apixaban (ELIQUIS) 5 MG TABS tablet Take 1 tablet (5 mg total) by mouth 2 (two) times daily.   atorvastatin (LIPITOR) 40 MG tablet Take 40 mg by mouth daily.   furosemide (LASIX) 40 MG tablet Take 0.5 tablets (20 mg total) by mouth daily.   LORazepam (ATIVAN) 0.5 MG tablet Take 0.25 mg by mouth 2 (two) times daily as needed for anxiety.   metoprolol tartrate (LOPRESSOR) 25 MG tablet Take 1 tablet (25 mg total) by mouth 2 (two) times daily.   Multiple Vitamin (MULTIVITAMIN) tablet Take 1 tablet by mouth daily with breakfast.   Nystatin (GERHARDT'S BUTT CREAM) CREA Apply 1 application topically daily as needed for irritation.   pantoprazole (PROTONIX) 40 MG tablet TAKE 1 TABLET(40 MG) BY MOUTH TWICE DAILY (Patient taking differently: Take 40 mg by mouth daily before breakfast.)   polyethylene glycol (MIRALAX / GLYCOLAX) 17 g packet Take 17 g by mouth daily as needed (for constipation- mix into 4 to 8 ounces of fluid).   potassium chloride (KLOR-CON) 20 MEQ tablet Take 1 tablet (20 mEq total) by mouth daily.   Probiotic Product (PROBIOTIC ADVANCED PO) Take 500 mg by mouth daily in the afternoon.   Psyllium 58.12 % PACK Mix one packet in water and drink daily.   tamsulosin (FLOMAX) 0.4 MG CAPS capsule Take 0.4 mg by mouth in the morning.   vitamin B-12 (CYANOCOBALAMIN) 100 MCG tablet Take 100 mcg by mouth daily.   No facility-administered encounter medications on file as of 01/25/2021.    HOSPICE ELIGIBILITY/DIAGNOSIS: TBD  PAST MEDICAL HISTORY:  Past Medical History:  Diagnosis Date   Alzheimer disease (HCC)    Benign prostatic hyperplasia 01/30/2020   Chronic anticoagulation    On eliquis   Gait disorder    Hypercholesterolemia    Hypertension    Mixed hyperlipidemia 01/30/2020   Paroxysmal atrial fibrillation (HCC)        ALLERGIES: No Known Allergies    I spent 45 minutes providing this consultation; this includes time spent with patient/family, chart review and documentation. More than 50% of the time in this consultation was spent on counseling and coordinating communication   Thank you for the opportunity to participate in the care of [facility] Please call our office at [phone] if we can be of additional assistance.  Note: Portions of this note were generated with Scientist, clinical (histocompatibility and immunogenetics). Dictation errors may occur despite best attempts at proofreading.  Rosaura Carpenter, NP

## 2021-02-07 ENCOUNTER — Encounter: Payer: Self-pay | Admitting: Nurse Practitioner

## 2021-02-07 ENCOUNTER — Ambulatory Visit (INDEPENDENT_AMBULATORY_CARE_PROVIDER_SITE_OTHER): Payer: Medicare Other | Admitting: Nurse Practitioner

## 2021-02-07 VITALS — BP 142/82 | HR 90 | Ht 70.0 in | Wt 177.0 lb

## 2021-02-07 DIAGNOSIS — K5731 Diverticulosis of large intestine without perforation or abscess with bleeding: Secondary | ICD-10-CM

## 2021-02-07 DIAGNOSIS — K2100 Gastro-esophageal reflux disease with esophagitis, without bleeding: Secondary | ICD-10-CM | POA: Diagnosis not present

## 2021-02-07 MED ORDER — PANTOPRAZOLE SODIUM 40 MG PO TBEC: 40.0000 mg | DELAYED_RELEASE_TABLET | Freq: Every day | ORAL | 2 refills | Status: DC

## 2021-02-07 NOTE — Patient Instructions (Signed)
Decrease Pantoprazole 40 mg to once daily.   If you are age 84 or older, your body mass index should be between 23-30. Your Body mass index is 25.4 kg/m. If this is out of the aforementioned range listed, please consider follow up with your Primary Care Provider.  If you are age 16 or younger, your body mass index should be between 19-25. Your Body mass index is 25.4 kg/m. If this is out of the aformentioned range listed, please consider follow up with your Primary Care Provider.   ________________________________________________________  The [facility] providers would like to encourage you to use [facility] to communicate with providers for non-urgent requests or questions.  Due to long hold times on the telephone, sending your provider a message by [facility] may be a faster and more efficient way to get a response.  Please allow 48 business hours for a response.  Please remember that this is for non-urgent requests.  _______________________________________________________

## 2021-02-07 NOTE — Progress Notes (Signed)
ASSESSMENT AND PLAN    # 86 year old male hospitalized in October  2022 with melena on Eliquis.  Suspected to have a right-sided diverticular bleed based on presentation.  No active bleeding on CTA.  Daughter wanted to hold off on a colonoscopy.  Eliquis was held, bleeding ceased.  No bleeding since.  Eliquis resumed  following readmission in November for TIA vrs small ischemic infarct  # History of GERD. Appears Pantoprazole was increased to BID during recent admission.  Patient did not undergo endoscopic evaluation during that admission but diverticular bleed was felt most likely.   --At this point I think it is safe to decrease pantoprazole to once daily   HISTORY OF PRESENT ILLNESS    Chief Complaint : Hospital follow-up   Ryan [name] is a 86 y.o. male with a past medical history of Alzheimer's dementia, PAF.  Additional medical history as listed in PMH .   Patient was last seen by [place] in the hospital December 2021 for evaluation of hematochezia.  Family opted against colonoscopy but was open to flexible sigmoidoscopy.  The prep was poor but left-sided diverticulosis was found.  There was no active bleeding.  He was presumed to had a diverticulum. On same day he also underwent EGD for dysphagia and heartburn.  There was mild esophagitis, nonobstructing Schatzki's ring,  a 4 cm hiatal hernia    We saw patient again for hospital consultation late October 2022 for recurrent GI bleed with melena on Eliquis.  A right-sided diverticular bleed was suspected. He continued to bleed, CTA was done and negative for active bleeding but did suggest diverticulitis.  We recommended a short course of antibiotics.  Patient continued to bleed.  We discussed colonoscopy with his daughter who was instead in favor of continued observation.   Bleeding eventually stopped.  His hemoglobin had drifted to 10.2, about 3 g down from baseline.  He did not require blood transfusion.  Eliquis was stopped at  discharge  Patient was readmitted mid November with strokelike symptoms off Eliquis.  Evaluated by Neurology and felt to have had a small ischemic infarct versus TIA.  Neurology felt the benefits of Eliquis outweighed the risk, Eliquis was resumed.   INTERVAL HISTORY :   Patient is here with his daughter for hospital follow-up.  No further bleeding.  Patient is unable to give a history due to Alzheimer's dementia.  Daughter asked about patient's bowel movements.  Prior to hospital admission daughter and her sisters were taking care of the patient and he was having a lot of bowel movements at home.  In the hospital this was addressed and felt to possibly be overflow diarrhea related to constipation.  He was apparently put on fiber and MiraLAX.  Daughter does not understand how patient could have been constipated at home.  He was not having diarrhea just what seemed like an excessive amount of mushy stools.  She is currently unaware of what his bowel habits are now.  She does not know what he is taking at the facility.  Current medication list suggests he is taking MiraLAX as needed.   PREVIOUS ENDOSCOPIC EVALUATIONS / PERTINENT STUDIES:   EGD December 2021 for dysphagia, heartburn No gross lesions in esophagus proximally. LA Grade A esophagitis distally. - Widely patent and non-obstructing Schatzki ring. - Z-line irregular, 36 cm from the incisors. - Esophagus biopsied. Esophagus dilated with evidence of mucosal wrents below UES and at Schatzki ring - 4 cm hiatal hernia. - Erythematous  mucosa in the stomach. Biopsied. - No gross lesions in the duodenal bulb, in the first portion of the duodenum and in the second portion of the duodenum. Biopsied.  December 2021 flexible sigmoidoscopy for evaluation of diarrhea and hematochezia --Preparation of the colon was poor even after extensive lavage. - Hemorrhoids found on digital rectal exam. - Diverticulosis in the recto-sigmoid colon and in the  sigmoid colon. - Normal mucosa in the entire examined colon was visualized. Biopsied. - Non-bleeding non-thrombosed external and internal hemorrhoids. - Suspect that recent hematochezia and anemia is likely diverticular in origin but the left colon was not visualized, so a malignancy or other lesion could also be present. However, at current the colon is entirely filled with brown stool and solid stools, so there is no active bleeding. Should self-limited bleeding occur, suspect it will be hemorrhoidal in nature, but if bleeding is persistent then may require repeated evaluation in the Inaptient setting. Colonoscopy had been offered, but deferred due to risks and concerns by the family.   FINAL MICROSCOPIC DIAGNOSIS:   A. DUODENUM, BIOPSY:  - Duodenal mucosa with no significant pathologic findings.  - Negative for increased intraepithelial lymphocytes and villous  architectural changes.   B. STOMACH, BIOPSY:  - Mild chronic gastritis.  Mild reactive gastropathy.  - Warthin-Starry stain is negative for Helicobacter pylori.   C. ESOPHAGUS, BIOPSY:  - Squamous esophageal epithelium with no significant pathologic  findings.  - Negative for increased intraepithelial eosinophils.   D. COLON, LEFT, BIOPSY:  - Colonic mucosa with no significant pathologic findings.  - Negative for active inflammation and other abnormalities.   Current Medications, Allergies, Past Medical History, Past Surgical History, Family History and Social History were reviewed in [employer] record.     Current Outpatient Medications  Medication Sig Dispense Refill   apixaban (ELIQUIS) 5 MG TABS tablet Take 1 tablet (5 mg total) by mouth 2 (two) times daily. 60 tablet 1   atorvastatin (LIPITOR) 40 MG tablet Take 40 mg by mouth daily.     furosemide (LASIX) 40 MG tablet Take 0.5 tablets (20 mg total) by mouth daily. 14 tablet 0   LORazepam (ATIVAN) 0.5 MG tablet Take 0.25 mg by mouth 2 (two)  times daily as needed for anxiety.     metoprolol tartrate (LOPRESSOR) 25 MG tablet Take 1 tablet (25 mg total) by mouth 2 (two) times daily. 60 tablet 0   Multiple Vitamin (MULTIVITAMIN) tablet Take 1 tablet by mouth daily with breakfast.     Nystatin (GERHARDT'S BUTT CREAM) CREA Apply 1 application topically daily as needed for irritation. 1 each 0   pantoprazole (PROTONIX) 40 MG tablet TAKE 1 TABLET(40 MG) BY MOUTH TWICE DAILY (Patient taking differently: Take 40 mg by mouth daily before breakfast.) 180 tablet 2   polyethylene glycol (MIRALAX / GLYCOLAX) 17 g packet Take 17 g by mouth daily as needed (for constipation- mix into 4 to 8 ounces of fluid).     potassium chloride (KLOR-CON) 20 MEQ tablet Take 1 tablet (20 mEq total) by mouth daily. 14 tablet 0   Probiotic Product (PROBIOTIC ADVANCED PO) Take 500 mg by mouth daily in the afternoon.     tamsulosin (FLOMAX) 0.4 MG CAPS capsule Take 0.4 mg by mouth in the morning.     vitamin B-12 (CYANOCOBALAMIN) 100 MCG tablet Take 100 mcg by mouth daily.     No current facility-administered medications for this visit.    Review of Systems: No chest pain. No  shortness of breath. No urinary complaints.   PHYSICAL EXAM :    Wt Readings from Last 3 Encounters:  02/07/21 177 lb (80.3 kg)  12/26/20 180 lb 12.4 oz (82 kg)  11/13/20 180 lb (81.6 kg)    BP (!) 142/82   Pulse 90   Ht 5\' 10"  (1.778 m)   Wt 177 lb (80.3 kg)   SpO2 99%   BMI 25.40 kg/m  Constitutional:  Generally well appearing male in no acute distress. Psychiatric: pleasantly confused.  EENT: Pupils normal.  Conjunctivae are normal. No scleral icterus. Neck supple.  Cardiovascular: Normal rate. Pulmonary/chest: Effort normal and breath sounds normal. No wheezing, rales or rhonchi. Abdominal: Limited exam in chair . Abdomen soft, nondistended, nontender. Bowel sounds active throughout. Tye Savoy, NP  02/07/2021, 2:48 PM  Cc:  Lajean Manes, MD

## 2021-02-08 ENCOUNTER — Encounter: Payer: Self-pay | Admitting: Nurse Practitioner

## 2021-02-10 NOTE — Progress Notes (Signed)
Attending Physician's Attestation   I have reviewed the chart.   I agree with the Advanced Practitioner's note, impression, and recommendations with any updates as below. Hopefully, patient will not have any recurrent bleeding now that he has restarted Eliquis.  Time will tell.  Reasonable to decrease PPI to lowest effective dose but in setting of age and medical comorbidities, reasonable to maintain once daily dosing at minimum.   Corliss Parish, MD  Gastroenterology Advanced Endoscopy Office # [phone]

## 2021-02-23 ENCOUNTER — Encounter (HOSPITAL_COMMUNITY): Payer: Self-pay

## 2021-02-23 ENCOUNTER — Emergency Department (HOSPITAL_COMMUNITY)
Admission: EM | Admit: 2021-02-23 | Discharge: 2021-02-23 | Disposition: A | Discharge status: Skilled Nursing Facility | Payer: Medicare Other | Attending: Emergency Medicine | Admitting: Emergency Medicine

## 2021-02-23 ENCOUNTER — Other Ambulatory Visit: Payer: Self-pay

## 2021-02-23 ENCOUNTER — Emergency Department (HOSPITAL_COMMUNITY): Payer: Medicare Other

## 2021-02-23 DIAGNOSIS — I10 Essential (primary) hypertension: Secondary | ICD-10-CM | POA: Diagnosis not present

## 2021-02-23 DIAGNOSIS — Z79899 Other long term (current) drug therapy: Secondary | ICD-10-CM | POA: Diagnosis not present

## 2021-02-23 DIAGNOSIS — S0003XA Contusion of scalp, initial encounter: Secondary | ICD-10-CM | POA: Diagnosis not present

## 2021-02-23 DIAGNOSIS — S0990XA Unspecified injury of head, initial encounter: Secondary | ICD-10-CM | POA: Diagnosis present

## 2021-02-23 DIAGNOSIS — G309 Alzheimer's disease, unspecified: Secondary | ICD-10-CM | POA: Diagnosis not present

## 2021-02-23 DIAGNOSIS — Z7901 Long term (current) use of anticoagulants: Secondary | ICD-10-CM | POA: Insufficient documentation

## 2021-02-23 DIAGNOSIS — W01198A Fall on same level from slipping, tripping and stumbling with subsequent striking against other object, initial encounter: Secondary | ICD-10-CM | POA: Insufficient documentation

## 2021-02-23 DIAGNOSIS — Z96649 Presence of unspecified artificial hip joint: Secondary | ICD-10-CM | POA: Diagnosis not present

## 2021-02-23 LAB — CBC
HCT: 37.2 % — ABNORMAL LOW (ref 39.0–52.0)
Hemoglobin: 11.3 g/dL — ABNORMAL LOW (ref 13.0–17.0)
MCH: 25.2 pg — ABNORMAL LOW (ref 26.0–34.0)
MCHC: 30.4 g/dL (ref 30.0–36.0)
MCV: 82.9 fL (ref 80.0–100.0)
Platelets: 189 10*3/uL (ref 150–400)
RBC: 4.49 MIL/uL (ref 4.22–5.81)
RDW: 15.3 % (ref 11.5–15.5)
WBC: 7.6 10*3/uL (ref 4.0–10.5)
nRBC: 0 % (ref 0.0–0.2)

## 2021-02-23 LAB — BASIC METABOLIC PANEL
Anion gap: 8 (ref 5–15)
BUN: 21 mg/dL (ref 8–23)
CO2: 25 mmol/L (ref 22–32)
Calcium: 9 mg/dL (ref 8.9–10.3)
Chloride: 107 mmol/L (ref 98–111)
Creatinine, Ser: 1.11 mg/dL (ref 0.61–1.24)
GFR, Estimated: >60 mL/min (ref >60)
Glucose, Bld: 109 mg/dL — ABNORMAL HIGH (ref 70–99)
Potassium: 3.7 mmol/L (ref 3.5–5.1)
Sodium: 140 mmol/L (ref 135–145)

## 2021-02-23 IMAGING — CT CT CERVICAL SPINE W/O CM
3 series · 11 of 35 positions shown, 13 images · non-contrast
Comparison: None.

CLINICAL DATA: Neck trauma (Age >= 65y).  Fall

EXAM:
CT CERVICAL SPINE WITHOUT CONTRAST
TECHNIQUE: Multidetector CT imaging of the cervical spine was performed without
intravenous contrast. Multiplanar CT image reconstructions were also
generated.

[Series 5: c spine soft · axial · 0.39mm/px · z∈[-247,-145]mm · 3 of 84 slices shown, 4 images]
[im 20/84  soft-tissue]
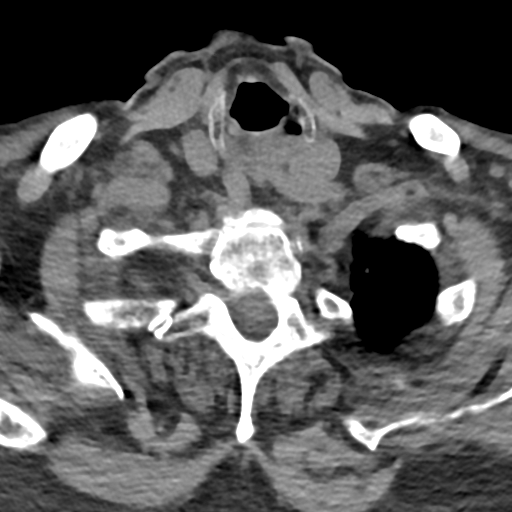
[im 20/84  bone]
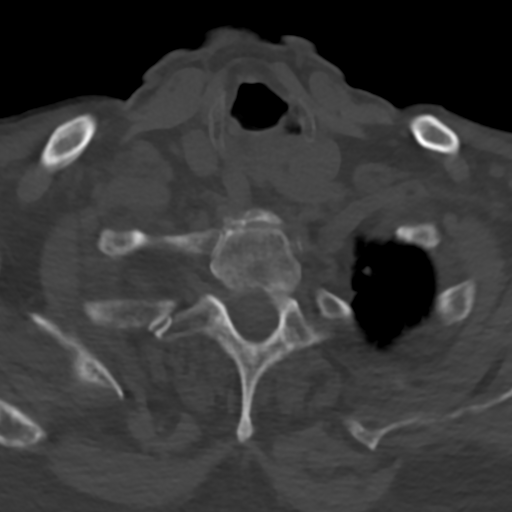
[im 45/84  bone]
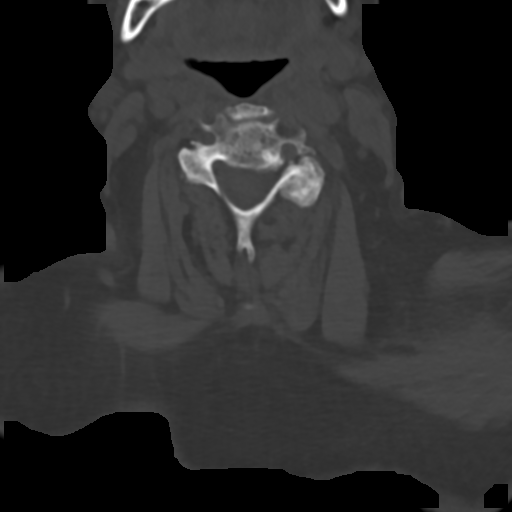
[im 71/84  bone]
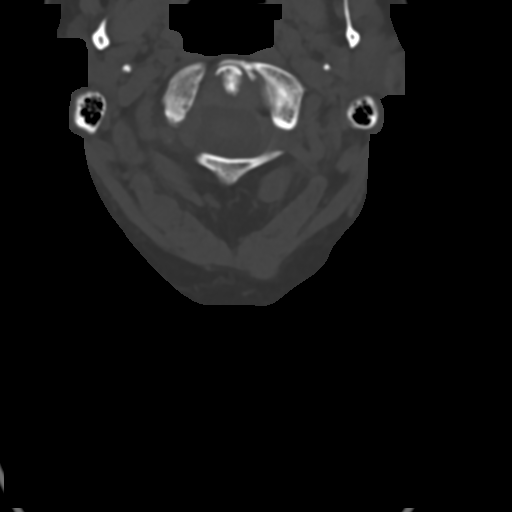

[Series 8: sag bone · sagittal · 0.26mm/px · 5 of 48 slices shown, 6 images]
[im 16/48  bone]
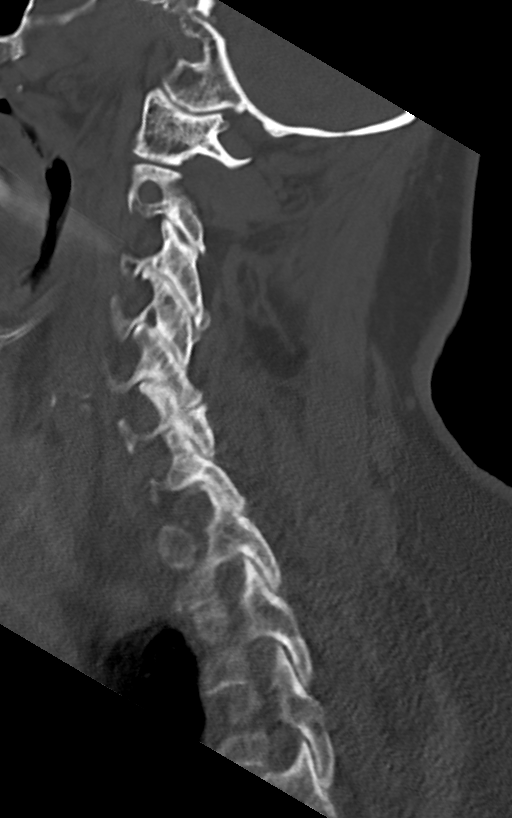
[im 20/48  bone]
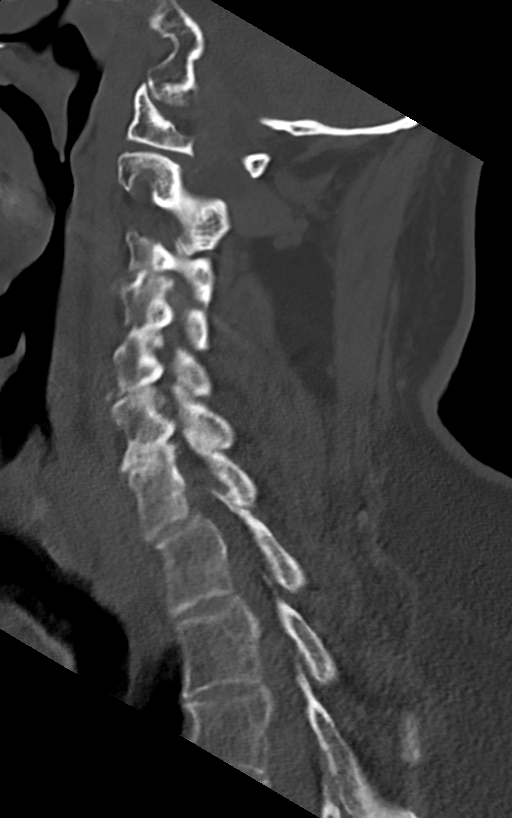
[im 24/48  soft-tissue]
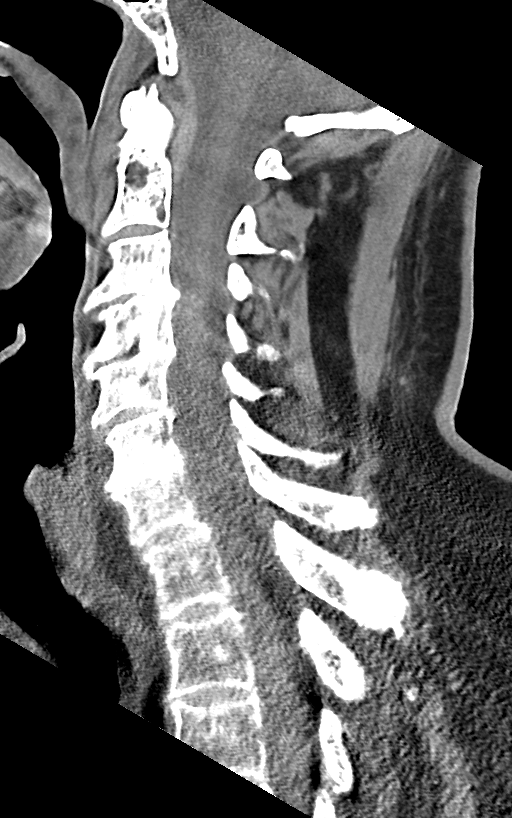
[im 24/48  bone]
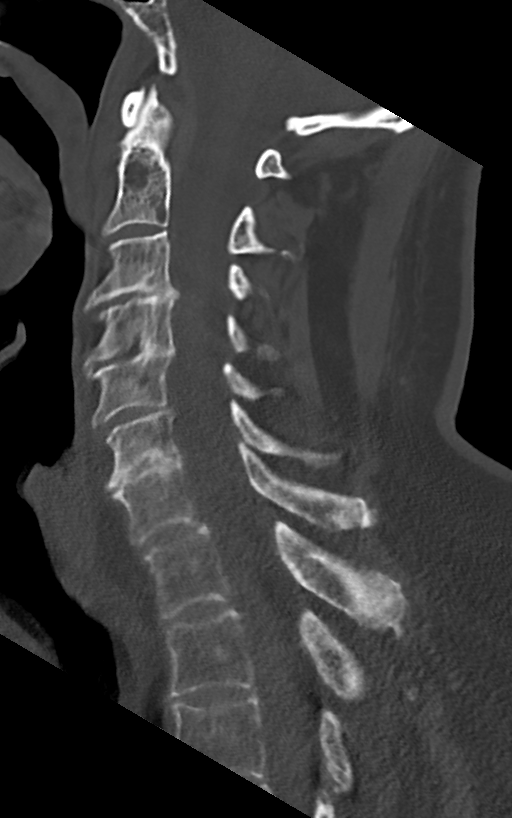
[im 28/48  bone]
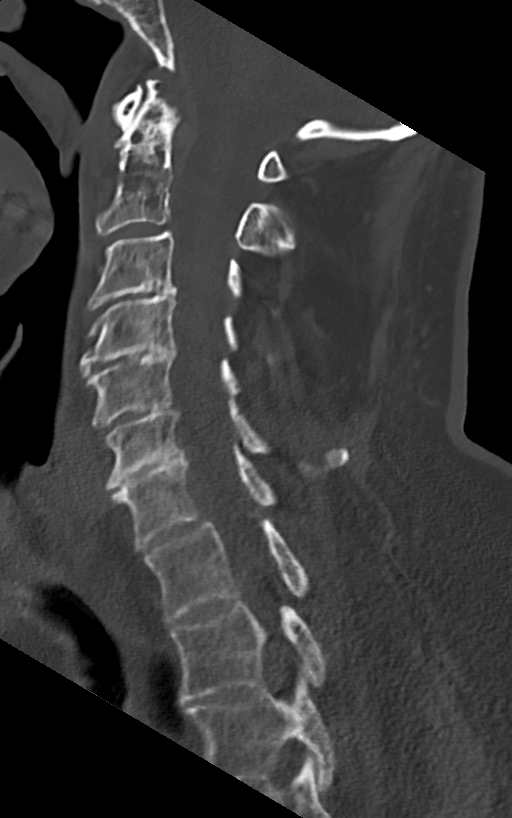
[im 32/48  bone]
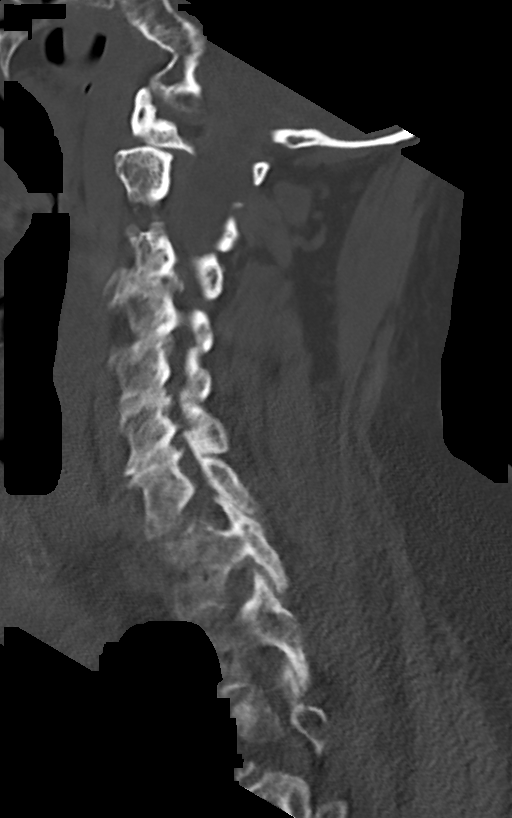

[Series 9: cor bone · coronal · 0.19mm/px · 3 of 68 slices shown]
[im 19/68  bone]
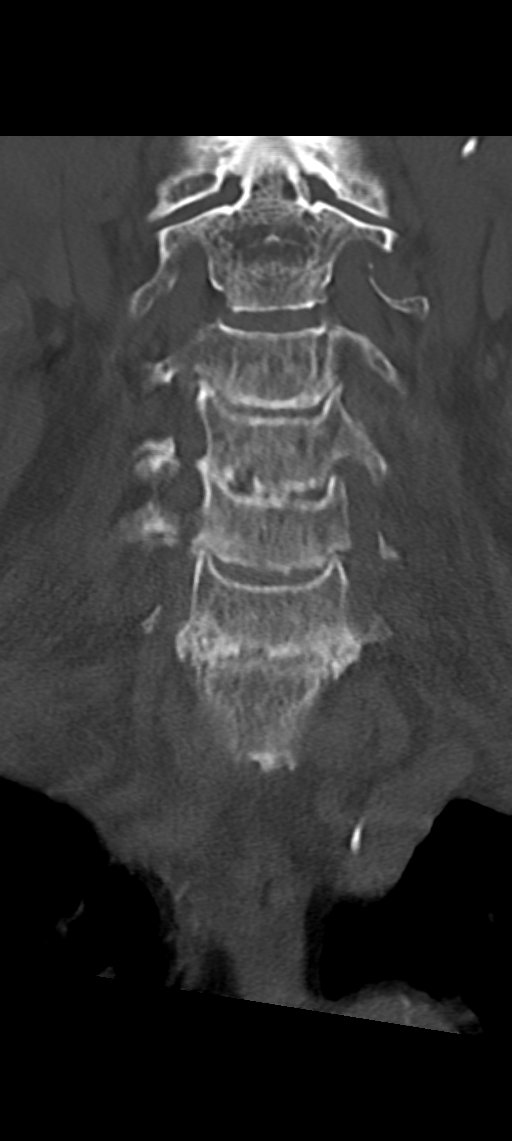
[im 29/68  bone]
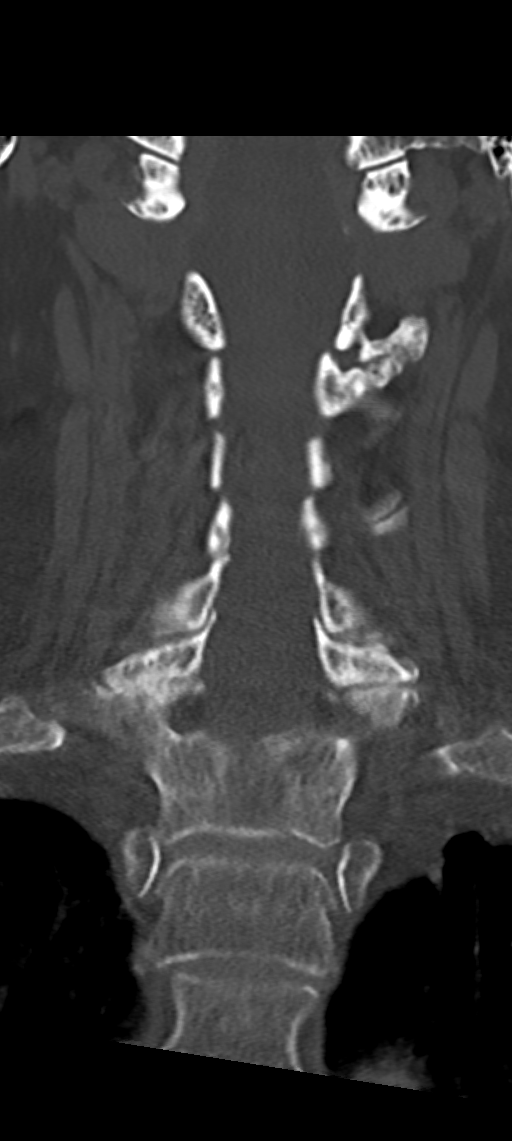
[im 39/68  bone]
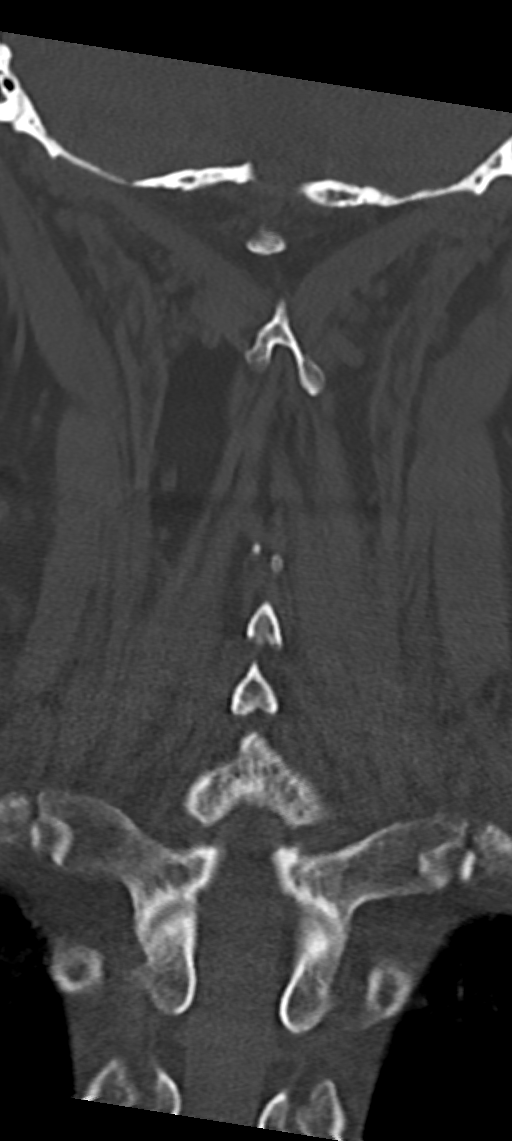

[11 of 35 positions shown; findings below may reference images not displayed]

FINDINGS: Alignment: Slight degenerative anterolisthesis of C7 on T1.

Skull base and vertebrae: No acute fracture. No primary bone lesion
or focal pathologic process.

Soft tissues and spinal canal: Choose 1

Disc levels:  Diffuse degenerative disc disease and facet disease.

Upper chest: No acute findings

Other: None
IMPRESSION: Degenerative disc and facet disease.

No acute bony abnormality.

## 2021-02-23 IMAGING — CT CT HEAD W/O CM
4 series · 16 of 47 positions shown, 18 images · non-contrast
Comparison: [DATE]

CLINICAL DATA: Head trauma, moderate-severe. Fall, on blood
thinners

EXAM:
CT HEAD WITHOUT CONTRAST
TECHNIQUE: Contiguous axial images were obtained from the base of the skull
through the vertex without intravenous contrast.

[Series 3: head wo · axial · 0.42mm/px · z∈[-128,-13]mm · 7 of 31 slices shown, 9 images]
[im 4/31  brain]
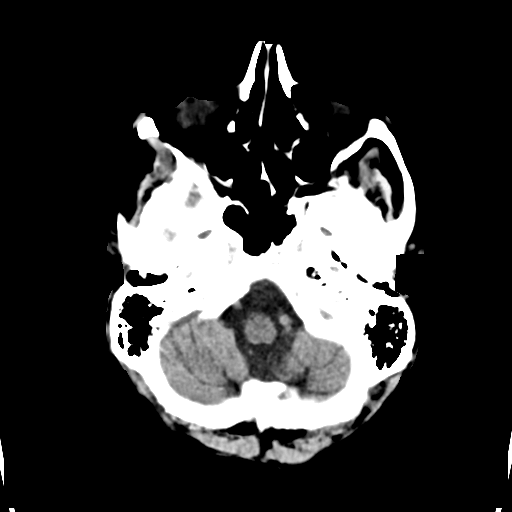
[im 4/31  bone]
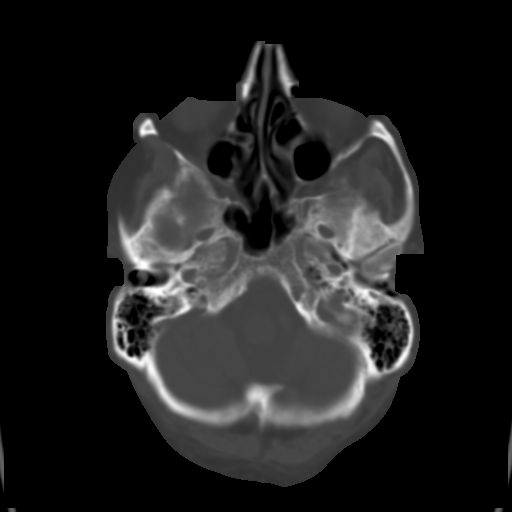
[im 8/31  brain]
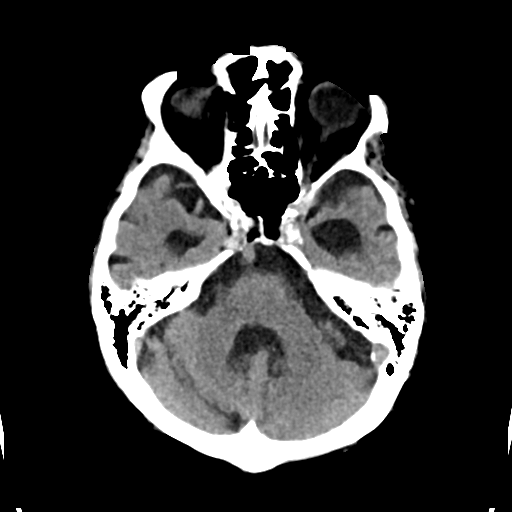
[im 12/31  brain]
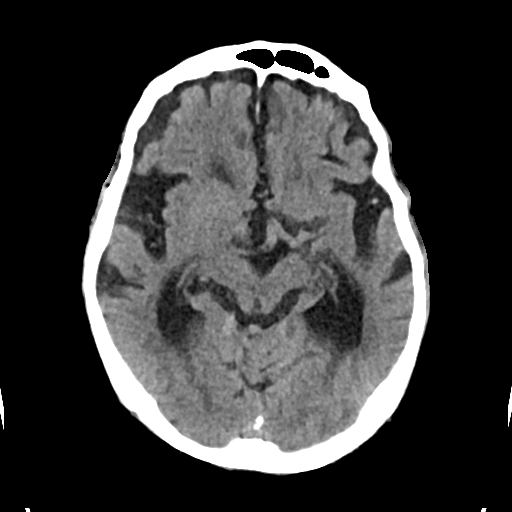
[im 16/31  brain]
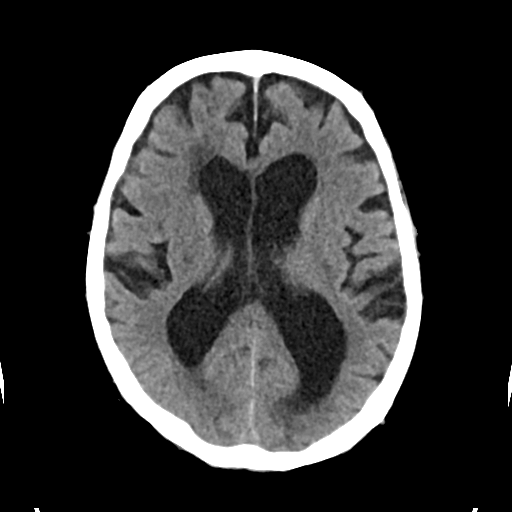
[im 19/31  brain]
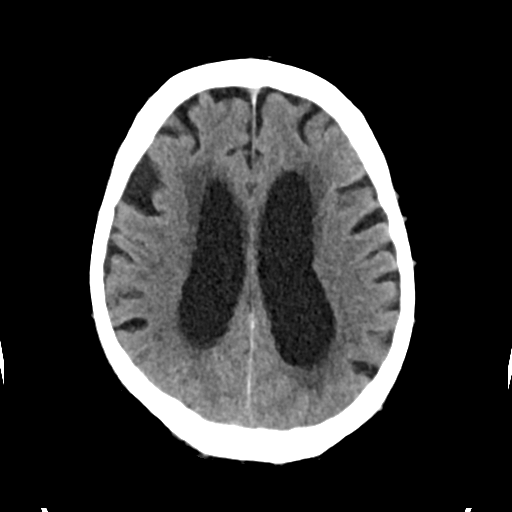
[im 19/31  bone]
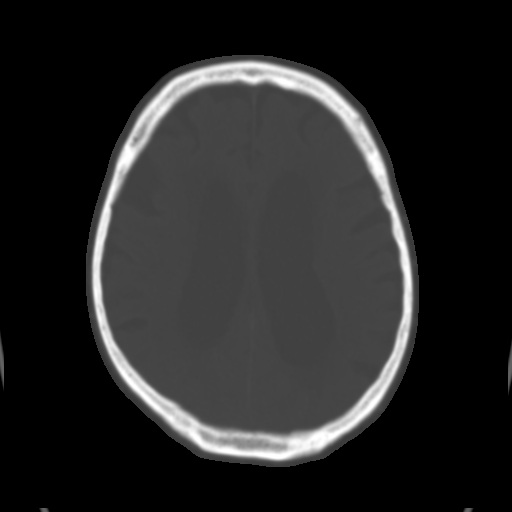
[im 23/31  brain]
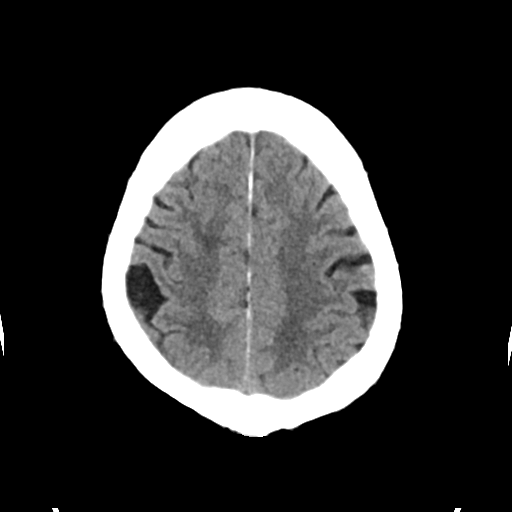
[im 27/31  brain]
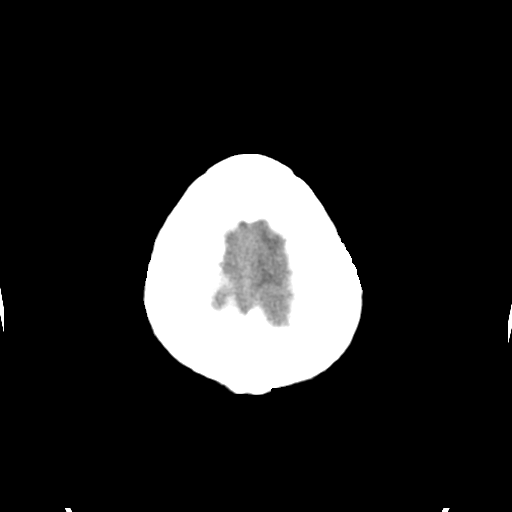

[Series 4: head bone · axial · 0.42mm/px · z∈[-129,-99]mm · 3 of 77 slices shown]
[im 8/77  bone]
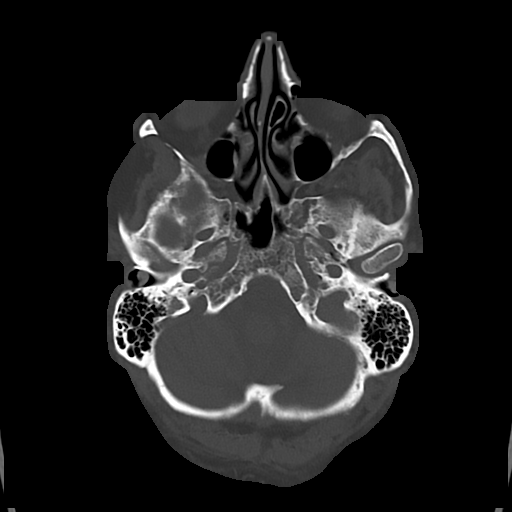
[im 16/77  bone]
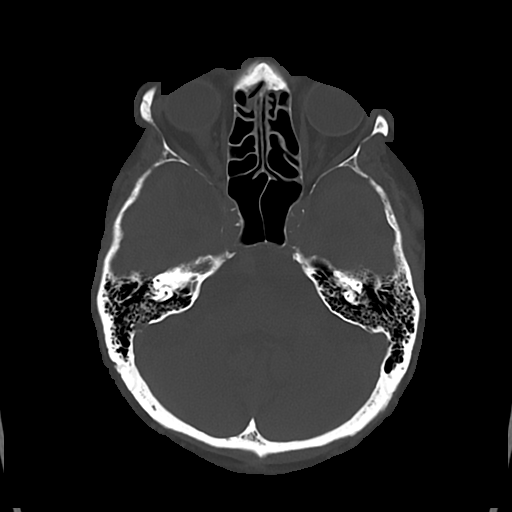
[im 23/77  bone]
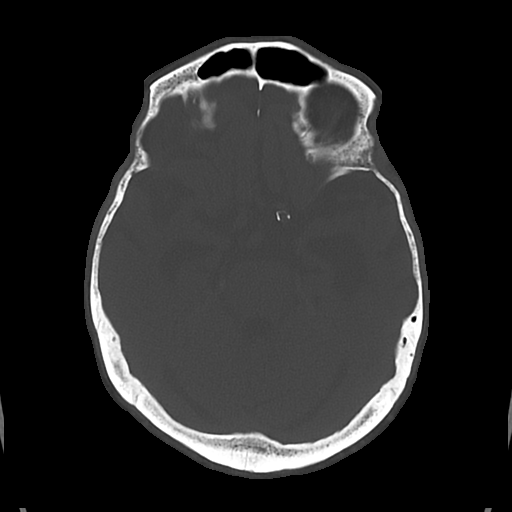

[Series 5: cor soft · coronal · 0.29mm/px · 3 of 69 slices shown]
[im 23/69  brain]
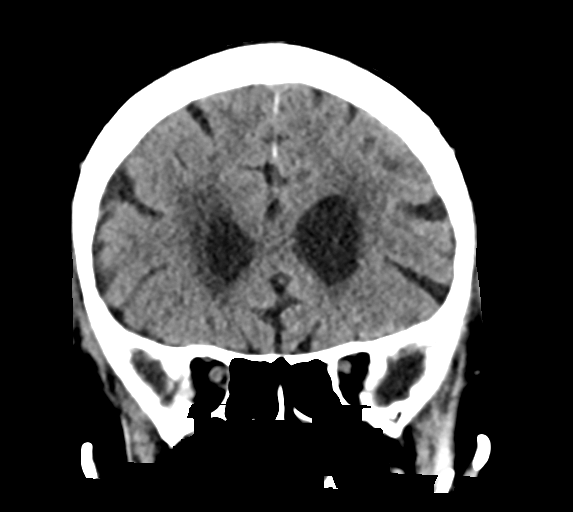
[im 31/69  brain]
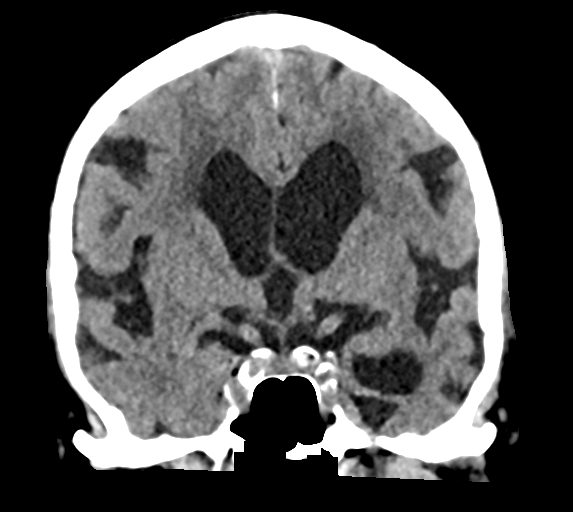
[im 38/69  brain]
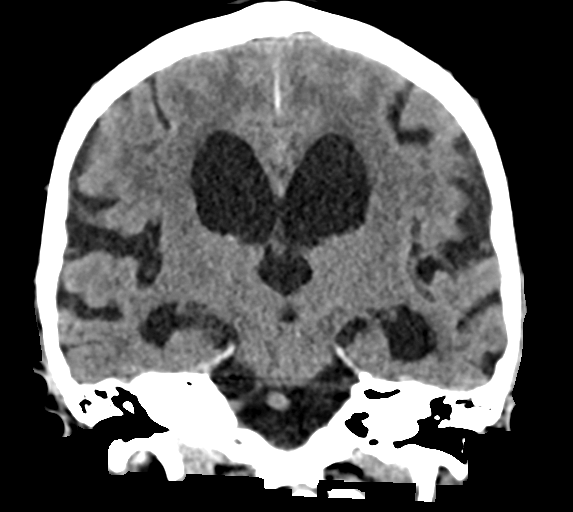

[Series 6: sag soft · sagittal · 0.29mm/px · 3 of 55 slices shown]
[im 19/55  brain]
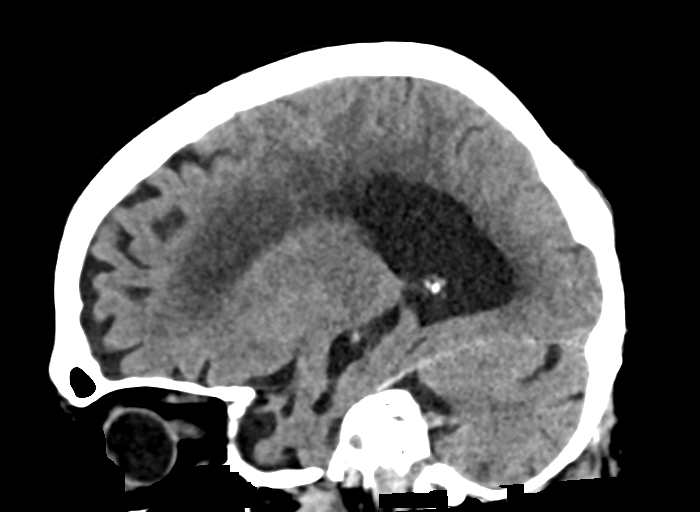
[im 28/55  brain]
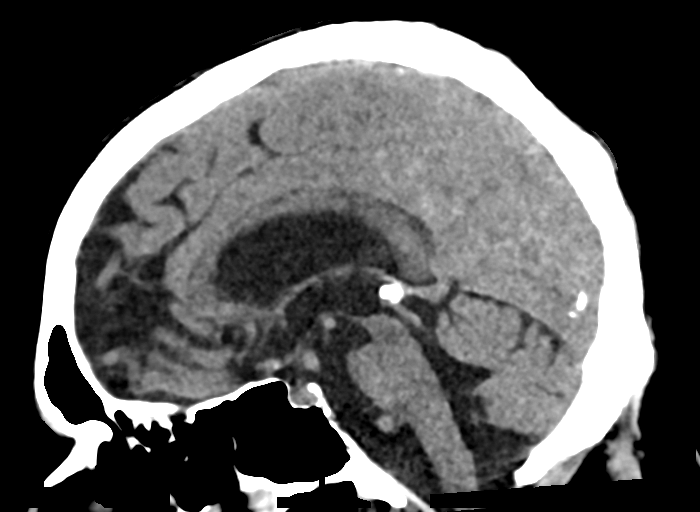
[im 37/55  brain]
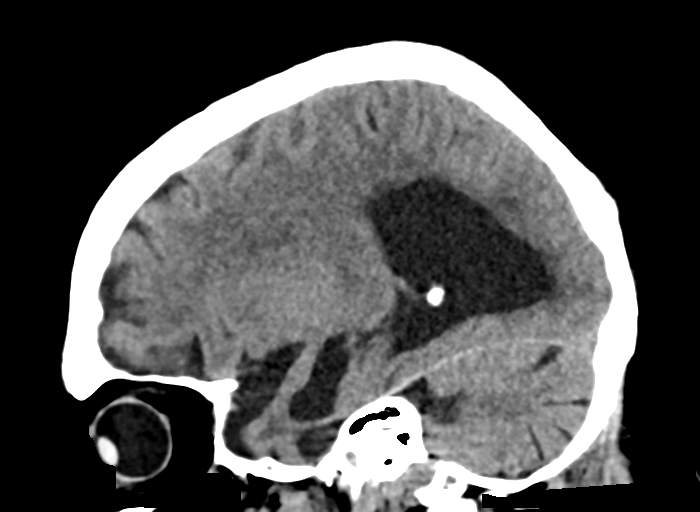

[16 of 47 positions shown; findings below may reference images not displayed]

FINDINGS: Brain: There is atrophy and chronic small vessel disease changes.
Associated ventriculomegaly likely related to ex vacuo dilatation.
No acute intracranial abnormality. Specifically, no hemorrhage, mass
lesion, acute infarction, or significant intracranial injury.

Vascular: No hyperdense vessel or unexpected calcification.

Skull: No acute calvarial abnormality.

Sinuses/Orbits: Visualized paranasal sinuses and mastoids clear.
Orbital soft tissues unremarkable.

Other: None
IMPRESSION: Atrophy, chronic microvascular disease.

No acute intracranial abnormality.

Ventriculomegaly, likely related to ex vacuo dilatation. Difficult
to completely exclude normal pressure hydrocephalus. Recommend
clinical correlation for signs and symptoms.

## 2021-02-23 NOTE — Discharge Instructions (Addendum)
Take over-the-counter medications as needed for pain.  Return to the ER for difficulties with confusion fever or weakness.

## 2021-02-23 NOTE — Progress Notes (Signed)
°   02/23/21 0800  Clinical Encounter Type  Visited With Patient not available  Visit Type Initial;Trauma  Referral From Nurse  Consult/Referral To Chaplain   Chaplain responded to Trauma alert.  No family or support present.  Chaplain remains available.

## 2021-02-23 NOTE — ED Triage Notes (Signed)
Pt bib [facility] from [facility] memory care unit with complaints of an unwitnessed fall. Pt got out of the and fell to the floor. Pt does take Eliquis for afib and is baseline AOx4 per facility and can be combative. Pt arrives to ED with no complaints of pain and refuses C-Collar. Pt alert to self and location. VSS

## 2021-02-23 NOTE — ED Provider Notes (Signed)
[facility] EMERGENCY DEPARTMENT Provider Note   CSN: [id number] Arrival date & time: 02/23/21  0801     History Chief complaint: Fall, head injury  Ryan [name] is a 86 y.o. male.  HPI  Patient presents to the ED for evaluation after fall.  Patient was activated as a level 2 trauma by EMS.  Patient resides at a memory care unit of a nursing facility.  Patient had a witnessed fall where he appeared to trip and fell striking the back of his head.  Patient does have history of anticoagulant use.  No known loss of consciousness.  Patient himself is not sure why he is here, he denies any specific complaints.  No complaints of headache.  No numbness or weakness.  No chest pain or shortness of breath.  No abdominal pain.  Additional history from family.  Unclear if the patient has had any urinary symptoms.  He specifically does not complain of any pain or discomfort.  However his dementia does significantly affect his parents  Past Medical History:  Diagnosis Date   Alzheimer disease (HCC)    Benign prostatic hyperplasia 01/30/2020   Chronic anticoagulation    On eliquis   Gait disorder    GI bleed    Hypercholesterolemia    Hypertension    Mixed hyperlipidemia 01/30/2020   Paroxysmal atrial fibrillation [facility])     Patient Active Problem List   Diagnosis Date Noted   TIA (transient ischemic attack) 01/15/2021   GI bleeding 12/26/2020   Prolonged QT interval 12/26/2020   PAF (paroxysmal atrial fibrillation) (HCC) 02/15/2020   Alzheimer disease (HCC) 02/15/2020   GI bleed 01/31/2020   Lower GI bleed    Primary hypertension    Acute GI bleeding 01/30/2020   Mixed hyperlipidemia 01/30/2020   Benign prostatic hyperplasia 01/30/2020   Anemia due to acute blood loss 01/30/2020   AF (paroxysmal atrial fibrillation) (HCC) 01/30/2020    Past Surgical History:  Procedure Laterality Date   BIOPSY  02/05/2020   Procedure: BIOPSY;  Surgeon: Lemar Lofty.,  MD;  Location: [facility] ENDOSCOPY;  Service: Gastroenterology;;   CHOLECYSTECTOMY     ESOPHAGOGASTRODUODENOSCOPY (EGD) WITH PROPOFOL N/A 02/05/2020   Procedure: ESOPHAGOGASTRODUODENOSCOPY (EGD) WITH PROPOFOL;  Surgeon: Lemar Lofty., MD;  Location: [facility] ENDOSCOPY;  Service: Gastroenterology;  Laterality: N/A;   FLEXIBLE SIGMOIDOSCOPY N/A 02/05/2020   Procedure: FLEXIBLE SIGMOIDOSCOPY;  Surgeon: Meridee Score Netty Starring., MD;  Location: [facility] ENDOSCOPY;  Service: Gastroenterology;  Laterality: N/A;   SAVORY DILATION N/A 02/05/2020   Procedure: SAVORY DILATION;  Surgeon: Meridee Score Netty Starring., MD;  Location: [facility] ENDOSCOPY;  Service: Gastroenterology;  Laterality: N/A;   TOTAL HIP ARTHROPLASTY         No family history on file.  Social History   Tobacco Use   Smoking status: Never   Smokeless tobacco: Never   Tobacco comments:    maybe in 20's  Substance Use Topics   Alcohol use: Not Currently   Drug use: Never    Home Medications Prior to Admission medications   Medication Sig Start Date End Date Taking? Authorizing Provider  apixaban (ELIQUIS) 5 MG TABS tablet Take 1 tablet (5 mg total) by mouth 2 (two) times daily. 01/17/21   Ghimire, Werner Lean, MD  atorvastatin (LIPITOR) 40 MG tablet Take 40 mg by mouth daily.    [provider]  furosemide (LASIX) 40 MG tablet Take 0.5 tablets (20 mg total) by mouth daily. 01/17/21 01/17/22  Maretta Bees, MD  LORazepam (ATIVAN) 0.5 MG tablet Take 0.25 mg by mouth 2 (two) times daily as needed for anxiety. 01/03/21   [provider]  metoprolol tartrate (LOPRESSOR) 25 MG tablet Take 1 tablet (25 mg total) by mouth 2 (two) times daily. 01/17/21 04/17/21  GhimireHenreitta Leber, MD  Multiple Vitamin (MULTIVITAMIN) tablet Take 1 tablet by mouth daily with breakfast.    [provider]  Nystatin (GERHARDT'S BUTT CREAM) CREA Apply 1 application topically daily as needed for irritation. 01/17/21   Ghimire, Henreitta Leber, MD   pantoprazole (PROTONIX) 40 MG tablet Take 1 tablet (40 mg total) by mouth daily. 02/07/21   Willia Craze, NP  polyethylene glycol (MIRALAX / GLYCOLAX) 17 g packet Take 17 g by mouth daily as needed (for constipation- mix into 4 to 8 ounces of fluid). 02/14/20   [provider]  potassium chloride (KLOR-CON) 20 MEQ tablet Take 1 tablet (20 mEq total) by mouth daily. 01/17/21   Ghimire, Henreitta Leber, MD  Probiotic Product (PROBIOTIC ADVANCED PO) Take 500 mg by mouth daily in the afternoon.    [provider]  tamsulosin (FLOMAX) 0.4 MG CAPS capsule Take 0.4 mg by mouth in the morning.    [provider]  vitamin B-12 (CYANOCOBALAMIN) 100 MCG tablet Take 100 mcg by mouth daily.    [provider]    Allergies    Patient has no known allergies.  Review of Systems   Review of Systems  All other systems reviewed and are negative.  Physical Exam Updated Vital Signs BP 122/70 (BP Location: Right Arm)    Pulse 86    Temp (!) 97.3 F (36.3 C) (Axillary)    Resp 18    Ht 1.778 m (5\' 10" )    Wt 86.2 kg    SpO2 98%    BMI 27.26 kg/m   Physical Exam Vitals and nursing note reviewed.  Constitutional:      General: He is not in acute distress.    Appearance: He is well-developed.  HENT:     Head: Normocephalic.     Comments: Small hematoma palpable right posterior occiput    Right Ear: External ear normal.     Left Ear: External ear normal.  Eyes:     General: No scleral icterus.       Right eye: No discharge.        Left eye: No discharge.     Conjunctiva/sclera: Conjunctivae normal.  Neck:     Trachea: No tracheal deviation.  Cardiovascular:     Rate and Rhythm: Normal rate and regular rhythm.  Pulmonary:     Effort: Pulmonary effort is normal. No respiratory distress.     Breath sounds: Normal breath sounds. No stridor. No wheezing or rales.  Abdominal:     General: Bowel sounds are normal. There is no distension.     Palpations: Abdomen is soft.      Tenderness: There is no abdominal tenderness. There is no guarding or rebound.  Musculoskeletal:        General: No tenderness or deformity.     Cervical back: Neck supple.     Comments: Entire spine nontender, no tenderness to palpation in the upper or lower extremities  Skin:    General: Skin is warm and dry.     Findings: No rash.  Neurological:     General: No focal deficit present.     Mental Status: He is alert.     Cranial Nerves: No cranial  nerve deficit (no facial droop, extraocular movements intact, no slurred speech).     Sensory: No sensory deficit.     Motor: No abnormal muscle tone or seizure activity.     Coordination: Coordination normal.  Psychiatric:        Mood and Affect: Mood normal.    ED Results / Procedures / Treatments   Labs (all labs ordered are listed, but only abnormal results are displayed) Labs Reviewed  CBC - Abnormal; Notable for the following components:      Result Value   Hemoglobin 11.3 (*)    HCT 37.2 (*)    MCH 25.2 (*)    All other components within normal limits  BASIC METABOLIC PANEL - Abnormal; Notable for the following components:   Glucose, Bld 109 (*)    All other components within normal limits    EKG EKG Interpretation  Date/Time:  Saturday February 23 2021 08:15:40 EST Ventricular Rate:  98 PR Interval:    QRS Duration: 106 QT Interval:  410 QTC Calculation: 524 R Axis:   51 Text Interpretation: Atrial fibrillation Paired ventricular premature complexes Aberrant conduction of SV complex(es) Borderline repol abnormality, diffuse leads Prolonged QT interval No significant change since last tracing Confirmed by Dorie Rank [phone]) on 02/23/2021 8:17:59 AM  Radiology CT Head Wo Contrast  Result Date: 02/23/2021 CLINICAL DATA:  Head trauma, moderate-severe. Fall, on blood thinners EXAM: CT HEAD WITHOUT CONTRAST TECHNIQUE: Contiguous axial images were obtained from the base of the skull through the vertex without  intravenous contrast. COMPARISON:  01/15/2021 FINDINGS: Brain: There is atrophy and chronic small vessel disease changes. Associated ventriculomegaly likely related to ex vacuo dilatation. No acute intracranial abnormality. Specifically, no hemorrhage, mass lesion, acute infarction, or significant intracranial injury. Vascular: No hyperdense vessel or unexpected calcification. Skull: No acute calvarial abnormality. Sinuses/Orbits: Visualized paranasal sinuses and mastoids clear. Orbital soft tissues unremarkable. Other: None IMPRESSION: Atrophy, chronic microvascular disease. No acute intracranial abnormality. Ventriculomegaly, likely related to ex vacuo dilatation. Difficult to completely exclude normal pressure hydrocephalus. Recommend clinical correlation for signs and symptoms. Electronically Signed   By: Rolm Baptise M.D.   On: 02/23/2021 08:52   CT Cervical Spine Wo Contrast  Result Date: 02/23/2021 CLINICAL DATA:  Neck trauma (Age >= 65y).  Fall EXAM: CT CERVICAL SPINE WITHOUT CONTRAST TECHNIQUE: Multidetector CT imaging of the cervical spine was performed without intravenous contrast. Multiplanar CT image reconstructions were also generated. COMPARISON:  None. FINDINGS: Alignment: Slight degenerative anterolisthesis of C7 on T1. Skull base and vertebrae: No acute fracture. No primary bone lesion or focal pathologic process. Soft tissues and spinal canal: Choose 1 Disc levels:  Diffuse degenerative disc disease and facet disease. Upper chest: No acute findings Other: None IMPRESSION: Degenerative disc and facet disease. No acute bony abnormality. Electronically Signed   By: Rolm Baptise M.D.   On: 02/23/2021 08:53    Procedures Procedures   Medications Ordered in ED Medications - No data to display  ED Course  I have reviewed the triage vital signs and the nursing notes.  Pertinent labs & imaging results that were available during my care of the patient were reviewed by me and considered in my  medical decision making (see chart for details).  Clinical Course as of 02/23/21 0920  Sat Feb 23, 2021  0859 CBC shows stable anemia.  Metabolic panel unremarkable. [JK]  0900 CT C-spine without acute findings.  Head CT shows ventriculomegaly.  Presentation not clinically suggestive of normal pressure hydrocephalus.  Patient was seen by neurology in November of this year during a hospitalization. will recommend follow-up with PCP. [JK]    Clinical Course User Index [JK] Dorie Rank, MD   MDM Rules/Calculators/A&P                         Patient presented with complaints of tripping and falling.  Patient did strike his head and is on anticoagulants.  Patient is at his baseline in the ED.  Family is present to confirm.  No signs of serious injury associated with his fall.  No signs of anemia or severe dehydration.  Patient appears stable for discharge back to his facility    Final Clinical Impression(s) / ED Diagnoses Final diagnoses:  Contusion of scalp, initial encounter    Rx / DC Orders ED Discharge Orders     None        Dorie Rank, MD 02/23/21 [phone]

## 2021-02-23 NOTE — ED Notes (Signed)
Back from CT

## 2021-02-23 NOTE — ED Notes (Signed)
Patient transported to CT 

## 2021-02-23 NOTE — ED Notes (Signed)
Discharge given to family. Family taking pt back to facility

## 2021-02-23 NOTE — ED Notes (Signed)
Got patient undressed on the monitor patient got patient some warm blankets patient is resting with call bell in reach

## 2021-02-23 NOTE — Progress Notes (Signed)
Orthopedic Tech Progress Note Patient Details:  Ryan [name] Jun 08, 1934 [id number]  Level 2 trauma.  Patient ID: Ryan [name], male   DOB: Aug 31, 1934, 86 y.o.   MRN: [id number]  Docia Furl 02/23/2021, 10:22 AM

## 2021-03-18 ENCOUNTER — Encounter: Payer: Self-pay | Admitting: Diagnostic Neuroimaging

## 2021-03-18 ENCOUNTER — Ambulatory Visit (INDEPENDENT_AMBULATORY_CARE_PROVIDER_SITE_OTHER): Payer: Medicare Other | Admitting: Diagnostic Neuroimaging

## 2021-03-18 VITALS — BP 119/72 | HR 80 | Ht 70.0 in

## 2021-03-18 DIAGNOSIS — F03C0 Unspecified dementia, severe, without behavioral disturbance, psychotic disturbance, mood disturbance, and anxiety: Secondary | ICD-10-CM

## 2021-03-18 DIAGNOSIS — G459 Transient cerebral ischemic attack, unspecified: Secondary | ICD-10-CM

## 2021-03-18 NOTE — Progress Notes (Signed)
[facility]  PATIENT: Ryan [name] DOB: Aug 29, 1934  REFERRING CLINICIAN: Lajean Manes, MD HISTORY FROM: patient's daughter REASON FOR VISIT: follow up   [place]:  Chief Complaint  Patient presents with   Transient Ischemic Attack    Rm [place] dgtr- Nunzio Cory, resides at [facility] at [place]:   UPDATE (03/18/21, VRP): Since last visit, continued decline. Was in hospital in Nov 2022 for TIA. Still at memory care center. More edema in legs. Back in ER in Dec 2022 for a fall and head injury.   UPDATE (11/13/20, VRP): Since last visit, now in independent living at [facility] since Feb 2022, with support of daughters. Still with severe symptoms. Needs help with bathing, dressing, hygiene, feeding, mobility. Cannot function outside of home independently.  PRIOR HPI (01/17/20): 87 year old male here for evaluation of dementia.  Patient is accompanied by his daughters.  Patient lives in [place] but visits [place] to spend time in the winters with daughters.  He was diagnosed with dementia in 2015.  He got married in 2017.  In 2018 he was assigned a guardian by the state.  Patient has very limited insight.  He is calm and pleasant during our evaluation.  ADLs significantly limited.  Needs help with bathing, dressing, toileting, transferring continence.  He is able to feed himself.  Not able to function independently outside at home.  Cannot manage finances.  Not able to drive.  Cannot manage medications.    REVIEW OF SYSTEMS: Full 14 system review of systems performed and negative with exception of: As per HPI.  ALLERGIES: No Known Allergies  HOME MEDICATIONS: Outpatient Medications Prior to Visit  Medication Sig Dispense Refill   apixaban (ELIQUIS) 5 MG TABS tablet Take 1 tablet (5 mg total) by mouth 2 (two) times daily. 60 tablet 1   atorvastatin (LIPITOR) 40 MG tablet Take 40 mg by mouth daily.      furosemide (LASIX) 20 MG tablet Take 20 mg by mouth daily.     LORazepam (ATIVAN) 0.5 MG tablet Take 0.25 mg by mouth 2 (two) times daily as needed for anxiety.     metoprolol tartrate (LOPRESSOR) 25 MG tablet Take 1 tablet (25 mg total) by mouth 2 (two) times daily. 60 tablet 0   Multiple Vitamin (MULTIVITAMIN) tablet Take 1 tablet by mouth daily with breakfast.     Nystatin (GERHARDT'S BUTT CREAM) CREA Apply 1 application topically daily as needed for irritation. 1 each 0   pantoprazole (PROTONIX) 40 MG tablet Take 1 tablet (40 mg total) by mouth daily. 90 tablet 2   potassium chloride (KLOR-CON) 20 MEQ tablet Take 1 tablet (20 mEq total) by mouth daily. 14 tablet 0   Probiotic Product (PROBIOTIC ADVANCED PO) Take 500 mg by mouth daily in the afternoon.     Psyllium (METAMUCIL) 48.57 % POWD See admin instructions. daily     tamsulosin (FLOMAX) 0.4 MG CAPS capsule Take 0.4 mg by mouth in the morning.     vitamin B-12 (CYANOCOBALAMIN) 100 MCG tablet Take 100 mcg by mouth daily.     polyethylene glycol (MIRALAX / GLYCOLAX) 17 g packet Take 17 g by mouth daily as needed (for constipation- mix into 4 to 8 ounces of fluid). (Patient not taking: Reported on 03/18/2021)     furosemide (LASIX) 40 MG tablet Take 0.5 tablets (20 mg total) by mouth daily. 14 tablet 0   No facility-administered medications prior  to visit.    PAST MEDICAL HISTORY: Past Medical History:  Diagnosis Date   Alzheimer disease ([place])    Benign prostatic hyperplasia 01/30/2020   Chronic anticoagulation    On eliquis   Dementia ([place])    Gait disorder    GI bleed    Hypercholesterolemia    Hypertension    Mixed hyperlipidemia 01/30/2020   Paroxysmal atrial fibrillation (HCC)    TIA (transient ischemic attack) 01/2021    PAST SURGICAL HISTORY: Past Surgical History:  Procedure Laterality Date   BIOPSY  02/05/2020   Procedure: BIOPSY;  Surgeon: Irving Copas., MD;  Location: [facility] ENDOSCOPY;  Service:  Gastroenterology;;   CHOLECYSTECTOMY     ESOPHAGOGASTRODUODENOSCOPY (EGD) WITH PROPOFOL N/A 02/05/2020   Procedure: ESOPHAGOGASTRODUODENOSCOPY (EGD) WITH PROPOFOL;  Surgeon: Irving Copas., MD;  Location: [place];  Service: Gastroenterology;  Laterality: N/A;   FLEXIBLE SIGMOIDOSCOPY N/A 02/05/2020   Procedure: FLEXIBLE SIGMOIDOSCOPY;  Surgeon: Rush Landmark Telford Nab., MD;  Location: [place];  Service: Gastroenterology;  Laterality: N/A;   SAVORY DILATION N/A 02/05/2020   Procedure: SAVORY DILATION;  Surgeon: Rush Landmark Telford Nab., MD;  Location: [place];  Service: Gastroenterology;  Laterality: N/A;   TOTAL HIP ARTHROPLASTY      FAMILY HISTORY: No family history on file.  SOCIAL HISTORY: Social History   Socioeconomic History   Marital status: Widowed    Spouse name: Not on file   Number of children: 3   Years of education: Not on file   Highest education level: High school graduate  Occupational History    Comment: retired  Tobacco Use   Smoking status: Never   Smokeless tobacco: Never   Tobacco comments:    maybe in 20's  Substance and Sexual Activity   Alcohol use: Not Currently   Drug use: Never   Sexual activity: Not on file  Other Topics Concern   Not on file  Social History Narrative   03/18/21 lives at [facility]   Social Determinants of Health   Financial Resource Strain: Not on file  Food Insecurity: Not on file  Transportation Needs: Not on file  Physical Activity: Not on file  Stress: Not on file  Social Connections: Not on file  Intimate Partner Violence: Not on file     PHYSICAL EXAM  GENERAL EXAM/CONSTITUTIONAL: Vitals:  Vitals:   03/18/21 1347  BP: 119/72  Pulse: 80  Height: 5\' 10"  (1.778 m)   Body mass index is 27.26 kg/m. Wt Readings from Last 3 Encounters:  02/23/21 190 lb (86.2 kg)  02/07/21 177 lb (80.3 kg)  12/26/20 180 lb 12.4 oz (82 kg)   Patient is sleeping in chair; no  distress  MUSCULOSKELETAL: Gait, strength, tone, movements noted in Neurologic exam below  NEUROLOGIC: MENTAL STATUS:  MMSE - [place] Exam 11/13/2020 01/17/2020  Orientation to time 0 0  Orientation to Place 0 0  Registration 3 1  Attention/ Calculation 0 0  Recall 0 0  Language- name 2 objects 2 2  Language- repeat 0 1  Language- follow 3 step command 3 3  Language- read & follow direction 0 1  Write a sentence 0 0  Copy design 0 0  Total score 8 8   awake, alert, oriented to person DECR memory  DECR attention and concentration DECR FLUENCY, COMPREHENSION, INSIGHT fund of knowledge LIMITED   MOTOR:  SEVERE SLEEPINESS; SLUMPED FORWARD IN CHAIR  REFLEXES:  deep tendon reflexes TRACE and symmetric  GAIT/STATION:  IN [facility]  DIAGNOSTIC DATA (LABS, IMAGING, TESTING) - I reviewed patient records, labs, notes, testing and imaging myself where available.  Lab Results  Component Value Date   WBC 7.6 02/23/2021   HGB 11.3 (L) 02/23/2021   HCT 37.2 (L) 02/23/2021   MCV 82.9 02/23/2021   PLT 189 02/23/2021      Component Value Date/Time   NA 140 02/23/2021 0829   K 3.7 02/23/2021 0829   CL 107 02/23/2021 0829   CO2 25 02/23/2021 0829   GLUCOSE 109 (H) 02/23/2021 0829   BUN 21 02/23/2021 0829   CREATININE 1.11 02/23/2021 0829   CALCIUM 9.0 02/23/2021 0829   PROT 6.5 01/15/2021 1319   ALBUMIN 3.5 01/15/2021 1319   AST 19 01/15/2021 1319   ALT 18 01/15/2021 1319   ALKPHOS 90 01/15/2021 1319   BILITOT 0.8 01/15/2021 1319   GFRNONAA >60 02/23/2021 0829   Lab Results  Component Value Date   CHOL 111 01/16/2021   HDL 64 01/16/2021   LDLCALC 38 01/16/2021   TRIG 43 01/16/2021   CHOLHDL 1.7 01/16/2021   Lab Results  Component Value Date   HGBA1C 5.7 (H) 01/16/2021   Lab Results  Component Value Date   [id number] 01/15/2021   Lab Results  Component Value Date   TSH 1.349 01/16/2021     10/17/20 MRI brain 1. No acute intracranial  abnormality. 2. Severe atrophy and findings of chronic microvascular disease.   ASSESSMENT AND PLAN  87 y.o. year old male here with:  Dx:  1. Severe dementia without behavioral disturbance, psychotic disturbance, mood disturbance, or anxiety, unspecified dementia type   2. TIA (transient ischemic attack)      PLAN:  SEVERE DEMENTIA (since 2015; MMSE 8/30; significant decline in ADLs) - safety / supervision issues reviewed - daily physical activity / exercise (at least 15-30 minutes) - eat more plants / vegetables - increase social activities, brain stimulation, games, puzzles, hobbies, crafts, arts, music - aim for at least 7-8 hours sleep per night (or more) - avoid smoking and alcohol - caregiver resources provided - no driving; cannot handle finances or medications - consider palliative care consult  TIA - continue eliquis, atorvastatin  Return for return to PCP, pending if symptoms worsen or fail to improve.    Penni Bombard, MD [id number], [id number] PM Certified in Neurology, Neurophysiology and Neuroimaging  [facility] [address], [place], [place] [address] [phone]

## 2021-03-22 ENCOUNTER — Other Ambulatory Visit: Payer: Self-pay

## 2021-03-22 ENCOUNTER — Non-Acute Institutional Stay: Payer: Medicare Other | Admitting: Hospice

## 2021-03-22 DIAGNOSIS — R600 Localized edema: Secondary | ICD-10-CM

## 2021-03-22 DIAGNOSIS — F01C0 Vascular dementia, severe, without behavioral disturbance, psychotic disturbance, mood disturbance, and anxiety: Secondary | ICD-10-CM

## 2021-03-22 DIAGNOSIS — Z515 Encounter for palliative care: Secondary | ICD-10-CM

## 2021-03-22 NOTE — Progress Notes (Signed)
Therapist, nutritional Palliative Care Consult Note Telephone: [phone]  Fax: [phone]  PATIENT NAME: Ryan [name] DOB: 1935/01/18 MRN: [id number]  PRIMARY CARE PROVIDER:   [name], MD [name], MD [address] [place],  [place] [address]  REFERRING PROVIDER: [name], MD [name], MD [address] [place],  [place] [address]  RESPONSIBLE PARTY:  Ryan [name] is [facility] Spouse:Ryan [name]: [phone] Contact Information     Name Relation Home Work [place], Ryan [name] Legal Guardian   [phone]   Ryan [name] Daughter   [phone]   Ryan [name] Daughter   [phone]   Ryan [name] Daughter   [phone]       Visit is to build trust and highlight Palliative Medicine as specialized medical care for people living with serious illness, aimed at facilitating better quality of life through symptoms relief, assisting with advance care planning and complex medical decision making. Ryan [name] is with patient during visit. Discussed Dementia disease progression. She explained they communicate on patient's status with the legal guardian and with [name]. This is a follow up visit.  RECOMMENDATIONS/PLAN:   Advance Care Planning/Code Status:Patient is a Do Not Resuscitate  Goals of Care: Goals of care include to maximize quality of life and symptom management. Discussion today with clarification of goals of care. She leaned towards comfort care; she will discuss with family and meet with NP in a month to fill out the MOST form  Visit consisted of counseling and education dealing with the complex and emotionally intense issues of symptom management and palliative care in the setting of serious and potentially life-threatening illness. Palliative care team will continue to support patient, patient's family, and medical team.  Symptom management/Plan:  Dementia: Memory loss/confusion is progressive.  FAST  6D, incontinent of bowel and bladder. Safety precautions in place. Followed by Neurologist Afib: Continue Eliquis. Edema in BLE: Patient saw Dr Pete Glatter today, and for routinelab works Lasix increased from 20 mg to 40 mg daily. Patient is followed by a Cardiologist.   Anxiety: Managed with Lorazepam Constipation: Continue Metamucil and Miralax as ordered Gait disturbance: Falls precautions.  Rolling walker and transport chair on hand.  Balance of rest and performance activity Follow up Palliative Care visit: Palliative care will continue to follow for complex medical decision making, advance care planning, and clarification of goals. Return 6 weeks or prn.Encouraged to call provider sooner with any concerns.   CHIEF COMPLAINT: Palliative follow up   HISTORY OF PRESENT ILLNESS:  Ryan [name] is a 87 y.o. year old male  with multiple medical conditions including Alzheimer dementia, edema in BLE,   gait disturbance, HTN, Afib, Hyperlipidemia.   Patient denies pain/discomfort, no cough or shortness of breath. FLACC 0. History obtained from review of EMR, discussion with primary team, family and/or patient. Records reviewed and summarized above. All 10 point systems reviewed and are negative except as documented in history of present illness above  Review and summarization of Epic records shows history from other than patient.   Palliative Care was asked to follow this patient o help address complex decision making in the context of advance care planning and goals of care clarification. I reviewed, as needed, available labs, patient records, imaging, studies and related documents from the EMR.    PHYSICAL EXAM  Constitutional: NAD Height/weight: 5 feet 8 inches/192 Ibs up from 183 Ibs about 3 months ago.  General: Well groomed, cooperative EYES: anicteric sclera, lids intact,  no discharge ENMT: Moist mucous membrane CV: S1 S2, RRR, trace edema to BLE Pulmonary: LCTA, no increased work of  breathing, no cough, Abdomen: active BS + 4 quadrants, soft and non tender, no ascites GU: no suprapubic tenderness MSK: weakness,  limited ROM, ambulatory with rolling walker Skin: warm and dry, no rashes or wounds on visible skin Neuro:  weakness, otherwise non focal, forgetful, memory loss Psych: non-anxious affect Hem/lymph/immuno: no widespread bruising  PERTINENT MEDICATIONS:  Outpatient Encounter Medications as of 03/22/2021  Medication Sig   apixaban (ELIQUIS) 5 MG TABS tablet Take 1 tablet (5 mg total) by mouth 2 (two) times daily.   atorvastatin (LIPITOR) 40 MG tablet Take 40 mg by mouth daily.   furosemide (LASIX) 20 MG tablet Take 20 mg by mouth daily.   LORazepam (ATIVAN) 0.5 MG tablet Take 0.25 mg by mouth 2 (two) times daily as needed for anxiety.   metoprolol tartrate (LOPRESSOR) 25 MG tablet Take 1 tablet (25 mg total) by mouth 2 (two) times daily.   Multiple Vitamin (MULTIVITAMIN) tablet Take 1 tablet by mouth daily with breakfast.   Nystatin (GERHARDT'S BUTT CREAM) CREA Apply 1 application topically daily as needed for irritation.   pantoprazole (PROTONIX) 40 MG tablet Take 1 tablet (40 mg total) by mouth daily.   polyethylene glycol (MIRALAX / GLYCOLAX) 17 g packet Take 17 g by mouth daily as needed (for constipation- mix into 4 to 8 ounces of fluid). (Patient not taking: Reported on 03/18/2021)   potassium chloride (KLOR-CON) 20 MEQ tablet Take 1 tablet (20 mEq total) by mouth daily.   Probiotic Product (PROBIOTIC ADVANCED PO) Take 500 mg by mouth daily in the afternoon.   Psyllium (METAMUCIL) 48.57 % POWD See admin instructions. daily   tamsulosin (FLOMAX) 0.4 MG CAPS capsule Take 0.4 mg by mouth in the morning.   vitamin B-12 (CYANOCOBALAMIN) 100 MCG tablet Take 100 mcg by mouth daily.   No facility-administered encounter medications on file as of 03/22/2021.    HOSPICE ELIGIBILITY/DIAGNOSIS: TBD  PAST MEDICAL HISTORY:  Past Medical History:  Diagnosis Date    Alzheimer disease ([address])    Benign prostatic hyperplasia 01/30/2020   Chronic anticoagulation    On eliquis   Dementia ([place])    Gait disorder    GI bleed    Hypercholesterolemia    Hypertension    Mixed hyperlipidemia 01/30/2020   Paroxysmal atrial fibrillation (HCC)    TIA (transient ischemic attack) 01/2021      ALLERGIES: No Known Allergies    I spent 45 minutes providing this consultation; this includes time spent with patient/family, chart review and documentation. More than 50% of the time in this consultation was spent on counseling and coordinating communication   Thank you for the opportunity to participate in the care of Ryan [name] Please call our office at [phone] if we can be of additional assistance.  Note: Portions of this note were generated with Lobbyist. Dictation errors may occur despite best attempts at proofreading.  Teodoro Spray, NP

## 2021-04-19 ENCOUNTER — Other Ambulatory Visit: Payer: Self-pay

## 2021-04-19 ENCOUNTER — Non-Acute Institutional Stay: Payer: Medicare Other | Admitting: Hospice

## 2021-04-19 DIAGNOSIS — F01C0 Vascular dementia, severe, without behavioral disturbance, psychotic disturbance, mood disturbance, and anxiety: Secondary | ICD-10-CM

## 2021-04-19 DIAGNOSIS — Z515 Encounter for palliative care: Secondary | ICD-10-CM

## 2021-04-19 DIAGNOSIS — R269 Unspecified abnormalities of gait and mobility: Secondary | ICD-10-CM

## 2021-04-19 DIAGNOSIS — F419 Anxiety disorder, unspecified: Secondary | ICD-10-CM

## 2021-04-19 DIAGNOSIS — R600 Localized edema: Secondary | ICD-10-CM

## 2021-04-19 NOTE — Progress Notes (Signed)
Therapist, nutritional Palliative Care Consult Note Telephone: [phone]  Fax: [phone]  PATIENT NAME: BRANDOM KERWIN DOB: 1934/09/21 MRN: [id number]  PRIMARY CARE PROVIDER:   [name], MD [name], MD [address] [place],  [place] [address]  REFERRING PROVIDER: [name], MD [name], MD [address] [place],  [place] [address]  RESPONSIBLE PARTY:  [name] is [facility] Spouse:Rachel Ahart: [phone] Contact Information     Name Relation Home Work [place], [name] Legal Guardian   [phone]   [name] Daughter   [phone]   [name] Daughter   [phone]   [name] Daughter   [phone]       Visit is to build trust and highlight Palliative Medicine as specialized medical care for people living with serious illness, aimed at facilitating better quality of life through symptoms relief, assisting with advance care planning and complex medical decision making. This is a follow up visit.  RECOMMENDATIONS/PLAN:   Advance Care Planning/Code Status:Patient is a Do Not Resuscitate  Goals of Care: Goals of care include to maximize quality of life and symptom management. Discussion today with clarification of goals of care. She leaned towards comfort care; she will discuss with family and meet with NP in a month to fill out the MOST form  Visit consisted of counseling and education dealing with the complex and emotionally intense issues of symptom management and palliative care in the setting of serious and potentially life-threatening illness. Palliative care team will continue to support patient, patient's family, and medical team.  Symptom management/Plan:  Dementia: Memory loss/confusion is progressive.  FAST 6D, incontinent of bowel and bladder. Safety precautions in place. Followed by Neurologist Edema in BLE: Patient is followed by a Cardiologist.  Continue Lasix as  ordered. Anxiety: Managed with Lorazepam.  Continue with compression hose elevation of extremities. Constipation: Continue Metamucil and Miralax as ordered Gait disturbance: Falls precautions.  Rolling walker and transport chair on hand.  Balance of rest and performance activity Follow up Palliative Care visit: Palliative care will continue to follow for complex medical decision making, advance care planning, and clarification of goals. Return 6 weeks or prn.Encouraged to call provider sooner with any concerns.   CHIEF COMPLAINT: Palliative follow up   HISTORY OF PRESENT ILLNESS:  NIVEK POWLEY is a 87 y.o. year old male  with multiple medical conditions including Alzheimer dementia which is worsening with ongoing decline in functional status.  History of edema in BLE,   gait disturbance, HTN, Afib, Hyperlipidemia.  In common area, interactive, denies pain/discomfort, no cough or shortness of breath. FLACC 0. History obtained from review of EMR, discussion with primary team, family and/or patient. Records reviewed and summarized above. All 10 point systems reviewed and are negative except as documented in history of present illness above  Review and summarization of Epic records shows history from other than patient.   Palliative Care was asked to follow this patient o help address complex decision making in the context of advance care planning and goals of care clarification. I reviewed, as needed, available labs, patient records, imaging, studies and related documents from the EMR.    PHYSICAL EXAM  Constitutional: NAD General: Well groomed, cooperative EYES: anicteric sclera, lids intact, no discharge ENMT: Moist mucous membrane CV: S1 S2, RRR, trace edema to BLE Pulmonary: LCTA, no increased work of breathing, no cough, Abdomen: active BS + 4 quadrants, soft and non tender, no ascites  GU: no suprapubic tenderness MSK: weakness,  limited ROM, ambulatory with rolling walker, standby  assist Skin: warm and dry, no rashes or wounds on visible skin Neuro:  weakness, otherwise non focal, forgetful, memory loss Psych: non-anxious affect Hem/lymph/immuno: no widespread bruising  PERTINENT MEDICATIONS:  Outpatient Encounter Medications as of 04/19/2021  Medication Sig   apixaban (ELIQUIS) 5 MG TABS tablet Take 1 tablet (5 mg total) by mouth 2 (two) times daily.   atorvastatin (LIPITOR) 40 MG tablet Take 40 mg by mouth daily.   furosemide (LASIX) 20 MG tablet Take 20 mg by mouth daily.   LORazepam (ATIVAN) 0.5 MG tablet Take 0.25 mg by mouth 2 (two) times daily as needed for anxiety.   metoprolol tartrate (LOPRESSOR) 25 MG tablet Take 1 tablet (25 mg total) by mouth 2 (two) times daily.   Multiple Vitamin (MULTIVITAMIN) tablet Take 1 tablet by mouth daily with breakfast.   Nystatin (GERHARDT'S BUTT CREAM) CREA Apply 1 application topically daily as needed for irritation.   pantoprazole (PROTONIX) 40 MG tablet Take 1 tablet (40 mg total) by mouth daily.   polyethylene glycol (MIRALAX / GLYCOLAX) 17 g packet Take 17 g by mouth daily as needed (for constipation- mix into 4 to 8 ounces of fluid). (Patient not taking: Reported on 03/18/2021)   potassium chloride (KLOR-CON) 20 MEQ tablet Take 1 tablet (20 mEq total) by mouth daily.   Probiotic Product (PROBIOTIC ADVANCED PO) Take 500 mg by mouth daily in the afternoon.   Psyllium (METAMUCIL) 48.57 % POWD See admin instructions. daily   tamsulosin (FLOMAX) 0.4 MG CAPS capsule Take 0.4 mg by mouth in the morning.   vitamin B-12 (CYANOCOBALAMIN) 100 MCG tablet Take 100 mcg by mouth daily.   No facility-administered encounter medications on file as of 04/19/2021.    HOSPICE ELIGIBILITY/DIAGNOSIS: TBD  PAST MEDICAL HISTORY:  Past Medical History:  Diagnosis Date   Alzheimer disease (HCC)    Benign prostatic hyperplasia 01/30/2020   Chronic anticoagulation    On eliquis   Dementia (HCC)    Gait disorder    GI bleed     Hypercholesterolemia    Hypertension    Mixed hyperlipidemia 01/30/2020   Paroxysmal atrial fibrillation (HCC)    TIA (transient ischemic attack) 01/2021      ALLERGIES: No Known Allergies    I spent 60 minutes providing this consultation; this includes time spent with patient/family, chart review and documentation. More than 50% of the time in this consultation was spent on counseling and coordinating communication   Thank you for the opportunity to participate in the care of MYSHAWN CHIRIBOGA Please call our office at [phone] if we can be of additional assistance.  Note: Portions of this note were generated with Scientist, clinical (histocompatibility and immunogenetics). Dictation errors may occur despite best attempts at proofreading.  Rosaura Carpenter, NP

## 2021-06-28 ENCOUNTER — Other Ambulatory Visit: Payer: Medicare Other | Admitting: Hospice

## 2021-06-28 DIAGNOSIS — Z515 Encounter for palliative care: Secondary | ICD-10-CM

## 2021-06-28 DIAGNOSIS — F01C0 Vascular dementia, severe, without behavioral disturbance, psychotic disturbance, mood disturbance, and anxiety: Secondary | ICD-10-CM

## 2021-06-28 DIAGNOSIS — A6000 Herpesviral infection of urogenital system, unspecified: Secondary | ICD-10-CM

## 2021-06-28 DIAGNOSIS — F419 Anxiety disorder, unspecified: Secondary | ICD-10-CM

## 2021-06-28 DIAGNOSIS — R600 Localized edema: Secondary | ICD-10-CM

## 2021-06-28 NOTE — Progress Notes (Signed)
? ? ?Manufacturing engineer ?Community Palliative Care Consult Note ?Telephone: [phone]  ?Fax: [phone] ? ?PATIENT NAME: Ryan [name] ?DOB: 07-05-1934 ?MRN: [id number] ? ?PRIMARY CARE PROVIDER:   Lajean Manes, MD ?Lajean Manes, MD ?[address] ?[address] ?[place],  [place] [address] ? ?REFERRING PROVIDER: Lajean Manes, MD ?Lajean Manes, MD ?[address] ?[address] ?[place],  [place] [address] ? ?RESPONSIBLE PARTY:  ?Ryan [name] is HCPOA ?Spouse:Ryan [name]: [id number] ?Contact Information   ? ? Name Relation Home Work Mobile  ? Ryan [name], Ryan [name]   [phone]  ? Ryan [name] Daughter   [phone]  ? Ryan [name] Daughter   [phone]  ? Ryan [name] Daughter   [phone]  ? ?  ? ? ?Visit is to build trust and highlight Palliative Medicine as specialized medical care for people living with serious illness, aimed at facilitating better quality of life through symptoms relief, assisting with advance care planning and complex medical decision making. This is a follow up visit/meeting with legal guardian accompanied by his wife - Ryan [name]. ? ?RECOMMENDATIONS/PLAN:  ? ?Advance Care Planning/Code Status:Patient is a Do Not Resuscitate ? ?Goals of Care: Goals of care include to maximize quality of life and symptom management. ?Extensive discussion today with legal guardian for clarification of goals of care. ?Comfort is the goal. MOST selections today include antibiotics as indicated, IV fluid for a defined trial period, no feeding tube, comfort measures.  ? ?Visit consisted of counseling and education dealing with the complex and emotionally intense issues of symptom management and palliative care in the setting of serious and potentially life-threatening illness. Palliative care team will continue to support patient, patient's family, and medical team. ? ?Symptom management/Plan:  ?Genital herpes: New, recurrent.  Continue Valacyclovir as ordered and to completion.  ?Dementia:  Memory loss/confusion is worsening.  Impoverished thoughts, difficulty finding words.  FAST 6D, incontinent of bowel and bladder. Safety precautions in place.  Optimize ongoing supportive care.  Patient is followed by Neurologist ?Edema in BLE: Stable.  Continue Lasix as ordered.Continue with compression hose elevation of extremities.  Follow-up with cardiologist as planned. ?Anxiety: Stable.  Managed with Lorazepam.   ?Constipation: Continue Metamucil and Miralax as ordered ?Gait disturbance: Falls precautions.  Uses mostly transport wheelchair, assist to stand, nonambulatory.  Balance of rest and performance activity ?Follow up Palliative Care visit: Palliative care will continue to follow for complex medical decision making, advance care planning, and clarification of goals. Return 6 weeks or prn.Encouraged to call provider sooner with any concerns. ?  ?CHIEF COMPLAINT: Palliative follow up ?  ?HISTORY OF PRESENT ILLNESS:  Ryan [name] is a 87 y.o. year old male  with multiple medical conditions including Alzheimer dementia which is worsening with impoverished thoughts, difficulty finding words and ongoing decline in functional status.  History of edema in BLE,   gait disturbance, HTN, Afib, Hyperlipidemia. Small red bumps/blisters in the thigh/groin; he denies pain/discomfort. Family reports history of genital herpes, per Staff Ryan [name]. History obtained from review of EMR, discussion with primary team, family and/or patient. Records reviewed and summarized above. All 10 point systems reviewed and are negative except as documented in history of present illness above ? ?Review and summarization of Epic records shows history from other than patient.  ? ?Palliative Care was asked to follow this patient o help address complex decision making in the context of advance care planning and goals of care clarification. I reviewed, as needed, available labs, patient records, imaging, studies and related documents from  the  EMR.   ? ?PHYSICAL EXAM  ?Constitutional: NAD, FLACC 0 ?General: Well groomed, cooperative ?EYES: anicteric sclera, lids intact, no discharge ?ENMT: Moist mucous membrane ?CV: S1 S2, RRR, no edema to BLE ?Pulmonary: LCTA, no increased work of breathing, no cough, ?Abdomen: active BS + 4 quadrants, soft and non tender, no ascites ?GU: no suprapubic tenderness, small red bumps/blister in thigh/groin area.  ?MSK: weakness,  limited ROM ?Skin: warm and dry ?Neuro:  weakness, otherwise non focal, forgetful, memory loss ?Psych: non-anxious affect ?Hem/lymph/immuno: no widespread bruising ? ?PERTINENT MEDICATIONS:  ?Outpatient Encounter Medications as of 06/28/2021  ?Medication Sig  ? apixaban (ELIQUIS) 5 MG TABS tablet Take 1 tablet (5 mg total) by mouth 2 (two) times daily.  ? atorvastatin (LIPITOR) 40 MG tablet Take 40 mg by mouth daily.  ? furosemide (LASIX) 20 MG tablet Take 20 mg by mouth daily.  ? LORazepam (ATIVAN) 0.5 MG tablet Take 0.25 mg by mouth 2 (two) times daily as needed for anxiety.  ? metoprolol tartrate (LOPRESSOR) 25 MG tablet Take 1 tablet (25 mg total) by mouth 2 (two) times daily.  ? Multiple Vitamin (MULTIVITAMIN) tablet Take 1 tablet by mouth daily with breakfast.  ? Nystatin (GERHARDT'S BUTT CREAM) CREA Apply 1 application topically daily as needed for irritation.  ? pantoprazole (PROTONIX) 40 MG tablet Take 1 tablet (40 mg total) by mouth daily.  ? polyethylene glycol (MIRALAX / GLYCOLAX) 17 g packet Take 17 g by mouth daily as needed (for constipation- mix into 4 to 8 ounces of fluid). (Patient not taking: Reported on 03/18/2021)  ? potassium chloride (KLOR-CON) 20 MEQ tablet Take 1 tablet (20 mEq total) by mouth daily.  ? Probiotic Product (PROBIOTIC ADVANCED PO) Take 500 mg by mouth daily in the afternoon.  ? Psyllium (METAMUCIL) 48.57 % POWD See admin instructions. daily  ? tamsulosin (FLOMAX) 0.4 MG CAPS capsule Take 0.4 mg by mouth in the morning.  ? vitamin B-12 (CYANOCOBALAMIN) 100 MCG  tablet Take 100 mcg by mouth daily.  ? ?No facility-administered encounter medications on file as of 06/28/2021.  ? ? ?HOSPICE ELIGIBILITY/DIAGNOSIS: TBD ? ?PAST MEDICAL HISTORY:  ?Past Medical History:  ?Diagnosis Date  ? Alzheimer disease ([place])   ? Benign prostatic hyperplasia 01/30/2020  ? Chronic anticoagulation   ? On eliquis  ? Dementia ([place])   ? Gait disorder   ? GI bleed   ? Hypercholesterolemia   ? Hypertension   ? Mixed hyperlipidemia 01/30/2020  ? Paroxysmal atrial fibrillation ([facility])   ? TIA (transient ischemic attack) 01/2021  ?  ? ? ?ALLERGIES: No Known Allergies   ? ?I spent 60 minutes providing this consultation; this includes time spent with patient/family, chart review and documentation. More than 50% of the time in this consultation was spent on counseling and coordinating communication  ? ?Thank you for the opportunity to participate in the care of Ryan [name] Please call our office at [phone] if we can be of additional assistance. ? ?Note: Portions of this note were generated with Lobbyist. Dictation errors may occur despite best attempts at proofreading. ? ?Teodoro Spray, NP ? ?  ?

## 2021-07-11 ENCOUNTER — Other Ambulatory Visit: Payer: Medicare Other | Admitting: Hospice

## 2021-07-11 DIAGNOSIS — R269 Unspecified abnormalities of gait and mobility: Secondary | ICD-10-CM

## 2021-07-11 DIAGNOSIS — F01C0 Vascular dementia, severe, without behavioral disturbance, psychotic disturbance, mood disturbance, and anxiety: Secondary | ICD-10-CM

## 2021-07-11 DIAGNOSIS — F419 Anxiety disorder, unspecified: Secondary | ICD-10-CM

## 2021-07-11 DIAGNOSIS — R600 Localized edema: Secondary | ICD-10-CM

## 2021-07-11 DIAGNOSIS — Z515 Encounter for palliative care: Secondary | ICD-10-CM

## 2021-07-11 DIAGNOSIS — A6000 Herpesviral infection of urogenital system, unspecified: Secondary | ICD-10-CM

## 2021-07-11 NOTE — Progress Notes (Signed)
? ? ?Civil engineer, contracting ?Community Palliative Care Consult Note ?Telephone: [phone]  ?Fax: [phone] ? ?PATIENT NAME: Ryan [name] ?DOB: [birth year] ?MRN: [id number] ? ?PRIMARY CARE PROVIDER:   [name], MD ?[name], MD ?[address] ?[address] ?[place],  [place] [address] ? ?REFERRING PROVIDER: [name], MD ?[name], MD ?[address] ?[address] ?[place],  [place] [address] ? ?RESPONSIBLE PARTY:  ?[name] is HCPOA ?Spouse:Rachel Jedlicka: [phone] ?Contact Information   ? ? Name Relation Home Work Mobile  ? Catalina Antigua Legal Guardian   [phone]  ? [name] Daughter   [phone]  ? [name] Daughter   [phone]  ? [name] Daughter   [phone]  ? ?  ? ?Visit is to build trust and highlight Palliative Medicine as specialized medical care for people living with serious illness, aimed at facilitating better quality of life through symptoms relief, assisting with advance care planning and complex medical decision making.  NP called Debbie and updated her on visit. ? ?RECOMMENDATIONS/PLAN:  ? ?Advance Care Planning/Code Status:Patient is a Do Not Resuscitate ? ?Goals of Care: Goals of care include to maximize quality of life and symptom management. ?Previous extensive discussion with legal guardian for clarification of goals of care. ?Comfort is the goal. MOST selections today include antibiotics as indicated, IV fluid for a defined trial period, no feeding tube, comfort measures. Today, NP gave original signed MOST form to Memory Care care coordinator [facility].   ? ?Visit consisted of counseling and education dealing with the complex and emotionally intense issues of symptom management and palliative care in the setting of serious and potentially life-threatening illness. Palliative care team will continue to support patient, patient's family, and medical team. ? ?Symptom management/Plan:  ?Genital herpes: Lesion improved, vesicles resolved.  Recently completed Valacyclovir 07/05/21.  ?Dementia: Severe. Memory loss/confusion is worsening in line with dementia disease trajectory.  Impoverished thoughts, difficulty finding words.  FAST 6D, incontinent of bowel and bladder. Safety precautions in place.  Optimize ongoing supportive care.  Patient is followed by Neurologist ?Edema in BLE: Stable.  Continue Lasix as ordered.Continue with compression hose elevation of extremities.  Follow-up with cardiologist as planned.  ?Anxiety: Stable.  Managed with Lorazepam.   ?Constipation: Continue Metamucil and Miralax as ordered ?Gait disturbance: No recent fall.  Falls precautions.  Uses mostly transport wheelchair, assist to stand, nonambulatory.  Balance of rest and performance activity ?Follow up Palliative Care visit: Palliative care will continue to follow for complex medical decision making, advance care planning, and clarification of goals. Return 6 weeks or prn.Encouraged to call provider sooner with any concerns. ?  ?CHIEF COMPLAINT: Palliative follow up ?  ?HISTORY OF PRESENT ILLNESS:  Ryan [name] is a 87 y.o. year old male  with multiple morbidities requiring close monitoring/management with high risk of complications and morbidity: Advanced dementia, edema in BLE,   gait disturbance, HTN, Afib, Hyperlipidemia. Small red bumps/blisters in the thigh/groin improved; he denies pain/discomfort. Family reports history of genital herpes. History obtained from review of EMR, discussion with primary team, family and/or patient. Records reviewed and summarized above. All 10 point systems reviewed and are negative except as documented in history of present illness above ? ?Review and summarization of Epic records shows history from other than patient.  ? ?Palliative Care was asked to follow this patient o help address complex decision making in the context of advance care planning and goals of care clarification. I reviewed, as needed, available labs, patient  records,  imaging, studies and related documents from the EMR.   ? ?PHYSICAL EXAM  ?Constitutional: NAD, FLACC 0 ?General: Well groomed, cooperative ?EYES: anicteric sclera, lids intact, no discharge ?ENMT: Moist mucous membrane ?CV: S1 S2, RRR, no edema to BLE ?Pulmonary: LCTA, no increased work of breathing, no cough, ?Abdomen: active BS + 4 quadrants, soft and non tender, no ascites ?GU: no suprapubic tenderness, small red bumps/blister in thigh/groin area resolved.  ?MSK: weakness,  limited ROM ?Skin: warm and dry ?Neuro:  weakness, otherwise non focal, forgetful, memory loss ?Psych: non-anxious affect ?Hem/lymph/immuno: no widespread bruising ? ?PERTINENT MEDICATIONS:  ?Outpatient Encounter Medications as of 07/11/2021  ?Medication Sig  ? apixaban (ELIQUIS) 5 MG TABS tablet Take 1 tablet (5 mg total) by mouth 2 (two) times daily.  ? atorvastatin (LIPITOR) 40 MG tablet Take 40 mg by mouth daily.  ? furosemide (LASIX) 20 MG tablet Take 20 mg by mouth daily.  ? LORazepam (ATIVAN) 0.5 MG tablet Take 0.25 mg by mouth 2 (two) times daily as needed for anxiety.  ? metoprolol tartrate (LOPRESSOR) 25 MG tablet Take 1 tablet (25 mg total) by mouth 2 (two) times daily.  ? Multiple Vitamin (MULTIVITAMIN) tablet Take 1 tablet by mouth daily with breakfast.  ? Nystatin (GERHARDT'S BUTT CREAM) CREA Apply 1 application topically daily as needed for irritation.  ? pantoprazole (PROTONIX) 40 MG tablet Take 1 tablet (40 mg total) by mouth daily.  ? polyethylene glycol (MIRALAX / GLYCOLAX) 17 g packet Take 17 g by mouth daily as needed (for constipation- mix into 4 to 8 ounces of fluid). (Patient not taking: Reported on 03/18/2021)  ? potassium chloride (KLOR-CON) 20 MEQ tablet Take 1 tablet (20 mEq total) by mouth daily.  ? Probiotic Product (PROBIOTIC ADVANCED PO) Take 500 mg by mouth daily in the afternoon.  ? Psyllium (METAMUCIL) 48.57 % POWD See admin instructions. daily  ? tamsulosin (FLOMAX) 0.4 MG CAPS capsule Take 0.4 mg by  mouth in the morning.  ? vitamin B-12 (CYANOCOBALAMIN) 100 MCG tablet Take 100 mcg by mouth daily.  ? ?No facility-administered encounter medications on file as of 07/11/2021.  ? ? ?HOSPICE ELIGIBILITY/DIAGNOSIS: TBD ? ?PAST MEDICAL HISTORY:  ?Past Medical History:  ?Diagnosis Date  ? Alzheimer disease ([place])   ? Benign prostatic hyperplasia 01/30/2020  ? Chronic anticoagulation   ? On eliquis  ? Dementia ([place])   ? Gait disorder   ? GI bleed   ? Hypercholesterolemia   ? Hypertension   ? Mixed hyperlipidemia 01/30/2020  ? Paroxysmal atrial fibrillation ([facility])   ? TIA (transient ischemic attack) 01/2021  ?  ? ? ?ALLERGIES: No Known Allergies   ? ?I spent 60 minutes providing this consultation; this includes time spent with patient/family, chart review and documentation. More than 50% of the time in this consultation was spent on counseling and coordinating communication  ? ?Thank you for the opportunity to participate in the care of MARTON BRAATZ Please call our office at [phone] if we can be of additional assistance. ? ?Note: Portions of this note were generated with Lobbyist. Dictation errors may occur despite best attempts at proofreading. ? ?Teodoro Spray, NP ? ?  ?

## 2021-07-18 ENCOUNTER — Other Ambulatory Visit: Payer: Self-pay

## 2021-07-18 ENCOUNTER — Emergency Department (HOSPITAL_COMMUNITY): Payer: Medicare Other

## 2021-07-18 ENCOUNTER — Encounter (HOSPITAL_COMMUNITY): Payer: Self-pay | Admitting: Internal Medicine

## 2021-07-18 ENCOUNTER — Inpatient Hospital Stay (HOSPITAL_COMMUNITY)
Admission: EM | Admit: 2021-07-18 | Discharge: 2021-07-21 | DRG: 689 | Disposition: A | Discharge status: Skilled Nursing Facility | Payer: Medicare Other | Attending: Internal Medicine | Admitting: Internal Medicine

## 2021-07-18 DIAGNOSIS — I10 Essential (primary) hypertension: Secondary | ICD-10-CM | POA: Diagnosis present

## 2021-07-18 DIAGNOSIS — Z7901 Long term (current) use of anticoagulants: Secondary | ICD-10-CM | POA: Diagnosis not present

## 2021-07-18 DIAGNOSIS — Z8673 Personal history of transient ischemic attack (TIA), and cerebral infarction without residual deficits: Secondary | ICD-10-CM

## 2021-07-18 DIAGNOSIS — B9620 Unspecified Escherichia coli [E. coli] as the cause of diseases classified elsewhere: Secondary | ICD-10-CM | POA: Diagnosis present

## 2021-07-18 DIAGNOSIS — N400 Enlarged prostate without lower urinary tract symptoms: Secondary | ICD-10-CM | POA: Diagnosis present

## 2021-07-18 DIAGNOSIS — G9341 Metabolic encephalopathy: Secondary | ICD-10-CM | POA: Diagnosis present

## 2021-07-18 DIAGNOSIS — N3000 Acute cystitis without hematuria: Secondary | ICD-10-CM | POA: Diagnosis not present

## 2021-07-18 DIAGNOSIS — N179 Acute kidney failure, unspecified: Secondary | ICD-10-CM | POA: Diagnosis present

## 2021-07-18 DIAGNOSIS — Z66 Do not resuscitate: Secondary | ICD-10-CM | POA: Diagnosis present

## 2021-07-18 DIAGNOSIS — N390 Urinary tract infection, site not specified: Secondary | ICD-10-CM | POA: Diagnosis present

## 2021-07-18 DIAGNOSIS — Z79899 Other long term (current) drug therapy: Secondary | ICD-10-CM

## 2021-07-18 DIAGNOSIS — G309 Alzheimer's disease, unspecified: Secondary | ICD-10-CM | POA: Diagnosis present

## 2021-07-18 DIAGNOSIS — Z1612 Extended spectrum beta lactamase (ESBL) resistance: Secondary | ICD-10-CM | POA: Diagnosis present

## 2021-07-18 DIAGNOSIS — E860 Dehydration: Secondary | ICD-10-CM | POA: Diagnosis present

## 2021-07-18 DIAGNOSIS — K219 Gastro-esophageal reflux disease without esophagitis: Secondary | ICD-10-CM | POA: Diagnosis present

## 2021-07-18 DIAGNOSIS — I480 Paroxysmal atrial fibrillation: Secondary | ICD-10-CM | POA: Diagnosis not present

## 2021-07-18 DIAGNOSIS — Z7189 Other specified counseling: Secondary | ICD-10-CM

## 2021-07-18 DIAGNOSIS — E782 Mixed hyperlipidemia: Secondary | ICD-10-CM | POA: Diagnosis present

## 2021-07-18 DIAGNOSIS — R4189 Other symptoms and signs involving cognitive functions and awareness: Secondary | ICD-10-CM | POA: Diagnosis present

## 2021-07-18 DIAGNOSIS — Z993 Dependence on wheelchair: Secondary | ICD-10-CM

## 2021-07-18 DIAGNOSIS — R531 Weakness: Secondary | ICD-10-CM

## 2021-07-18 DIAGNOSIS — I482 Chronic atrial fibrillation, unspecified: Secondary | ICD-10-CM | POA: Diagnosis present

## 2021-07-18 DIAGNOSIS — D649 Anemia, unspecified: Secondary | ICD-10-CM | POA: Diagnosis present

## 2021-07-18 DIAGNOSIS — R7989 Other specified abnormal findings of blood chemistry: Secondary | ICD-10-CM

## 2021-07-18 DIAGNOSIS — F0390 Unspecified dementia without behavioral disturbance: Secondary | ICD-10-CM

## 2021-07-18 DIAGNOSIS — K5900 Constipation, unspecified: Secondary | ICD-10-CM | POA: Diagnosis present

## 2021-07-18 DIAGNOSIS — F0280 Dementia in other diseases classified elsewhere without behavioral disturbance: Secondary | ICD-10-CM | POA: Diagnosis present

## 2021-07-18 DIAGNOSIS — Z95828 Presence of other vascular implants and grafts: Secondary | ICD-10-CM

## 2021-07-18 DIAGNOSIS — B9689 Other specified bacterial agents as the cause of diseases classified elsewhere: Secondary | ICD-10-CM | POA: Diagnosis present

## 2021-07-18 DIAGNOSIS — Z86711 Personal history of pulmonary embolism: Secondary | ICD-10-CM | POA: Diagnosis not present

## 2021-07-18 DIAGNOSIS — R627 Adult failure to thrive: Secondary | ICD-10-CM

## 2021-07-18 LAB — COMPREHENSIVE METABOLIC PANEL
ALT: 18 U/L (ref 0–44)
AST: 16 U/L (ref 15–41)
Albumin: 2.9 g/dL — ABNORMAL LOW (ref 3.5–5.0)
Alkaline Phosphatase: 65 U/L (ref 38–126)
Anion gap: 8 (ref 5–15)
BUN: 25 mg/dL — ABNORMAL HIGH (ref 8–23)
CO2: 24 mmol/L (ref 22–32)
Calcium: 8.4 mg/dL — ABNORMAL LOW (ref 8.9–10.3)
Chloride: 108 mmol/L (ref 98–111)
Creatinine, Ser: 1.35 mg/dL — ABNORMAL HIGH (ref 0.61–1.24)
GFR, Estimated: 52 mL/min — ABNORMAL LOW (ref >60)
Glucose, Bld: 119 mg/dL — ABNORMAL HIGH (ref 70–99)
Potassium: 3.6 mmol/L (ref 3.5–5.1)
Sodium: 140 mmol/L (ref 135–145)
Total Bilirubin: 0.5 mg/dL (ref 0.3–1.2)
Total Protein: 6.2 g/dL — ABNORMAL LOW (ref 6.5–8.1)

## 2021-07-18 LAB — CBC
HCT: 38 % — ABNORMAL LOW (ref 39.0–52.0)
Hemoglobin: 11.9 g/dL — ABNORMAL LOW (ref 13.0–17.0)
MCH: 24.3 pg — ABNORMAL LOW (ref 26.0–34.0)
MCHC: 31.3 g/dL (ref 30.0–36.0)
MCV: 77.7 fL — ABNORMAL LOW (ref 80.0–100.0)
Platelets: 170 10*3/uL (ref 150–400)
RBC: 4.89 MIL/uL (ref 4.22–5.81)
RDW: 18.9 % — ABNORMAL HIGH (ref 11.5–15.5)
WBC: 6.5 10*3/uL (ref 4.0–10.5)
nRBC: 0 % (ref 0.0–0.2)

## 2021-07-18 LAB — URINALYSIS, ROUTINE W REFLEX MICROSCOPIC
Bilirubin Urine: NEGATIVE
Glucose, UA: NEGATIVE mg/dL
Ketones, ur: NEGATIVE mg/dL
Nitrite: NEGATIVE
Protein, ur: 100 mg/dL — AB
RBC / HPF: >50 RBC/hpf — ABNORMAL HIGH (ref 0–5)
Specific Gravity, Urine: 1.011 (ref 1.005–1.030)
WBC, UA: >50 WBC/hpf — ABNORMAL HIGH (ref 0–5)
pH: 5 (ref 5.0–8.0)

## 2021-07-18 LAB — RAPID URINE DRUG SCREEN, HOSP PERFORMED
Amphetamines: NOT DETECTED
Barbiturates: NOT DETECTED
Benzodiazepines: NOT DETECTED
Cocaine: NOT DETECTED
Opiates: NOT DETECTED
Tetrahydrocannabinol: NOT DETECTED

## 2021-07-18 LAB — PROTIME-INR
INR: 1.6 — ABNORMAL HIGH (ref 0.8–1.2)
Prothrombin Time: 18.5 seconds — ABNORMAL HIGH (ref 11.4–15.2)

## 2021-07-18 LAB — DIFFERENTIAL
Abs Immature Granulocytes: 0.19 10*3/uL — ABNORMAL HIGH (ref 0.00–0.07)
Basophils Absolute: 0 10*3/uL (ref 0.0–0.1)
Basophils Relative: 1 %
Eosinophils Absolute: 0.1 10*3/uL (ref 0.0–0.5)
Eosinophils Relative: 1 %
Immature Granulocytes: 3 %
Lymphocytes Relative: 14 %
Lymphs Abs: 0.9 10*3/uL (ref 0.7–4.0)
Monocytes Absolute: 0.9 10*3/uL (ref 0.1–1.0)
Monocytes Relative: 14 %
Neutro Abs: 4.4 10*3/uL (ref 1.7–7.7)
Neutrophils Relative %: 67 %

## 2021-07-18 LAB — APTT: aPTT: 35 seconds (ref 24–36)

## 2021-07-18 LAB — ETHANOL: Alcohol, Ethyl (B): <10 mg/dL (ref <10)

## 2021-07-18 IMAGING — MR MR HEAD W/O CM
14 of 15 series · 44 of 48 positions shown · non-contrast
Comparison: [DATE]

CLINICAL DATA: Acute neurologic deficit

EXAM:
MRI HEAD WITHOUT CONTRAST
TECHNIQUE: Multiplanar, multiecho pulse sequences of the brain and surrounding
structures were obtained without intravenous contrast.

[Series 5: DWI · axial · 3.0mm · 0.88mm/px · z∈[-71,+72]mm · 7 of 100 slices shown (1 of 6)]
[im 1/100]
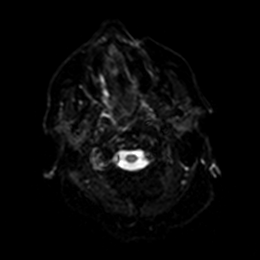
[im 17/100]
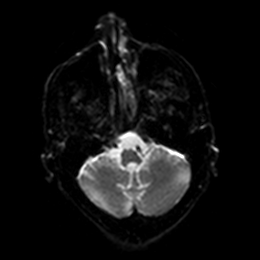
[im 34/100]
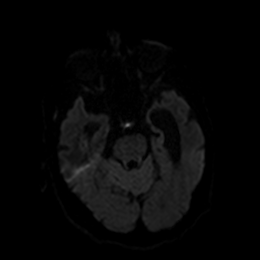
[im 50/100]
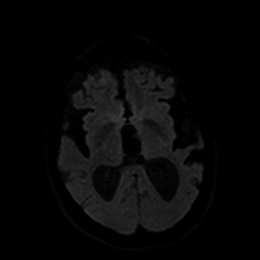
[im 67/100]
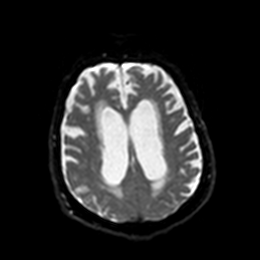
[im 83/100]
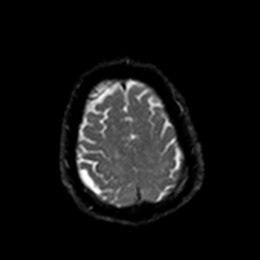
[im 100/100]
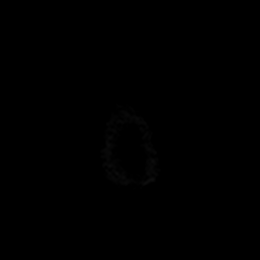

[Series 6: DWI · axial · 3.0mm · 0.88mm/px · z∈[-71,+72]mm · 3 of 49 slices shown (2 of 6)]
[im 1/49]
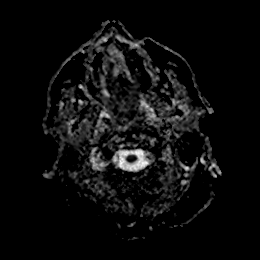
[im 25/49]
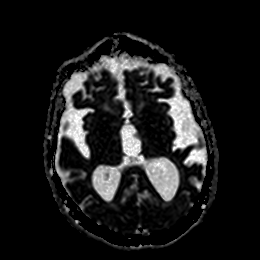
[im 49/49]
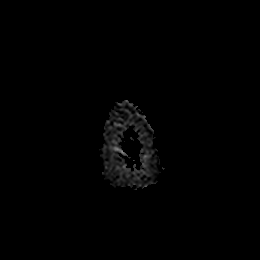

[Series 7: DWI · coronal · 4.0mm · 0.88mm/px · 4 of 66 slices shown (3 of 6)]
[im 1/66]
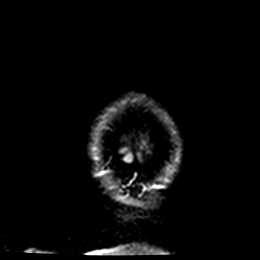
[im 22/66]
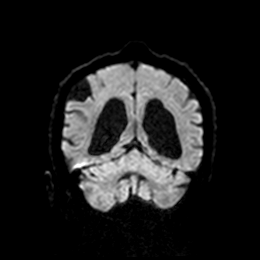
[im 44/66]
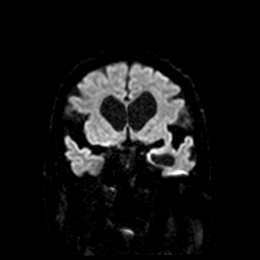
[im 66/66]
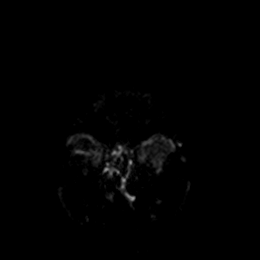

[Series 8: DWI · coronal · 4.0mm · 0.88mm/px · 2 of 33 slices shown (4 of 6)]
[im 1/33]
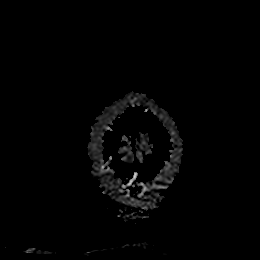
[im 33/33]
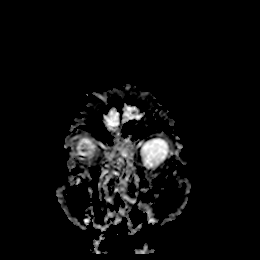

[Series 9: FLAIR · axial · 5.0mm · 0.45mm/px · z∈[-74,+78]mm · 2 of 27 slices shown]
[im 1/27]
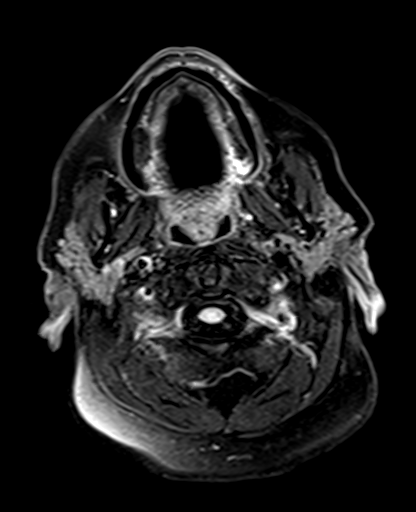
[im 27/27]
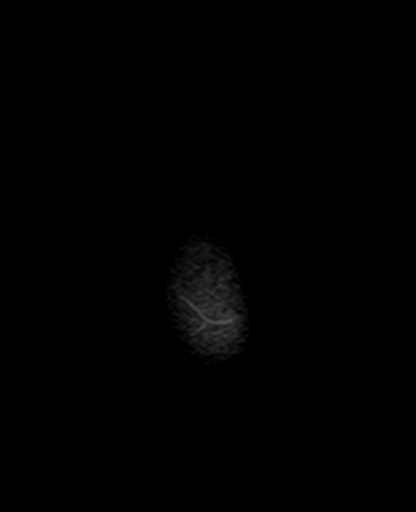

[Series 10: mag_images · axial · 3.0mm · 0.90mm/px · z∈[-72,+77]mm · 4 of 52 slices shown]
[im 1/52]
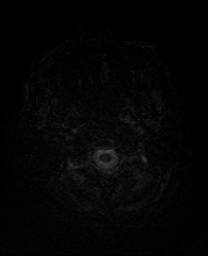
[im 18/52]
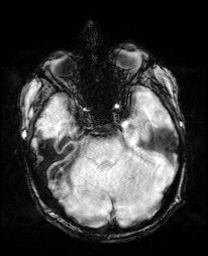
[im 35/52]
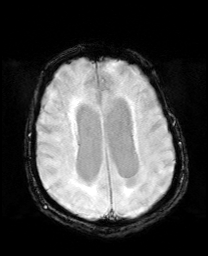
[im 52/52]
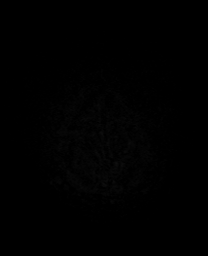

[Series 11: pha_images · axial · 3.0mm · 0.90mm/px · z∈[-72,+74]mm · 3 of 51 slices shown]
[im 1/51]
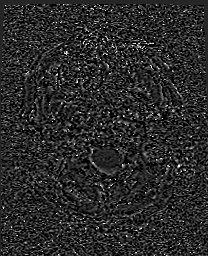
[im 26/51]
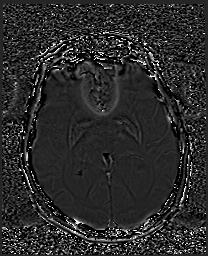
[im 51/51]
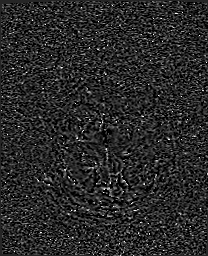

[Series 12: swi_images · axial · 3.0mm · 0.90mm/px · z∈[-72,+77]mm · 4 of 52 slices shown]
[im 1/52]
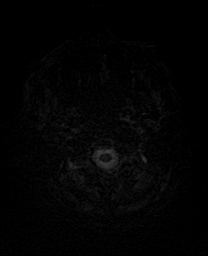
[im 18/52]
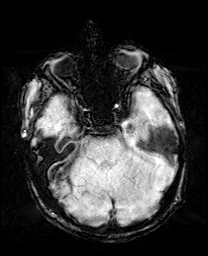
[im 35/52]
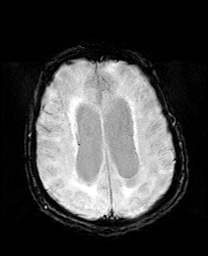
[im 52/52]
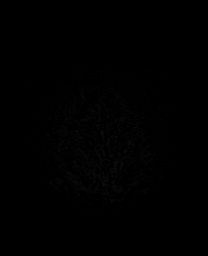

[Series 13: mip_images(sw) · axial · 24.0mm · 0.90mm/px · z∈[-62,+66]mm · 3 of 45 slices shown]
[im 1/45]
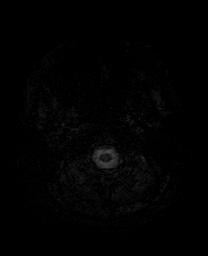
[im 23/45]
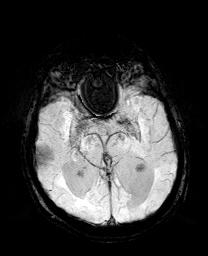
[im 45/45]
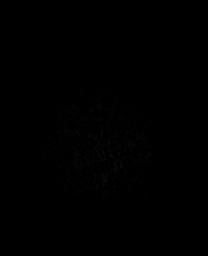

[Series 14: T2 · axial · 5.0mm · 0.72mm/px · z∈[-72,+74]mm · 2 of 26 slices shown (1 of 2)]
[im 1/26]
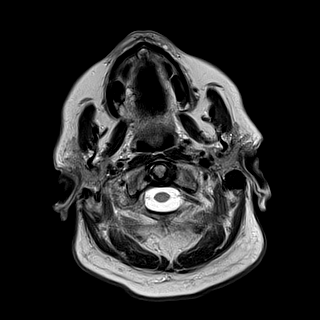
[im 26/26]
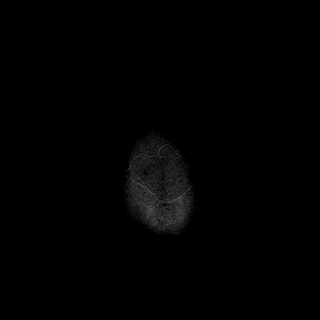

[Series 15: T1 · sagittal · 5.0mm · 0.75mm/px · 2 of 25 slices shown]
[im 1/25]
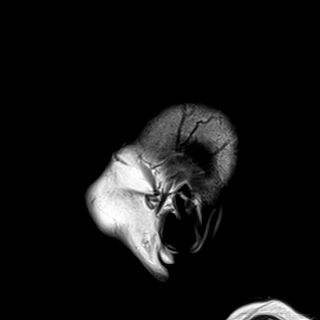
[im 25/25]
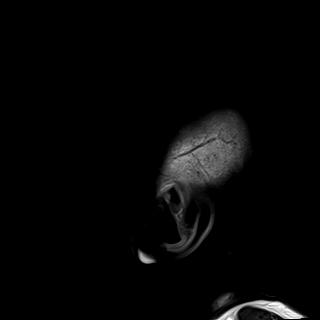

[Series 17: T2 · coronal · 5.0mm · 0.34mm/px · 2 of 29 slices shown (2 of 2)]
[im 1/29]
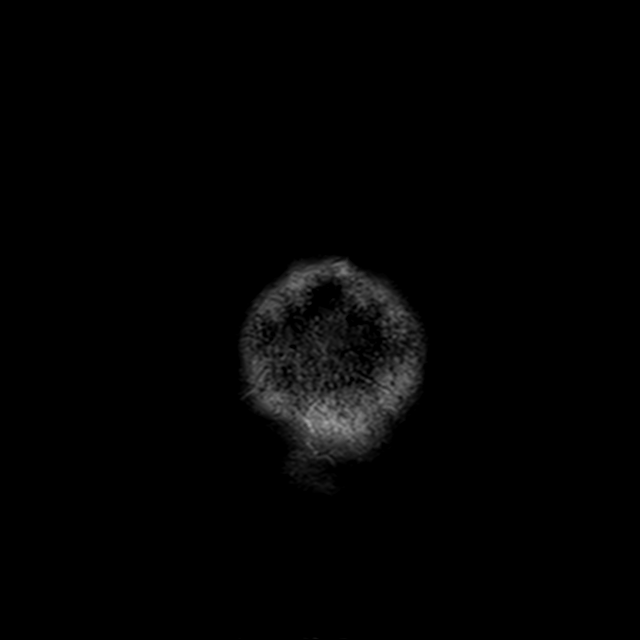
[im 29/29]
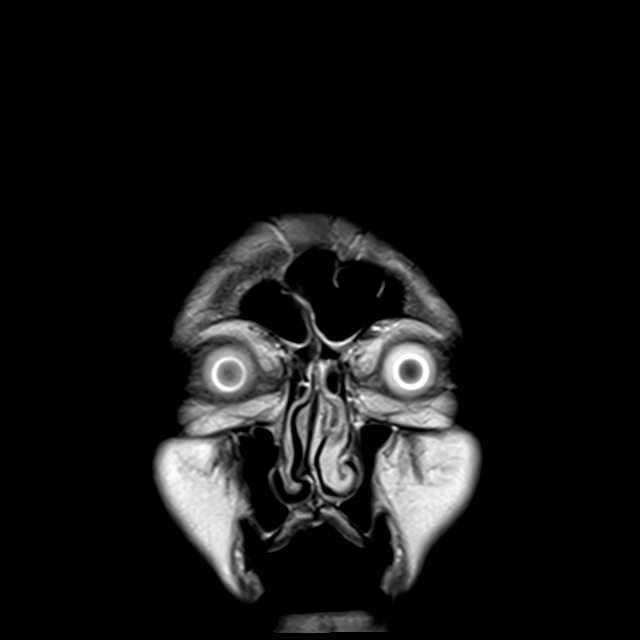

[Series 18: DWI · coronal · 4.0mm · 0.88mm/px · 4 of 66 slices shown (5 of 6)]
[im 1/66]
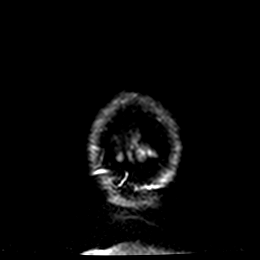
[im 22/66]
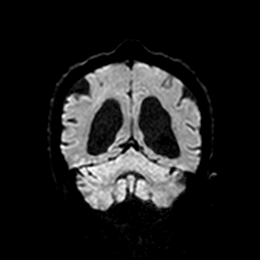
[im 44/66]
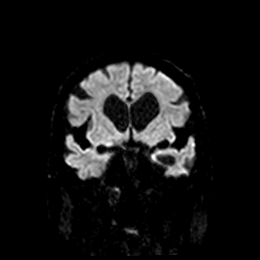
[im 66/66]
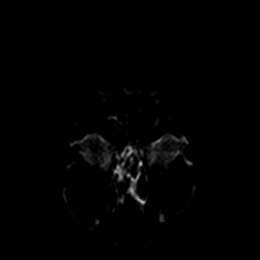

[Series 19: DWI · coronal · 4.0mm · 0.88mm/px · 2 of 33 slices shown (6 of 6)]
[im 1/33]
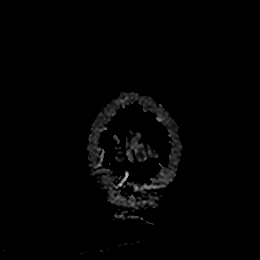
[im 33/33]
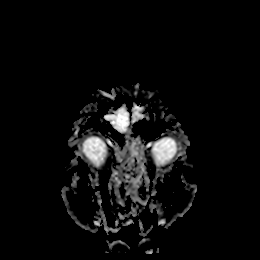

[44 of 48 positions shown; findings below may reference images not displayed]

FINDINGS: Brain: No acute infarct, mass effect or extra-axial collection. No
acute or chronic hemorrhage. There is multifocal hyperintense
T2-weighted signal within the white matter. There is advanced
atrophy. The midline structures are normal.

Vascular: Major flow voids are preserved.

Skull and upper cervical spine: Normal calvarium and skull base.
Visualized upper cervical spine and soft tissues are normal.

Sinuses/Orbits:No paranasal sinus fluid levels or advanced mucosal
thickening. No mastoid or middle ear effusion. Normal orbits.
IMPRESSION: 1. No acute intracranial abnormality.
2. Advanced atrophy and findings of chronic small vessel ischemia.

## 2021-07-18 IMAGING — CT CT HEAD W/O CM
5 of 6 series · 17 of 47 positions shown, 18 images · non-contrast
Comparison: CT head dated [DATE].

CLINICAL DATA: Weakness, loss of appetite, acute neurologic
deficit.



[Series 3: head without · axial · non-contrast · 0.48mm/px · z∈[-98,-44]mm · 2 of 34 slices shown, 3 images (1 of 2)]
[im 12/34  brain]
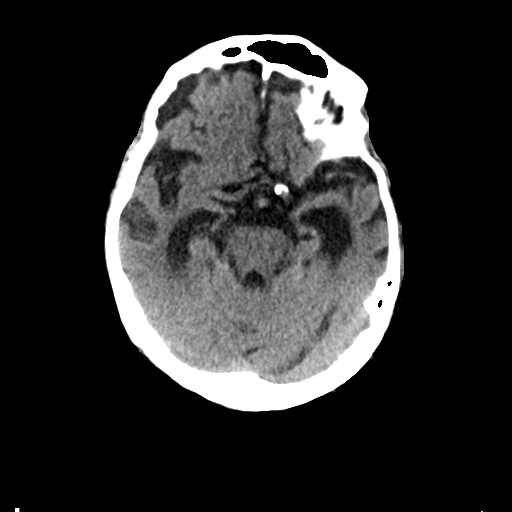
[im 12/34  bone]
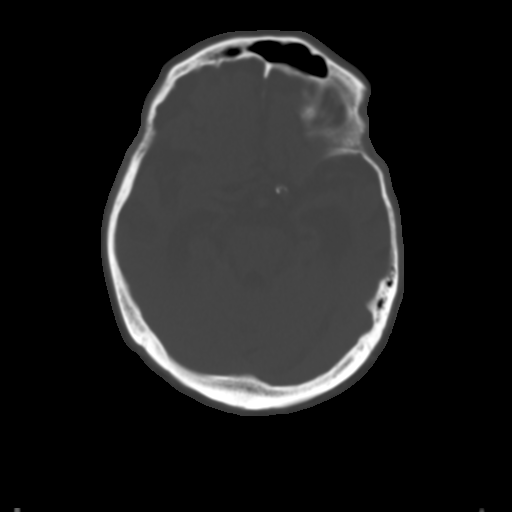
[im 23/34  brain]
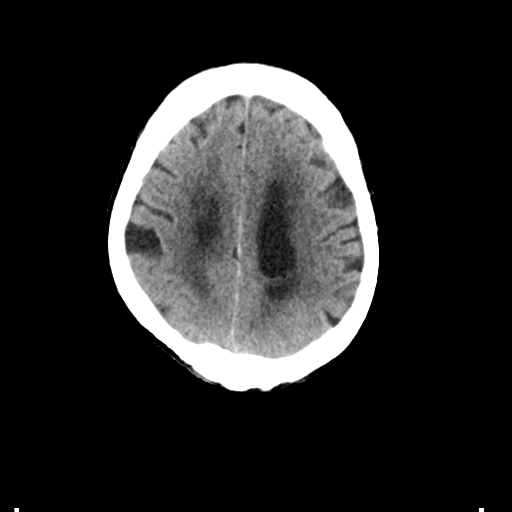

[Series 4: head bone · axial · 0.48mm/px · z∈[-138,-20]mm · 7 of 85 slices shown]
[im 9/85  bone]
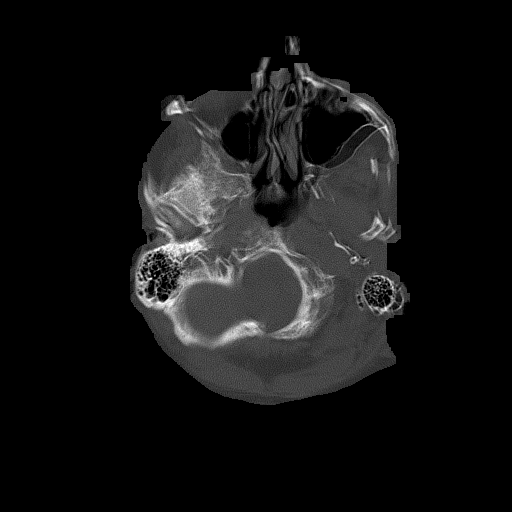
[im 17/85  bone]
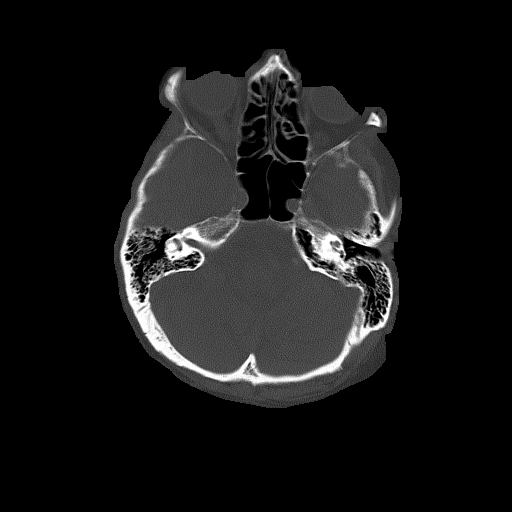
[im 26/85  bone]
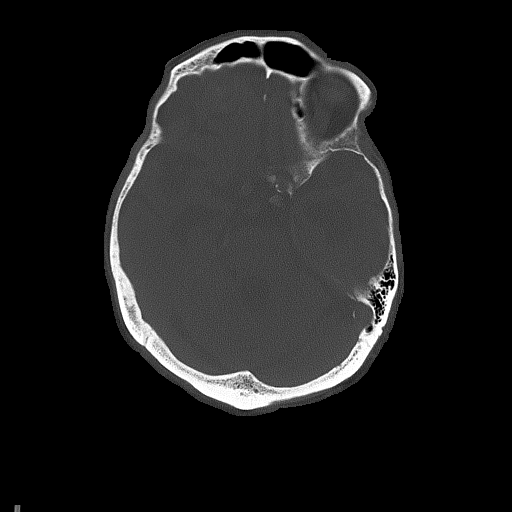
[im 34/85  bone]
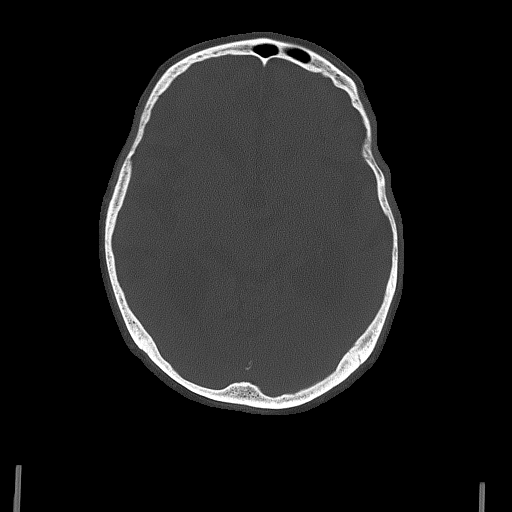
[im 51/85  bone]
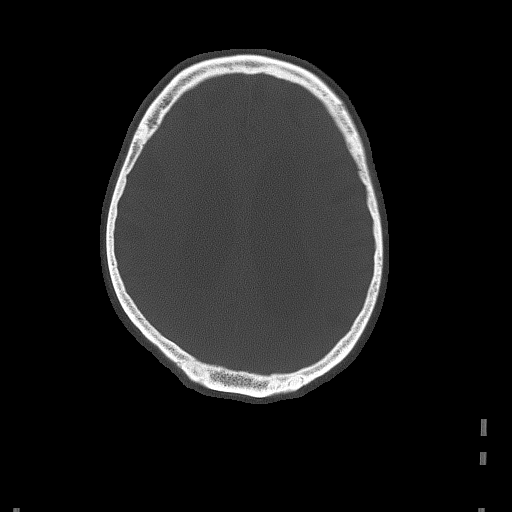
[im 59/85  bone]
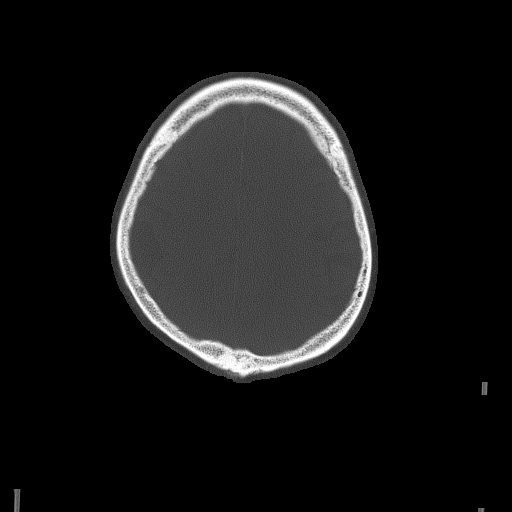
[im 68/85  bone]
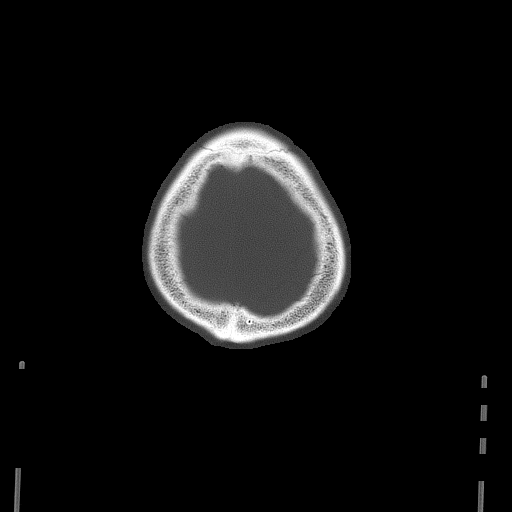

[Series 5: head without · axial · non-contrast · 0.48mm/px · z∈[-98,-44]mm · 2 of 34 slices shown (2 of 2)]
[im 12/34  brain]
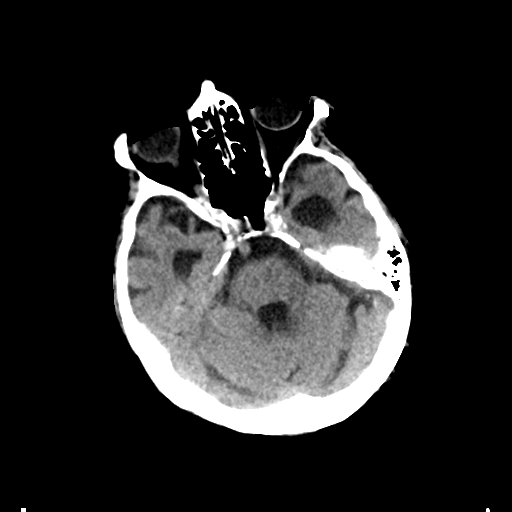
[im 23/34  brain]
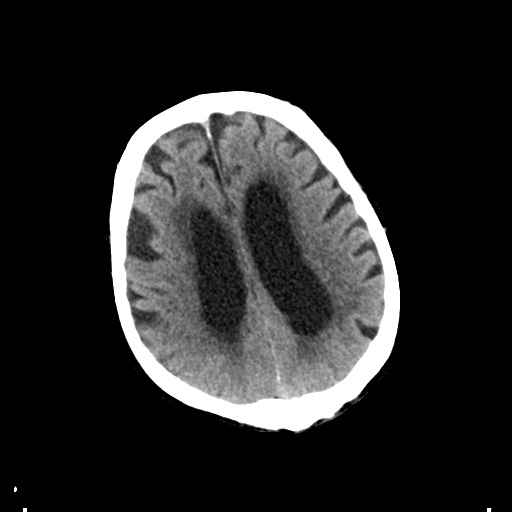

[Series 7: head without cor · coronal · non-contrast · 0.35mm/px · 3 of 72 slices shown]
[im 24/72  brain]
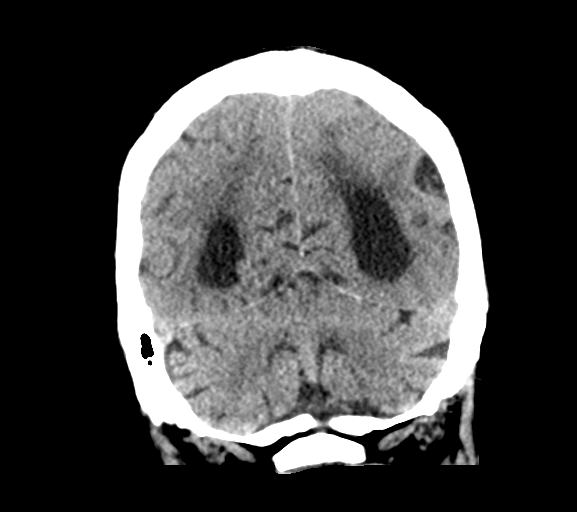
[im 32/72  brain]
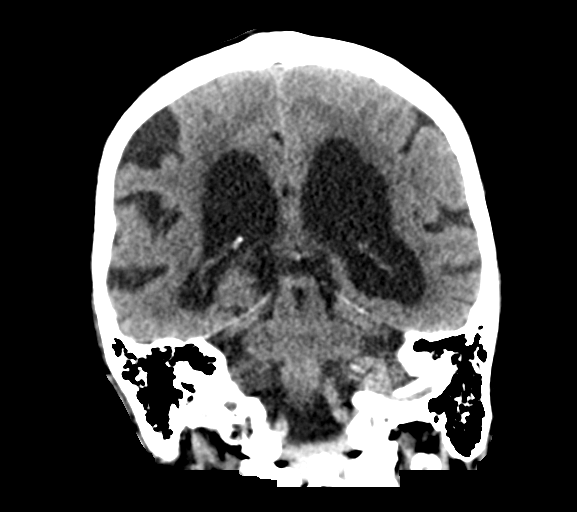
[im 40/72  brain]
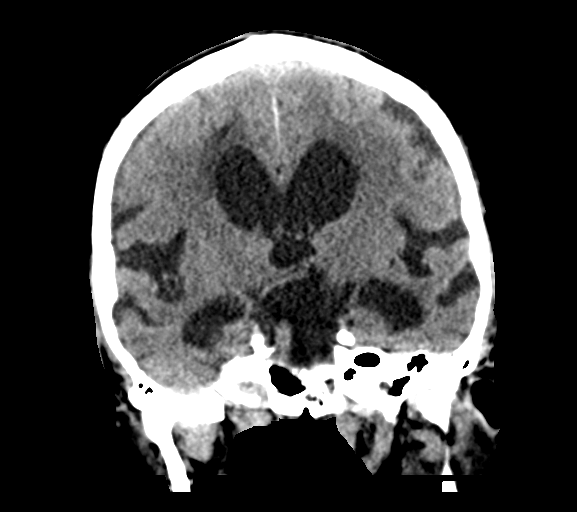

[Series 8: head without sag · sagittal · non-contrast · 0.33mm/px · 3 of 65 slices shown]
[im 22/65  brain]
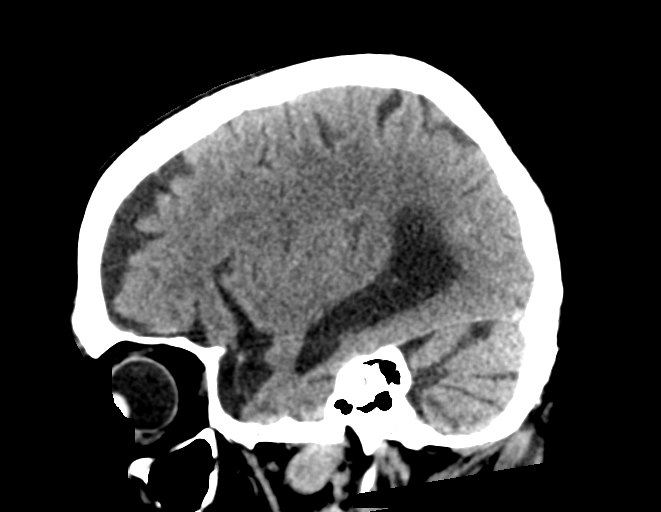
[im 33/65  brain]
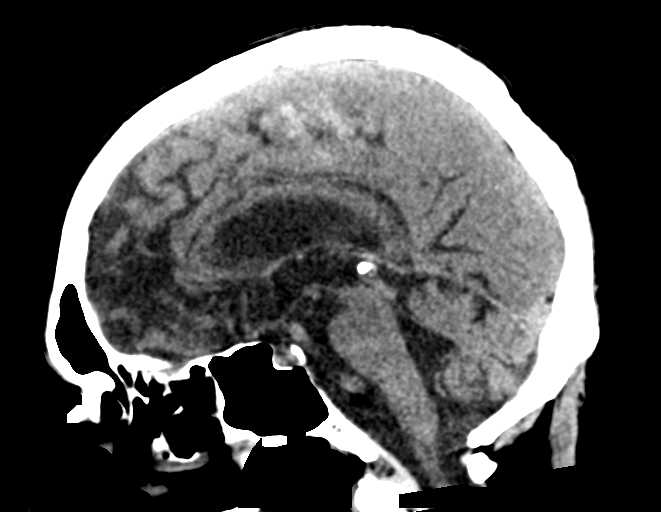
[im 43/65  brain]
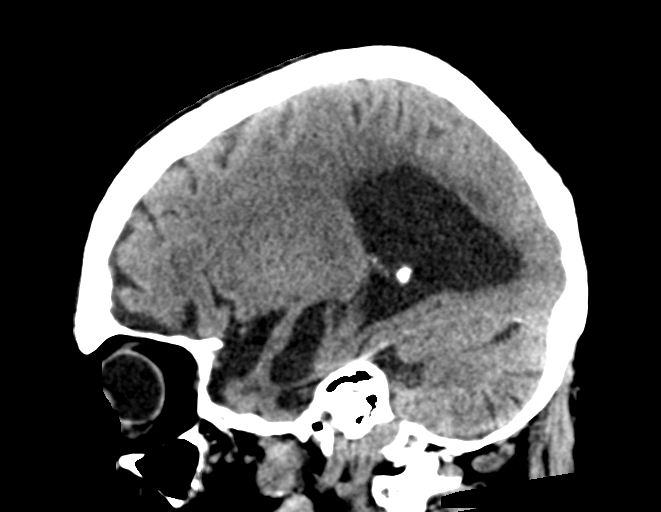

[17 of 47 positions shown; findings below may reference images not displayed]

FINDINGS: Brain: No evidence of acute infarction, hemorrhage, hydrocephalus,
extra-axial collection or mass lesion/mass effect. There is moderate
cerebral volume loss with associated ex vacuo dilatation.
Periventricular white matter hypoattenuation likely represents
chronic small vessel ischemic disease.

Vascular: There are vascular calcifications in the carotid siphons.

Skull: Normal. Negative for fracture or focal lesion.

Sinuses/Orbits: No acute finding.

Other: None.
IMPRESSION: No acute intracranial process.

## 2021-07-18 IMAGING — DX DG CHEST 1V PORT
1 series · 1 of 1 positions shown · non-contrast
Comparison: Prior chest radiographs [DATE] and earlier.

CLINICAL DATA: Provided history: Weakness.

EXAM:
PORTABLE CHEST 1 VIEW

[chest]
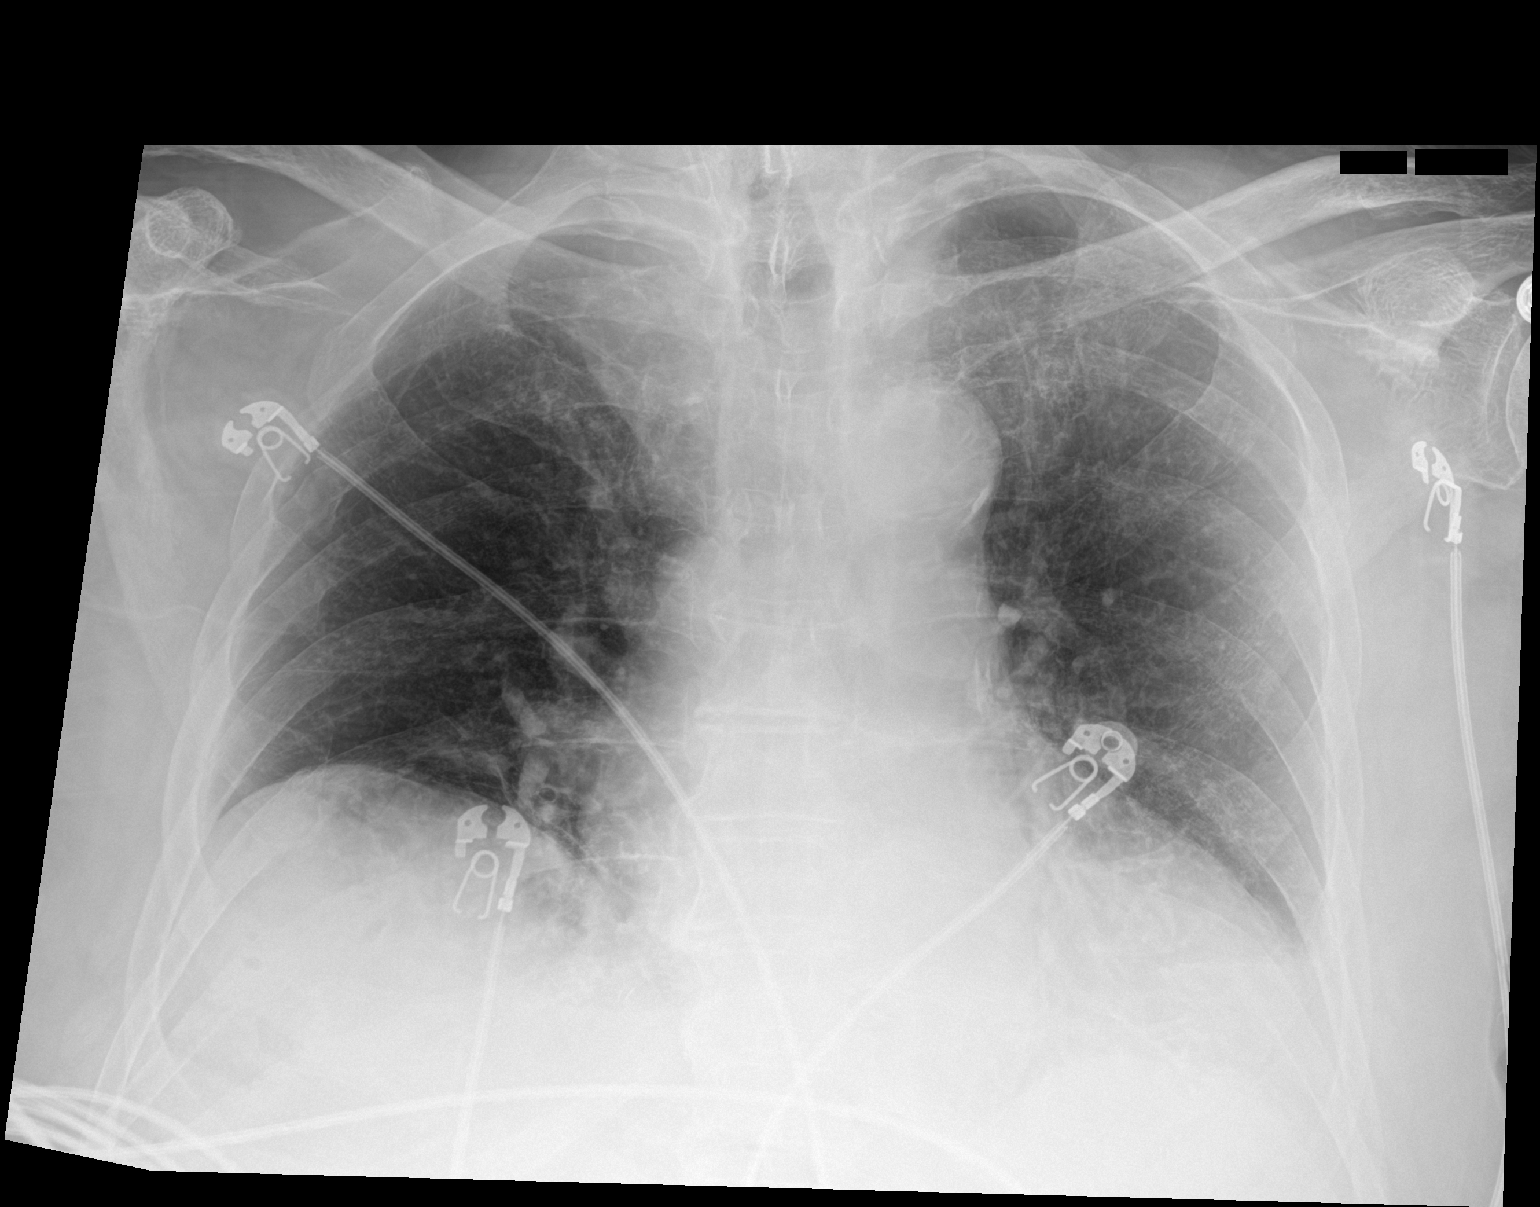

[1 of 1 positions shown; findings below may reference images not displayed]

FINDINGS: Shallow inspiration radiograph. Cardiomegaly. Aortic
atherosclerosis. Minimal atelectasis within the left lung base. No
evidence of pleural effusion or pneumothorax. No acute bony
abnormality identified.
IMPRESSION: Shallow inspiration radiograph.

Minimal atelectasis within the left lung base.

No appreciable airspace consolidation or pulmonary edema.

Aortic Atherosclerosis ([PK]-[PK]).

## 2021-07-18 MED ORDER — ACETAMINOPHEN 650 MG RE SUPP: 650.0000 mg | Freq: Four times a day (QID) | RECTAL | Status: DC | PRN

## 2021-07-18 MED ORDER — ACETAMINOPHEN 325 MG PO TABS: 650.0000 mg | ORAL_TABLET | Freq: Four times a day (QID) | ORAL | Status: DC | PRN

## 2021-07-18 MED ORDER — HYDRALAZINE HCL 20 MG/ML IJ SOLN: 10.0000 mg | Freq: Four times a day (QID) | INTRAMUSCULAR | Status: DC | PRN

## 2021-07-18 MED ORDER — METOPROLOL TARTRATE 25 MG PO TABS
25.0000 mg | ORAL_TABLET | Freq: Two times a day (BID) | ORAL | Status: DC
  Administered 2021-07-19 – 2021-07-21 (×5): 25 mg via ORAL
  Filled 2021-07-18 (×5): qty 1

## 2021-07-18 MED ORDER — LORAZEPAM 0.5 MG PO TABS: 0.2500 mg | ORAL_TABLET | Freq: Two times a day (BID) | ORAL | Status: DC | PRN

## 2021-07-18 MED ORDER — LORAZEPAM 2 MG/ML IJ SOLN
0.5000 mg | Freq: Once | INTRAMUSCULAR | Status: AC | PRN
  Administered 2021-07-18: 0.5 mg via INTRAVENOUS
  Filled 2021-07-18: qty 1

## 2021-07-18 MED ORDER — ENOXAPARIN SODIUM 40 MG/0.4ML IJ SOSY: 40.0000 mg | PREFILLED_SYRINGE | INTRAMUSCULAR | Status: DC

## 2021-07-18 MED ORDER — PANTOPRAZOLE SODIUM 40 MG PO TBEC
40.0000 mg | DELAYED_RELEASE_TABLET | Freq: Every day | ORAL | Status: DC
  Administered 2021-07-19 – 2021-07-21 (×3): 40 mg via ORAL
  Filled 2021-07-18 (×3): qty 1

## 2021-07-18 MED ORDER — SODIUM CHLORIDE 0.9 % IV SOLN
100.0000 mL/h | INTRAVENOUS | Status: DC
  Administered 2021-07-18: 100 mL/h via INTRAVENOUS

## 2021-07-18 MED ORDER — ATORVASTATIN CALCIUM 40 MG PO TABS
40.0000 mg | ORAL_TABLET | Freq: Every day | ORAL | Status: DC
  Administered 2021-07-19 – 2021-07-21 (×3): 40 mg via ORAL
  Filled 2021-07-18 (×3): qty 1

## 2021-07-18 MED ORDER — SODIUM CHLORIDE 0.9 % IV SOLN
100.0000 mL/h | INTRAVENOUS | Status: DC
  Administered 2021-07-19: 100 mL/h via INTRAVENOUS

## 2021-07-18 MED ORDER — FUROSEMIDE 20 MG PO TABS: 20.0000 mg | ORAL_TABLET | Freq: Every day | ORAL | Status: DC

## 2021-07-18 MED ORDER — APIXABAN 5 MG PO TABS
5.0000 mg | ORAL_TABLET | Freq: Two times a day (BID) | ORAL | Status: DC
  Administered 2021-07-19 – 2021-07-21 (×5): 5 mg via ORAL
  Filled 2021-07-18 (×5): qty 1

## 2021-07-18 MED ORDER — PSYLLIUM 95 % PO PACK
1.0000 | PACK | Freq: Every day | ORAL | Status: DC
  Administered 2021-07-19: 1 via ORAL
  Filled 2021-07-18: qty 1

## 2021-07-18 MED ORDER — SODIUM CHLORIDE 0.9 % IV SOLN
1.0000 g | INTRAVENOUS | Status: DC
  Administered 2021-07-19 – 2021-07-20 (×2): 1 g via INTRAVENOUS
  Filled 2021-07-18 (×2): qty 10

## 2021-07-18 MED ORDER — SODIUM CHLORIDE 0.9 % IV SOLN
1.0000 g | Freq: Once | INTRAVENOUS | Status: AC
  Administered 2021-07-18: 1 g via INTRAVENOUS
  Filled 2021-07-18: qty 10

## 2021-07-18 MED ORDER — SODIUM CHLORIDE 0.9 % IV BOLUS
500.0000 mL | Freq: Once | INTRAVENOUS | Status: AC
  Administered 2021-07-18: 500 mL via INTRAVENOUS

## 2021-07-18 MED ORDER — POLYETHYLENE GLYCOL 3350 17 G PO PACK: 17.0000 g | PACK | Freq: Every day | ORAL | Status: DC | PRN

## 2021-07-18 MED ORDER — FERROUS SULFATE 325 (65 FE) MG PO TABS: 325.0000 mg | ORAL_TABLET | ORAL | Status: DC

## 2021-07-18 MED ORDER — TAMSULOSIN HCL 0.4 MG PO CAPS
0.4000 mg | ORAL_CAPSULE | Freq: Every morning | ORAL | Status: DC
  Administered 2021-07-19 – 2021-07-21 (×3): 0.4 mg via ORAL
  Filled 2021-07-18 (×3): qty 1

## 2021-07-18 NOTE — ED Notes (Signed)
Phlebotomy only able to obtain 1 set of blood cultures.

## 2021-07-18 NOTE — ED Notes (Signed)
Pt remains in MRI 

## 2021-07-18 NOTE — Assessment & Plan Note (Addendum)
.   Continuing home regimen of lipid lowering therapy.  

## 2021-07-18 NOTE — Assessment & Plan Note (Addendum)
Continuing home regimen of daily PPI therapy.  

## 2021-07-18 NOTE — ED Triage Notes (Signed)
BIB [facility] from [facility] after facility called to report increase loss of appetite and weakness x 1 week. Pt able to stand and pivot but in wheelchair at baseline.   HX: Dementia, strokes

## 2021-07-18 NOTE — ED Notes (Signed)
Patient pericare done and brief changed. Patient placed on a condom cath with drainage bag at bedside.

## 2021-07-18 NOTE — Assessment & Plan Note (Deleted)
Initially had some tenderness on exam.  X-ray abdomen shows evidence of constipation.  No tenderness at the time of my evaluation. Treat UTI and constipation.  Follow-up on urine culture.

## 2021-07-18 NOTE — Assessment & Plan Note (Addendum)
   Continue home regimen of Flomax 

## 2021-07-18 NOTE — Assessment & Plan Note (Addendum)
Resume patients home regimen of oral antihypertensives Changing Lasix from daily to 3 times a week regimen due to concerns with hydration.

## 2021-07-18 NOTE — ED Provider Notes (Signed)
Pt signed out by Dr Tomi Bamberger that has weakness, decreased po intake, increased confusion - to check labs and mri.  Mri neg for acute cva.   UA c/w uti. Mild elev bun/cr compared to baseline.   Given weakness, altered mental status, uti - will admit.  Hospitalists consulted for admission.     Lajean Saver, MD 07/18/21 2033

## 2021-07-18 NOTE — Assessment & Plan Note (Addendum)
Longstanding known history of dementia and cognitive deficit At risk for delirium.

## 2021-07-18 NOTE — Assessment & Plan Note (Addendum)
Currently rate controlled. Continue home regimen of anticoagulation Continue home regimen of rate controlling agent

## 2021-07-18 NOTE — Assessment & Plan Note (Addendum)
Patient presented with worsening malaise, weakness, lethargy and poor oral intake concerning for acute metabolic encephalopathy. Suspect due to UTI.  Urine culture growing E. coli.  ESBL Metabolic work-up is unremarkable. Patient actually received lorazepam prior to coming to the hospital for evaluation.  Although UDS unremarkable. Mild AKI as well. Currently renal function better.  Mentation back to baseline.  Still remains fatigued.

## 2021-07-18 NOTE — ED Provider Notes (Signed)
[facility] EMERGENCY DEPARTMENT Provider Note   CSN: [id number] Arrival date & time: 07/18/21  1332     History  Chief Complaint  Patient presents with   Weakness    Ryan [name] is a 87 y.o. male.   Weakness Associated symptoms: no fever    Patient has history of hypertension, Alzheimer's, paroxysmal A-fib, hyperlipidemia, BPH, dementia, TIA who presents to the ED for decreased appetite and weakness ongoing for the last week.  Patient generally needs to use a wheelchair but is able to stand and pivot.  Patient's daughter states he has appeared more weak over the last week or so.  He seems to be slumping to the right.  She thinks he is had a mild facial droop on the right side.  Patient himself denies any complaints.  He states he feels fine.  He is denying any headache chest pain.  Home Medications Prior to Admission medications   Medication Sig Start Date End Date Taking? Authorizing Provider  apixaban (ELIQUIS) 5 MG TABS tablet Take 1 tablet (5 mg total) by mouth 2 (two) times daily. 01/17/21  Yes Ghimire, Henreitta Leber, MD  atorvastatin (LIPITOR) 40 MG tablet Take 40 mg by mouth daily.   Yes [provider]  Emollient (LUBRIDERM SERIOUSLY SENSITIVE) LOTN Apply 1 application. topically daily.   Yes [provider]  ferrous sulfate 325 (65 FE) MG EC tablet Take 325 mg by mouth 2 (two) times a week.   Yes [provider]  furosemide (LASIX) 20 MG tablet Take 20 mg by mouth daily. 03/06/21  Yes [provider]  LORazepam (ATIVAN) 0.5 MG tablet Take 0.25 mg by mouth 2 (two) times daily as needed for anxiety. 01/03/21  Yes [provider]  metoprolol tartrate (LOPRESSOR) 25 MG tablet Take 1 tablet (25 mg total) by mouth 2 (two) times daily. 01/17/21 07/18/21 Yes Ghimire, Henreitta Leber, MD  Multiple Vitamin (MULTIVITAMIN) tablet Take 1 tablet by mouth daily with breakfast.   Yes [provider]  pantoprazole (PROTONIX) 40  MG tablet Take 1 tablet (40 mg total) by mouth daily. 02/07/21  Yes Willia Craze, NP  polyethylene glycol (MIRALAX / GLYCOLAX) 17 g packet Take 17 g by mouth daily as needed (for constipation- mix into 4 to 8 ounces of fluid). 02/14/20  Yes [provider]  potassium chloride (KLOR-CON) 20 MEQ tablet Take 1 tablet (20 mEq total) by mouth daily. 01/17/21  Yes Ghimire, Henreitta Leber, MD  Probiotic Product (PROBIOTIC ADVANCED PO) Take 500 mg by mouth daily in the afternoon.   Yes [provider]  Psyllium (METAMUCIL) 48.57 % POWD See admin instructions. daily   Yes [provider]  tamsulosin (FLOMAX) 0.4 MG CAPS capsule Take 0.4 mg by mouth in the morning.   Yes [provider]  Nystatin (GERHARDT'S BUTT CREAM) CREA Apply 1 application topically daily as needed for irritation. Patient not taking: Reported on 07/18/2021 01/17/21   Jonetta Osgood, MD      Allergies    Patient has no known allergies.    Review of Systems   Review of Systems  Constitutional:  Negative for fever.  Neurological:  Positive for weakness.   Physical Exam Updated Vital Signs BP (!) 139/91   Pulse 85   Temp 97.7 F (36.5 C) (Axillary)   Resp 19   SpO2 95%  Physical Exam Vitals and nursing note reviewed.  Constitutional:      Appearance: He is well-developed. He is  not diaphoretic.  HENT:     Head: Normocephalic and atraumatic.     Right Ear: External ear normal.     Left Ear: External ear normal.  Eyes:     General: No scleral icterus.       Right eye: No discharge.        Left eye: No discharge.     Conjunctiva/sclera: Conjunctivae normal.  Neck:     Trachea: No tracheal deviation.  Cardiovascular:     Rate and Rhythm: Normal rate and regular rhythm.  Pulmonary:     Effort: Pulmonary effort is normal. No respiratory distress.     Breath sounds: Normal breath sounds. No stridor. No wheezing or rales.  Abdominal:     General: Bowel sounds are normal. There is no  distension.     Palpations: Abdomen is soft.     Tenderness: There is no abdominal tenderness. There is no guarding or rebound.  Musculoskeletal:        General: No tenderness or deformity.     Cervical back: Neck supple.  Skin:    General: Skin is warm and dry.     Findings: No rash.  Neurological:     General: No focal deficit present.     Mental Status: He is alert.     GCS: GCS eye subscore is 4. GCS verbal subscore is 4. GCS motor subscore is 6.     Cranial Nerves: No cranial nerve deficit (?mild right facial droop, extraocular movements intact, no slurred speech) or dysarthria.     Sensory: No sensory deficit.     Motor: Abnormal muscle tone present. No seizure activity.     Coordination: Coordination normal.     Comments: Resists movements including lifting his arms and legs off the bed, increased tone noted throughout, patient does have equal grip strength, is able to move both feet  Psychiatric:        Mood and Affect: Mood normal.    ED Results / Procedures / Treatments   Labs (all labs ordered are listed, but only abnormal results are displayed) Labs Reviewed  PROTIME-INR - Abnormal; Notable for the following components:      Result Value   Prothrombin Time 18.5 (*)    INR 1.6 (*)    All other components within normal limits  CBC - Abnormal; Notable for the following components:   Hemoglobin 11.9 (*)    HCT 38.0 (*)    MCV 77.7 (*)    MCH 24.3 (*)    RDW 18.9 (*)    All other components within normal limits  DIFFERENTIAL - Abnormal; Notable for the following components:   Abs Immature Granulocytes 0.19 (*)    All other components within normal limits  RESP PANEL BY RT-PCR (FLU A&B, COVID) ARPGX2  ETHANOL  APTT  RAPID URINE DRUG SCREEN, HOSP PERFORMED  URINALYSIS, ROUTINE W REFLEX MICROSCOPIC    EKG EKG Interpretation  Date/Time:  Thursday Jul 18 2021 13:47:46 EDT Ventricular Rate:  86 PR Interval:    QRS Duration: 99 QT Interval:  405 QTC  Calculation: 485 R Axis:   45 Text Interpretation: Atrial fibrillation Low voltage, precordial leads Minimal ST depression, anterolateral leads Borderline prolonged QT interval No significant change since last tracing Confirmed by Dorie Rank [id number]) on 07/18/2021 1:52:19 PM  Radiology CT HEAD WO CONTRAST  Result Date: 07/18/2021 CLINICAL DATA:  Weakness, loss of appetite, acute neurologic deficit. EXAM: CT HEAD WITHOUT CONTRAST TECHNIQUE: Contiguous axial images were obtained from  the base of the skull through the vertex without intravenous contrast. RADIATION DOSE REDUCTION: This exam was performed according to the departmental dose-optimization program which includes automated exposure control, adjustment of the mA and/or kV according to patient size and/or use of iterative reconstruction technique. COMPARISON:  CT head dated 02/23/2021. FINDINGS: Brain: No evidence of acute infarction, hemorrhage, hydrocephalus, extra-axial collection or mass lesion/mass effect. There is moderate cerebral volume loss with associated ex vacuo dilatation. Periventricular white matter hypoattenuation likely represents chronic small vessel ischemic disease. Vascular: There are vascular calcifications in the carotid siphons. Skull: Normal. Negative for fracture or focal lesion. Sinuses/Orbits: No acute finding. Other: None. IMPRESSION: No acute intracranial process. Electronically Signed   By: Zerita Boers M.D.   On: 07/18/2021 15:19   DG Chest Portable 1 View  Result Date: 07/18/2021 CLINICAL DATA:  Provided history: Weakness. EXAM: PORTABLE CHEST 1 VIEW COMPARISON:  Prior chest radiographs 01/15/2021 and earlier. FINDINGS: Shallow inspiration radiograph. Cardiomegaly. Aortic atherosclerosis. Minimal atelectasis within the left lung base. No evidence of pleural effusion or pneumothorax. No acute bony abnormality identified. IMPRESSION: Shallow inspiration radiograph. Minimal atelectasis within the left lung base. No  appreciable airspace consolidation or pulmonary edema. Aortic Atherosclerosis (ICD10-I70.0). Electronically Signed   By: Kellie Simmering D.O.   On: 07/18/2021 14:12    Procedures Procedures    Medications Ordered in ED Medications  sodium chloride 0.9 % bolus 500 mL (500 mLs Intravenous New Bag/Given 07/18/21 1421)    Followed by  0.9 %  sodium chloride infusion (100 mL/hr Intravenous New Bag/Given 07/18/21 1421)  LORazepam (ATIVAN) injection 0.5 mg (has no administration in time range)    ED Course/ Medical Decision Making/ A&P Clinical Course as of 07/18/21 1535  Thu Jul 18, 2021  1525 CT HEAD WO CONTRAST Head CT without acute findings [JK]  1525 DG Chest Portable 1 View Chest x-ray without acute findings [JK]  1525 CBC(!) Anemia stable [JK]  1525 CBC(!) [JK]    Clinical Course User Index [JK] Dorie Rank, MD                           Medical Decision Making Problems Addressed: Anemia, unspecified type: chronic illness or injury Dementia, unspecified dementia severity, unspecified dementia type, unspecified whether behavioral, psychotic, or mood disturbance or anxiety ([place]): chronic illness or injury Weakness: acute illness or injury that poses a threat to life or bodily functions  Amount and/or Complexity of Data Reviewed Labs: ordered. Decision-making details documented in ED Course. Radiology: ordered and independent interpretation performed. Decision-making details documented in ED Course.  Risk Prescription drug management.   Patient presented to the ED for evaluation of weakness.  Concerned about the possibility of anemia dehydration, pneumonia, urinary tract infection and stroke.  Initial evaluation does not show any signs of acute abnormalities on head CT.  No pneumonia on chest x-ray.  We will plan on MRI to rule out occult stroke.  Urinalysis still pending.  Care turned over to oncoming md        Final Clinical Impression(s) / ED Diagnoses Final diagnoses:   Weakness  Anemia, unspecified type  Dementia, unspecified dementia severity, unspecified dementia type, unspecified whether behavioral, psychotic, or mood disturbance or anxiety [facility])    Rx / DC Orders ED Discharge Orders     None         Dorie Rank, MD 07/18/21 1535

## 2021-07-18 NOTE — H&P (Addendum)
History and Physical    Patient: Ryan [name] MRN: [id number] DOA: 07/18/2021  Date of Service: the patient was seen and examined on 07/19/2021  Patient coming from: SNF  Chief Complaint:  Chief Complaint  Patient presents with   Weakness    HPI:   87 year old male who is a resident of [facility] with past medical history of advanced dementia, paroxysmal atrial fibrillation now on Eliquis, previous history of pulmonary embolism status post IVC filter due to history of gastrointestinal bleeding, hypertension, hyperlipidemia, benign prostatic hyperplasia, normocytic anemia, gastroesophageal reflux disease who presents to [facility] emergency department with complaints of poor oral intake and increasing confusion according to her facility.  Patient is an extremely poor historian due to advanced dementia.  Majority the history has been obtained via discussions with the emergency department staff and review of the notes.  Patient has been experiencing approximately-1 week history of worsening generalized weakness and poor appetite.  No evidence of fever, diarrhea or localized pain.  Due to concerns for progressively worsening weakness and lethargy EMS was contacted and the patient was brought into [facility] emergency department for evaluation.  Upon evaluation in the emergency department noncontrast CT imaging of the head was found to be unremarkable.  This was followed up with an MRI brain without contrast traditionally revealed no evidence of acute stroke.  Chest x-ray was unremarkable.  Urinalysis however did reveal greater than 50 white blood cells per high-powered field as well as large leukocytes and many bacteria all concerning for urinary tract infection.  Patient was initiated on intravenous ceftriaxone.  The hospitalist group was then called to assess the patient for admission to the hospital.  Review of Systems: Review of Systems  Unable to perform ROS: Dementia     Past Medical History:  Diagnosis Date   Alzheimer disease (HCC)    Benign prostatic hyperplasia 01/30/2020   Chronic anticoagulation    On eliquis   Dementia (HCC)    Gait disorder    GI bleed    Hypercholesterolemia    Hypertension    Mixed hyperlipidemia 01/30/2020   Paroxysmal atrial fibrillation (HCC)    TIA (transient ischemic attack) 01/2021    Past Surgical History:  Procedure Laterality Date   BIOPSY  02/05/2020   Procedure: BIOPSY;  Surgeon: Lemar Lofty., MD;  Location: [facility] ENDOSCOPY;  Service: Gastroenterology;;   CHOLECYSTECTOMY     ESOPHAGOGASTRODUODENOSCOPY (EGD) WITH PROPOFOL N/A 02/05/2020   Procedure: ESOPHAGOGASTRODUODENOSCOPY (EGD) WITH PROPOFOL;  Surgeon: Lemar Lofty., MD;  Location: [facility] ENDOSCOPY;  Service: Gastroenterology;  Laterality: N/A;   FLEXIBLE SIGMOIDOSCOPY N/A 02/05/2020   Procedure: FLEXIBLE SIGMOIDOSCOPY;  Surgeon: Meridee Score Netty Starring., MD;  Location: [facility] ENDOSCOPY;  Service: Gastroenterology;  Laterality: N/A;   SAVORY DILATION N/A 02/05/2020   Procedure: SAVORY DILATION;  Surgeon: Meridee Score Netty Starring., MD;  Location: [facility] ENDOSCOPY;  Service: Gastroenterology;  Laterality: N/A;   TOTAL HIP ARTHROPLASTY      Social History:  reports that he has never smoked. He has never used smokeless tobacco. He reports that he does not currently use alcohol. He reports that he does not use drugs.  No Known Allergies  History reviewed. No pertinent family history.  Prior to Admission medications   Medication Sig Start Date End Date Taking? Authorizing Provider  apixaban (ELIQUIS) 5 MG TABS tablet Take 1 tablet (5 mg total) by mouth 2 (two) times daily. 01/17/21  Yes Ghimire, Werner Lean, MD  atorvastatin (LIPITOR) 40 MG tablet Take  40 mg by mouth daily.   Yes [provider]  Emollient (LUBRIDERM SERIOUSLY SENSITIVE) LOTN Apply 1 application. topically daily.   Yes [provider]  ferrous sulfate 325 (65 FE) MG EC  tablet Take 325 mg by mouth 2 (two) times a week.   Yes [provider]  furosemide (LASIX) 20 MG tablet Take 20 mg by mouth daily. 03/06/21  Yes [provider]  LORazepam (ATIVAN) 0.5 MG tablet Take 0.25 mg by mouth 2 (two) times daily as needed for anxiety. 01/03/21  Yes [provider]  metoprolol tartrate (LOPRESSOR) 25 MG tablet Take 1 tablet (25 mg total) by mouth 2 (two) times daily. 01/17/21 07/18/21 Yes Ghimire, Werner LeanShanker M, MD  Multiple Vitamin (MULTIVITAMIN) tablet Take 1 tablet by mouth daily with breakfast.   Yes [provider]  pantoprazole (PROTONIX) 40 MG tablet Take 1 tablet (40 mg total) by mouth daily. 02/07/21  Yes Meredith PelGuenther, Paula M, NP  polyethylene glycol (MIRALAX / GLYCOLAX) 17 g packet Take 17 g by mouth daily as needed (for constipation- mix into 4 to 8 ounces of fluid). 02/14/20  Yes [provider]  potassium chloride (KLOR-CON) 20 MEQ tablet Take 1 tablet (20 mEq total) by mouth daily. 01/17/21  Yes Ghimire, Werner LeanShanker M, MD  Probiotic Product (PROBIOTIC ADVANCED PO) Take 500 mg by mouth daily in the afternoon.   Yes [provider]  Psyllium (METAMUCIL) 48.57 % POWD See admin instructions. daily   Yes [provider]  tamsulosin (FLOMAX) 0.4 MG CAPS capsule Take 0.4 mg by mouth in the morning.   Yes [provider]  Nystatin (GERHARDT'S BUTT CREAM) CREA Apply 1 application topically daily as needed for irritation. Patient not taking: Reported on 07/18/2021 01/17/21   Maretta BeesGhimire, Shanker M, MD    Physical Exam:  Vitals:   07/19/21 0745 07/19/21 0800 07/19/21 0815 07/19/21 0830  BP: (!) 154/108 (!) 159/83 (!) 149/95 (!) 161/91  Pulse: (!) 118 95 97 (!) 130  Resp: (!) 22 13 16 17   Temp:      TempSrc:      SpO2: 99% 98% 98% 98%    Constitutional: Lethargic but arousable, oriented x1  Skin: no rashes, no lesions, poor skin turgor noted. Eyes: Pupils are equally reactive to light.  No evidence of scleral  icterus or conjunctival pallor.  ENMT: Dry mucous membranes noted.  Posterior pharynx clear of any exudate or lesions.   Neck: normal, supple, no masses, no thyromegaly.  No evidence of jugular venous distension.   Respiratory: clear to auscultation bilaterally, no wheezing, no crackles. Normal respiratory effort. No accessory muscle use.  Cardiovascular: Regular rate and rhythm, no murmurs / rubs / gallops. No extremity edema. 2+ pedal pulses. No carotid bruits.  Chest:   Nontender without crepitus or deformity.   Back:   Nontender without crepitus or deformity. Abdomen: Notable fullness in the lower abdomen with associated tenderness.  Abdomen is soft.  no evidence of intra-abdominal masses.  Positive bowel sounds noted in all quadrants.   Musculoskeletal: No joint deformity upper and lower extremities. Good ROM, no contractures. Normal muscle tone.  Neurologic: Sensation intact.  Patient moving all 4 extremities spontaneously.  Patient is not following commands consistently.  Patient is responsive to verbal stimuli.   Psychiatric: Flat affect, unable to fully assess due to depressed mentation.  Patient currently does not seem to possess insight as to their current situation.    Data Reviewed:  I have personally reviewed and interpreted  labs, imaging.  Significant findings are:  Alcohol level less than 10.   INR 1.6, PT 18.5, PTT 35.   CBC reveals white blood cell count 6.5 hemoglobin 11.9.   COVID-19 PCR testing pending.   Chemistry revealing BUN 25, creatinine of 1.35, albumin 2.9.  Urine toxicology screen negative.   Urinalysis turbid in appearance with large leukocytes and greater than 50 white blood cells per high-powered field with many bacteria. Chest x-ray personally reviewed reveals no evidence of acute cardiopulmonary process.  Chest x-ray reveals no evidence of acute cardiopulmonary process.  EKG: Personally reviewed.  Rhythm is atrial fibrillation with heart rate of 86 bpm.   No dynamic ST segment changes appreciated.   Assessment and Plan: * Acute metabolic encephalopathy Patient exhibiting progressively worsening malaise, weakness, lethargy and poor oral intake concerning for acute metabolic encephalopathy Initial work-up suggest that urinary tract infection may be the culprit with urinalysis suggestive of urinary tract infection Treating patient with intravenous ceftriaxone Hydrating patient with intravenous isotonic fluids Head imaging negative for any acute intracranial process Urine toxicology screen unremarkable. Obtaining TSH, B12, folate, ammonia Monitoring for symptomatic improvement with intravenous antibiotics  Acute cystitis without hematuria Lower abdomen is very tender on exam Will obtain bladder scan and if negative will consider CT abd/pelvis Remainder of assessment and plan as above  Alzheimer's dementia without behavioral disturbance (HCC) Longstanding known history of dementia and cognitive deficit Minimizing uncomfortable stimuli Minimizing mood altering and sedating agents Frequent redirection by staff Fall precautions   AF (paroxysmal atrial fibrillation) (HCC) Currently rate controlled. Continue home regimen of anticoagulation Continue home regimen of rate controlling agent Monitoring on telemetry   Essential hypertension Resume patients home regimen of oral antihypertensives Titrate antihypertensive regimen as necessary to achieve adequate BP control PRN intravenous antihypertensives for excessively elevated blood pressure    Mixed hyperlipidemia Continuing home regimen of lipid lowering therapy.    GERD without esophagitis Continuing home regimen of daily PPI therapy.   BPH (benign prostatic hyperplasia) Continue home regimen of Flomax  Goals of care, counseling/discussion Review of MOST form in [employer] states that patient should be comfort measures Discussed goals of care with daughter at the bedside and  confirmed that they want hospitalization for this patient as well as administration of intravenous fluids and antibiotics in this situation. They have also confirmed that patient is DNR.       Code Status:  DNR  code status decision has been confirmed with: daughter at the bedside Family Communication: Daughter is at the bedside who has been updated on plan of care.  Consults: None  Severity of Illness:  The appropriate patient status for this patient is INPATIENT. Inpatient status is judged to be reasonable and necessary in order to provide the required intensity of service to ensure the patient's safety. The patient's presenting symptoms, physical exam findings, and initial radiographic and laboratory data in the context of their chronic comorbidities is felt to place them at high risk for further clinical deterioration. Furthermore, it is not anticipated that the patient will be medically stable for discharge from the hospital within 2 midnights of admission.   * I certify that at the point of admission it is my clinical judgment that the patient will require inpatient hospital care spanning beyond 2 midnights from the point of admission due to high intensity of service, high risk for further deterioration and high frequency of surveillance required.*  Author:  Marinda Elk MD  07/19/2021 9:36 AM

## 2021-07-18 NOTE — ED Notes (Signed)
Daughter Britta Mccreedy at bedside is requesting that if anyone needs to talk to family, that they call her. Her phone number is in the chart.

## 2021-07-19 ENCOUNTER — Inpatient Hospital Stay (HOSPITAL_COMMUNITY): Payer: Medicare Other

## 2021-07-19 ENCOUNTER — Other Ambulatory Visit: Payer: Self-pay

## 2021-07-19 DIAGNOSIS — G9341 Metabolic encephalopathy: Secondary | ICD-10-CM | POA: Diagnosis not present

## 2021-07-19 DIAGNOSIS — Z7189 Other specified counseling: Secondary | ICD-10-CM

## 2021-07-19 LAB — FOLATE: Folate: 15 ng/mL (ref >5.9)

## 2021-07-19 LAB — TSH: TSH: 1.146 u[IU]/mL (ref 0.350–4.500)

## 2021-07-19 LAB — VITAMIN B12: Vitamin B-12: 1135 pg/mL — ABNORMAL HIGH (ref 180–914)

## 2021-07-19 LAB — AMMONIA: Ammonia: <10 umol/L (ref 9–35)

## 2021-07-19 MED ORDER — POLYETHYLENE GLYCOL 3350 17 G PO PACK
17.0000 g | PACK | Freq: Two times a day (BID) | ORAL | Status: DC
  Administered 2021-07-19 – 2021-07-20 (×2): 17 g via ORAL
  Filled 2021-07-19 (×3): qty 1

## 2021-07-19 MED ORDER — SENNOSIDES-DOCUSATE SODIUM 8.6-50 MG PO TABS
1.0000 | ORAL_TABLET | Freq: Two times a day (BID) | ORAL | Status: DC
  Administered 2021-07-19 – 2021-07-21 (×4): 1 via ORAL
  Filled 2021-07-19 (×4): qty 1

## 2021-07-19 MED ORDER — SODIUM CHLORIDE 0.9 % IV SOLN: INTRAVENOUS | Status: AC

## 2021-07-19 MED ORDER — BISACODYL 10 MG RE SUPP
10.0000 mg | Freq: Every day | RECTAL | Status: DC
  Administered 2021-07-19: 10 mg via RECTAL
  Filled 2021-07-19: qty 1

## 2021-07-19 NOTE — Hospital Course (Signed)
87 year old male who is a resident of [facility] with PMH of dementia, PAF, PE SP IVC filter, GI bleed, HTN, HLD, BPH, GERD presents with complaints of poor p.o. intake. Found to have UTI and constipation.

## 2021-07-19 NOTE — Assessment & Plan Note (Addendum)
Review of MOST form in [employer] states that patient should be comfort measures in case his condition worsens. Discussed goals of care with daughter at the bedside and confirmed that they want hospitalization as well as administration of intravenous fluids and antibiotics in this situation. They have also confirmed that patient is DNR. They do have authorization from patient's power of attorney.  Discussed in detail on 5/21 with daughter at bedside. Patient's current presentation with an ESBL E. coli renders poor prognosis. This can be a heralding for upcoming issues related to dehydration and poor p.o. intake.   Recommended to discuss with palliative care( who the patient is already seeing) with regards to possibility of meeting hospice criteria and transitioning to hospice at facility.

## 2021-07-19 NOTE — Progress Notes (Signed)
  Progress Note Patient: Ryan [name] LOV:[id number] DOB: [birth year] DOA: 07/18/2021  DOS: the patient was seen and examined on 07/19/2021  Brief hospital course: 87 year old male who is a resident of [facility] with PMH of dementia, PAF, PE SP IVC filter, GI bleed, HTN, HLD, BPH, GERD presents with complaints of poor p.o. intake. Found to have UTI and constipation.  Assessment and Plan: * Acute metabolic encephalopathy Patient exhibiting progressively worsening malaise, weakness, lethargy and poor oral intake concerning for acute metabolic encephalopathy Suspect due to UTI. Metabolic work-up is unremarkable. UDS unremarkable. Mild AKI as well. We will treat with IV fluids and IV antibiotics and monitor.  Acute cystitis without hematuria Initially had some tenderness on exam.  X-ray abdomen shows evidence of constipation.  No tenderness at the time of my evaluation. Treat UTI and constipation.  Alzheimer's dementia without behavioral disturbance (HCC) Longstanding known history of dementia and cognitive deficit At risk for delirium.  Atrial fibrillation, chronic (HCC) Currently rate controlled. Continue home regimen of anticoagulation Continue home regimen of rate controlling agent Monitoring on telemetry  Essential hypertension Resume patients home regimen of oral antihypertensives  Mixed hyperlipidemia Continuing home regimen of lipid lowering therapy.  GERD without esophagitis Continuing home regimen of daily PPI therapy.  BPH (benign prostatic hyperplasia) Continue home regimen of Flomax  Goals of care, counseling/discussion Review of MOST form in [facility] states that patient should be comfort measures Discussed goals of care with daughter at the bedside and confirmed that they want hospitalization for this patient as well as administration of intravenous fluids and antibiotics in this situation. They have also confirmed that patient is DNR. They do have authorization  from patient's power of attorney.  Subjective: No nausea no vomiting no fever no chills.  No chest pain abdominal pain.  Passing gas.  Physical Exam: Vitals:   07/19/21 1144 07/19/21 1309 07/19/21 1430 07/19/21 1509  BP: (!) 154/97 (!) 136/97 (!) 132/94 (!) 137/91  Pulse: (!) 102 (!) 110 75 (!) 130  Resp: (!) 21 (!) 26 (!) 21 18  Temp:    97.8 F (36.6 C)  TempSrc:      SpO2: 98% 100% 98% 95%   General: Appear in mild distress; no visible Abnormal Neck Mass Or lumps, Conjunctiva normal Cardiovascular: S1 and S2 Present, no Murmur, Respiratory: good respiratory effort, Bilateral Air entry present and CTA, no Crackles, no wheezes Abdomen: Bowel Sound present, Non tender  Extremities: no Pedal edema Neurology: alert and oriented to self Gait not checked due to patient safety concerns   Data Reviewed: I have Reviewed nursing notes, Vitals, and Lab results since pt's last encounter. Pertinent lab results CBC and BMP I have ordered test including x-ray abdomen CBC and BMP    Family Communication: Daughter was at bedside reviewed power of attorney documentation as well.  Disposition: Status is: Inpatient Remains inpatient appropriate because: Currently receiving IV antibiotics and IV fluid.  Monitor.  Author: Lynden Oxford, MD 07/19/2021 5:48 PM  Please look on [email or web address] to find out who is on call.

## 2021-07-19 NOTE — ED Notes (Signed)
Patient transported to his floor bed.Family at bedside, in possession of all belongings.

## 2021-07-20 DIAGNOSIS — K5900 Constipation, unspecified: Secondary | ICD-10-CM | POA: Diagnosis present

## 2021-07-20 DIAGNOSIS — G9341 Metabolic encephalopathy: Secondary | ICD-10-CM | POA: Diagnosis not present

## 2021-07-20 LAB — BASIC METABOLIC PANEL
Anion gap: 7 (ref 5–15)
BUN: 16 mg/dL (ref 8–23)
CO2: 25 mmol/L (ref 22–32)
Calcium: 8.4 mg/dL — ABNORMAL LOW (ref 8.9–10.3)
Chloride: 107 mmol/L (ref 98–111)
Creatinine, Ser: 1.08 mg/dL (ref 0.61–1.24)
GFR, Estimated: >60 mL/min (ref >60)
Glucose, Bld: 103 mg/dL — ABNORMAL HIGH (ref 70–99)
Potassium: 3.7 mmol/L (ref 3.5–5.1)
Sodium: 139 mmol/L (ref 135–145)

## 2021-07-20 MED ORDER — BISACODYL 10 MG RE SUPP
10.0000 mg | Freq: Once | RECTAL | Status: AC
Start: 2021-07-20 — End: 2021-07-20
  Administered 2021-07-20: 10 mg via RECTAL
  Filled 2021-07-20: qty 1

## 2021-07-20 MED ORDER — LACTULOSE 10 GM/15ML PO SOLN
20.0000 g | Freq: Once | ORAL | Status: AC
  Administered 2021-07-20: 20 g via ORAL
  Filled 2021-07-20: qty 30

## 2021-07-20 NOTE — Plan of Care (Signed)

## 2021-07-20 NOTE — Assessment & Plan Note (Addendum)
X-ray abdomen shows evidence of constipation.   No tenderness at the time of my evaluation. Treated aggressively.  Currently resolved. Likely in the setting of poor p.o. intake as well as dehydration.  Could have led to UTI as well. Patient has difficulty remaining hydrated in the setting of dementia, thus not sure if Metamucil is the right choice for this patient.  Recommend to change the bowel regimen to scheduled MiraLAX and Colace as needed for Metamucil.

## 2021-07-20 NOTE — Progress Notes (Signed)
  Progress Note Patient: Ryan [name] [id number] DOB: [birth year] DOA: 07/18/2021  DOS: the patient was seen and examined on 07/20/2021  Brief hospital course: 87 year old male who is a resident of [facility] with PMH of dementia, PAF, PE SP IVC filter, GI bleed, HTN, HLD, BPH, GERD presents with complaints of poor p.o. intake. Found to have UTI and constipation. Assessment and Plan: * Acute metabolic encephalopathy Patient exhibiting progressively worsening malaise, weakness, lethargy and poor oral intake concerning for acute metabolic encephalopathy Suspect due to UTI.  Urine culture growing E. coli.  Sensitivity pending. Metabolic work-up is unremarkable. UDS unremarkable. Mild AKI as well. Continue with antibiotics. Holding IV fluids.   Acute cystitis without hematuria Initially had some tenderness on exam.  X-ray abdomen shows evidence of constipation.  No tenderness at the time of my evaluation. Treat UTI and constipation.  Follow-up on urine culture.  Alzheimer's dementia without behavioral disturbance ([place]) Longstanding known history of dementia and cognitive deficit At risk for delirium.  Constipation Seen on x-ray. Likely in the setting of poor p.o. intake as well as dehydration.  Could have led to UTI as well. Treated aggressively.  Currently resolved.  Monitor.  Atrial fibrillation, chronic (HCC) Currently rate controlled. Continue home regimen of anticoagulation Continue home regimen of rate controlling agent Monitoring on telemetry  Essential hypertension Resume patients home regimen of oral antihypertensives  Mixed hyperlipidemia Continuing home regimen of lipid lowering therapy.  GERD without esophagitis Continuing home regimen of daily PPI therapy.  BPH (benign prostatic hyperplasia) Continue home regimen of Flomax  Goals of care, counseling/discussion Review of MOST form in [facility] states that patient should be comfort measures Discussed goals of  care with daughter at the bedside and confirmed that they want hospitalization for this patient as well as administration of intravenous fluids and antibiotics in this situation. They have also confirmed that patient is DNR. They do have authorization from patient's power of attorney.  Subjective: No nausea no vomiting.  Denies any acute complaint.  No stomach pain.  Had a BM later today.  Physical Exam: Vitals:   07/19/21 2041 07/20/21 0433 07/20/21 0734 07/20/21 1453  BP: (!) 149/99 (!) 146/92 (!) 147/80 120/73  Pulse: 92 69 95 93  Resp: 18 20 16    Temp: 98.1 F (36.7 C) 98.3 F (36.8 C) 98 F (36.7 C) 98.2 F (36.8 C)  TempSrc: Oral Oral Oral Oral  SpO2: 96% 95% 97% 96%  Weight:      Height:       General: Appear in mild distress; no visible Abnormal Neck Mass Or lumps, Conjunctiva normal Cardiovascular: S1 and S2 Present, no Murmur, Respiratory: good respiratory effort, Bilateral Air entry present and CTA, no Crackles, no wheezes Abdomen: Bowel Sound present, Non tender  Extremities: no Pedal edema Neurology: alert and oriented to time, place, and person Gait not checked due to patient safety concerns   Data Reviewed: I have Reviewed nursing notes, Vitals, and Lab results since pt's last encounter. Pertinent lab results CBC and BMP I have ordered test including BMP    Family Communication: Daughter at bedside  Disposition: Status is: Inpatient Remains inpatient appropriate because: Receiving IV antibiotics culture sensitivity pending.  Author: Berle Mull, MD 07/20/2021 6:01 PM  Please look on [email or web address] to find out who is on call.

## 2021-07-21 ENCOUNTER — Other Ambulatory Visit: Payer: Self-pay | Admitting: Internal Medicine

## 2021-07-21 DIAGNOSIS — B9689 Other specified bacterial agents as the cause of diseases classified elsewhere: Secondary | ICD-10-CM | POA: Diagnosis present

## 2021-07-21 DIAGNOSIS — N3000 Acute cystitis without hematuria: Secondary | ICD-10-CM

## 2021-07-21 DIAGNOSIS — G9341 Metabolic encephalopathy: Secondary | ICD-10-CM | POA: Diagnosis not present

## 2021-07-21 LAB — URINE CULTURE: Culture: >=100000 — AB

## 2021-07-21 MED ORDER — POLYETHYLENE GLYCOL 3350 17 G PO PACK: 17.0000 g | PACK | Freq: Every day | ORAL | 0 refills | Status: DC

## 2021-07-21 MED ORDER — FUROSEMIDE 20 MG PO TABS: 20.0000 mg | ORAL_TABLET | ORAL | 0 refills | Status: DC

## 2021-07-21 MED ORDER — FOSFOMYCIN TROMETHAMINE 3 G PO PACK
3.0000 g | PACK | Freq: Once | ORAL | Status: DC
  Filled 2021-07-21 (×2): qty 3

## 2021-07-21 MED ORDER — LORAZEPAM 0.5 MG PO TABS: 0.2500 mg | ORAL_TABLET | Freq: Two times a day (BID) | ORAL | 0 refills | Status: DC | PRN

## 2021-07-21 MED ORDER — FOSFOMYCIN TROMETHAMINE 3 G PO PACK: 3.0000 g | PACK | Freq: Once | ORAL | Status: DC

## 2021-07-21 MED ORDER — DOCUSATE SODIUM 100 MG PO CAPS: 100.0000 mg | ORAL_CAPSULE | Freq: Two times a day (BID) | ORAL | 2 refills | Status: AC | PRN

## 2021-07-21 NOTE — TOC Transition Note (Signed)
Transition of Care [facility]) - CM/SW Discharge Note   Patient Details  Name: Ryan [name] MRN: [id number] Date of Birth: 06-11-1934  [facility] Care [facility]) CM/SW Contact:  Bess Kinds, RN Phone Number: [phone] 07/21/2021, 12:34 PM   Clinical Narrative:     Spoke to patient's daughter, [name], on hospital room phone. Debbie to provide transportation home to [employer]. Discussed home health recommendations and agency choice. [name] stated that patient had been followed by [facility] in March, and would like to use them again. [name] stated that her sister has already left a message. Referral sent to [facility] liaison. HH orders written. No further TOC needs at this time.   Final next level of care: Assisted Living Barriers to Discharge: No Barriers Identified   Patient Goals and CMS Choice Patient states their goals for this hospitalization and ongoing recovery are:: return to ALF/memory care      Discharge Placement                       Discharge Plan and Services In-house Referral: Clinical Social Work                        HH Arranged: PT HH Agency: [facility] Date [facility] Agency Contacted: 07/21/21 Time HH Agency Contacted: 1233 Representative spoke with at [facility] Agency: Laurelyn Sickle  Social Determinants of Health (SDOH) Interventions     Readmission Risk Interventions    02/06/2020   12:47 PM  Readmission Risk Prevention Plan  Post Dischage Appt Complete  Medication Screening Complete  Transportation Screening Complete

## 2021-07-21 NOTE — Discharge Summary (Signed)
Physician Discharge Summary   Patient: Ryan RedwoodDavid W [name] MRN: [id number] DOB: 1934/08/14  Admit date:     07/18/2021  Discharge date: 07/21/21  Discharge Physician: Lynden OxfordPranav Arianie Couse  PCP: Merlene LaughterStoneking, Hal, MD  Recommendations at discharge: Follow up with PCP in 1 week Follow up with Palliative care and discuss about hospice transition.   Discharge Diagnoses: Principal Problem:   Acute metabolic encephalopathy Active Problems:   Alzheimer's dementia without behavioral disturbance (HCC)   Acute cystitis without hematuria   Urinary tract infection due to ESBL Klebsiella   Atrial fibrillation, chronic (HCC)   Constipation   Essential hypertension   Mixed hyperlipidemia   GERD without esophagitis   BPH (benign prostatic hyperplasia)   Goals of care, counseling/discussion  Hospital Course: 87 year old male who is a resident of [facility] with PMH of dementia, PAF, PE SP IVC filter, GI bleed, HTN, HLD, BPH, GERD presents with complaints of poor p.o. intake. Found to have UTI and constipation.  Assessment and Plan: * Acute metabolic encephalopathy Patient presented with worsening malaise, weakness, lethargy and poor oral intake concerning for acute metabolic encephalopathy. Suspect due to UTI.  Urine culture growing E. coli.  ESBL Metabolic work-up is unremarkable. Patient actually received lorazepam prior to coming to the hospital for evaluation.  Although UDS unremarkable. Mild AKI as well. Currently renal function better.  Mentation back to baseline.  Still remains fatigued.  Urinary tract infection due to ESBL Klebsiella Presents with confusion.  UA negative for nitrite but does show pyuria. Urine culture growing ESBL E. coli. Patient was treated with ceftriaxone for 2 days and then given a dose of fosfomycin. Patient actually recovered well despite not covering ESBL E. coli for 2 days, which may be explained that UTI is not the primary etiology for patient's presentation altered  mental status.   Alzheimer's dementia without behavioral disturbance (HCC) Longstanding known history of dementia and cognitive deficit At risk for delirium.  Constipation X-ray abdomen shows evidence of constipation.   No tenderness at the time of my evaluation. Treated aggressively.  Currently resolved. Likely in the setting of poor p.o. intake as well as dehydration.  Could have led to UTI as well. Patient has difficulty remaining hydrated in the setting of dementia, thus not sure if Metamucil is the right choice for this patient.  Recommend to change the bowel regimen to scheduled MiraLAX and Colace as needed for Metamucil.  Atrial fibrillation, chronic (HCC) Currently rate controlled. Continue home regimen of anticoagulation Continue home regimen of rate controlling agent  Essential hypertension Resume patients home regimen of oral antihypertensives Changing Lasix from daily to 3 times a week regimen due to concerns with hydration.  Mixed hyperlipidemia Continuing home regimen of lipid lowering therapy.  GERD without esophagitis Continuing home regimen of daily PPI therapy.  BPH (benign prostatic hyperplasia) Continue home regimen of Flomax  Goals of care, counseling/discussion Review of MOST form in [facility] states that patient should be comfort measures in case his condition worsens. Discussed goals of care with daughter at the bedside and confirmed that they want hospitalization as well as administration of intravenous fluids and antibiotics in this situation. They have also confirmed that patient is DNR. They do have authorization from patient's power of attorney.  Discussed in detail on 5/21 with daughter at bedside. Patient's current presentation with an ESBL E. coli renders poor prognosis. This can be a heralding for upcoming issues related to dehydration and poor p.o. intake.   Recommended to discuss with palliative care( who the  patient is already seeing) with  regards to possibility of meeting hospice criteria and transitioning to hospice at facility.  Consultants: none Procedures performed:  none DISCHARGE MEDICATION: Allergies as of 07/21/2021   No Known Allergies      Medication List     STOP taking these medications    Gerhardt's butt cream Crea   Metamucil 48.57 % Powd Generic drug: Psyllium   potassium chloride SA 20 MEQ tablet Commonly known as: KLOR-CON M       TAKE these medications    apixaban 5 MG Tabs tablet Commonly known as: ELIQUIS Take 1 tablet (5 mg total) by mouth 2 (two) times daily.   atorvastatin 40 MG tablet Commonly known as: LIPITOR Take 40 mg by mouth daily.   docusate sodium 100 MG capsule Commonly known as: Colace Take 1 capsule (100 mg total) by mouth 2 (two) times daily as needed for mild constipation.   ferrous sulfate 325 (65 FE) MG EC tablet Take 325 mg by mouth 2 (two) times a week.   furosemide 20 MG tablet Commonly known as: LASIX Take 1 tablet (20 mg total) by mouth every Monday, Wednesday, and Friday. Start taking on: Jul 22, 2021 What changed: when to take this   LORazepam 0.5 MG tablet Commonly known as: ATIVAN Take 0.5 tablets (0.25 mg total) by mouth 2 (two) times daily as needed for anxiety.   lubriderm seriously sensitive Lotn Apply 1 application. topically daily.   metoprolol tartrate 25 MG tablet Commonly known as: LOPRESSOR Take 1 tablet (25 mg total) by mouth 2 (two) times daily.   multivitamin tablet Take 1 tablet by mouth daily with breakfast.   pantoprazole 40 MG tablet Commonly known as: PROTONIX Take 1 tablet (40 mg total) by mouth daily.   polyethylene glycol 17 g packet Commonly known as: MIRALAX / GLYCOLAX Take 17 g by mouth daily as needed (for constipation- mix into 4 to 8 ounces of fluid). What changed: Another medication with the same name was added. Make sure you understand how and when to take each.   polyethylene glycol 17 g  packet Commonly known as: MiraLax Take 17 g by mouth daily. What changed: You were already taking a medication with the same name, and this prescription was added. Make sure you understand how and when to take each.   PROBIOTIC ADVANCED PO Take 500 mg by mouth daily in the afternoon.   tamsulosin 0.4 MG Caps capsule Commonly known as: FLOMAX Take 0.4 mg by mouth in the morning.        Follow-up Information     Stoneking, Hal, MD. Schedule an appointment as soon as possible for a visit in 2 week(s).   Specialty: Internal Medicine Contact information: [address] [place] [address] [phone]         Palliative care. Schedule an appointment as soon as possible for a visit in 1 week(s).   Why: consider discussion about hospice.               Disposition: ALF/ILF Diet recommendation: Regular diet  Discharge Exam: Vitals:   07/20/21 1453 07/20/21 1955 07/21/21 0422 07/21/21 0802  BP: 120/73 136/86 125/89 (!) 142/84  Pulse: 93 87 77 99  Resp:  20  18  Temp: 98.2 F (36.8 C) 99 F (37.2 C) 97.6 F (36.4 C) 98 F (36.7 C)  TempSrc: Oral Oral Oral   SpO2: 96% 96% 96% 96%  Weight:      Height:  General: Appear in no distress; no visible Abnormal Neck Mass Or lumps, Conjunctiva normal Cardiovascular: S1 and S2 Present, no Murmur, Respiratory: good respiratory effort, Bilateral Air entry present and CTA, no Crackles, no wheezes Abdomen: Bowel Sound present, Non tender  Extremities: no Pedal edema Neurology: alert and not oriented to time, place, and person Gait not checked due to patient safety concerns Filed Weights   07/19/21 1801  Weight: 84.1 kg   Condition at discharge: stable  The results of significant diagnostics from this hospitalization (including imaging, microbiology, ancillary and laboratory) are listed below for reference.   Imaging Studies: CT HEAD WO CONTRAST  Result Date: 07/18/2021 CLINICAL DATA:  Weakness,  loss of appetite, acute neurologic deficit. EXAM: CT HEAD WITHOUT CONTRAST TECHNIQUE: Contiguous axial images were obtained from the base of the skull through the vertex without intravenous contrast. RADIATION DOSE REDUCTION: This exam was performed according to the departmental dose-optimization program which includes automated exposure control, adjustment of the mA and/or kV according to patient size and/or use of iterative reconstruction technique. COMPARISON:  CT head dated 02/23/2021. FINDINGS: Brain: No evidence of acute infarction, hemorrhage, hydrocephalus, extra-axial collection or mass lesion/mass effect. There is moderate cerebral volume loss with associated ex vacuo dilatation. Periventricular white matter hypoattenuation likely represents chronic small vessel ischemic disease. Vascular: There are vascular calcifications in the carotid siphons. Skull: Normal. Negative for fracture or focal lesion. Sinuses/Orbits: No acute finding. Other: None. IMPRESSION: No acute intracranial process. Electronically Signed   By: Romona Curls M.D.   On: 07/18/2021 15:19   MR BRAIN WO CONTRAST  Result Date: 07/18/2021 CLINICAL DATA:  Acute neurologic deficit EXAM: MRI HEAD WITHOUT CONTRAST TECHNIQUE: Multiplanar, multiecho pulse sequences of the brain and surrounding structures were obtained without intravenous contrast. COMPARISON:  01/15/2021 FINDINGS: Brain: No acute infarct, mass effect or extra-axial collection. No acute or chronic hemorrhage. There is multifocal hyperintense T2-weighted signal within the white matter. There is advanced atrophy. The midline structures are normal. Vascular: Major flow voids are preserved. Skull and upper cervical spine: Normal calvarium and skull base. Visualized upper cervical spine and soft tissues are normal. Sinuses/Orbits:No paranasal sinus fluid levels or advanced mucosal thickening. No mastoid or middle ear effusion. Normal orbits. IMPRESSION: 1. No acute intracranial  abnormality. 2. Advanced atrophy and findings of chronic small vessel ischemia. Electronically Signed   By: Deatra Robinson M.D.   On: 07/18/2021 19:59   DG Chest Portable 1 View  Result Date: 07/18/2021 CLINICAL DATA:  Provided history: Weakness. EXAM: PORTABLE CHEST 1 VIEW COMPARISON:  Prior chest radiographs 01/15/2021 and earlier. FINDINGS: Shallow inspiration radiograph. Cardiomegaly. Aortic atherosclerosis. Minimal atelectasis within the left lung base. No evidence of pleural effusion or pneumothorax. No acute bony abnormality identified. IMPRESSION: Shallow inspiration radiograph. Minimal atelectasis within the left lung base. No appreciable airspace consolidation or pulmonary edema. Aortic Atherosclerosis (ICD10-I70.0). Electronically Signed   By: Jackey Loge D.O.   On: 07/18/2021 14:12   DG Abd Portable 1V  Result Date: 07/19/2021 CLINICAL DATA:  87 year old male with abdominal pain. Right side tenderness. UTI. EXAM: PORTABLE ABDOMEN - 1 VIEW COMPARISON:  CT Abdomen and Pelvis 12/26/2020. FINDINGS: Portable AP supine view at 0954 hours. Chronic IVC filter, left total hip arthroplasty. Non obstructed bowel gas pattern. Volume of retained rectosigmoid stool appears increased since last year. Stable cholecystectomy clips. Other abdominal and pelvic visceral contours are within normal limits. Chronic right hip osteoarthritis. No acute osseous abnormality identified. IMPRESSION: Non obstructed bowel gas pattern with increased retained rectosigmoid  stool since last year. Consider fecal impaction. Electronically Signed   By: Odessa Fleming M.D.   On: 07/19/2021 10:16    Microbiology: Results for orders placed or performed during the hospital encounter of 07/18/21  Culture, blood (Routine X 2) w Reflex to ID Panel     Status: None (Preliminary result)   Collection Time: 07/18/21 11:45 PM   Specimen: BLOOD  Result Value Ref Range Status   Specimen Description BLOOD RIGHT ANTECUBITAL  Final   Special  Requests   Final    BOTTLES DRAWN AEROBIC AND ANAEROBIC Blood Culture adequate volume   Culture   Final    NO GROWTH 2 DAYS Performed at [facility], [address], [place], [place] [address]    Report Status PENDING  Incomplete  Urine Culture     Status: Abnormal   Collection Time: 07/19/21  9:49 AM   Specimen: Urine, Clean Catch  Result Value Ref Range Status   Specimen Description URINE, CLEAN CATCH  Final   Special Requests   Final    NONE Performed at [facility], [address] [place], [place] [address]    Culture (A)  Final    >=100,000 COLONIES/mL ESCHERICHIA COLI Confirmed Extended Spectrum Beta-Lactamase Producer (ESBL).  In bloodstream infections from ESBL organisms, carbapenems are preferred over piperacillin/tazobactam. They are shown to have a lower risk of mortality.    Report Status 07/21/2021 FINAL  Final   Organism ID, Bacteria ESCHERICHIA COLI (A)  Final      Susceptibility   Escherichia coli - MIC*    AMPICILLIN >=32 RESISTANT Resistant     CEFAZOLIN >=64 RESISTANT Resistant     CEFEPIME 16 RESISTANT Resistant     CEFTRIAXONE >=64 RESISTANT Resistant     CIPROFLOXACIN >=4 RESISTANT Resistant     GENTAMICIN >=16 RESISTANT Resistant     IMIPENEM <=0.25 SENSITIVE Sensitive     NITROFURANTOIN 64 INTERMEDIATE Intermediate     TRIMETH/SULFA >=320 RESISTANT Resistant     AMPICILLIN/SULBACTAM >=32 RESISTANT Resistant     PIP/TAZO 8 SENSITIVE Sensitive     * >=100,000 COLONIES/mL ESCHERICHIA COLI    Labs: CBC: Recent Labs  Lab 07/18/21 1354  WBC 6.5  NEUTROABS 4.4  HGB 11.9*  HCT 38.0*  MCV 77.7*  PLT 170   Basic Metabolic Panel: Recent Labs  Lab 07/18/21 1611 07/20/21 0752  NA 140 139  K 3.6 3.7  CL 108 107  CO2 24 25  GLUCOSE 119* 103*  BUN 25* 16  CREATININE 1.35* 1.08  CALCIUM 8.4* 8.4*   Liver Function Tests: Recent Labs  Lab 07/18/21 1611  AST 16  ALT 18  ALKPHOS 65  BILITOT 0.5  PROT 6.2*  ALBUMIN 2.9*    CBG: No results for input(s): GLUCAP in the last 168 hours.  Discharge time spent: greater than 30 minutes.  Signed: Lynden Oxford, MD [facility]

## 2021-07-21 NOTE — Progress Notes (Signed)
Pt was discharge to facility via family car

## 2021-07-21 NOTE — Assessment & Plan Note (Signed)
Presents with confusion.  UA negative for nitrite but does show pyuria. Urine culture growing ESBL E. coli. Patient was treated with ceftriaxone for 2 days and then given a dose of fosfomycin. Patient actually recovered well despite not covering ESBL E. coli for 2 days, which may be explained that UTI is not the primary etiology for patient's presentation altered mental status.

## 2021-07-21 NOTE — TOC Initial Note (Signed)
Transition of Care [facility]) - Initial/Assessment Note    Patient Details  Name: Ryan [name] MRN: [id number] Date of Birth: Jan 05, 1935  Transition of Care [facility]) CM/SW Contact:    Ludwig Clarks, LCSW Phone Number: 07/21/2021, 11:34 AM  Clinical Narrative:                 Pt for dc back to [facility]/memory care- plans confirmed with pt's family; daughter Jackelyn Poling at bedside, and with ALF staff- [place].  Plans for PT eval here and then dc with HHPT to resume at ALF.   Expected Discharge Plan: Assisted Living Barriers to Discharge: No Barriers Identified   Patient Goals and CMS Choice Patient states their goals for this hospitalization and ongoing recovery are:: return to ALF/memory care      Expected Discharge Plan and Services Expected Discharge Plan: Assisted Living In-house Referral: Clinical Social Work     Living arrangements for the past 2 months: [place]                                      Prior Living Arrangements/Services Living arrangements for the past 2 months: [place]     Do you feel safe going back to the place where you live?: Yes      Need for Family Participation in Patient Care: Yes (Comment) Care giver support system in place?: Yes (comment)      Activities of Daily Living Home Assistive Devices/Equipment: Built-in shower seat, Environmental consultant (specify type), Wheelchair ADL Screening (condition at time of admission) Patient's cognitive ability adequate to safely complete daily activities?: No Is the patient deaf or have difficulty hearing?: No Does the patient have difficulty seeing, even when wearing glasses/contacts?: No Does the patient have difficulty concentrating, remembering, or making decisions?: Yes Patient able to express need for assistance with ADLs?: Yes Does the patient have difficulty dressing or bathing?: Yes Independently performs ADLs?: No Communication: Needs assistance Is this a  change from baseline?: Pre-admission baseline Dressing (OT): Needs assistance Is this a change from baseline?: Pre-admission baseline Grooming: Needs assistance Is this a change from baseline?: Pre-admission baseline Feeding: Independent with device (comment) Bathing: Needs assistance Is this a change from baseline?: Pre-admission baseline Toileting: Needs assistance Is this a change from baseline?: Pre-admission baseline In/Out Bed: Needs assistance Is this a change from baseline?: Pre-admission baseline Walks in Home: Needs assistance Is this a change from baseline?: Pre-admission baseline Does the patient have difficulty walking or climbing stairs?: Yes Weakness of Legs: Both Weakness of Arms/Hands: None  Permission Sought/Granted Permission sought to share information with : Facility Sport and exercise psychologist                Emotional Assessment Appearance:: Developmentally appropriate   Affect (typically observed): Withdrawn Orientation: : Fluctuating Orientation (Suspected and/or reported Sundowners)   Psych Involvement: No (comment)  Admission diagnosis:  Dehydration [E86.0] Weakness [R53.1] Elevated serum creatinine [R79.89] Acute UTI [N39.0] Failure to thrive in adult [R62.7] Generalized weakness [id number] Acute metabolic encephalopathy [id number] Anemia, unspecified type [D64.9] Dementia, unspecified dementia severity, unspecified dementia type, unspecified whether behavioral, psychotic, or mood disturbance or anxiety ([place]) [F03.90] Patient Active Problem List   Diagnosis Date Noted   Urinary tract infection due to ESBL Klebsiella 07/21/2021   Constipation 07/20/2021   Goals of care, counseling/discussion [id number]   Acute metabolic encephalopathy [id number]   GERD without esophagitis 07/18/2021   Acute cystitis  without hematuria 07/18/2021   TIA (transient ischemic attack) 01/15/2021   GI bleeding 12/26/2020   Prolonged QT interval 12/26/2020   PAF  (paroxysmal atrial fibrillation) ([place]) 02/15/2020   Alzheimer's dementia without behavioral disturbance ([place]) 02/15/2020   GI bleed 01/31/2020   Lower GI bleed    Essential hypertension    Acute GI bleeding 01/30/2020   Mixed hyperlipidemia 01/30/2020   BPH (benign prostatic hyperplasia) 01/30/2020   Anemia due to acute blood loss 01/30/2020   Atrial fibrillation, chronic ([place]) 01/30/2020   PCP:  Lajean Manes, MD Pharmacy:   [facility] [place], [place] [address] [place] [address] Phone: [phone] Fax: [phone]     Social Determinants of Health (SDOH) Interventions    Readmission Risk Interventions    02/06/2020   12:47 PM  Readmission Risk Prevention Plan  Post Dischage Appt Complete  Medication Screening Complete  Transportation Screening Complete

## 2021-07-21 NOTE — NC FL2 (Signed)
[place] LEVEL OF CARE SCREENING TOOL     IDENTIFICATION  Patient Name: Ryan [name] Birthdate: [birth year] Sex: male Admission Date (Current Location): 07/18/2021  [facility] and [place] Number:  Herbalist and Address:  [facility], [place] [address] [place], [place] [address]      Provider Number: [id number]  Attending Physician Name and Address:  Lavina Hamman, MD  Relative Name and Phone Number:  Robyn Haber (Legal Guardian)   [phone]    Current Level of Care: Hospital Recommended Level of Care: [place], Memory Care Prior Approval Number:    Date Approved/Denied:   PASRR Number:    Discharge Plan: Other (Comment) (ALF/memory care)    Current Diagnoses: Patient Active Problem List   Diagnosis Date Noted   Urinary tract infection due to ESBL Klebsiella 07/21/2021   Constipation 07/20/2021   Goals of care, counseling/discussion [id number]   Acute metabolic encephalopathy [id number]   GERD without esophagitis 07/18/2021   Acute cystitis without hematuria 07/18/2021   TIA (transient ischemic attack) 01/15/2021   GI bleeding 12/26/2020   Prolonged QT interval 12/26/2020   PAF (paroxysmal atrial fibrillation) ([place]) 02/15/2020   Alzheimer's dementia without behavioral disturbance ([place]) 02/15/2020   GI bleed 01/31/2020   Lower GI bleed    Essential hypertension    Acute GI bleeding 01/30/2020   Mixed hyperlipidemia 01/30/2020   BPH (benign prostatic hyperplasia) 01/30/2020   Anemia due to acute blood loss 01/30/2020   Atrial fibrillation, chronic ([place]) 01/30/2020    Orientation RESPIRATION BLADDER Height & Weight      (advanced dementia)  Normal Incontinent Weight: 185 lb 6.2 oz (84.1 kg) Height:  5\' 9"  (175.3 cm)  BEHAVIORAL SYMPTOMS/MOOD NEUROLOGICAL BOWEL NUTRITION STATUS      Incontinent Diet (heart healthy)  AMBULATORY STATUS COMMUNICATION OF NEEDS Skin   Limited Assist Non-Verbally  Normal                       Personal Care Assistance Level of Assistance  Bathing, Feeding, Dressing Bathing Assistance: Limited assistance Feeding assistance: Limited assistance Dressing Assistance: Limited assistance     Functional Limitations Info  Speech     Speech Info: Impaired (Impaired (dysarthria))    SPECIAL CARE FACTORS FREQUENCY  PT (By licensed PT)     PT Frequency: 2-3 times per week              Contractures Contractures Info: Not present    Additional Factors Info  Code Status Code Status Info: DNR             Current Medications (07/21/2021):  This is the current hospital active medication list Current Facility-Administered Medications  Medication Dose Route Frequency Provider Last Rate Last Admin   acetaminophen (TYLENOL) tablet 650 mg  650 mg Oral Q6H PRN Shalhoub, Sherryll Burger, MD       Or   acetaminophen (TYLENOL) suppository 650 mg  650 mg Rectal Q6H PRN Shalhoub, Sherryll Burger, MD       apixaban Arne [place]) tablet 5 mg  5 mg Oral BID Shalhoub, Sherryll Burger, MD   5 mg at 07/21/21 1007   atorvastatin (LIPITOR) tablet 40 mg  40 mg Oral Daily Vernelle Emerald, MD   40 mg at 07/21/21 1007   [START ON 07/22/2021] ferrous sulfate tablet 325 mg  325 mg Oral Once per day on Mon Thu Shalhoub, Sherryll Burger, MD  fosfomycin (MONUROL) packet 3 g  3 g Oral Once Lavina Hamman, MD       hydrALAZINE (APRESOLINE) injection 10 mg  10 mg Intravenous Q6H PRN Shalhoub, Sherryll Burger, MD       LORazepam (ATIVAN) tablet 0.25 mg  0.25 mg Oral BID PRN Shalhoub, Sherryll Burger, MD       metoprolol tartrate (LOPRESSOR) tablet 25 mg  25 mg Oral BID Vernelle Emerald, MD   25 mg at 07/21/21 1007   pantoprazole (PROTONIX) EC tablet 40 mg  40 mg Oral Daily Shalhoub, Sherryll Burger, MD   40 mg at 07/21/21 1007   polyethylene glycol (MIRALAX / GLYCOLAX) packet 17 g  17 g Oral BID Lavina Hamman, MD   17 g at 07/20/21 [id number]   senna-docusate (Senokot-S) tablet 1 tablet  1 tablet Oral BID Lavina Hamman, MD   1 tablet at 07/21/21 1007   tamsulosin (FLOMAX) capsule 0.4 mg  0.4 mg Oral q AM Shalhoub, Sherryll Burger, MD   0.4 mg at 07/21/21 [id number]     Discharge Medications: Please see discharge summary for a list of discharge medications.  Relevant Imaging Results:  Relevant Lab Results:   Additional Information: TAKE these medications     apixaban 5 MG Tabs tablet Commonly known as: ELIQUIS Take 1 tablet (5 mg total) by mouth 2 (two) times daily.    atorvastatin 40 MG tablet Commonly known as: LIPITOR Take 40 mg by mouth daily.    docusate sodium 100 MG capsule Commonly known as: Colace Take 1 capsule (100 mg total) by mouth 2 (two) times daily as needed for mild constipation.    ferrous sulfate 325 (65 FE) MG EC tablet Take 325 mg by mouth 2 (two) times a week.    furosemide 20 MG tablet Commonly known as: LASIX Take 1 tablet (20 mg total) by mouth every Monday, Wednesday, and Friday. Start taking on: Jul 22, 2021 What changed: when to take this    LORazepam 0.5 MG tablet Commonly known as: ATIVAN Take 0.5 tablets (0.25 mg total) by mouth 2 (two) times daily as needed for anxiety.    lubriderm seriously sensitive Lotn Apply 1 application. topically daily.    metoprolol tartrate 25 MG tablet Commonly known as: LOPRESSOR Take 1 tablet (25 mg total) by mouth 2 (two) times daily.    multivitamin tablet Take 1 tablet by mouth daily with breakfast.    pantoprazole 40 MG tablet Commonly known as: PROTONIX Take 1 tablet (40 mg total) by mouth daily.    polyethylene glycol 17 g packet Commonly known as: MIRALAX / GLYCOLAX Take 17 g by mouth daily as needed (for constipation- mix into 4 to 8 ounces of fluid). What changed: Another medication with the same name was added. Make sure you understand how and when to take each.    polyethylene glycol 17 g packet Commonly known as: MiraLax Take 17 g by mouth daily. What changed: You were already taking a medication with  the same name, and this prescription was added. Make sure you understand how and when to take each.    PROBIOTIC ADVANCED PO Take 500 mg by mouth daily in the afternoon.    tamsulosin 0.4 MG Caps capsule Commonly known as: FLOMAX Take 0.4 mg by mouth in the morning.         Ludwig Clarks, LCSW

## 2021-07-21 NOTE — Evaluation (Signed)
Physical Therapy Evaluation Patient Details Name: Ryan [name] MRN: [id number] DOB: [birth year] Today's Date: 07/21/2021  History of Present Illness  Pt is a 87 y.o. M who presents 07/18/2021 with complaints of poor p.o. intake. Found to have UTI and constipation. Significant PMH: dementia, PAF, PE, IVC, GIB, HTN, HLD, BPH, GERD.  Clinical Impression  PTA, pt lives at [facility] and requires assist for ADL's/mobility. Pt can ambulate limited distances with RW. Pt presents with impaired cognition (baseline), balance deficits, generalized weakness. Pt requiring moderate assist (+2 safety) for stand pivot transfer using walker. PT performed peri care due to bowel incontinence. Would benefit from HHPT upon d/c to increase mobilization.     Recommendations for follow up therapy are one component of a multi-disciplinary discharge planning process, led by the attending physician.  Recommendations may be updated based on patient status, additional functional criteria and insurance authorization.  Follow Up Recommendations Home health PT (at [place])    Assistance Recommended at Discharge Frequent or constant Supervision/Assistance  Patient can return home with the following  A lot of help with walking and/or transfers;A lot of help with bathing/dressing/bathroom    Equipment Recommendations None recommended by PT  Recommendations for Other Services       Functional Status Assessment Patient has had a recent decline in their functional status and demonstrates the ability to make significant improvements in function in a reasonable and predictable amount of time.     Precautions / Restrictions Precautions Precautions: Fall Restrictions Weight Bearing Restrictions: No      Mobility  Bed Mobility Overal bed mobility: Needs Assistance Bed Mobility: Supine to Sit     Supine to sit: Mod assist     General bed mobility comments: Pt initiating, requires truncal assist to sit  upright and use of bed pad to scoot hips out to edge    Transfers Overall transfer level: Needs assistance Equipment used: Rolling walker (2 wheels) Transfers: Sit to/from Stand, Bed to chair/wheelchair/BSC Sit to Stand: Mod assist, +2 safety/equipment Stand pivot transfers: Mod assist, +2 physical assistance         General transfer comment: Pt standing multiple times to walker with modA for peri care, pivoting to chair with max multimodal cues    Ambulation/Gait                  Stairs            Wheelchair Mobility    Modified Rankin (Stroke Patients Only)       Balance Overall balance assessment: Needs assistance Sitting-balance support: Feet supported Sitting balance-Leahy Scale: Fair Sitting balance - Comments: supervision   Standing balance support: Bilateral upper extremity supported Standing balance-Leahy Scale: Poor Standing balance comment: reliant on RW                             Pertinent Vitals/Pain Pain Assessment Pain Assessment: Faces Faces Pain Scale: No hurt    Home Living Family/patient expects to be discharged to:: Assisted living                 Home Equipment: Rolling Walker (2 wheels);BSC/3in1;Transport chair Additional Comments: ALF [facility]    Prior Function Prior Level of Function : Needs assist             Mobility Comments: uses RW for short ambulation distances with PT/family, uses transport chair for longer distances ADLs Comments: set up assist for feeding, otherwise dependent.  wears briefs     Hand Dominance   Dominant Hand: Right    Extremity/Trunk Assessment   Upper Extremity Assessment Upper Extremity Assessment: Generalized weakness    Lower Extremity Assessment Lower Extremity Assessment: Generalized weakness    Cervical / Trunk Assessment Cervical / Trunk Assessment: Kyphotic;Other exceptions Cervical / Trunk Exceptions: forward head posture  Communication    Communication: No difficulties  Cognition Arousal/Alertness: Awake/alert Behavior During Therapy: WFL for tasks assessed/performed Overall Cognitive Status: History of cognitive impairments - at baseline                                 General Comments: Hx of dementia. Pt follows 1 step commands with multimodal cues        General Comments      Exercises     Assessment/Plan    PT Assessment Patient needs continued PT services  PT Problem List Decreased strength;Decreased activity tolerance;Decreased balance;Decreased cognition;Decreased mobility;Decreased safety awareness       PT Treatment Interventions DME instruction;Gait training;Functional mobility training;Therapeutic activities;Therapeutic exercise;Balance training;Patient/family education    PT Goals (Current goals can be found in the Care Plan section)  Acute Rehab PT Goals Patient Stated Goal: pt daughter would like him to return to ALF PT Goal Formulation: With patient/family Time For Goal Achievement: 08/04/21 Potential to Achieve Goals: Fair    Frequency Min 3X/week     Co-evaluation               AM-PAC PT "6 Clicks" Mobility  Outcome Measure Help needed turning from your back to your side while in a flat bed without using bedrails?: A Lot Help needed moving from lying on your back to sitting on the side of a flat bed without using bedrails?: A Lot Help needed moving to and from a bed to a chair (including a wheelchair)?: A Lot Help needed standing up from a chair using your arms (e.g., wheelchair or bedside chair)?: A Lot Help needed to walk in hospital room?: Total Help needed climbing 3-5 steps with a railing? : Total 6 Click Score: 10    End of Session Equipment Utilized During Treatment: Gait belt Activity Tolerance: Patient tolerated treatment well Patient left: in chair;with call bell/phone within reach;with family/visitor present Nurse Communication: Mobility status PT  Visit Diagnosis: Unsteadiness on feet (R26.81);Muscle weakness (generalized) (M62.81);Difficulty in walking, not elsewhere classified (R26.2)    Time: [id number] PT Time Calculation (min) (ACUTE ONLY): 30 min   Charges:   PT Evaluation $PT Eval Moderate Complexity: 1 Mod PT Treatments $Therapeutic Activity: 8-22 mins        Wyona Almas, PT, DPT Acute Rehabilitation Services Pager [phone] Office [phone]   Deno Etienne 07/21/2021, 1:07 PM

## 2021-07-21 NOTE — Plan of Care (Signed)

## 2021-07-24 LAB — CULTURE, BLOOD (ROUTINE X 2)
Culture: NO GROWTH
Special Requests: ADEQUATE

## 2021-07-26 ENCOUNTER — Non-Acute Institutional Stay: Payer: Medicare Other | Admitting: Hospice

## 2021-07-26 DIAGNOSIS — F01C0 Vascular dementia, severe, without behavioral disturbance, psychotic disturbance, mood disturbance, and anxiety: Secondary | ICD-10-CM

## 2021-07-26 DIAGNOSIS — Z515 Encounter for palliative care: Secondary | ICD-10-CM

## 2021-07-26 DIAGNOSIS — R600 Localized edema: Secondary | ICD-10-CM

## 2021-07-26 DIAGNOSIS — E43 Unspecified severe protein-calorie malnutrition: Secondary | ICD-10-CM

## 2021-07-26 DIAGNOSIS — F419 Anxiety disorder, unspecified: Secondary | ICD-10-CM

## 2021-07-26 DIAGNOSIS — R269 Unspecified abnormalities of gait and mobility: Secondary | ICD-10-CM

## 2021-07-26 NOTE — Progress Notes (Addendum)
[facility] Consult Note Telephone: [phone]  Fax: [phone]  PATIENT NAME: Ryan [name] DOB: 1935-01-20 MRN: [id number]  PRIMARY CARE PROVIDER:   Lajean Manes, MD Lajean Manes, [place] [address] [employer] [address],  [place] [address]  REFERRING PROVIDER: Lajean Manes, MD Lajean Manes, [place] [address],  [place] [address]  RESPONSIBLE PARTY:  Jackelyn Poling is [facility] Spouse:Rachel Dimsdale: [phone] Contact Information     Name Relation Home Work Anthony, Waunita Schooner Legal Guardian   [phone]   Jackson Latino Daughter   [phone]   Kai Levins Daughter   [phone]   Reola Calkins Daughter   [phone]      Visit is to build trust and highlight Palliative Medicine as specialized medical care for people living with serious illness, aimed at facilitating better quality of life through symptoms relief, assisting with advance care planning and complex medical decision making.  NP called Debbie and updated her on visit.  RECOMMENDATIONS/PLAN:   Advance Care Planning/Code Status:Patient is a Do Not Resuscitate  Goals of Care: Goals of care include to maximize quality of life and symptom management. Comfort is the goal. MOST selections today include antibiotics as indicated, IV fluid for a defined trial period, no feeding tube, comfort measures.  DNR and MOST forms uploaded to epic; same documents in facility chart.  Visit consisted of counseling and education dealing with the complex and emotionally intense issues of symptom management and palliative care in the setting of serious and potentially life-threatening illness.   Discussion with Debbie on patient's overall functional decline and possibility of hospice service in the future. She was agreeable.  Palliative care team will continue to support patient, patient's family, and medical team.  Symptom management/Plan:  Protein caloric  malnutrition: current is 177 Ib down from 187 Ibs 07/01/21 and Albumin 2.9 07/19/23.  Offer assistance during meals to ensure adequate oral intake. Recommendation: GLucerna BID.  Routine CBC CMP. Dementia: Severe. Memory loss/confusion is worsening i and overall functional decline in line with dementia disease trajectory.  Impoverished thoughts, difficulty finding words.  FAST A. Safety precautions in place.  Optimize ongoing supportive care.   Edema in BLE: Continue Lasix as ordered. Continue with compression hose ,elevation of extremities to promote circulation.  Follow-up with cardiologist as planned.  Anxiety: Stable.  Managed with Lorazepam.   Constipation: Continue Metamucil and Miralax as ordered Gait disturbance: No recent fall.  Falls precautions.  Uses mostly transport wheelchair, max assist to stand/transfer.  Balance of rest and performance activity Follow up Palliative Care visit: Palliative care will continue to follow for complex medical decision making, advance care planning, and clarification of goals. Return 6 weeks or prn.Encouraged to call provider sooner with any concerns.   CHIEF COMPLAINT: Palliative follow up   HISTORY OF PRESENT ILLNESS:  Ryan [name] is a 87 y.o. year old male  with multiple morbidities requiring close monitoring/management with high risk of complications and morbidity: Advanced dementia, protein caloric malnutrition edema in BLE,   gait disturbance, HTN, genital herpes Afib, Hyperlipidemia.  History obtained from review of EMR, discussion with primary team, family and/or patient. Records reviewed and summarized above. All 10 point systems reviewed and are negative except as documented in history of present illness above  Review and summarization of Epic records shows history from other than patient.   Palliative Care was asked to follow this patient o help address complex decision making in the context  of advance care planning and goals of care  clarification. I reviewed, as needed, available labs, patient records, imaging, studies and related documents from the EMR.    PHYSICAL EXAM  Constitutional: NAD, FLACC 0 General: Well groomed, cooperative EYES: anicteric sclera, lids intact, no discharge ENMT: Moist mucous membrane CV: S1 S2, RRR, trace edema to BLE Pulmonary: LCTA, no increased work of breathing, no cough, Abdomen: active BS + 4 quadrants, soft and non tender, no ascites GU: no suprapubic tenderness, small red bumps/blister in thigh/groin area resolved.  MSK: weakness,  limited ROM Skin: warm and dry Neuro:  weakness, otherwise non focal, forgetful, memory loss Psych: coping Hem/lymph/immuno: no widespread bruising  PERTINENT MEDICATIONS:  Outpatient Encounter Medications as of 07/26/2021  Medication Sig   apixaban (ELIQUIS) 5 MG TABS tablet Take 1 tablet (5 mg total) by mouth 2 (two) times daily.   atorvastatin (LIPITOR) 40 MG tablet Take 40 mg by mouth daily.   docusate sodium (COLACE) 100 MG capsule Take 1 capsule (100 mg total) by mouth 2 (two) times daily as needed for mild constipation.   Emollient (LUBRIDERM SERIOUSLY SENSITIVE) LOTN Apply 1 application. topically daily.   ferrous sulfate 325 (65 FE) MG EC tablet Take 325 mg by mouth 2 (two) times a week.   furosemide (LASIX) 20 MG tablet Take 1 tablet (20 mg total) by mouth every Monday, Wednesday, and Friday.   LORazepam (ATIVAN) 0.5 MG tablet Take 0.5 tablets (0.25 mg total) by mouth 2 (two) times daily as needed for anxiety.   metoprolol tartrate (LOPRESSOR) 25 MG tablet Take 1 tablet (25 mg total) by mouth 2 (two) times daily.   Multiple Vitamin (MULTIVITAMIN) tablet Take 1 tablet by mouth daily with breakfast.   pantoprazole (PROTONIX) 40 MG tablet Take 1 tablet (40 mg total) by mouth daily.   polyethylene glycol (MIRALAX / GLYCOLAX) 17 g packet Take 17 g by mouth daily as needed (for constipation- mix into 4 to 8 ounces of fluid).   polyethylene glycol  (MIRALAX) 17 g packet Take 17 g by mouth daily.   Probiotic Product (PROBIOTIC ADVANCED PO) Take 500 mg by mouth daily in the afternoon.   tamsulosin (FLOMAX) 0.4 MG CAPS capsule Take 0.4 mg by mouth in the morning.   Facility-Administered Encounter Medications as of 07/26/2021  Medication   fosfomycin (MONUROL) packet 3 g    HOSPICE ELIGIBILITY/DIAGNOSIS: TBD  PAST MEDICAL HISTORY:  Past Medical History:  Diagnosis Date   Alzheimer disease ([place])    Benign prostatic hyperplasia 01/30/2020   Chronic anticoagulation    On eliquis   Dementia ([place])    Gait disorder    GI bleed    Hypercholesterolemia    Hypertension    Mixed hyperlipidemia 01/30/2020   Paroxysmal atrial fibrillation (H[facility])    TIA (transient ischemic attack) 01/2021      ALLERGIES: No Known Allergies    I spent 60 minutes providing this consultation; this includes time spent with patient/family, chart review and documentation. More than 50% of the time in this consultation was spent on counseling and coordinating communication   Thank you for the opportunity to participate in the care of YASSIN KRAVETS Please call our office at [phone] if we can be of additional assistance.  Note: Portions of this note were generated with Lobbyist. Dictation errors may occur despite best attempts at proofreading.  Teodoro Spray, NP

## 2021-09-04 ENCOUNTER — Encounter (HOSPITAL_COMMUNITY): Payer: Self-pay

## 2021-09-04 ENCOUNTER — Emergency Department (HOSPITAL_COMMUNITY)
Admission: EM | Admit: 2021-09-04 | Discharge: 2021-09-04 | Disposition: A | Discharge status: Home / Self Care | Payer: Medicare Other | Attending: Emergency Medicine | Admitting: Emergency Medicine

## 2021-09-04 ENCOUNTER — Emergency Department (HOSPITAL_COMMUNITY): Payer: Medicare Other

## 2021-09-04 DIAGNOSIS — I4891 Unspecified atrial fibrillation: Secondary | ICD-10-CM | POA: Insufficient documentation

## 2021-09-04 DIAGNOSIS — Z7901 Long term (current) use of anticoagulants: Secondary | ICD-10-CM | POA: Insufficient documentation

## 2021-09-04 DIAGNOSIS — R5383 Other fatigue: Secondary | ICD-10-CM | POA: Insufficient documentation

## 2021-09-04 DIAGNOSIS — F0390 Unspecified dementia without behavioral disturbance: Secondary | ICD-10-CM | POA: Diagnosis not present

## 2021-09-04 DIAGNOSIS — R600 Localized edema: Secondary | ICD-10-CM | POA: Insufficient documentation

## 2021-09-04 DIAGNOSIS — R4182 Altered mental status, unspecified: Secondary | ICD-10-CM | POA: Diagnosis present

## 2021-09-04 DIAGNOSIS — R8271 Bacteriuria: Secondary | ICD-10-CM | POA: Insufficient documentation

## 2021-09-04 LAB — COMPREHENSIVE METABOLIC PANEL
ALT: 15 U/L (ref 0–44)
AST: 12 U/L — ABNORMAL LOW (ref 15–41)
Albumin: 2.9 g/dL — ABNORMAL LOW (ref 3.5–5.0)
Alkaline Phosphatase: 72 U/L (ref 38–126)
Anion gap: 8 (ref 5–15)
BUN: 18 mg/dL (ref 8–23)
CO2: 24 mmol/L (ref 22–32)
Calcium: 8.6 mg/dL — ABNORMAL LOW (ref 8.9–10.3)
Chloride: 106 mmol/L (ref 98–111)
Creatinine, Ser: 1.22 mg/dL (ref 0.61–1.24)
GFR, Estimated: 58 mL/min — ABNORMAL LOW (ref >60)
Glucose, Bld: 111 mg/dL — ABNORMAL HIGH (ref 70–99)
Potassium: 4 mmol/L (ref 3.5–5.1)
Sodium: 138 mmol/L (ref 135–145)
Total Bilirubin: 0.7 mg/dL (ref 0.3–1.2)
Total Protein: 6.4 g/dL — ABNORMAL LOW (ref 6.5–8.1)

## 2021-09-04 LAB — CBC WITH DIFFERENTIAL/PLATELET
Abs Immature Granulocytes: 0.03 10*3/uL (ref 0.00–0.07)
Basophils Absolute: 0.1 10*3/uL (ref 0.0–0.1)
Basophils Relative: 2 %
Eosinophils Absolute: 0.1 10*3/uL (ref 0.0–0.5)
Eosinophils Relative: 2 %
HCT: 34.7 % — ABNORMAL LOW (ref 39.0–52.0)
Hemoglobin: 11.2 g/dL — ABNORMAL LOW (ref 13.0–17.0)
Immature Granulocytes: 1 %
Lymphocytes Relative: 29 %
Lymphs Abs: 1.2 10*3/uL (ref 0.7–4.0)
MCH: 25.6 pg — ABNORMAL LOW (ref 26.0–34.0)
MCHC: 32.3 g/dL (ref 30.0–36.0)
MCV: 79.2 fL — ABNORMAL LOW (ref 80.0–100.0)
Monocytes Absolute: 0.7 10*3/uL (ref 0.1–1.0)
Monocytes Relative: 17 %
Neutro Abs: 2 10*3/uL (ref 1.7–7.7)
Neutrophils Relative %: 49 %
Platelets: 225 10*3/uL (ref 150–400)
RBC: 4.38 MIL/uL (ref 4.22–5.81)
RDW: 19.8 % — ABNORMAL HIGH (ref 11.5–15.5)
WBC: 4 10*3/uL (ref 4.0–10.5)
nRBC: 0 % (ref 0.0–0.2)

## 2021-09-04 LAB — URINALYSIS, ROUTINE W REFLEX MICROSCOPIC
Bilirubin Urine: NEGATIVE
Glucose, UA: NEGATIVE mg/dL
Ketones, ur: NEGATIVE mg/dL
Nitrite: NEGATIVE
Protein, ur: 100 mg/dL — AB
Specific Gravity, Urine: 1.01 (ref 1.005–1.030)
WBC, UA: >50 WBC/hpf — ABNORMAL HIGH (ref 0–5)
pH: 6 (ref 5.0–8.0)

## 2021-09-04 MED ORDER — FOSFOMYCIN TROMETHAMINE 3 G PO PACK
3.0000 g | PACK | Freq: Once | ORAL | Status: AC
  Administered 2021-09-04: 3 g via ORAL
  Filled 2021-09-04: qty 3

## 2021-09-04 NOTE — Progress Notes (Addendum)
Manufacturing engineer [facility]) Hospitalized hospice patient RN note  [name] is a current hospice patient with [facility] for a CTI of mixed alzheimer's with vascular dementia and behavioral disturbance. He was at [facility] where they noticed a change in AMS and initiated call to EMS. He was then brought to ED for evaluation of AMS.   Met with patient and daughter at bedside where patient was alert to voice and able to answer simple pain related questions.  Patient resting comfortably, and stated that he was not in any pain. Daughter voiced concerns about facility sending him to ED before contacting hospice. She does have a MOST form that came with the patient and is at bedside. She does want patient to have IVF if needed for dehydration as well as IV antibiotics for infection. I discussed drawing some labs and getting UA to r/o infection.   VS: 129/80, 103 HR, RR 19,  98% RA I&O:0/250 Labs:  Latest Reference Range & Units 09/04/21 11:45  COMPREHENSIVE METABOLIC PANEL  Rpt !  Sodium 135 - 145 mmol/L 138  Potassium 3.5 - 5.1 mmol/L 4.0  Chloride 98 - 111 mmol/L 106  CO2 22 - 32 mmol/L 24  Glucose 70 - 99 mg/dL 111 (H)  BUN 8 - 23 mg/dL 18  Creatinine 0.61 - 1.24 mg/dL 1.22  Calcium 8.9 - 10.3 mg/dL 8.6 (L)  Anion gap 5 - 15  8  Alkaline Phosphatase 38 - 126 U/L 72  Albumin 3.5 - 5.0 g/dL 2.9 (L)  AST 15 - 41 U/L 12 (L)  ALT 0 - 44 U/L 15  Total Protein 6.5 - 8.1 g/dL 6.4 (L)  Total Bilirubin 0.3 - 1.2 mg/dL 0.7  GFR, Estimated >60 mL/min 58 (L)  WBC 4.0 - 10.5 K/uL 4.0  RBC 4.22 - 5.81 MIL/uL 4.38  Hemoglobin 13.0 - 17.0 g/dL 11.2 (L)  HCT 39.0 - 52.0 % 34.7 (L)  MCV 80.0 - 100.0 fL 79.2 (L)  MCH 26.0 - 34.0 pg 25.6 (L)  MCHC 30.0 - 36.0 g/dL 32.3  RDW 11.5 - 15.5 % 19.8 (H)  Platelets 150 - 400 K/uL 225  nRBC 0.0 - 0.2 % 0.0  Neutrophils % 49  Lymphocytes % 29  Monocytes Relative % 17  Eosinophil % 2  Basophil % 2  Immature Granulocytes % 1  NEUT# 1.7 - 7.7 K/uL 2.0   Lymphocyte # 0.7 - 4.0 K/uL 1.2  Monocyte # 0.1 - 1.0 K/uL 0.7  Eosinophils Absolute 0.0 - 0.5 K/uL 0.1  Basophils Absolute 0.0 - 0.1 K/uL 0.1  Abs Immature Granulocytes 0.00 - 0.07 K/uL 0.03  !: Data is abnormal   Diagnosis: IMPRESSION: Asymmetric elevation of the right hemidiaphragm and probable left basilar atelectasis. Overall, no significant interval change since 07/18/2021.     Electronically Signed   By: Valetta Mole M.D.   On: 09/04/2021 08:07   Latest Reference Range & Units 09/04/21 13:27  URINALYSIS, ROUTINE W REFLEX MICROSCOPIC  Rpt !  Appearance CLEAR  TURBID !  Bilirubin Urine NEGATIVE  NEGATIVE  Color, Urine YELLOW  AMBER !  Glucose, UA NEGATIVE mg/dL NEGATIVE  Hgb urine dipstick NEGATIVE  MODERATE !  Ketones, ur NEGATIVE mg/dL NEGATIVE  Leukocytes,Ua NEGATIVE  MODERATE !  Nitrite NEGATIVE  NEGATIVE  pH 5.0 - 8.0  6.0  Protein NEGATIVE mg/dL 100 !  Specific Gravity, Urine 1.005 - 1.030  1.010  Bacteria, UA NONE SEEN  MANY !  RBC / HPF 0 - 5 RBC/hpf 6-10  WBC Clumps  PRESENT  WBC, UA 0 - 5 WBC/hpf >50 (H)  !: Data is abnormal   DC planning: Back to [facility] once stable with ACC GOC: clear. DNR see above note for IV meds IDT: updated Family:updated at bedside.   Please use [facility] for transport as this is a current hospice patient and is contracted through [facility]. Facility is [facility].   Thank you, Clementeen Hoof, [place], RN Hospice Liaison [phone]

## 2021-09-04 NOTE — ED Notes (Signed)
Pt is resting with eye closed. Daughter at beside waiting on hospice team to come speak with them.  Waiting on blood work until then, PA made aware.

## 2021-09-04 NOTE — ED Triage Notes (Signed)
BIB EMS, pt has hx of altered mental status, however per facility, pt has been more confused

## 2021-09-04 NOTE — ED Provider Notes (Signed)
[facility] EMERGENCY DEPARTMENT Provider Note   CSN: [id number] Arrival date & time: 09/04/21  6712     History  Chief Complaint  Patient presents with   Altered Mental Status    Ryan [name] is a 87 y.o. male with a hx of alzheimer's dementia, hyperlipidemia, anemia, chronic atrial fibrillation on eliquis, ESBL, and prior GI bleed who presents to the ED via EMS from [facility] for evaluation of AMS this morning.   Per EMS to triage RN patient was holding his side, drooling and seemed more confused prompting ED visit. I attempted to call [facility]- no clinical staff able to take my call for additional history, message left for call back. Patient's daughter arrived @ bedside, she reports he appears @ his mental status baseline which is A&Ox1, he is currently on hospice care and she is unsure why he is here. Currently patient denies pain.    HPI     Home Medications Prior to Admission medications   Medication Sig Start Date End Date Taking? Authorizing Provider  apixaban (ELIQUIS) 5 MG TABS tablet Take 1 tablet (5 mg total) by mouth 2 (two) times daily. 01/17/21   Ghimire, Werner Lean, MD  atorvastatin (LIPITOR) 40 MG tablet Take 40 mg by mouth daily.    [provider]  docusate sodium (COLACE) 100 MG capsule Take 1 capsule (100 mg total) by mouth 2 (two) times daily as needed for mild constipation. 07/21/21 07/21/22  Rolly Salter, MD  Emollient (LUBRIDERM SERIOUSLY SENSITIVE) LOTN Apply 1 application. topically daily.    [provider]  ferrous sulfate 325 (65 FE) MG EC tablet Take 325 mg by mouth 2 (two) times a week.    [provider]  furosemide (LASIX) 20 MG tablet Take 1 tablet (20 mg total) by mouth every Monday, Wednesday, and Friday. 07/22/21   Rolly Salter, MD  LORazepam (ATIVAN) 0.5 MG tablet Take 0.5 tablets (0.25 mg total) by mouth 2 (two) times daily as needed for anxiety. 07/21/21   Rolly Salter, MD  metoprolol  tartrate (LOPRESSOR) 25 MG tablet Take 1 tablet (25 mg total) by mouth 2 (two) times daily. 01/17/21 07/18/21  Ghimire, Werner Lean, MD  Multiple Vitamin (MULTIVITAMIN) tablet Take 1 tablet by mouth daily with breakfast.    [provider]  pantoprazole (PROTONIX) 40 MG tablet Take 1 tablet (40 mg total) by mouth daily. 02/07/21   Meredith Pel, NP  polyethylene glycol (MIRALAX / GLYCOLAX) 17 g packet Take 17 g by mouth daily as needed (for constipation- mix into 4 to 8 ounces of fluid). 02/14/20   [provider]  polyethylene glycol (MIRALAX) 17 g packet Take 17 g by mouth daily. 07/21/21   Rolly Salter, MD  Probiotic Product (PROBIOTIC ADVANCED PO) Take 500 mg by mouth daily in the afternoon.    [provider]  tamsulosin (FLOMAX) 0.4 MG CAPS capsule Take 0.4 mg by mouth in the morning.    [provider]      Allergies    Patient has no known allergies.    Review of Systems   Review of Systems  Unable to perform ROS: Dementia    Physical Exam Updated Vital Signs BP 134/70 (BP Location: Right Arm)   Pulse (!) 109   Temp (!) 97.3 F (36.3 C) (Axillary)   Resp 17   SpO2 100%  Physical Exam Vitals and nursing note reviewed.  Constitutional:      General: He  is not in acute distress.    Appearance: He is well-developed. He is not toxic-appearing.  HENT:     Head: Normocephalic and atraumatic.  Eyes:     General:        Right eye: No discharge.        Left eye: No discharge.     Conjunctiva/sclera: Conjunctivae normal.  Cardiovascular:     Rate and Rhythm: Rhythm irregular.     Comments: HR 90 bpm.  Pulmonary:     Effort: No respiratory distress.     Breath sounds: Normal breath sounds. No wheezing or rales.  Chest:     Chest wall: No tenderness.  Abdominal:     General: There is no distension.     Palpations: Abdomen is soft.     Tenderness: There is no abdominal tenderness. There is no guarding or rebound.  Musculoskeletal:      Cervical back: Neck supple.     Right lower leg: Edema present.     Left lower leg: Edema present.  Skin:    General: Skin is warm and dry.  Neurological:     Mental Status: He is alert.     Comments: Sleeping, arousible to voice   Psychiatric:        Behavior: Behavior normal.     ED Results / Procedures / Treatments   Labs (all labs ordered are listed, but only abnormal results are displayed) Labs Reviewed - No data to display  EKG EKG Interpretation  Date/Time:  Wednesday September 04 2021 06:11:22 EDT Ventricular Rate:  95 PR Interval:    QRS Duration: 101 QT Interval:  337 QTC Calculation: 424 R Axis:   19 Text Interpretation: Atrial fibrillation Borderline repolarization abnormality Artifact in lead(s) I II aVR aVL aVF V1 V2 Interpretation limited secondary to artifact Confirmed by Zadie Rhine ([id number]) on 09/04/2021 6:45:35 AM  Radiology DG Chest 2 View  Result Date: 09/04/2021 CLINICAL DATA:  Left-sided chest pain EXAM: CHEST - 2 VIEW COMPARISON:  Chest radiograph 07/19/2018 FINDINGS: The cardiomediastinal silhouette is stable. There is unchanged asymmetric elevation of the right hemidiaphragm. Patchy opacities in the left lung base likely reflect subsegmental atelectasis. There is no other focal airspace disease. There is no significant pleural effusion. There is no pulmonary edema. There is no pneumothorax There is no acute osseous abnormality. IMPRESSION: Asymmetric elevation of the right hemidiaphragm and probable left basilar atelectasis. Overall, no significant interval change since 07/18/2021. Electronically Signed   By: Lesia Hausen M.D.   On: 09/04/2021 08:07    Procedures Procedures    Medications Ordered in ED Medications  fosfomycin (MONUROL) packet 3 g (has no administration in time range)    ED Course/ Medical Decision Making/ A&P                           Medical Decision Making Amount and/or Complexity of Data Reviewed Labs: ordered. Radiology:  ordered.  Risk Prescription drug management.   Patient presents to the ED for evaluation of reported altered mental status at facility and some concern for discomfort to his left side.  Patient is nontoxic, has mild tachycardia resolved on my exam.  Patient sleeping, arouses to voice, denies pain at present.  His daughter at bedside states that he seems to be at his mental status baseline, has been a bit sleepier over the past couple of days.  She states that he is on hospice and is asking if hospice has  been contacted.  I was unable to get in touch with patient's facility for additional history.  I placed a call to the hospice team and am pending callback from hospice nurse.  Additional history obtained:  Chart/nursing notes reviewed.   EKG: No STEMI- Atrial fibrillation Borderline repolarization abnormality Artifact in lead(s) I II aVR aVL aVF V1 V2 Interpretation limited secondary to artifact   Lab Tests:  I viewed & interpreted labs including:  CBC: Mild anemia similar to prior CMP: Hypoalbuminemia and hypocalcemia similar to prior Urinalysis: Concerning for infection.  Imaging Studies:  I ordered and viewed the following imaging, agree with radiologist impression:  Chest x-ray: Asymmetric elevation of the right hemidiaphragm and probable left basilar atelectasis. Overall, no significant interval change since 07/18/2021.  Patient's ED work-up, course, and plan were dictated through shared decision-making with the patient as well as hospice team RN Melissa.  Ultimately patient's labs appear similar to prior.  His urinalysis does have many bacteria and is concerning for infection-this was sent for culture, he does have a history of ESBL, his most recent culture was reviewed. Ultimately at present patient does not appear toxic or septic, no specific urinary symptoms- his daughter relays he has not done well when hospitalized in the past and is on hospice, feel he is reasonable for discharge  back to facility at this time- attending in agreement recommends oral abx, I discussed with the pharmacy team, recommended oral dose of fosfomycin. Fosfomycin given in the ED. Will discharge back to facility. I discussed results, treatment plan, need for follow-up, and return precautions with the patient's daughter @ bedside. Provided opportunity for questions, patient's daughter confirmed understanding and is in agreement with plan.   Findings and plan of care discussed with supervising physician Dr. Durwin Nora who is in agreement.   Portions of this note were generated with Scientist, clinical (histocompatibility and immunogenetics). Dictation errors may occur despite best attempts at proofreading.   Final Clinical Impression(s) / ED Diagnoses Final diagnoses:  Bacteriuria  Fatigue, unspecified type    Rx / DC Orders ED Discharge Orders     None         Cherly Anderson, PA-C 09/04/21 1503    Gloris Manchester, MD 09/04/21 [phone]

## 2021-09-04 NOTE — Discharge Instructions (Signed)
You were seen in the emergency department this morning and had blood work, chest x-ray, and a urine sample.  Your blood work overall looks similar to prior with a hemoglobin of 11.2, mildly low protein, and mildly low calcium.  Your chest x-ray did not show any new findings.  Your urine did have bacteria in it, we have given you dose of oral antibiotics to try to help with this in the emergency department, please follow-up with your primary care provider, we have also sent this for culture.  Return to the ER for new or worsening symptoms including but not limited to new or worsening pain, fever, vomiting, or any other concerns.

## 2021-09-06 LAB — URINE CULTURE: Culture: >=100000 — AB

## 2021-09-07 ENCOUNTER — Telehealth (HOSPITAL_BASED_OUTPATIENT_CLINIC_OR_DEPARTMENT_OTHER): Payer: Self-pay | Admitting: *Deleted

## 2021-09-07 NOTE — Telephone Encounter (Signed)
Post ED Visit - Positive Culture Follow-up  Culture report reviewed by antimicrobial stewardship pharmacist: [facility] []  , Pharm.D. []  Enzo Bi, Pharm.D., BCPS AQ-ID []  , Pharm.D., BCPS []  Celedonio Miyamoto, Pharm.D., BCPS []  [place], Garvin Fila.D., BCPS, AAHIVP []  , Pharm.D., BCPS, AAHIVP []  Georgina Pillion, PharmD, BCPS []  , PharmD, BCPS []  [place], PharmD, BCPS []  [address], PharmD []  , PharmD, BCPS [x]  Estella Husk, PharmD  Pharmacy Team []  Lysle Pearl, PharmD []  , PharmD []  Phillips Climes, PharmD []  , Rph []  Agapito Games) , PharmD []  Verlan Friends, PharmD []  , PharmD []  Mervyn Gay, PharmD []  , PharmD []  Daylene Posey, PharmD []  [facility], PharmD []  , PharmD []  Len Childs, PharmD   Positive urine culture Treated with Fosfomycin,  and no further patient follow-up is required at this time.  09/07/2021, 11:04 AM

## 2022-09-28 ENCOUNTER — Inpatient Hospital Stay (HOSPITAL_COMMUNITY)
Admission: EM | Admit: 2022-09-28 | Discharge: 2022-10-06 | DRG: 378 | Disposition: A | Discharge status: Skilled Nursing Facility | Source: Skilled Nursing Facility | Attending: Internal Medicine | Admitting: Internal Medicine

## 2022-09-28 ENCOUNTER — Encounter (HOSPITAL_COMMUNITY): Payer: Self-pay

## 2022-09-28 ENCOUNTER — Other Ambulatory Visit: Payer: Self-pay

## 2022-09-28 DIAGNOSIS — I10 Essential (primary) hypertension: Secondary | ICD-10-CM | POA: Diagnosis present

## 2022-09-28 DIAGNOSIS — Z515 Encounter for palliative care: Secondary | ICD-10-CM | POA: Diagnosis not present

## 2022-09-28 DIAGNOSIS — F02C0 Dementia in other diseases classified elsewhere, severe, without behavioral disturbance, psychotic disturbance, mood disturbance, and anxiety: Secondary | ICD-10-CM | POA: Diagnosis present

## 2022-09-28 DIAGNOSIS — K219 Gastro-esophageal reflux disease without esophagitis: Secondary | ICD-10-CM | POA: Diagnosis present

## 2022-09-28 DIAGNOSIS — D62 Acute posthemorrhagic anemia: Secondary | ICD-10-CM | POA: Diagnosis present

## 2022-09-28 DIAGNOSIS — Z79899 Other long term (current) drug therapy: Secondary | ICD-10-CM | POA: Diagnosis not present

## 2022-09-28 DIAGNOSIS — I480 Paroxysmal atrial fibrillation: Secondary | ICD-10-CM | POA: Diagnosis present

## 2022-09-28 DIAGNOSIS — N179 Acute kidney failure, unspecified: Secondary | ICD-10-CM | POA: Diagnosis present

## 2022-09-28 DIAGNOSIS — Z96649 Presence of unspecified artificial hip joint: Secondary | ICD-10-CM | POA: Diagnosis present

## 2022-09-28 DIAGNOSIS — K5731 Diverticulosis of large intestine without perforation or abscess with bleeding: Secondary | ICD-10-CM | POA: Diagnosis not present

## 2022-09-28 DIAGNOSIS — Z8673 Personal history of transient ischemic attack (TIA), and cerebral infarction without residual deficits: Secondary | ICD-10-CM | POA: Diagnosis not present

## 2022-09-28 DIAGNOSIS — E782 Mixed hyperlipidemia: Secondary | ICD-10-CM | POA: Diagnosis present

## 2022-09-28 DIAGNOSIS — G309 Alzheimer's disease, unspecified: Secondary | ICD-10-CM | POA: Diagnosis present

## 2022-09-28 DIAGNOSIS — N400 Enlarged prostate without lower urinary tract symptoms: Secondary | ICD-10-CM | POA: Diagnosis present

## 2022-09-28 DIAGNOSIS — Z66 Do not resuscitate: Secondary | ICD-10-CM | POA: Diagnosis present

## 2022-09-28 DIAGNOSIS — Z7901 Long term (current) use of anticoagulants: Secondary | ICD-10-CM

## 2022-09-28 DIAGNOSIS — K625 Hemorrhage of anus and rectum: Secondary | ICD-10-CM | POA: Diagnosis not present

## 2022-09-28 DIAGNOSIS — K922 Gastrointestinal hemorrhage, unspecified: Secondary | ICD-10-CM | POA: Diagnosis not present

## 2022-09-28 DIAGNOSIS — F0280 Dementia in other diseases classified elsewhere without behavioral disturbance: Secondary | ICD-10-CM | POA: Diagnosis not present

## 2022-09-28 DIAGNOSIS — K921 Melena: Secondary | ICD-10-CM | POA: Diagnosis not present

## 2022-09-28 DIAGNOSIS — I482 Chronic atrial fibrillation, unspecified: Secondary | ICD-10-CM | POA: Diagnosis not present

## 2022-09-28 DIAGNOSIS — Z993 Dependence on wheelchair: Secondary | ICD-10-CM

## 2022-09-28 LAB — COMPREHENSIVE METABOLIC PANEL
ALT: 11 U/L (ref 0–44)
AST: 12 U/L — ABNORMAL LOW (ref 15–41)
Albumin: 2.9 g/dL — ABNORMAL LOW (ref 3.5–5.0)
Alkaline Phosphatase: 64 U/L (ref 38–126)
Anion gap: 11 (ref 5–15)
BUN: 59 mg/dL — ABNORMAL HIGH (ref 8–23)
CO2: 21 mmol/L — ABNORMAL LOW (ref 22–32)
Calcium: 8.3 mg/dL — ABNORMAL LOW (ref 8.9–10.3)
Chloride: 106 mmol/L (ref 98–111)
Creatinine, Ser: 2.21 mg/dL — ABNORMAL HIGH (ref 0.61–1.24)
GFR, Estimated: 28 mL/min — ABNORMAL LOW (ref >60)
Glucose, Bld: 127 mg/dL — ABNORMAL HIGH (ref 70–99)
Potassium: 4.4 mmol/L (ref 3.5–5.1)
Sodium: 138 mmol/L (ref 135–145)
Total Bilirubin: 0.6 mg/dL (ref 0.3–1.2)
Total Protein: 6.7 g/dL (ref 6.5–8.1)

## 2022-09-28 LAB — IRON AND TIBC
Iron: 13 ug/dL — ABNORMAL LOW (ref 45–182)
Saturation Ratios: 4 % — ABNORMAL LOW (ref 17.9–39.5)
TIBC: 321 ug/dL (ref 250–450)
UIBC: 308 ug/dL

## 2022-09-28 LAB — CBC WITH DIFFERENTIAL/PLATELET
Abs Immature Granulocytes: 0.12 10*3/uL — ABNORMAL HIGH (ref 0.00–0.07)
Basophils Absolute: 0 10*3/uL (ref 0.0–0.1)
Basophils Relative: 0 %
Eosinophils Absolute: 0 10*3/uL (ref 0.0–0.5)
Eosinophils Relative: 0 %
HCT: 24.6 % — ABNORMAL LOW (ref 39.0–52.0)
Hemoglobin: 7.5 g/dL — ABNORMAL LOW (ref 13.0–17.0)
Immature Granulocytes: 1 %
Lymphocytes Relative: 9 %
Lymphs Abs: 1 10*3/uL (ref 0.7–4.0)
MCH: 24.3 pg — ABNORMAL LOW (ref 26.0–34.0)
MCHC: 30.5 g/dL (ref 30.0–36.0)
MCV: 79.6 fL — ABNORMAL LOW (ref 80.0–100.0)
Monocytes Absolute: 1.1 10*3/uL — ABNORMAL HIGH (ref 0.1–1.0)
Monocytes Relative: 10 %
Neutro Abs: 8.6 10*3/uL — ABNORMAL HIGH (ref 1.7–7.7)
Neutrophils Relative %: 80 %
Platelets: 229 10*3/uL (ref 150–400)
RBC: 3.09 MIL/uL — ABNORMAL LOW (ref 4.22–5.81)
RDW: 18.4 % — ABNORMAL HIGH (ref 11.5–15.5)
WBC: 10.8 10*3/uL — ABNORMAL HIGH (ref 4.0–10.5)
nRBC: 0 % (ref 0.0–0.2)

## 2022-09-28 LAB — TYPE AND SCREEN
ABO/RH(D): O POS
Antibody Screen: NEGATIVE
Unit division: 0

## 2022-09-28 LAB — PREPARE RBC (CROSSMATCH)

## 2022-09-28 LAB — BPAM RBC
Blood Product Expiration Date: 202408262359
ISSUE DATE / TIME: 202407281303
Unit Type and Rh: 5100

## 2022-09-28 LAB — HEMOGLOBIN AND HEMATOCRIT, BLOOD
HCT: 22.1 % — ABNORMAL LOW (ref 39.0–52.0)
HCT: 24.5 % — ABNORMAL LOW (ref 39.0–52.0)
Hemoglobin: 6.7 g/dL — CL (ref 13.0–17.0)
Hemoglobin: 7.8 g/dL — ABNORMAL LOW (ref 13.0–17.0)

## 2022-09-28 LAB — MRSA NEXT GEN BY PCR, NASAL: MRSA by PCR Next Gen: NOT DETECTED

## 2022-09-28 LAB — I-STAT CG4 LACTIC ACID, ED: Lactic Acid, Venous: 0.9 mmol/L (ref 0.5–1.9)

## 2022-09-28 LAB — PROTIME-INR
INR: 1.4 — ABNORMAL HIGH (ref 0.8–1.2)
Prothrombin Time: 17.6 seconds — ABNORMAL HIGH (ref 11.4–15.2)

## 2022-09-28 MED ORDER — SODIUM CHLORIDE 0.9% IV SOLUTION: Freq: Once | INTRAVENOUS | Status: AC

## 2022-09-28 MED ORDER — SODIUM CHLORIDE 0.9 % IV BOLUS
500.0000 mL | Freq: Once | INTRAVENOUS | Status: AC
  Administered 2022-09-28: 500 mL via INTRAVENOUS

## 2022-09-28 MED ORDER — LORAZEPAM 0.5 MG PO TABS
0.2500 mg | ORAL_TABLET | Freq: Two times a day (BID) | ORAL | Status: DC | PRN
  Administered 2022-09-30 – 2022-10-04 (×4): 0.25 mg via ORAL
  Filled 2022-09-28 (×4): qty 1

## 2022-09-28 MED ORDER — ENSURE ENLIVE PO LIQD
237.0000 mL | Freq: Two times a day (BID) | ORAL | Status: DC
  Administered 2022-09-29 – 2022-10-03 (×8): 237 mL via ORAL

## 2022-09-28 NOTE — ED Notes (Signed)
Patients family concerned after giving water to patient. RN assessed the patient he does have some rattling in the throat not the lungs. RN reached out to the admitting doctor to inquire about a swallowing evaluation. Waiting on response. Asked family not to give anything else by mouth until we hear from the doctor. Family understands and in agreement.

## 2022-09-28 NOTE — Progress Notes (Signed)
Architectural technologist and Current Medication List  Bereavement v24.1 (Transfer Summary v24.1) Routine visit - on 09-28-2022 Civil engineer, contracting (Formerly [facility]) Patient: Ryan [name] Ryan [name], Ryan [name] Transfer Date Transfer Transfer Summary Date: 7.28.2024 Transfer Summary Transfer Summary Transfer Info [name] Related to Transfer: Dx: Alzheimers Status/Patient Condition upon Transfer: Details: stable Services Provided: (IDT): updated of transfer Ongoing Needs Not Met: Details, Family reports dark red blood from rectum for 1-2 days. Taken to ED at [facility] for evaluation. Hospice was not notified by family. Received call from Dr. Lowell Guitar from [facility] ED to notify that patient was in ED. Transfer Order/Instructions: Physician Name & Phone Number + Transfer Order Details, Kirt Boys Primary Caregiver: PCG/phone number, Hermine Messick [phone] Significant Health History: Describe, Alzheimers Pain and symptom management: Details, rectal bleeding- dark red blood for 1-2 days Patient Transfer Info Custom questions Patient went from: Location: [facility] Patient went to: Location: [facility] Date Transfer Report Given: Date: 7.28.2024 Transfer Report Given to: (role + full name): Dr. Grier Mitts, Hospitalist Any other information that was provided to the receiving org: Current hospice [name] and goals of care discussion ED had with patient/family regarding blood transfusions. Radiographer, therapeutic (Select all that apply): DNR End of Life Decisions/Preferences: Details: DNR in place Advanced Directives & End-of-Life Preferences Signed by: Norris Cross, RN Report generated by: Printed: 09-28-2022 at 16:45 EDT by Renea Ee     Medication List for Ryan [name] Homecare As of:  09-28-2022 Allergies: No Known Drug Allergies Patient Name (ID): Ryan [name], Ryan [name] (Ryan [name]: Alzheimer's disease, unspecified ( G30.9 ) Insurance: Medicare Benefit Date of Birth: December 18, 1934 Description/Dose/Frequency/Route Start Date End Date Actual End Date Covered by Hospice Flagyl By Mouth Tablet 500 MG 09-27-2022 Yes 1 Tablet 3 Times a Day PO Special Instructions: For 7 days May Administer: SN/CG Ciprofloxacin HCl By Mouth Tablet 500 MG 09-27-2022 Yes 1 Tablet 2 Times a Day PO Special Instructions: For 7 days May Administer: SN/CG Tylenol By Mouth Tablet 325 MG 04-15-2022 Yes 2 Tablet PRN as Needed Every 6 Hours PO Special Instructions: Pain or fever >100.5 May Administer: Geri-Tussin By Mouth Syrup 100 MG/5ML 02-19-2022 Yes 10 Milliliter PRN as Needed Every 12 Hours PO Special Instructions: For cough/congestion May Administer: Senna By Mouth Tablet 8.6 MG 02-03-2022 Yes 2 Tablet 1 Time a Day PO Special Instructions: May Administer: Lasix By Mouth Tablet 40 MG 01-30-2022 Yes 1 Tablet 1 Time a Day PO Special Instructions: May Administer: Olopatadine HCl Ophthalmic Solution 0.2 % 12-19-2021 No 1 Drop PRN as Needed Once Daily OP Special Instructions: Unrelated; Seasonal Allergies May Administer: Ativan By Mouth Tablet 0.5 MG 08-02-2021 Yes 1/2 Tablet PRN as Needed Twice Daily PO Special Instructions: Anxiety May Administer: Eliquis By Mouth Tablet 5 MG 08-02-2021 Yes 1 Tablet 2 Times a Day PO Related: medical exemption by Dr. Kirt Boys Special Instructions: May Administer: Lubriderm External Lotion 08-02-2021 No 1 Lotion 1 Time a Day EX  Special Instructions: Unrelated: dry skin May Administer:  Medication List for Ryan [name], Ryan [name] [facility] Homecare As of: 09-28-2022 Allergies: No Known Drug Allergies Patient Name (ID): Ryan [name], Ryan [name] ([id number]) Team: [facility] Primary [name]: Alzheimer's disease, unspecified (  G30.9 ) Insurance: Medicare Benefit Date of Birth: December 19, 1934 Description/Dose/Frequency/Route Start Date End Date Actual End Date Covered by Hospice Metoprolol Tartrate By Mouth Tablet  25 MG 08-02-2021 Yes 1 Tablet 2 Times a Day PO Special Instructions: May Administer: Polyethylene Glycol 3350 By Mouth Packet 17 GM 08-06-2021 Yes 17 Gram PRN as Needed Once Daily PO Mix 17gms in liquid and drink by mouth every day as needed if no bowel movement for 24 hours Special Instructions: May Administer: Potassium Chloride ER By Mouth Capsule Extended 08-09-2021 Yes Release 10 MEQ 2 Capsule 1 Time a Day PO Special Instructions: May Administer: * Highlighted medication indicates inactive part of medication group as of reporting date  Norris Cross, RN Nurse Liaison [phone]

## 2022-09-28 NOTE — Progress Notes (Signed)
History and Physical    ELKAN LESSMAN ZOX:[id number] DOB: [birth year] DOA: 09/28/2022  PCP: Patient, No Pcp Per  Patient coming from: [place]  I have personally briefly reviewed patient's old medical records in [facility] Link  Chief Complaint: BRBPR  HPI: Ryan [name] is Elianys Conry 88 y.o. male with medical history significant of demntia, atrial fibrilaltion on eliquis, hx GI bleed, BPH and multiple other medical problems presenting with bright red blood per rectum.  History with assistance from daughters given dementia.    He's been in hospice care since June 2023 per their report.   Yesterday morning blood was noted in his stool.  They started on antibiotics for possible diverticulitis.  He had more bleeding overnight and was directed to the hospital.  Daughters have noticed he was weak and pale.  He's been admitted twice in the past for bleeding.  No other concerns or complaints.  ED Course: Admit to hospitalist for GI bleeding.  Holding off on transfusion at this time.  Review of Systems: As per HPI otherwise all other systems reviewed and are negative.  Past Medical History:  Diagnosis Date   Alzheimer disease (HCC)    Benign prostatic hyperplasia 01/30/2020   Chronic anticoagulation    On eliquis   Dementia (HCC)    Gait disorder    GI bleed    Hypercholesterolemia    Hypertension    Mixed hyperlipidemia 01/30/2020   Paroxysmal atrial fibrillation (HCC)    TIA (transient ischemic attack) 01/2021    Past Surgical History:  Procedure Laterality Date   BIOPSY  02/05/2020   Procedure: BIOPSY;  Surgeon: Lemar Lofty., MD;  Location: [facility] ENDOSCOPY;  Service: Gastroenterology;;   CHOLECYSTECTOMY     ESOPHAGOGASTRODUODENOSCOPY (EGD) WITH PROPOFOL N/Dason Mosley 02/05/2020   Procedure: ESOPHAGOGASTRODUODENOSCOPY (EGD) WITH PROPOFOL;  Surgeon: Lemar Lofty., MD;  Location: [facility] ENDOSCOPY;  Service: Gastroenterology;  Laterality: N/Elizet Kaplan;   FLEXIBLE SIGMOIDOSCOPY N/Maudy Yonan 02/05/2020    Procedure: FLEXIBLE SIGMOIDOSCOPY;  Surgeon: Meridee Score Netty Starring., MD;  Location: [facility] ENDOSCOPY;  Service: Gastroenterology;  Laterality: N/Lianny Molter;   SAVORY DILATION N/Wisam Siefring 02/05/2020   Procedure: SAVORY DILATION;  Surgeon: Meridee Score Netty Starring., MD;  Location: [facility] ENDOSCOPY;  Service: Gastroenterology;  Laterality: N/Phillipe Clemon;   TOTAL HIP ARTHROPLASTY      Social History  reports that he has never smoked. He has never used smokeless tobacco. He reports that he does not currently use alcohol. He reports that he does not use drugs.  No Known Allergies  History reviewed. No pertinent family history.   Prior to Admission medications   Medication Sig Start Date End Date Taking? Authorizing Provider  acetaminophen (TYLENOL) 325 MG tablet Take 650 mg by mouth every 6 (six) hours as needed for moderate pain or fever.   Yes [provider]  apixaban (ELIQUIS) 5 MG TABS tablet Take 1 tablet (5 mg total) by mouth 2 (two) times daily. 01/17/21  Yes Ghimire, Werner Lean, MD  Emollient Hinton Dyer) LOTN Apply 1 Application topically daily. To legs   Yes [provider]  furosemide (LASIX) 40 MG tablet Take 40 mg by mouth daily.   Yes [provider]  guaifenesin (ROBITUSSIN) 100 MG/5ML syrup Take 200 mg by mouth 2 (two) times daily as needed for cough.   Yes [provider]  LORazepam (ATIVAN) 0.5 MG tablet Take 0.25 mg by mouth 2 (two) times daily as needed for anxiety.   Yes [provider]  metoprolol tartrate (LOPRESSOR) 25 MG tablet Take 1 tablet (  25 mg total) by mouth 2 (two) times daily. 01/17/21 09/28/22 Yes Ghimire, Werner Lean, MD  Olopatadine HCl 0.2 % SOLN Apply 1 drop to eye as needed (Redness/Watery eye).   Yes [provider]  polyethylene glycol (MIRALAX / GLYCOLAX) 17 g packet Take 17 g by mouth daily as needed (for constipation- mix into 4 to 8 ounces of fluid). 02/14/20  Yes [provider]  potassium chloride (KLOR-CON) 10 MEQ tablet Take  20 mEq by mouth daily.   Yes [provider]  senna (SENOKOT) 8.6 MG TABS tablet Take 2 tablets by mouth daily.   Yes [provider]  Skin Protectants, Misc. (BAZA PROTECT EX) Apply 1 Application topically as needed (Irritation).   Yes [provider]  ciprofloxacin (CIPRO) 500 MG tablet Take 500 mg by mouth 2 (two) times daily. For 7 days. Patient not taking: Reported on 09/28/2022    [provider]  metroNIDAZOLE (FLAGYL) 500 MG tablet Take 500 mg by mouth 3 (three) times daily. For 7 days. Patient not taking: Reported on 09/28/2022    [provider]    Physical Exam: Vitals:   09/28/22 1415 09/28/22 1445 09/28/22 1546 09/28/22 1620  BP: 116/70 126/79 120/81 (!) 123/98  Pulse: 83 92 94 68  Resp: 18 (!) 22 19 18   Temp:   97.7 F (36.5 C) 98.5 F (36.9 C)  TempSrc:    Axillary  SpO2: 99% 99% 99% 100%  Weight:    74 kg  Height:        Constitutional: NAD, calm, comfortable Vitals:   09/28/22 1415 09/28/22 1445 09/28/22 1546 09/28/22 1620  BP: 116/70 126/79 120/81 (!) 123/98  Pulse: 83 92 94 68  Resp: 18 (!) 22 19 18   Temp:   97.7 F (36.5 C) 98.5 F (36.9 C)  TempSrc:    Axillary  SpO2: 99% 99% 99% 100%  Weight:    74 kg  Height:       Eyes: PERRL, lids and conjunctivae normal ENMT: Mucous membranes are moist.  Neck: normal, supple Respiratory: clear to auscultation bilaterally, no wheezing, no crackles. Cardiovascular: RRR Abdomen: no tenderness, no masses palpated. No hepatosplenomegaly. Bowel sounds positive.  Musculoskeletal: no clubbing / cyanosis. No joint deformity upper and lower extremities. Good ROM, no contractures. Normal muscle tone.  Skin: pale Neurologic: moving all extremities, pleasantly confused Psychiatric: Normal mood/affect  Labs on Admission: I have personally reviewed following labs and imaging studies  CBC: Recent Labs  Lab 09/28/22 0311 09/28/22 0736 09/28/22 1632  WBC 10.8*  --   --    NEUTROABS 8.6*  --   --   HGB 7.5* 6.7* 7.8*  HCT 24.6* 22.1* 24.5*  MCV 79.6*  --   --   PLT 229  --   --     Basic Metabolic Panel: Recent Labs  Lab 09/28/22 0311  NA 138  K 4.4  CL 106  CO2 21*  GLUCOSE 127*  BUN 59*  CREATININE 2.21*  CALCIUM 8.3*    GFR: Estimated Creatinine Clearance: 24.4 mL/min (Lerry Cordrey) (by C-G formula based on SCr of 2.21 mg/dL (H)).  Liver Function Tests: Recent Labs  Lab 09/28/22 0311  AST 12*  ALT 11  ALKPHOS 64  BILITOT 0.6  PROT 6.7  ALBUMIN 2.9*    Urine analysis:    Component Value Date/Time   COLORURINE AMBER (Shevonne Wolf) 09/04/2021 1327   APPEARANCEUR TURBID (Jawana Reagor) 09/04/2021 1327   LABSPEC 1.010 09/04/2021 1327   PHURINE 6.0 09/04/2021  1327   GLUCOSEU NEGATIVE 09/04/2021 1327   HGBUR MODERATE (Nolyn Swab) 09/04/2021 1327   BILIRUBINUR NEGATIVE 09/04/2021 1327   KETONESUR NEGATIVE 09/04/2021 1327   PROTEINUR 100 (Genisis Sonnier) 09/04/2021 1327   NITRITE NEGATIVE 09/04/2021 1327   LEUKOCYTESUR MODERATE (Pritesh Sobecki) 09/04/2021 1327    Radiological Exams on Admission: No results found.  EKG: Independently reviewed. Atrial fibrillation with RVR  Assessment/Plan Principal Problem:   GI bleed    Assessment and Plan:  Goals of Care Extensive discussion with 2 daughters this morning.  He's enrolled in hospice.  Question at this time is regarding transfusion.  Daughters present [employer] Witnesses so would prefer we try not to get blood, but they acknowledge this may not be his believe and planned discussion with his guardian to make decision.  If no plans to transfuse, comfort measures probably appropriate.  If transfuse and bleeding stops, I expect his life expectancy will probably be similar to whatever it was prior to his bleeding.  Family present appreciated discussion, they'll discuss with guardian and hospice.    After discussion plan to transfuse as needed.  If bleeding stops and Hb/Hct stable, I expect he'll be able to go back to [place] with  hospice.  Acute Blood Loss Anemia Bright Red Blood Per Rectum History Diverticular Bleeding 11.2 08/2021, 7.5 on presentation - suspects this represents Donnella Morford diverticular bleed Downtrending Decision for transfusion after discussion with family Transfuse as needed Hold eliquis, this will be discontinued based on his life threatening bleed at discharge Will continue to trend H/H He was on abx prior to admission for diverticulitis, low suspicion, will stop abx No plans for intervention (GI c/s, CTA - with aki)  Acute Kidney Injury Follow with transfusion Suspect due to acute blood loss  Dementia Wheelchair bound Waxes and wanes Has ativan prn at home Delirium precautions  Hypertension Hold lasix with bleeding Hold metoprolol with acute blood loss anemia  Atrial Fibrillation Metop on hold Discontinue eliquis at discharge     DVT prophylaxis: SCD  Code Status:   DNR  Family Communication:  daughters  Disposition Plan:   Patient is from:  [place]  Anticipated DC to:  [place]  Anticipated DC date:  7/30  Anticipated DC barriers: Pending improvement in bleeding  Consults called:  none  Admission status:  inpatient   Severity of Illness: The appropriate patient status for this patient is INPATIENT. Inpatient status is judged to be reasonable and necessary in order to provide the required intensity of service to ensure the patient's safety. The patient's presenting symptoms, physical exam findings, and initial radiographic and laboratory data in the context of their chronic comorbidities is felt to place them at high risk for further clinical deterioration. Furthermore, it is not anticipated that the patient will be medically stable for discharge from the hospital within 2 midnights of admission.   * I certify that at the point of admission it is my clinical judgment that the patient will require inpatient hospital care spanning beyond 2 midnights from the point of  admission due to high intensity of service, high risk for further deterioration and high frequency of surveillance required.[name] MD [facility]  How to contact the [facility] Attending or Consulting provider 7A - 7P or covering provider during after hours 7P -7A, for this patient?   Check the care team in [facility] and look for Robin Pafford) attending/consulting TRH provider listed and b) the [facility] team listed Log into [email or web address] and use [place]'s universal password  to access. If you do not have the password, please contact the hospital operator. Locate the [facility] provider you are looking for under [facility] and page to Ileen Kahre number that you can be directly reached. If you still have difficulty reaching the provider, please page the [facility] (Director on Call) for the Hospitalists listed on amion for assistance.  09/28/2022, 6:34 PM

## 2022-09-28 NOTE — ED Triage Notes (Signed)
Pt arrives via [facility] from facility, pt has had blood in stool for several days, worse tonight. Pt mentation is at baseline. Pressure 98/76, hr 102. Unable to establish IV en route

## 2022-09-28 NOTE — ED Notes (Signed)
ED TO INPATIENT HANDOFF REPORT  ED Nurse Name and Phone #: Grover Canavan [phone]  R Name/Age/Gender Ryan [name] 88 y.o. male Room/Bed: 007C/007C  Code Status   Code Status: DNR  Home/SNF/Other Skilled nursing facility Patient oriented to: self Is this baseline? Yes   Triage Complete: Triage complete  Chief Complaint GI bleed [K92.2]  Triage Note Pt arrives via [facility] from facility, pt has had blood in stool for several days, worse tonight. Pt mentation is at baseline. Pressure 98/76, hr 102. Unable to establish IV en route   Allergies No Known Allergies  Level of Care/Admitting Diagnosis ED Disposition     ED Disposition  Admit   Condition  --   Comment  Hospital Area: [facility] [[id number]]  Level of Care: Med-Surg [16]  May admit patient to [facility] or [facility] if equivalent level of care is available:: No  Covid Evaluation: Asymptomatic - no recent exposure (last 10 days) testing not required  Diagnosis: GI bleed [604540]  Admitting Physician: Zigmund Daniel [phone]  Attending Physician: Shaune Spittle, Vanice Sarah [phone]  Certification:: I certify this patient will need inpatient services for at least 2 midnights          B Medical/Surgery History Past Medical History:  Diagnosis Date   Alzheimer disease (HCC)    Benign prostatic hyperplasia 01/30/2020   Chronic anticoagulation    On eliquis   Dementia (HCC)    Gait disorder    GI bleed    Hypercholesterolemia    Hypertension    Mixed hyperlipidemia 01/30/2020   Paroxysmal atrial fibrillation (HCC)    TIA (transient ischemic attack) 01/2021   Past Surgical History:  Procedure Laterality Date   BIOPSY  02/05/2020   Procedure: BIOPSY;  Surgeon: Lemar Lofty., MD;  Location: [facility] ENDOSCOPY;  Service: Gastroenterology;;   CHOLECYSTECTOMY     ESOPHAGOGASTRODUODENOSCOPY (EGD) WITH PROPOFOL N/A 02/05/2020   Procedure: ESOPHAGOGASTRODUODENOSCOPY (EGD) WITH PROPOFOL;   Surgeon: Lemar Lofty., MD;  Location: [facility] ENDOSCOPY;  Service: Gastroenterology;  Laterality: N/A;   FLEXIBLE SIGMOIDOSCOPY N/A 02/05/2020   Procedure: FLEXIBLE SIGMOIDOSCOPY;  Surgeon: Meridee Score Netty Starring., MD;  Location: [facility] ENDOSCOPY;  Service: Gastroenterology;  Laterality: N/A;   SAVORY DILATION N/A 02/05/2020   Procedure: SAVORY DILATION;  Surgeon: Meridee Score Netty Starring., MD;  Location: [facility] ENDOSCOPY;  Service: Gastroenterology;  Laterality: N/A;   TOTAL HIP ARTHROPLASTY       A IV Location/Drains/Wounds Patient Lines/Drains/Airways Status     Active Line/Drains/Airways     Name Placement date Placement time Site Days   Peripheral IV 09/28/22 20 G 1" Anterior;Right;Upper Arm 09/28/22  0310  Arm  less than 1   Pressure Injury 01/31/20 Leg Left 2 full thickness wounds to left upper thigh 01/31/20  0625  -- 971            Intake/Output Last 24 hours  Intake/Output Summary (Last 24 hours) at 09/28/2022 1410 Last data filed at 09/28/2022 0418 Gross per 24 hour  Intake 500 ml  Output --  Net 500 ml    Labs/Imaging Results for orders placed or performed during the hospital encounter of 09/28/22 (from the past 48 hour(s))  CBC with Differential/Platelet     Status: Abnormal   Collection Time: 09/28/22  3:11 AM  Result Value Ref Range   WBC 10.8 (H) 4.0 - 10.5 K/uL   RBC 3.09 (L) 4.22 - 5.81 MIL/uL   Hemoglobin 7.5 (L) 13.0 - 17.0 g/dL   HCT  24.6 (L) 39.0 - 52.0 %   MCV 79.6 (L) 80.0 - 100.0 fL   MCH 24.3 (L) 26.0 - 34.0 pg   MCHC 30.5 30.0 - 36.0 g/dL   RDW 87.5 (H) 64.3 - 32.9 %   Platelets 229 150 - 400 K/uL   nRBC 0.0 0.0 - 0.2 %   Neutrophils Relative % 80 %   Neutro Abs 8.6 (H) 1.7 - 7.7 K/uL   Lymphocytes Relative 9 %   Lymphs Abs 1.0 0.7 - 4.0 K/uL   Monocytes Relative 10 %   Monocytes Absolute 1.1 (H) 0.1 - 1.0 K/uL   Eosinophils Relative 0 %   Eosinophils Absolute 0.0 0.0 - 0.5 K/uL   Basophils Relative 0 %   Basophils Absolute 0.0 0.0 - 0.1  K/uL   Immature Granulocytes 1 %   Abs Immature Granulocytes 0.12 (H) 0.00 - 0.07 K/uL    Comment: Performed at [facility], [address] [place], [place] [address]  Comprehensive metabolic panel     Status: Abnormal   Collection Time: 09/28/22  3:11 AM  Result Value Ref Range   Sodium 138 135 - 145 mmol/L   Potassium 4.4 3.5 - 5.1 mmol/L   Chloride 106 98 - 111 mmol/L   CO2 21 (L) 22 - 32 mmol/L   Glucose, Bld 127 (H) 70 - 99 mg/dL    Comment: Glucose reference range applies only to samples taken after fasting for at least 8 hours.   BUN 59 (H) 8 - 23 mg/dL   Creatinine, Ser 1.66 (H) 0.61 - 1.24 mg/dL   Calcium 8.3 (L) 8.9 - 10.3 mg/dL   Total Protein 6.7 6.5 - 8.1 g/dL   Albumin 2.9 (L) 3.5 - 5.0 g/dL   AST 12 (L) 15 - 41 U/L   ALT 11 0 - 44 U/L   Alkaline Phosphatase 64 38 - 126 U/L   Total Bilirubin 0.6 0.3 - 1.2 mg/dL   GFR, Estimated 28 (L) >60 mL/min    Comment: (NOTE) Calculated using the CKD-EPI Creatinine Equation (2021)    Anion gap 11 5 - 15    Comment: Performed at [facility], [address], [place], [place] [address]  Protime-INR     Status: Abnormal   Collection Time: 09/28/22  3:11 AM  Result Value Ref Range   Prothrombin Time 17.6 (H) 11.4 - 15.2 seconds   INR 1.4 (H) 0.8 - 1.2    Comment: (NOTE) INR goal varies based on device and disease states. Performed at [facility], [address], [place], [place] [address]   Type and screen     Status: None (Preliminary result)   Collection Time: 09/28/22  3:11 AM  Result Value Ref Range   ABO/RH(D) O POS    Antibody Screen NEG    Sample Expiration 10/01/2022,2359    Unit Number [id number]    Blood Component Type RBC LR PHER2    Unit division 00    Status of Unit ISSUED    Transfusion Status OK TO TRANSFUSE    Crossmatch Result      Compatible Performed at [facility], [address] [place], [place] [address]   Iron and TIBC     Status: Abnormal   Collection Time:  09/28/22  3:11 AM  Result Value Ref Range   Iron 13 (L) 45 - 182 ug/dL   TIBC 376 283 - 151 ug/dL   Saturation Ratios 4 (L) 17.9 -  39.5 %   UIBC 308 ug/dL    Comment: Performed at [facility], [address] [place], [place] [address]  I-Stat CG4 Lactic Acid     Status: None   Collection Time: 09/28/22  3:26 AM  Result Value Ref Range   Lactic Acid, Venous 0.9 0.5 - 1.9 mmol/L  Hemoglobin and hematocrit, blood     Status: Abnormal   Collection Time: 09/28/22  7:36 AM  Result Value Ref Range   Hemoglobin 6.7 (LL) 13.0 - 17.0 g/dL    Comment: REPEATED TO VERIFY THIS CRITICAL RESULT HAS VERIFIED AND BEEN CALLED TO E ANELLO RN BY DANIELLE LONG ON 07 28 2024 AT 0837, AND HAS BEEN READ BACK.     HCT 22.1 (L) 39.0 - 52.0 %    Comment: Performed at [facility], [address] [place], [place] [address]  Prepare RBC (crossmatch)     Status: None   Collection Time: 09/28/22 12:47 PM  Result Value Ref Range   Order Confirmation      ORDERS RECEIVED TO CROSSMATCH Performed at [facility], [address], [place], [place] [address]    No results found.  Pending Labs Unresulted Labs (From admission, onward)     Start     Ordered   09/28/22 0800  Hemoglobin and hematocrit, blood  Now then every 8 hours,   R (with TIMED occurrences)      09/28/22 0456            Vitals/Pain Today's Vitals   09/28/22 1000 09/28/22 1008 09/28/22 1312 09/28/22 1329  BP: 114/66  113/73 117/71  Pulse: (!) 101  97 90  Resp: 19  18 17   Temp:  97.9 F (36.6 C) 98.3 F (36.8 C) 98.1 F (36.7 C)  TempSrc:  Oral Axillary Axillary  SpO2: 100%  99% 97%  Weight:      Height:      PainSc:        Isolation Precautions No active isolations  Medications Medications  LORazepam (ATIVAN) tablet 0.25 mg (has no administration in time range)  sodium chloride 0.9 % bolus 500 mL (0 mLs Intravenous Stopped 09/28/22 0418)  0.9 %  sodium chloride infusion (Manually program via Guardrails  IV Fluids) ( Intravenous New Bag/Given 09/28/22 1312)    Mobility non-ambulatory     Focused Assessments G/u   R Recommendations: See Admitting Provider Note  Report given to:   Additional Notes:  blood transfusion at 

## 2022-09-28 NOTE — Progress Notes (Signed)
Ryan [name] ED- [facility] H[facility]ice Liaison Note  Ryan [name] is a current hospice patient followed at [facility] for terminal diagnosis Alzheimer's.  Patient was taken to the ED for evaluation of complaint of dark red blood from patient's rectum for 1-2 days.  [facility] was not notified or contacted by the facility or family.  Received notification of patient status by Dr. [name] at [facility] ED.  Patient admitted to [facility] 7.28.24 with GI Bleed.  Per Dr. Kirt Boys with [facility], this is a related hospital admission.   Patient in ED at [facility].  Patient continues to have dark red stools with clots reported in the ED.  Patient has completed 1unit PRBC in ED and is awaiting transfer to floor bed. Patient is post speech evaluation and recommends Dysphagia 1 (Puree) diet.  Patient remains GIP appropriate due to need for IV blood transfusions, with frequent skilled assessment to monitor effects of treatments.  Vital Signs:  T97.3, BP 123/87, P 78, R 21  Oxi 98% on RA Abnormal labs: HGB 7.5 down to 6.7 Diagnostics:  None  IV/PRN Meds:  PRBC transfusion    WBC 10.8 (*)       RBC 3.09 (*)      Hemoglobin 7.5 (*)      HCT 24.6 (*)      MCV 79.6 (*)      MCH 24.3 (*)      RDW 18.4 (*)      Neutro Abs 8.6 (*)      Monocytes Absolute 1.1 (*)      Abs Immature Granulocytes 0.12 (*)      All other components within normal limits  COMPREHENSIVE METABOLIC PANEL - Abnormal; Notable for the following components:    CO2 21 (*)      Glucose, Bld 127 (*)      BUN 59 (*)      Creatinine, Ser 2.21 (*)      Calcium 8.3 (*)      Albumin 2.9 (*)      AST 12 (*)      GFR, Estimated 28 (*)      All other components within normal limits  PROTIME-INR - Abnormal; Notable for the following components:    Prothrombin Time 17.6 (*)      INR 1.4 (*)      All other components within normal limits  I-STAT CG4 LACTIC ACID, ED  I-STAT CG4 LACTIC ACID,  ED  TYPE AND SCREEN      EKG EKG Interpretation Date/Time:                  Sunday September 28 2022 03:09:02 EDT Ventricular Rate:         119 PR Interval:                   QRS Duration:             85 QT Interval:                 358 QTC Calculation:504 R Axis:                         53    Text Interpretation:Atrial fibrillation Ventricular premature complex Low voltage, precordial leads Borderline repolarization abnormality Prolonged QT interval No significant change since last tracing Confirmed by Gilda Crease [phone]) on 09/28/2022 3:34:34 AM   Radiology- None  Problem list:  Rectal Bleeding- transfuse as needed.   History of GI bleeds AKI- acute kidney injury BUN 59, Creat 2.21, GFR 28  Discharge Planning:  Ongoing-  Family contact:  None at bedside.   IDT:  Updated   Goals of Care- DNR Day 1 only:  Medication list and Transfer Summary pasted into epic note.  Norris Cross, RN Nurse Liaison [phone]

## 2022-09-28 NOTE — ED Notes (Signed)
Pt linen and gown changed.

## 2022-09-28 NOTE — ED Notes (Signed)
Soiled brief changed.

## 2022-09-28 NOTE — ED Provider Notes (Signed)
[place] EMERGENCY DEPARTMENT AT [facility] Provider Note   CSN: [id number] Arrival date & time: 09/28/22  0244     History  Chief Complaint  Patient presents with   Rectal Bleeding    Ryan [name] is a 88 y.o. male.  Presents to the emergency department for evaluation of GI bleeding.  Patient has had dark red blood per rectum for 1 to 2 days.  Hospice was contacted and his Eliquis was discontinued yesterday.  Patient continuing to bleed.  He does have a history of Alzheimer's disease, cannot provide much more information.  He does indicate that he is not experiencing any pain currently.       Home Medications Prior to Admission medications   Medication Sig Start Date End Date Taking? Authorizing Provider  apixaban (ELIQUIS) 5 MG TABS tablet Take 1 tablet (5 mg total) by mouth 2 (two) times daily. 01/17/21   Ghimire, Werner Lean, MD  atorvastatin (LIPITOR) 40 MG tablet Take 40 mg by mouth daily.    [provider]  Emollient (LUBRIDERM SERIOUSLY SENSITIVE) LOTN Apply 1 application. topically daily.    [provider]  ferrous sulfate 325 (65 FE) MG EC tablet Take 325 mg by mouth 2 (two) times a week.    [provider]  furosemide (LASIX) 20 MG tablet Take 1 tablet (20 mg total) by mouth every Monday, Wednesday, and Friday. 07/22/21   Rolly Salter, MD  LORazepam (ATIVAN) 0.5 MG tablet Take 0.5 tablets (0.25 mg total) by mouth 2 (two) times daily as needed for anxiety. 07/21/21   Rolly Salter, MD  metoprolol tartrate (LOPRESSOR) 25 MG tablet Take 1 tablet (25 mg total) by mouth 2 (two) times daily. 01/17/21 07/18/21  Ghimire, Werner Lean, MD  Multiple Vitamin (MULTIVITAMIN) tablet Take 1 tablet by mouth daily with breakfast.    [provider]  pantoprazole (PROTONIX) 40 MG tablet Take 1 tablet (40 mg total) by mouth daily. 02/07/21   Meredith Pel, NP  polyethylene glycol (MIRALAX / GLYCOLAX) 17 g packet Take 17 g by mouth daily  as needed (for constipation- mix into 4 to 8 ounces of fluid). 02/14/20   [provider]  polyethylene glycol (MIRALAX) 17 g packet Take 17 g by mouth daily. 07/21/21   Rolly Salter, MD  Probiotic Product (PROBIOTIC ADVANCED PO) Take 500 mg by mouth daily in the afternoon.    [provider]  tamsulosin (FLOMAX) 0.4 MG CAPS capsule Take 0.4 mg by mouth in the morning.    [provider]      Allergies    Patient has no known allergies.    Review of Systems   Review of Systems  Physical Exam Updated Vital Signs BP 123/87   Pulse 77   Temp (!) 97.3 F (36.3 C) (Oral)   Resp (!) 21   Ht 5\' 9"  (1.753 m)   Wt 74.6 kg   SpO2 98%   BMI 24.29 kg/m  Physical Exam Vitals and nursing note reviewed.  Constitutional:      General: He is not in acute distress.    Appearance: He is well-developed.  HENT:     Head: Normocephalic and atraumatic.     Mouth/Throat:     Mouth: Mucous membranes are moist.  Eyes:     General: Vision grossly intact. Gaze aligned appropriately.     Extraocular Movements: Extraocular movements intact.     Conjunctiva/sclera: Conjunctivae normal.  Cardiovascular:  Rate and Rhythm: Normal rate and regular rhythm.     Pulses: Normal pulses.     Heart sounds: Normal heart sounds, S1 normal and S2 normal. No murmur heard.    No friction rub. No gallop.  Pulmonary:     Effort: Pulmonary effort is normal. No respiratory distress.     Breath sounds: Normal breath sounds.  Abdominal:     Palpations: Abdomen is soft.     Tenderness: There is no abdominal tenderness. There is no guarding or rebound.     Hernia: No hernia is present.  Musculoskeletal:        General: No swelling.     Cervical back: Full passive range of motion without pain, normal range of motion and neck supple. No pain with movement, spinous process tenderness or muscular tenderness. Normal range of motion.     Right lower leg: No edema.     Left lower leg: No  edema.  Skin:    General: Skin is warm and dry.     Capillary Refill: Capillary refill takes less than 2 seconds.     Findings: No ecchymosis, erythema, lesion or wound.  Neurological:     Mental Status: He is alert and oriented to person, place, and time.     GCS: GCS eye subscore is 4. GCS verbal subscore is 5. GCS motor subscore is 6.     Cranial Nerves: Cranial nerves 2-12 are intact.     Sensory: Sensation is intact.     Motor: Motor function is intact. No weakness or abnormal muscle tone.     Coordination: Coordination is intact.  Psychiatric:        Mood and Affect: Mood normal.        Speech: Speech normal.        Behavior: Behavior normal.     ED Results / Procedures / Treatments   Labs (all labs ordered are listed, but only abnormal results are displayed) Labs Reviewed  CBC WITH DIFFERENTIAL/PLATELET - Abnormal; Notable for the following components:      Result Value   WBC 10.8 (*)    RBC 3.09 (*)    Hemoglobin 7.5 (*)    HCT 24.6 (*)    MCV 79.6 (*)    MCH 24.3 (*)    RDW 18.4 (*)    Neutro Abs 8.6 (*)    Monocytes Absolute 1.1 (*)    Abs Immature Granulocytes 0.12 (*)    All other components within normal limits  COMPREHENSIVE METABOLIC PANEL - Abnormal; Notable for the following components:   CO2 21 (*)    Glucose, Bld 127 (*)    BUN 59 (*)    Creatinine, Ser 2.21 (*)    Calcium 8.3 (*)    Albumin 2.9 (*)    AST 12 (*)    GFR, Estimated 28 (*)    All other components within normal limits  PROTIME-INR - Abnormal; Notable for the following components:   Prothrombin Time 17.6 (*)    INR 1.4 (*)    All other components within normal limits  I-STAT CG4 LACTIC ACID, ED  I-STAT CG4 LACTIC ACID, ED  TYPE AND SCREEN    EKG EKG Interpretation Date/Time:  Sunday September 28 2022 03:09:02 EDT Ventricular Rate:  119 PR Interval:    QRS Duration:  85 QT Interval:  358 QTC Calculation: 504 R Axis:   53  Text Interpretation: Atrial fibrillation Ventricular  premature complex Low voltage, precordial leads Borderline repolarization abnormality Prolonged  QT interval No significant change since last tracing Confirmed by Gilda Crease [phone]) on 09/28/2022 3:34:34 AM  Radiology No results found.  Procedures Procedures    Medications Ordered in ED Medications  sodium chloride 0.9 % bolus 500 mL (0 mLs Intravenous Stopped 09/28/22 0418)    ED Course/ Medical Decision Making/ A&P                             Medical Decision Making Amount and/or Complexity of Data Reviewed External Data Reviewed: notes.    Details: Lower GI bleed in 2021 - flex sig and EGD showed bleeding likely diverticular in nature Labs: ordered. Decision-making details documented in ED Course.   Differential Diagnosis considered includes, but not limited to: colitis; diverticulitis; diverticulosis bleeding; AVM  Patient presents to the emergency department for evaluation of rectal bleeding.  He does have a history of recurrent diverticular bleeding in the past.  He is on Eliquis because of atrial fibrillation.  His last dose was in the evening on July 26, both doses yesterday were held.  Patient with increased bleeding tonight.  Vitals unremarkable, no sign of shock.  Hemoglobin is reduced at 7.5, baseline around 11.  Patient continues to have some bloody output here in the ED.  Family at bedside.  Discussed the possibility of initiating blood transfusion now, as he likely will drop further.  Family is hesitant, as he is currently hospice and they are trying to minimize care.  They would like to monitor the hemoglobin further and make a decision on transfusion if he significantly drops further.  It seems that in the past he has been able to avoid transfusions and he would like to see if the hemoglobin stabilizes.         Final Clinical Impression(s) / ED Diagnoses Final diagnoses:  Rectal bleeding  AKI (acute kidney injury) [facility])    Rx / DC Orders ED  Discharge Orders     None         Mat Stuard, Canary Brim, MD 09/28/22 [phone]

## 2022-09-28 NOTE — Progress Notes (Addendum)
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 MSW spoke with LG/Dave and denies any needs at this time. Per Dr. Lowell Guitar, plan is for patient to transfer back to facility once stable.    Please call with any questions/concerns.    Thank you for the opportunity to participate in this patient's care.  Odette Fraction, MSW [facility] Liaison  [phone]

## 2022-09-28 NOTE — ED Notes (Signed)
Pt cleaned and changed by this RN and Lyda Perone (ED Medic).  Stool dark red in color; large number of clots.  Pt placed in brief and linens changed.

## 2022-09-28 NOTE — Evaluation (Signed)
Clinical/Bedside Swallow Evaluation Patient Details  Name: Ryan [name] MRN: [id number] Date of Birth: 1934/12/09  Today's Date: 09/28/2022 Time: SLP Start Time (ACUTE ONLY): 1434 SLP Stop Time (ACUTE ONLY): 1524 SLP Time Calculation (min) (ACUTE ONLY): 50 min  Past Medical History:  Past Medical History:  Diagnosis Date   Alzheimer disease (HCC)    Benign prostatic hyperplasia 01/30/2020   Chronic anticoagulation    On eliquis   Dementia (HCC)    Gait disorder    GI bleed    Hypercholesterolemia    Hypertension    Mixed hyperlipidemia 01/30/2020   Paroxysmal atrial fibrillation (HCC)    TIA (transient ischemic attack) 01/2021   Past Surgical History:  Past Surgical History:  Procedure Laterality Date   BIOPSY  02/05/2020   Procedure: BIOPSY;  Surgeon: Lemar Lofty., MD;  Location: [facility] ENDOSCOPY;  Service: Gastroenterology;;   CHOLECYSTECTOMY     ESOPHAGOGASTRODUODENOSCOPY (EGD) WITH PROPOFOL N/A 02/05/2020   Procedure: ESOPHAGOGASTRODUODENOSCOPY (EGD) WITH PROPOFOL;  Surgeon: Lemar Lofty., MD;  Location: [facility] ENDOSCOPY;  Service: Gastroenterology;  Laterality: N/A;   FLEXIBLE SIGMOIDOSCOPY N/A 02/05/2020   Procedure: FLEXIBLE SIGMOIDOSCOPY;  Surgeon: Meridee Score Netty Starring., MD;  Location: [facility] ENDOSCOPY;  Service: Gastroenterology;  Laterality: N/A;   SAVORY DILATION N/A 02/05/2020   Procedure: SAVORY DILATION;  Surgeon: Meridee Score Netty Starring., MD;  Location: [facility] ENDOSCOPY;  Service: Gastroenterology;  Laterality: N/A;   TOTAL HIP ARTHROPLASTY     HPI:  Pt is an 88 yo male presenting 7/28 with concern for GIB. Pt was noted to have difficulty swallowing water in the ED and SLP swallow eval was ordered. CXR without significant acute changes. Pt is under hospice care for alzheimer's disease. PMH also includes: TIA, gait disorder, EGD with dilation    Assessment / Plan / Recommendation  Clinical Impression  Pt presents with evidence of a cognitively-based  dysphagia, including reduced awareness when transitioning between different bolus consistencies and deliveries (spoon, straw). He initially was resistant to SLP trying to perform oral care or offer POs, but this improved as the session continued and a small amount of thick secretions were removed from his oral cavity. Across most of a cup of applesauce and most of a cup of water, he had only two instance of coughing: one delayed but one immediately following a larger sip of thin liquid. No further signs of possible reduced airway protection were noted with SLP providing assistance to limit bolus size, monitor pacing, and offering liquid washes to clear lingual residue. Discussed presentation with his two daughters present: he does have signs of dysphagia including signs of potential aspiration. Pt receives hospice servives and daughters are mindful of what testing they want to pursue. They are aware of MBS as an option, but note that his participation through the test could be limited and with his underlying dementia his swallowing could continue to fluctuate and ultimately decline. At this time they would like to initiate a modified diet of purees and thin liquids with use of careful hand feeding, acknowledging his risk for aspiration but trying to reduce it as much as possible. Education was reviewed about swallowing strategies and precautions, as well as the importance of oral care to keep oral secretions managed. SLP will continue to follow acutely. SLP Visit Diagnosis: Dysphagia, unspecified (R13.10)    Aspiration Risk  Moderate aspiration risk;Risk for inadequate nutrition/hydration    Diet Recommendation Dysphagia 1 (Puree);Thin liquid    Liquid Administration via: Cup;Straw Medication Administration: Crushed with puree Supervision:  Staff to assist with self feeding;Full supervision/cueing for compensatory strategies Compensations: Minimize environmental distractions;Slow rate;Small  sips/bites;Follow solids with liquid Postural Changes: Seated upright at 90 degrees;Remain upright for at least 30 minutes after po intake    Other  Recommendations Oral Care Recommendations: Oral care QID Caregiver Recommendations: Have oral suction available    Recommendations for follow up therapy are one component of a multi-disciplinary discharge planning process, led by the attending physician.  Recommendations may be updated based on patient status, additional functional criteria and insurance authorization.  Follow up Recommendations Other (comment) (f/u upon return to [facility] if eligible)      Assistance Recommended at Discharge    Functional Status Assessment Patient has had a recent decline in their functional status and/or demonstrates limited ability to make significant improvements in function in a reasonable and predictable amount of time  Frequency and Duration min 2x/week  2 weeks       Prognosis Prognosis for improved oropharyngeal function: Fair Barriers to Reach Goals: Cognitive deficits      Swallow Study   General HPI: Pt is an 88 yo male presenting 7/28 with concern for GIB. Pt was noted to have difficulty swallowing water in the ED and SLP swallow eval was ordered. CXR without significant acute changes. Pt is under hospice care for alzheimer's disease. PMH also includes: TIA, gait disorder, EGD with dilation Type of Study: Bedside Swallow Evaluation Previous Swallow Assessment: none in chart Diet Prior to this Study: NPO Temperature Spikes Noted: No Respiratory Status: Room air History of Recent Intubation: No Behavior/Cognition: Alert;Requires cueing Oral Cavity Assessment: Excessive secretions Oral Care Completed by SLP: Yes Oral Cavity - Dentition: Dentures, top (natural lower dentition) Vision: Functional for self-feeding Self-Feeding Abilities: Total assist Patient Positioning: Upright in bed Baseline Vocal Quality: Normal     Oral/Motor/Sensory Function Overall Oral Motor/Sensory Function:  (deferred based on cooperation and command following)   Ice Chips Ice chips: Within functional limits Presentation: Spoon   Thin Liquid Thin Liquid: Impaired Presentation: Straw Oral Phase Impairments: Poor awareness of bolus Pharyngeal  Phase Impairments: Cough - Immediate    Nectar Thick Nectar Thick Liquid: Not tested   Honey Thick Honey Thick Liquid: Not tested   Puree Puree: Impaired Presentation: Spoon Oral Phase Impairments: Poor awareness of bolus Oral Phase Functional Implications: Oral residue Pharyngeal Phase Impairments: Cough - Delayed   Solid     Solid: Not tested      Mahala Menghini., M.A. CCC-SLP Acute Rehabilitation Services Office [phone]  Secure chat preferred  09/28/2022,4:06 PM

## 2022-09-28 NOTE — ED Notes (Signed)
Dr. Lowell Guitar notified about Hgb of 6.7.

## 2022-09-28 NOTE — ED Notes (Signed)
Verbal Consent for blood was obtained over the phone with patients legal guardian. [place] Grose RN and Danelle Berry both signed the verbal consent form.

## 2022-09-29 DIAGNOSIS — K625 Hemorrhage of anus and rectum: Secondary | ICD-10-CM | POA: Diagnosis not present

## 2022-09-29 LAB — BASIC METABOLIC PANEL
Anion gap: 8 (ref 5–15)
BUN: 53 mg/dL — ABNORMAL HIGH (ref 8–23)
CO2: 22 mmol/L (ref 22–32)
Calcium: 8 mg/dL — ABNORMAL LOW (ref 8.9–10.3)
Chloride: 110 mmol/L (ref 98–111)
Creatinine, Ser: 1.96 mg/dL — ABNORMAL HIGH (ref 0.61–1.24)
GFR, Estimated: 33 mL/min — ABNORMAL LOW (ref >60)
Glucose, Bld: 108 mg/dL — ABNORMAL HIGH (ref 70–99)
Potassium: 3.8 mmol/L (ref 3.5–5.1)
Sodium: 140 mmol/L (ref 135–145)

## 2022-09-29 LAB — CBC
HCT: 24.1 % — ABNORMAL LOW (ref 39.0–52.0)
Hemoglobin: 7.6 g/dL — ABNORMAL LOW (ref 13.0–17.0)
MCH: 25.6 pg — ABNORMAL LOW (ref 26.0–34.0)
MCHC: 31.5 g/dL (ref 30.0–36.0)
MCV: 81.1 fL (ref 80.0–100.0)
Platelets: 202 10*3/uL (ref 150–400)
RBC: 2.97 MIL/uL — ABNORMAL LOW (ref 4.22–5.81)
RDW: 18 % — ABNORMAL HIGH (ref 11.5–15.5)
WBC: 7.8 10*3/uL (ref 4.0–10.5)
nRBC: 0 % (ref 0.0–0.2)

## 2022-09-29 LAB — PHOSPHORUS: Phosphorus: 4 mg/dL (ref 2.5–4.6)

## 2022-09-29 LAB — MAGNESIUM: Magnesium: 2.3 mg/dL (ref 1.7–2.4)

## 2022-09-29 LAB — PREPARE RBC (CROSSMATCH)

## 2022-09-29 LAB — HEMOGLOBIN AND HEMATOCRIT, BLOOD
HCT: 21.1 % — ABNORMAL LOW (ref 39.0–52.0)
Hemoglobin: 6.5 g/dL — CL (ref 13.0–17.0)

## 2022-09-29 MED ORDER — PANTOPRAZOLE 80MG IVPB - SIMPLE MED
80.0000 mg | Freq: Once | INTRAVENOUS | Status: AC
  Administered 2022-09-29: 80 mg via INTRAVENOUS
  Filled 2022-09-29: qty 100

## 2022-09-29 MED ORDER — PANTOPRAZOLE INFUSION (NEW) - SIMPLE MED
8.0000 mg/h | INTRAVENOUS | Status: AC
  Administered 2022-09-29 – 2022-10-02 (×7): 8 mg/h via INTRAVENOUS
  Filled 2022-09-29 (×8): qty 100

## 2022-09-29 MED ORDER — SODIUM CHLORIDE 0.9% IV SOLUTION: Freq: Once | INTRAVENOUS | Status: AC

## 2022-09-29 MED ORDER — PANTOPRAZOLE SODIUM 40 MG IV SOLR
40.0000 mg | Freq: Two times a day (BID) | INTRAVENOUS | Status: DC
  Filled 2022-09-29: qty 10

## 2022-09-29 NOTE — Progress Notes (Signed)
Pt had a MEWS score of 2 at approx 1600.  This RN to pt's room to assess.  Pt found to be incont of a very large, dark red liquid BM.  Incont/peri care provided.  Repeat BP soft.  Yellow MEWS guidelines implemented.  Repeat H&H also resulted at approx 1745.  Dr. Lowell Guitar made aware of above.  Order rcd to transfuse PRBC, consent in chart.  Daughter at the bedside, updated on POC.  PRBC transfusion initiated at 1824, this RN at pt's bedside.  Pre transfusion BP low, repeat BP 15 mins after blood transfusion 85/55.  Message to Dr. Lowell Guitar at this time

## 2022-09-29 NOTE — H&P (Signed)
History and Physical    [name]:[id number] DOB: 11/13/1934 DOA: 09/28/2022  PCP: Patient, No Pcp Per  Patient coming from: [place]  I have personally briefly reviewed patient's old medical records in [facility]  Chief Complaint: BRBPR  HPI: Ryan [name] is Ryan [name] 88 y.o. male with medical history significant of demntia, atrial fibrilaltion on eliquis, hx GI bleed, BPH and multiple other medical problems presenting with bright red blood per rectum.  History with assistance from daughters given dementia.    He's been in hospice care since June 2023 per their report.   Yesterday morning blood was noted in his stool.  They started on antibiotics for possible diverticulitis.  He had more bleeding overnight and was directed to the hospital.  Daughters have noticed he was weak and pale.  He's been admitted twice in the past for bleeding.  No other concerns or complaints.  ED Course: Admit to hospitalist for GI bleeding.  Holding off on transfusion at this time.  Review of Systems: As per HPI otherwise all other systems reviewed and are negative.  Past Medical History:  Diagnosis Date   Alzheimer disease (HCC)    Benign prostatic hyperplasia 01/30/2020   Chronic anticoagulation    On eliquis   Dementia (HCC)    Gait disorder    GI bleed    Hypercholesterolemia    Hypertension    Mixed hyperlipidemia 01/30/2020   Paroxysmal atrial fibrillation (HCC)    TIA (transient ischemic attack) 01/2021    Past Surgical History:  Procedure Laterality Date   BIOPSY  02/05/2020   Procedure: BIOPSY;  Surgeon: Lemar Lofty., MD;  Location: [facility] ENDOSCOPY;  Service: Gastroenterology;;   CHOLECYSTECTOMY     ESOPHAGOGASTRODUODENOSCOPY (EGD) WITH PROPOFOL N/Keiry Kowal 02/05/2020   Procedure: ESOPHAGOGASTRODUODENOSCOPY (EGD) WITH PROPOFOL;  Surgeon: Lemar Lofty., MD;  Location[facility] ENDOSCOPY;  Service: Gastroenterology;  Laterality: N/Fransisca Shawn;   FLEXIBLE SIGMOIDOSCOPY N/Amarii Bordas 02/05/2020    Procedure: FLEXIBLE SIGMOIDOSCOPY;  Surgeon: Meridee Score Netty Starring., MD;  Location: [facility] ENDOSCOPY;  Service: Gastroenterology;  Laterality: N/Keynan Heffern;   SAVORY DILATION N/Priest Lockridge 02/05/2020   Procedure: SAVORY DILATION;  Surgeon: Meridee Score Netty Starring., MD;  Location: [facility] ENDOSCOPY;  Service: Gastroenterology;  Laterality: N/Myosha Cuadras;   TOTAL HIP ARTHROPLASTY      Social History  reports that he has never smoked. He has never used smokeless tobacco. He reports that he does not currently use alcohol. He reports that he does not use drugs.  No Known Allergies  History reviewed. No pertinent family history.   Prior to Admission medications   Medication Sig Start Date End Date Taking? Authorizing Provider  acetaminophen (TYLENOL) 325 MG tablet Take 650 mg by mouth every 6 (six) hours as needed for moderate pain or fever.   Yes [provider]  apixaban (ELIQUIS) 5 MG TABS tablet Take 1 tablet (5 mg total) by mouth 2 (two) times daily. 01/17/21  Yes Ghimire, Werner Lean, MD  Emollient Hinton Dyer) LOTN Apply 1 Application topically daily. To legs   Yes [provider]  furosemide (LASIX) 40 MG tablet Take 40 mg by mouth daily.   Yes [provider]  guaifenesin (ROBITUSSIN) 100 MG/5ML syrup Take 200 mg by mouth 2 (two) times daily as needed for cough.   Yes [provider]  LORazepam (ATIVAN) 0.5 MG tablet Take 0.25 mg by mouth 2 (two) times daily as needed for anxiety.   Yes [provider]  metoprolol tartrate (LOPRESSOR) 25 MG tablet Take 1 tablet (  25 mg total) by mouth 2 (two) times daily. 01/17/21 09/28/22 Yes Ghimire, Werner Lean, MD  Olopatadine HCl 0.2 % SOLN Apply 1 drop to eye as needed (Redness/Watery eye).   Yes [provider]  polyethylene glycol (MIRALAX / GLYCOLAX) 17 g packet Take 17 g by mouth daily as needed (for constipation- mix into 4 to 8 ounces of fluid). 02/14/20  Yes [provider]  potassium chloride (KLOR-CON) 10 MEQ tablet Take  20 mEq by mouth daily.   Yes [provider]  senna (SENOKOT) 8.6 MG TABS tablet Take 2 tablets by mouth daily.   Yes [provider]  Skin Protectants, Misc. (BAZA PROTECT EX) Apply 1 Application topically as needed (Irritation).   Yes [provider]  ciprofloxacin (CIPRO) 500 MG tablet Take 500 mg by mouth 2 (two) times daily. For 7 days. Patient not taking: Reported on 09/28/2022    [provider]  metroNIDAZOLE (FLAGYL) 500 MG tablet Take 500 mg by mouth 3 (three) times daily. For 7 days. Patient not taking: Reported on 09/28/2022    [provider]    Physical Exam: Vitals:   09/28/22 1620 09/28/22 2127 09/29/22 0436 09/29/22 0830  BP: (!) 123/98 122/78 116/79 136/83  Pulse: 68 95 98 93  Resp: 18 18 18 18   Temp: 98.5 F (36.9 C) 98.1 F (36.7 C) 98.5 F (36.9 C) 98.5 F (36.9 C)  TempSrc: Axillary   Axillary  SpO2: 100% 98% 99% 92%  Weight: 74 kg     Height:        Constitutional: NAD, calm, comfortable Vitals:   09/28/22 1620 09/28/22 2127 09/29/22 0436 09/29/22 0830  BP: (!) 123/98 122/78 116/79 136/83  Pulse: 68 95 98 93  Resp: 18 18 18 18   Temp: 98.5 F (36.9 C) 98.1 F (36.7 C) 98.5 F (36.9 C) 98.5 F (36.9 C)  TempSrc: Axillary   Axillary  SpO2: 100% 98% 99% 92%  Weight: 74 kg     Height:       Eyes: PERRL, lids and conjunctivae normal ENMT: Mucous membranes are moist.  Neck: normal, supple Respiratory: clear to auscultation bilaterally, no wheezing, no crackles. Cardiovascular: RRR Abdomen: no tenderness, no masses palpated. No hepatosplenomegaly. Bowel sounds positive.  Musculoskeletal: no clubbing / cyanosis. No joint deformity upper and lower extremities. Good ROM, no contractures. Normal muscle tone.  Skin: pale Neurologic: moving all extremities, pleasantly confused Psychiatric: Normal mood/affect  Labs on Admission: I have personally reviewed following labs and imaging studies  CBC: Recent Labs   Lab 09/28/22 0311 09/28/22 0736 09/28/22 1632  WBC 10.8*  --   --   NEUTROABS 8.6*  --   --   HGB 7.5* 6.7* 7.8*  HCT 24.6* 22.1* 24.5*  MCV 79.6*  --   --   PLT 229  --   --     Basic Metabolic Panel: Recent Labs  Lab 09/28/22 0311  NA 138  K 4.4  CL 106  CO2 21*  GLUCOSE 127*  BUN 59*  CREATININE 2.21*  CALCIUM 8.3*    GFR: Estimated Creatinine Clearance: 24.4 mL/min (Lanayah Gartley) (by C-G formula based on SCr of 2.21 mg/dL (H)).  Liver Function Tests: Recent Labs  Lab 09/28/22 0311  AST 12*  ALT 11  ALKPHOS 64  BILITOT 0.6  PROT 6.7  ALBUMIN 2.9*    Urine analysis:    Component Value Date/Time   COLORURINE AMBER (Myleah Cavendish) 09/04/2021 1327   APPEARANCEUR TURBID (Alyssandra Hulsebus) 09/04/2021 1327  LABSPEC 1.010 09/04/2021 1327   PHURINE 6.0 09/04/2021 1327   GLUCOSEU NEGATIVE 09/04/2021 1327   HGBUR MODERATE (Emmitte Surgeon) 09/04/2021 1327   BILIRUBINUR NEGATIVE 09/04/2021 1327   KETONESUR NEGATIVE 09/04/2021 1327   PROTEINUR 100 (Sharena Dibenedetto) 09/04/2021 1327   NITRITE NEGATIVE 09/04/2021 1327   LEUKOCYTESUR MODERATE (Sharen Youngren) 09/04/2021 1327    Radiological Exams on Admission: No results found.  EKG: Independently reviewed. Atrial fibrillation with RVR  Assessment/Plan Principal Problem:   GI bleed Active Problems:   Rectal bleeding    Assessment and Plan:  Goals of Care Extensive discussion with 2 daughters this morning.  He's enrolled in hospice.  Question at this time is regarding transfusion.  Daughters present [employer] Witnesses so would prefer we try not to get blood, but they acknowledge this may not be his believe and planned discussion with his guardian to make decision.  If no plans to transfuse, comfort measures probably appropriate.  If transfuse and bleeding stops, I expect his life expectancy will probably be similar to whatever it was prior to his bleeding.  Family present appreciated discussion, they'll discuss with guardian and hospice.    After discussion plan to transfuse as  needed.  If bleeding stops and Hb/Hct stable, I expect he'll be able to go back to [place] with hospice.  Acute Blood Loss Anemia Bright Red Blood Per Rectum History Diverticular Bleeding 11.2 08/2021, 7.5 on presentation - suspects this represents Durene Dodge diverticular bleed Downtrending Decision for transfusion after discussion with family Transfuse as needed Hold eliquis, this will be discontinued based on his life threatening bleed at discharge Will continue to trend H/H He was on abx prior to admission for diverticulitis, low suspicion, will stop abx No plans for intervention (GI c/s, CTA - with aki)  Acute Kidney Injury Follow with transfusion Suspect due to acute blood loss  Dementia Wheelchair bound Waxes and wanes Has ativan prn at home Delirium precautions  Hypertension Hold lasix with bleeding Hold metoprolol with acute blood loss anemia  Atrial Fibrillation Metop on hold Discontinue eliquis at discharge     DVT prophylaxis: SCD  Code Status:   DNR  Family Communication:  daughters  Disposition Plan:   Patient is from:  [place]  Anticipated DC to:  [facility]  Anticipated DC date:  7/30  Anticipated DC barriers: Pending improvement in bleeding  Consults called:  none  Admission status:  inpatient   Severity of Illness: The appropriate patient status for this patient is INPATIENT. Inpatient status is judged to be reasonable and necessary in order to provide the required intensity of service to ensure the patient's safety. The patient's presenting symptoms, physical exam findings, and initial radiographic and laboratory data in the context of their chronic comorbidities is felt to place them at high risk for further clinical deterioration. Furthermore, it is not anticipated that the patient will be medically stable for discharge from the hospital within 2 midnights of admission.   * I certify that at the point of admission it is my clinical judgment that the  patient will require inpatient hospital care spanning beyond 2 midnights from the point of admission due to high intensity of service, high risk for further deterioration and high frequency of surveillance required.[name] MD [facility]  How to contact the [facility] Attending or Consulting provider 7A - 7P or covering provider during after hours 7P -7A, for this patient?   Check the care team in [facility] and look for Rinoa Garramone) attending/consulting TRH provider listed  and b) the [facility] team listed Log into [email or web address] and use [place] universal password to access. If you do not have the password, please contact the hospital operator. Locate the [facility] provider you are looking for under [facility] and page to Aliz Meritt number that you can be directly reached. If you still have difficulty reaching the provider, please page the [facility] (Director on Call) for the Hospitalists listed on amion for assistance.  09/29/2022, 9:19 AM

## 2022-09-29 NOTE — Plan of Care (Signed)
  Problem: Health Behavior/Discharge Planning: Goal: Ability to manage health-related needs will improve Outcome: Progressing   Problem: Clinical Measurements: Goal: Ability to maintain clinical measurements within normal limits will improve Outcome: Progressing Goal: Will remain free from infection Outcome: Progressing Goal: Diagnostic test results will improve Outcome: Progressing Goal: Cardiovascular complication will be avoided Outcome: Progressing   Problem: Activity: Goal: Risk for activity intolerance will decrease Outcome: Progressing   Problem: Nutrition: Goal: Adequate nutrition will be maintained Outcome: Progressing   Problem: Coping: Goal: Level of anxiety will decrease Outcome: Progressing   Problem: Elimination: Goal: Will not experience complications related to bowel motility Outcome: Progressing   Problem: Safety: Goal: Ability to remain free from injury will improve Outcome: Progressing   Problem: Skin Integrity: Goal: Risk for impaired skin integrity will decrease Outcome: Progressing   Problem: Education: Goal: Ability to identify signs and symptoms of gastrointestinal bleeding will improve Outcome: Progressing   Problem: Bowel/Gastric: Goal: Will show no signs and symptoms of gastrointestinal bleeding Outcome: Progressing   Problem: Fluid Volume: Goal: Will show no signs and symptoms of excessive bleeding Outcome: Progressing   Problem: Clinical Measurements: Goal: Complications related to the disease process, condition or treatment will be avoided or minimized Outcome: Progressing

## 2022-09-29 NOTE — Progress Notes (Signed)
[facility] 5M12 [facility] Hospitalized Hospice Liaison Note   Ryan [name] is a current hospice patient followed at [facility] for terminal diagnosis Alzheimer's.  Patient was taken to the ED for evaluation of complaint of dark red blood from patient's rectum for 1-2 days.  [facility] was not notified or contacted by the facility or family.  Received notification of patient status by Dr. [name] at [facility] ED.  Patient admitted to [facility] 7.28.24 with GI Bleed.  Per Dr. Kirt Boys with [facility], this is a related hospital admission.   Checked in with bedside RN who reported that patient ate 50% of breakfast (fed by daughter) and has been sleeping peacefully this morning with no complaints. Then visited patient at bedside with no visitors in the room. Patient was sitting up in bed with eyes closed in NAD, skin was pale/respirations slightly shallow and patient did not respond to verbal stimuli during visit. Spoke to daughter on telephone to support emotionally and answer hospice related questions.    Patient is appropriate for GIP level of care due to the need for frequent skilled assessments during administration of  IV antibiotic and possible blood transfusion.   V/S:  98.5, 93, 18, 136/83, 92% sats on RA I&O: 420/650 (-230) Abnormal lab work: hemo 7.4, Hct 23.6,  Diagnostics:  No new results  IVs/PRNs: None administered this shift   MD EPIC Problem list: Suspects diverticular bleed-Downtrending, Decision for transfusion after discussion with family Transfuse as needed,Hold eliquis, this will be discontinued based on his life threatening bleed at discharge Will continue to trend H/H. He was on abx prior to admission for diverticulitis, low suspicion, will stop abx No plans for intervention (GI c/s, CTA - with aki)   Acute Kidney Injury- Follow with transfusion,Suspect due to acute blood loss  Hypertension-Hold lasix with bleeding,Hold  metoprolol with acute blood loss anemia   Atrial Fibrillation-Metop on hold, Discontinue eliquis at discharge    D/C planning- Ongoing assessment as results/labs being evaluated.  Please use [facility] for all [facility] patient transport needs upon discharge.  Goals of Care: Clear - Patient is a DNR. Family confirms continued wishes to remain on hospice upon discharge. Communication with IDT- Updated team. Communication with PCG- Spoke with daughter Ryan [name] to support and answer hospice related questions. Family has contact information and encouraged to reach out as needed. Family is aware that [facility] will continue to visit patient daily during this hospitalization.   Please call with any hospice related questions/concerns,   Roda Shutters, RN [facility] Liaison (in [place]) [phone]

## 2022-09-29 NOTE — Plan of Care (Signed)
  Problem: Education: Goal: Knowledge of General Education information will improve Description: Including pain rating scale, medication(s)/side effects and non-pharmacologic comfort measures Outcome: Completed/Met

## 2022-09-29 NOTE — Progress Notes (Signed)
PROGRESS NOTE    Ryan [name]  ZOX:[id number] DOB: [birth year] DOA: 09/28/2022 PCP: Patient, No Pcp Per  Chief Complaint  Patient presents with   Rectal Bleeding    Brief Narrative:   Ryan [name] is Ryan [name] 88 y.o. male with medical history significant of demntia, atrial fibrilaltion on eliquis, hx GI bleed, BPH and multiple other medical problems presenting with bright red blood per rectum.   Assessment & Plan:   Principal Problem:   GI bleed Active Problems:   Rectal bleeding  Goals of Care From yesterday - Extensive discussion with 2 daughters this morning.  He's enrolled in hospice.  Question at this time is regarding transfusion.  Daughters present [employer] Witnesses so would prefer we try not to get blood, but they acknowledge this may not be his believe and planned discussion with his guardian to make decision.  If no plans to transfuse, comfort measures probably appropriate.  If transfuse and bleeding stops, I expect his life expectancy will probably be similar to whatever it was prior to his bleeding.  Family present appreciated discussion, they'll discuss with guardian and hospice.  After discussion plan to transfuse as needed.  If bleeding stops and Hb/Hct stable, I expect he'll be able to go back to [place] with hospice.   Acute Blood Loss Anemia Bright Red Blood Per Rectum History Diverticular Bleeding 11.2 08/2021, 7.5 on presentation - suspects this represents Ryan [name] diverticular bleed Downtrending Decision for transfusion after discussion with family S/p 1 unit pRBC  Hold eliquis, this will be discontinued based on his life threatening bleed at discharge Will continue to trend H/H - maybe able to discharge to [place] 7/30 if Hb remains stable  He was on abx prior to admission for diverticulitis, low suspicion, will stop abx No plans for intervention (GI c/s, CTA - with aki)   Acute Kidney Injury Follow with transfusion Some improvement today, trend Suspect due  to acute blood loss   Dementia Wheelchair bound Waxes and wanes Has ativan prn at home Delirium precautions   Hypertension Hold lasix with bleeding Hold metoprolol with acute blood loss anemia   Atrial Fibrillation Metop on hold Discontinue eliquis at discharge     DVT prophylaxis: none Code Status: DNR Family Communication: none Disposition:   Status is: Inpatient Remains inpatient appropriate because: continued need for inpatient care   Consultants:  none  Procedures:  none  Antimicrobials:  Anti-infectives (From admission, onward)    None       Subjective: Sleepy, no complaints, but he tells me to leave him alone  Objective: Vitals:   09/28/22 1620 09/28/22 2127 09/29/22 0436 09/29/22 0830  BP: (!) 123/98 122/78 116/79 136/83  Pulse: 68 95 98 93  Resp: 18 18 18 18   Temp: 98.5 F (36.9 C) 98.1 F (36.7 C) 98.5 F (36.9 C) 98.5 F (36.9 C)  TempSrc: Axillary   Axillary  SpO2: 100% 98% 99% 92%  Weight: 74 kg     Height:        Intake/Output Summary (Last 24 hours) at 09/29/2022 1553 Last data filed at 09/29/2022 0600 Gross per 24 hour  Intake 420 ml  Output 650 ml  Net -230 ml   Filed Weights   09/28/22 0306 09/28/22 1620  Weight: 74.6 kg 74 kg    Examination:  General exam: Appears calm and comfortable - tells me to leave him alone after I try to wake him Respiratory system: unlabored Central nervous system: sleepy Extremities: no LEE  Data Reviewed: I have personally reviewed following labs and imaging studies  CBC: Recent Labs  Lab 09/28/22 0311 09/28/22 0736 09/28/22 1632 09/29/22 0755 09/29/22 0801  WBC 10.8*  --   --  7.8  --   NEUTROABS 8.6*  --   --   --   --   HGB 7.5* 6.7* 7.8* 7.6* 7.4*  HCT 24.6* 22.1* 24.5* 24.1* 23.6*  MCV 79.6*  --   --  81.1  --   PLT 229  --   --  202  --     Basic Metabolic Panel: Recent Labs  Lab 09/28/22 0311 09/29/22 0755  NA 138 140  K 4.4 3.8  CL 106 110  CO2 21* 22   GLUCOSE 127* 108*  BUN 59* 53*  CREATININE 2.21* 1.96*  CALCIUM 8.3* 8.0*  MG  --  2.3  PHOS  --  4.0    GFR: Estimated Creatinine Clearance: 27.6 mL/min (Ryan [name]) (by C-G formula based on SCr of 1.96 mg/dL (H)).  Liver Function Tests: Recent Labs  Lab 09/28/22 0311  AST 12*  ALT 11  ALKPHOS 64  BILITOT 0.6  PROT 6.7  ALBUMIN 2.9*    CBG: No results for input(s): "GLUCAP" in the last 168 hours.   Recent Results (from the past 240 hour(s))  MRSA Next Gen by PCR, Nasal     Status: None   Collection Time: 09/28/22  5:29 PM   Specimen: Nasal Mucosa; Nasal Swab  Result Value Ref Range Status   MRSA by PCR Next Gen NOT DETECTED NOT DETECTED Final    Comment: (NOTE) The GeneXpert MRSA Assay (FDA approved for NASAL specimens only), is one component of [name] comprehensive MRSA colonization surveillance program. It is not intended to diagnose MRSA infection nor to guide or monitor treatment for MRSA infections. Test performance is not FDA approved in patients less than 25 years old. Performed at [facility], [address] [place], [place] [address]          Radiology Studies: No results found.      Scheduled Meds:  feeding supplement  237 mL Oral BID BM   Continuous Infusions:   LOS: 1 day    Time spent: over 30 min    [name], MD [facility]   To contact the attending provider between 7A-7P or the covering provider during after hours 7P-7A, please log into the web site [email or web address] and access using universal [place] password for that web site. If you do not have the password, please call the hospital operator.  09/29/2022, 3:53 PM

## 2022-09-30 DIAGNOSIS — K625 Hemorrhage of anus and rectum: Secondary | ICD-10-CM | POA: Diagnosis not present

## 2022-09-30 LAB — HEMOGLOBIN AND HEMATOCRIT, BLOOD
HCT: 22.2 % — ABNORMAL LOW (ref 39.0–52.0)
HCT: 22.5 % — ABNORMAL LOW (ref 39.0–52.0)
Hemoglobin: 7.2 g/dL — ABNORMAL LOW (ref 13.0–17.0)
Hemoglobin: 7.2 g/dL — ABNORMAL LOW (ref 13.0–17.0)

## 2022-09-30 LAB — PREPARE RBC (CROSSMATCH)

## 2022-09-30 MED ORDER — ACETAMINOPHEN 325 MG PO TABS
650.0000 mg | ORAL_TABLET | Freq: Four times a day (QID) | ORAL | Status: DC | PRN
  Filled 2022-09-30: qty 2

## 2022-09-30 MED ORDER — SODIUM CHLORIDE 0.9 % IV SOLN
250.0000 mg | Freq: Every day | INTRAVENOUS | Status: DC
  Administered 2022-09-30 – 2022-10-03 (×4): 250 mg via INTRAVENOUS
  Filled 2022-09-30 (×4): qty 20

## 2022-09-30 MED ORDER — SODIUM CHLORIDE 0.9% IV SOLUTION: Freq: Once | INTRAVENOUS | Status: DC

## 2022-09-30 NOTE — Progress Notes (Signed)
SLP Cancellation Note  Patient Details Name: Ryan [name] MRN: [id number] DOB: 12-Mar-1934   Cancelled treatment:       Reason Eval/Treat Not Completed: Fatigue/lethargy limiting ability to participate. Stopped by pt's room earlier today and he was sound asleep. No family present at that time to offer education. Will f/u as able.     Mahala Menghini., M.A. CCC-SLP Acute Rehabilitation Services Office [phone]  Secure chat preferred  09/30/2022, 3:01 PM

## 2022-09-30 NOTE — Progress Notes (Addendum)
PROGRESS NOTE    Ryan [name]  AYT:[id number] DOB: [birth year] DOA: 09/28/2022 PCP: Patient, No Pcp Per  Chief Complaint  Patient presents with   Rectal Bleeding    Brief Narrative:   Ryan [name] is Ryan [name] 88 y.o. male with medical history significant of demntia, atrial fibrilaltion on eliquis, hx GI bleed, BPH and multiple other medical problems presenting with bright red blood per rectum.   Discharge pending further stability, hemostasis.  GI consult.   Assessment & Plan:   Principal Problem:   GI bleed Active Problems:   Rectal bleeding  Goals of Care Ryan [name] was enrolled with hospice prior to admission.  Discussed on phone with his 3 daughters and [name] Ryan [name].  At this point, he's continued to have episodes of bleeding, requiring additional transfusion.  With last dose of eliquis being Friday, I'd expect most of this effect to have worn off by now.  He's had intermittent episodes of bleeding (large dark red bowel movement yesterday) and small dark stool today.  Discussed that I'm hopeful things continue to slow down and eventually stop and we can get him back to [place] with hospice.  Discussed what we would do in the situation that things did not improve or if he continued bleeding.  His [name] wants to continue supportive care with transfusions.  He also notes interest in procedures if indicated.  Discussed my inclination for Ryan [name] would be to try to be conservative and avoid interventions if possible given his hospice status, but will ask GI to see him given continued bleeding and dark stools that have been noted since admission.  Ryan [name] expressed interest in reconvening in 48 hrs to discuss his care again based on what happens in the interim.  Of note, some daughters are Jehovah's Witnesses and prefer to avoid transfusions, but we've decided on transfusion goal of 7 based on discussion with his [name].   Acute Blood Loss Anemia Bright Red Blood Per  Rectum History Diverticular Bleeding 11.2 08/2021, 7.5 on presentation - suspects this represents Ryan [name] diverticular bleed Downtrending Decision for transfusion after discussion with family S/p 3 unit pRBC  IV iron  Hold eliquis, this will be discontinued based on his life threatening bleed at discharge Will continue to trend H/H  With recent dark stools he's been started on PPI gtt - will consult GI as well, his [name] would be interested in any procedure indicated (see conversation above)   Acute Kidney Injury Follow with transfusion Some improvement today, trend Suspect due to acute blood loss   Dementia Wheelchair bound Waxes and wanes Has ativan prn at home Delirium precautions   Hypertension Hold lasix with bleeding Hold metoprolol with acute blood loss anemia   Atrial Fibrillation Metop on hold Discontinue eliquis at discharge     DVT prophylaxis: none Code Status: DNR Family Communication: none Disposition:   Status is: Inpatient Remains inpatient appropriate because: continued need for inpatient care   Consultants:  none  Procedures:  none  Antimicrobials:  Anti-infectives (From admission, onward)    None       Subjective: No complaints  Objective: Vitals:   09/29/22 2001 09/30/22 0409 09/30/22 0850 09/30/22 1633  BP: 101/60 116/71 120/66 104/62  Pulse: 98 91 96 (!) 103  Resp: 18 18 18 18   Temp: 98.9 F (37.2 C) 98.5 F (36.9 C) 98.3 F (36.8 C) 98.2 F (36.8 C)  TempSrc:  Oral  Oral  SpO2: 100% 98% 98% 100%  Weight:  Height:        Intake/Output Summary (Last 24 hours) at 09/30/2022 1813 Last data filed at 09/30/2022 1640 Gross per 24 hour  Intake 543.11 ml  Output 600 ml  Net -56.89 ml   Filed Weights   09/28/22 0306 09/28/22 1620  Weight: 74.6 kg 74 kg    Examination:  General: No acute distress. Cardiovascular: RRR Lungs: unlabored Neurological: Alert. Moves all extremities 4 with equal strength. Cranial nerves  II through XII grossly intact. Extremities: No clubbing or cyanosis. No edema.   Data Reviewed: I have personally reviewed following labs and imaging studies  CBC: Recent Labs  Lab 09/28/22 0311 09/28/22 0736 09/29/22 0755 09/29/22 0801 09/29/22 1642 09/30/22 0047 09/30/22 1509  WBC 10.8*  --  7.8  --   --   --   --   NEUTROABS 8.6*  --   --   --   --   --   --   HGB 7.5*   < > 7.6* 7.4* 6.5* 7.2* 7.2*  HCT 24.6*   < > 24.1* 23.6* 21.1* 22.2* 22.5*  MCV 79.6*  --  81.1  --   --   --   --   PLT 229  --  202  --   --   --   --    < > = values in this interval not displayed.    Basic Metabolic Panel: Recent Labs  Lab 09/28/22 0311 09/29/22 0755  NA 138 140  K 4.4 3.8  CL 106 110  CO2 21* 22  GLUCOSE 127* 108*  BUN 59* 53*  CREATININE 2.21* 1.96*  CALCIUM 8.3* 8.0*  MG  --  2.3  PHOS  --  4.0    GFR: Estimated Creatinine Clearance: 27.6 mL/min (Ryan [name]) (by C-G formula based on SCr of 1.96 mg/dL (H)).  Liver Function Tests: Recent Labs  Lab 09/28/22 0311  AST 12*  ALT 11  ALKPHOS 64  BILITOT 0.6  PROT 6.7  ALBUMIN 2.9*    CBG: No results for input(s): "GLUCAP" in the last 168 hours.   Recent Results (from the past 240 hour(s))  MRSA Next Gen by PCR, Nasal     Status: None   Collection Time: 09/28/22  5:29 PM   Specimen: Nasal Mucosa; Nasal Swab  Result Value Ref Range Status   MRSA by PCR Next Gen NOT DETECTED NOT DETECTED Final    Comment: (NOTE) The GeneXpert MRSA Assay (FDA approved for NASAL specimens only), is one component of Ryan [name] comprehensive MRSA colonization surveillance program. It is not intended to diagnose MRSA infection nor to guide or monitor treatment for MRSA infections. Test performance is not FDA approved in patients less than 53 years old. Performed at [facility], [address] [place], [place] [address]          Radiology Studies: No results found.      Scheduled Meds:  sodium chloride   Intravenous Once    feeding supplement  237 mL Oral BID BM   [START ON 10/03/2022] pantoprazole  40 mg Intravenous Q12H   Continuous Infusions:  ferric gluconate (FERRLECIT) IVPB 250 mg (09/30/22 1632)   pantoprazole 8 mg/hr (09/30/22 1044)     LOS: 2 days    Time spent: over 30 min    Ryan Nicks, MD [facility]   To contact the attending provider between 7A-7P or the covering provider during after hours 7P-7A, please log into the web site [email or web address] and access using  universal  password for that web site. If you do not have the password, please call the hospital operator.  09/30/2022, 6:13 PM

## 2022-09-30 NOTE — Progress Notes (Signed)
[facility] Liaison Note   Ryan [name] is a current hospice patient followed at [facility] for terminal diagnosis Alzheimer's. Patient was taken to the ED for evaluation of complaint of dark red blood from patient's rectum for 1-2 days. [facility] was not notified or contacted by the facility or family. Received notification of patient status by Dr. [name] at [facility] ED. Patient admitted to [facility] 7.28.24 with GI Bleed. Per Dr. Kirt Boys with [facility], this is a related hospital admission.   Visited patient at bedside, daughter Ryan [name] was present and feeding patient. Patient is awake and acknowledges LCSW presence. He drifts off to sleep easily, but denies any pain. Ryan [name] states that they are planning to meet with MD to discuss plan moving forward. Patient continues to have bleeding which require blood transfusions. Ryan [name] states that they will make a decision on GOC after they meet with MD and have more information/options presented.   Patient remains inpatient appropriate due to the continued need for blood transfusions.     V/S: 98.2, 103, 18, 104/62, 100% on room air    I/O: 617,1 ml IV fluids, 426 ml blood, 540 ml PO/ 1, 950 ml urine    Abnormal Labs:  Hemoglobin 13.0 - 17.0 g/dL 7.2 (L)  HCT 36.6 - 44.0 % 22.5 (L)   Diagnostics: none since 7/5    IV/PRN: IV protonix scheduled 40 mg, IV ferrlecit 250 ml daily   Problem List: Acute Blood Loss Anemia Bright Red Blood Per Rectum History Diverticular Bleeding 11.2 08/2021, 7.5 on presentation - suspects this represents a diverticular bleed Downtrending Decision for transfusion after discussion with family S/p 1 unit pRBC  Hold eliquis, this will be discontinued based on his life threatening bleed at discharge Will continue to trend H/H - maybe able to discharge to [facility] 7/30 if Hb remains stable  He was on abx prior to admission for diverticulitis, low  suspicion, will stop abx No plans for intervention (GI c/s, CTA - with aki)   Acute Kidney Injury Follow with transfusion Some improvement today, trend Suspect due to acute blood loss   Dementia Wheelchair bound Waxes and wanes Has ativan prn at home Delirium precautions   Hypertension Hold lasix with bleeding Hold metoprolol with acute blood loss anemia   Atrial Fibrillation Metop on hold Discontinue eliquis at discharge   Discharge Planning: ongoing    Family Contact: daughter present at bedside    IDT: Updated   Goals of Care: DNR, GOC discussion will be held today   Please use [facility] for hospice patients at discharge. Please call with any questionsor concerns. Thank you  Dionicio Stall, [facility] Liaison [phone]

## 2022-10-01 DIAGNOSIS — K922 Gastrointestinal hemorrhage, unspecified: Secondary | ICD-10-CM | POA: Diagnosis not present

## 2022-10-01 NOTE — Progress Notes (Signed)
Speech Language Pathology Treatment: Dysphagia  Patient Details Name: Ryan [name] MRN: [id number] DOB: 1934/10/29 Today's Date: 10/01/2022 Time: 7564-3329 SLP Time Calculation (min) (ACUTE ONLY): 42 min  Assessment / Plan / Recommendation Clinical Impression  Pt's daughter, Britta Mccreedy, reports no difficulty with Dys 1 trays and states that she has observed less coughing during meal times compared to when pt is back at [facility]. Pt agreeable to minimal trials of thin liquids and purees with no overt s/s of dysphagia or aspiration noted. Discussed current presentation with Britta Mccreedy as well as continued SLP intervention. Britta Mccreedy called her sister, [name], to discuss potential for pursuing MBS further. Pt currently receives hospice services and his daughters are mindful of his quality of life, but feel that an instrumental swallow study would make them better informed while having further GOC conversations. Discussed that an MBS is only representative of a singular moment in time and given pt's fluctuating mentation, they acknowledge the likelihood that he will continue to decline. His daughters state that they have noticed coughing with meals for a while now and are curious about the implications and risk for further infection/decline. Provided education regarding process of MBS as well as potential for taking informed risk following instrumental study despite SLP recommendations to increase pt's quality of life. Reinforced education about continuing current diet with use of swallowing strategies and aspiration precautions. Will plan for MBS next date as scheduling allows.    HPI HPI: Pt is an 88 yo male presenting 7/28 with concern for GIB. Pt was noted to have difficulty swallowing water in the ED and SLP swallow eval was ordered. CXR without significant acute changes. Pt is under hospice care for alzheimer's disease. PMH also includes: TIA, gait disorder, EGD with dilation      SLP Plan  MBS       Recommendations for follow up therapy are one component of a multi-disciplinary discharge planning process, led by the attending physician.  Recommendations may be updated based on patient status, additional functional criteria and insurance authorization.    Recommendations  Diet recommendations: Dysphagia 1 (puree);Thin liquid Liquids provided via: Cup;Straw Medication Administration: Crushed with puree Supervision: Staff to assist with self feeding;Full supervision/cueing for compensatory strategies Compensations: Minimize environmental distractions;Slow rate;Small sips/bites;Follow solids with liquid Postural Changes and/or Swallow Maneuvers: Seated upright 90 degrees;Upright 30-60 min after meal                  Oral care QID   Frequent or constant Supervision/Assistance Dysphagia, unspecified (R13.10)     MBS     Gwynneth Aliment, M.A., CF-SLP Speech Language Pathology, Acute Rehabilitation Services  Secure Chat preferred [phone]   10/01/2022, 4:03 PM

## 2022-10-01 NOTE — Progress Notes (Signed)
Ryan [name]  ZOX:[id number] DOB: [birth year] DOA: 09/28/2022 PCP: Patient, No Pcp Per    Brief Narrative:  88 year old SNF resident on hospice care with a history of dementia, chronic atrial fibrillation on Eliquis, GIB, and BPH who presented to the ER 7/28 with BRBPR.  Goals of Care:   Code Status: DNR   DVT prophylaxis: SCDs Start: 09/28/22 1017  Interim Hx: Afebrile.  Vital signs stable.  Renal function continues to improve.  BUN stable.  Hemoglobin appears to be holding stable at 7.1.  Patient had another maroon-colored stool this morning just prior to my exam.  He is resting comfortably in bed at the time of my visit and does not appear to be uncomfortable.  There is no evidence of distress.  He does not answer any of my questions.  Assessment & Plan:  Acute blood loss anemia -bright red blood per rectum -suspected diverticular GIB Baseline hemoglobin 11.2 -hemoglobin 7.5 on presentation-clinically most consistent with a diverticular source -status post 3 units PRBC thus far -has been dosed with IV iron -Eliquis discontinued  Recent Labs  Lab 09/29/22 0801 09/29/22 1642 09/30/22 0047 09/30/22 1509 10/01/22 0335  HGB 7.4* 6.5* 7.2* 7.2* 7.1*    Acute kidney injury Due to blood loss/hypoperfusion -creatinine slowly improving  Recent Labs  Lab 09/28/22 0311 09/29/22 0755 10/01/22 0335  CREATININE 2.21* 1.96* 1.90*    Alzheimer's dementia Mental status reportedly waxes and wanes -patient is wheelchair-bound at baseline -he follows me with his eyes but does not respond to any questions  HTN Blood pressure currently well-controlled  Chronic atrial fibrillation Eliquis is no longer appropriate in this patient given his life-threatening GI bleeding   Family Communication: No family present at time of exam Disposition: Will depend upon cessation of bleeding/medical stability   Objective: Blood pressure 109/62, pulse 94, temperature 98 F (36.7 C), temperature  source Oral, resp. rate 18, height 5\' 9"  (1.753 m), weight 74 kg, SpO2 96%.  Intake/Output Summary (Last 24 hours) at 10/01/2022 0947 Last data filed at 10/01/2022 0018 Gross per 24 hour  Intake 29.02 ml  Output 550 ml  Net -520.98 ml   Filed Weights   09/28/22 0306 09/28/22 1620  Weight: 74.6 kg 74 kg    Examination: General: No acute respiratory distress Lungs: Clear to auscultation bilaterally without wheezes or crackles Cardiovascular: Regular rate and rhythm without murmur gallop or rub normal S1 and S2 Abdomen: Nontender, nondistended, soft, bowel sounds positive, no rebound, no ascites, no appreciable mass Extremities: No significant cyanosis, clubbing, or edema bilateral lower extremities  CBC: Recent Labs  Lab 09/28/22 0311 09/28/22 0736 09/29/22 0755 09/29/22 0801 09/30/22 0047 09/30/22 1509 10/01/22 0335  WBC 10.8*  --  7.8  --   --   --  10.1  NEUTROABS 8.6*  --   --   --   --   --  7.7  HGB 7.5*   < > 7.6*   < > 7.2* 7.2* 7.1*  HCT 24.6*   < > 24.1*   < > 22.2* 22.5* 22.6*  MCV 79.6*  --  81.1  --   --   --  83.7  PLT 229  --  202  --   --   --  151   < > = values in this interval not displayed.   Basic Metabolic Panel: Recent Labs  Lab 09/28/22 0311 09/29/22 0755 10/01/22 0335  NA 138 140 137  K 4.4 3.8 3.3*  CL 106 110  108  CO2 21* 22 20*  GLUCOSE 127* 108* 112*  BUN 59* 53* 53*  CREATININE 2.21* 1.96* 1.90*  CALCIUM 8.3* 8.0* 7.8*  MG  --  2.3 2.2  PHOS  --  4.0 3.3   GFR: Estimated Creatinine Clearance: 28.4 mL/min (A) (by C-G formula based on SCr of 1.9 mg/dL (H)).   Scheduled Meds:  sodium chloride   Intravenous Once   feeding supplement  237 mL Oral BID BM   [START ON 10/03/2022] pantoprazole  40 mg Intravenous Q12H   Continuous Infusions:  ferric gluconate (FERRLECIT) IVPB 250 mg (09/30/22 1632)   pantoprazole 8 mg/hr (09/30/22 2309)     LOS: 3 days   Lonia Blood, MD [facility] Office  [phone] Pager - Text  Page per Loretha Stapler  If 7PM-7AM, please contact night-coverage per Amion 10/01/2022, 9:47 AM

## 2022-10-01 NOTE — Progress Notes (Signed)
[facility] 6Y40 [facility] hospitalized hospice patient visit  Ryan [name] is a current hospice patient followed at [facility] for terminal diagnosis Alzheimer's. Patient was taken to the ED for evaluation of complaint of dark red blood from patient's rectum for 1-2 days. [facility] was not notified or contacted by the facility or family. Received notification of patient status by Dr. [name] at [facility] ED. Patient admitted to [facility] 7.28.24 with GI Bleed. Per Dr. Kirt Boys with [facility], this is a related hospital admission.   Visited at bedside however patient sleeping and appeared in no distress. Exchanged report with bedside nurse. Per conversation with daughter patient will have a Barium Swallow likely tomorrow as the daughter's wish to know as much as possible about his swallowing status. He remains inpatient appropriate due to need for recent blood transfusion and monitoring for further bleeding issues and well as pending tests.  Vital Signs- 99.4/79/18    121/66    96% room air Intake/Output- 1224/1050 Abnormal labs- K+ 3.3, CO2 20, BUN 53, Creatinine 1.90, Ca+7.8, Albumin 2.4, AST 12, GFR 34, RBC 7.1, Hct 22.6 Diagnostics- None new      IV/PRN Meds- Ferric Gluconate 250mg  IV daily, Protonix 8mg /H continuous,  Problem List-   Acute blood loss anemia -bright red blood per rectum -suspected diverticular GIB Baseline hemoglobin 11.2 -hemoglobin 7.5 on presentation-clinically most consistent with a diverticular source -status post 3 units PRBC thus far -has been dosed with IV iron -Eliquis discontinued    Acute kidney injury Due to blood loss/hypoperfusion -creatinine slowly improving       lzheimer's dementia Mental status reportedly waxes and wanes -patient is wheelchair-bound at baseline -he follows me with his eyes but does not respond to any questions   Chronic atrial fibrillation Eliquis is no longer appropriate in this patient  given his life-threatening GI bleeding  Discharge Planning- Ongoing, still pending testing and receiving IV medications Family Contact- Talked with daughter by phone, it seems at this time goals remain unclear, planned Barium Swallow tomorrow IDT- Updated Goals of care- ongoing Thea Gist, RN, [facility] Liaison [phone]

## 2022-10-01 NOTE — Plan of Care (Signed)

## 2022-10-01 NOTE — Plan of Care (Signed)
  Problem: Clinical Measurements: Goal: Will remain free from infection Outcome: Completed/Met

## 2022-10-02 ENCOUNTER — Inpatient Hospital Stay (HOSPITAL_COMMUNITY)

## 2022-10-02 DIAGNOSIS — K922 Gastrointestinal hemorrhage, unspecified: Secondary | ICD-10-CM | POA: Diagnosis not present

## 2022-10-02 LAB — TYPE AND SCREEN
ABO/RH(D): O POS
Antibody Screen: NEGATIVE
Unit division: 0

## 2022-10-02 LAB — BPAM RBC
Blood Product Expiration Date: 202408252359
ISSUE DATE / TIME: 202408010528
Unit Type and Rh: 5100

## 2022-10-02 LAB — PREPARE RBC (CROSSMATCH)

## 2022-10-02 MED ORDER — SODIUM CHLORIDE 0.9% IV SOLUTION: Freq: Once | INTRAVENOUS | Status: AC

## 2022-10-02 NOTE — Progress Notes (Signed)
Ryan [name]  NUU:[id number] DOB: 1934-12-18 DOA: 09/28/2022 PCP: Patient, No Pcp Per    Brief Narrative:  88 year old SNF resident on hospice care with a history of dementia, chronic atrial fibrillation on Eliquis, GIB, and BPH who presented to the ER 7/28 with BRBPR.  Goals of Care:   Code Status: DNR   DVT prophylaxis: SCDs Start: 09/28/22 1017  Interim Hx: Hemoglobin dropped again overnight.  An additional unit of PRBC was transfused.  The patient underwent an MBS today with the result being a recommendation for a regular diet with thin liquids.  He is afebrile.  Vital signs are stable.  At the time of my visit the patient will open his eyes and look at the examiner but does not communicate in any meaningful way.  He does not appear to be uncomfortable.  I had a prolonged discussion with family today, with 1 daughter being in person and the remaining daughters on a conference call, along with the patient's established power of attorney.  The family confirmed to me that the patient's dementia has progressed to the point that he appears to have a very poor quality of life.  Most times he is not able to communicate with the family and they are unable to understand what he says.  He is frequently incontinent of stool and urine.  He is confined to a skilled nursing facility.  In this setting I discussed our options concerning his ongoing bleeding.  I explained that I did not feel he was a good candidate for ongoing aggressive care, and suggested that further transfusion also would not be appropriate.  I suggested transitioning to comfort care as the most appropriate medical intervention.  The patient's daughters all seem to embrace this suggestion, but the patient's POA was of a different opinion and wished to investigate other options.  I have agreed to recheck a CBC in the morning and to ask the patient's GI doctor to comment on whether they feel he is an appropriate candidate for ongoing  aggressive interventions and care.  Assessment & Plan:  Acute blood loss anemia -bright red blood per rectum -suspected diverticular GIB Baseline hemoglobin 11.2 -hemoglobin 7.5 on presentation-clinically most consistent with a diverticular source -status post 4 units PRBC thus far -has been dosed with IV iron -Eliquis discontinued permanently - I will ask GI to comment on his case tomorrow as per the wishes of the family   Recent Labs  Lab 09/29/22 1642 09/30/22 0047 09/30/22 1509 10/01/22 0335 10/02/22 0204  HGB 6.5* 7.2* 7.2* 7.1* 6.5*    Acute kidney injury Due to blood loss/hypoperfusion - creatinine slowly improving  Recent Labs  Lab 09/28/22 0311 09/29/22 0755 10/01/22 0335  CREATININE 2.21* 1.96* 1.90*    Alzheimer's dementia Mental status reportedly waxes and wanes -patient is wheelchair-bound at baseline -he follows me with his eyes but does not respond to any questions -clinically appears that his quality of life is exceedingly low -I have explained very clearly to the patient's family that this is a progressive terminal illness and that it appears he is approaching the final stages of this disease  HTN Blood pressure currently well-controlled  Chronic atrial fibrillation Eliquis is no longer appropriate in this patient given his life-threatening GI bleeding   Family Communication:  Disposition: Will depend upon cessation of bleeding/medical stability and goals of care   Objective: Blood pressure 104/66, pulse (!) 113, temperature (!) 97.5 F (36.4 C), temperature source Axillary, resp. rate 19, height 5'  9" (1.753 m), weight 74 kg, SpO2 96%.  Intake/Output Summary (Last 24 hours) at 10/02/2022 0928 Last data filed at 10/02/2022 6160 Gross per 24 hour  Intake 1283.65 ml  Output 1550 ml  Net -266.35 ml   Filed Weights   09/28/22 0306 09/28/22 1620  Weight: 74.6 kg 74 kg    Examination: General: No acute respiratory distress Lungs: Clear to  auscultation bilaterally  Cardiovascular: Regular rate and rhythm  Abdomen: Nontender, nondistended, soft, bowel sounds positive Extremities: No significant edema bilateral lower extremities  CBC: Recent Labs  Lab 09/28/22 0311 09/28/22 0736 09/29/22 0755 09/29/22 0801 09/30/22 1509 10/01/22 0335 10/02/22 0204  WBC 10.8*  --  7.8  --   --  10.1 10.7*  NEUTROABS 8.6*  --   --   --   --  7.7  --   HGB 7.5*   < > 7.6*   < > 7.2* 7.1* 6.5*  HCT 24.6*   < > 24.1*   < > 22.5* 22.6* 21.0*  MCV 79.6*  --  81.1  --   --  83.7 84.3  PLT 229  --  202  --   --  151 145*   < > = values in this interval not displayed.   Basic Metabolic Panel: Recent Labs  Lab 09/28/22 0311 09/29/22 0755 10/01/22 0335  NA 138 140 137  K 4.4 3.8 3.3*  CL 106 110 108  CO2 21* 22 20*  GLUCOSE 127* 108* 112*  BUN 59* 53* 53*  CREATININE 2.21* 1.96* 1.90*  CALCIUM 8.3* 8.0* 7.8*  MG  --  2.3 2.2  PHOS  --  4.0 3.3   GFR: Estimated Creatinine Clearance: 28.4 mL/min (A) (by C-G formula based on SCr of 1.9 mg/dL (H)).   Scheduled Meds:  feeding supplement  237 mL Oral BID BM   [START ON 10/03/2022] pantoprazole  40 mg Intravenous Q12H   Continuous Infusions:  ferric gluconate (FERRLECIT) IVPB 250 mg (10/02/22 0910)   pantoprazole 8 mg/hr (10/02/22 0621)     LOS: 4 days   Lonia Blood, MD [facility] Office  [phone] Pager - Text Page per Loretha Stapler  If 7PM-7AM, please contact night-coverage per Amion 10/02/2022, 9:28 AM

## 2022-10-02 NOTE — Progress Notes (Signed)
Modified Barium Swallow Study  Patient Details  Name: Ryan [name] MRN: [id number] Date of Birth: 11-27-1934  Today's Date: 10/02/2022  Modified Barium Swallow completed.  Full report located under Chart Review in the Imaging Section.  History of Present Illness Pt is an 88 yo male presenting 7/28 with concern for GIB. Pt was noted to have difficulty swallowing water in the ED and SLP swallow eval was ordered. CXR without significant acute changes. Pt is under hospice care for alzheimer's disease. PMH also includes: TIA, gait disorder, EGD with dilation   Clinical Impression Pt presents with a mild oropharygneal dyspahgia, but exhibits excellent ability to protect his airway. He has trace to moderate residue present with liquid consitencies along his tongue, palate, base of tongue, and valleculae. This has the ability to continue progressing posteriorly after the swallow, but pt demonstrated good awareness and sensation as he initiated sequential swallows independently to manage these residuals. Pt has adequate laryngeal vestibule closure with no penetration/aspiration of any consistency administered. He efficiently and thoroughly masticated solids without oral residue. The pill was given with thin liquids and was then promptly and completely cleared during the esophageal sweep. Recommend a regular texture diet with thin liquids. Education provided to pt and his daughter, [name], following MBS. Reinforced use of aspiration precautions (thorough oral care, sitting upright, small bites/sips) and ordering managable meals. SLP will f/u x1 for reinforcement of education with pt and his daughters.  Factors that may increase risk of adverse event in presence of aspiration [employer]oots 2021): Reduced cognitive function;Respiratory or GI disease;Limited mobility  Swallow Evaluation Recommendations Recommendations: PO diet PO Diet Recommendation: Regular;Thin liquids (Level 0) Liquid Administration  via: Cup;Straw Medication Administration: Whole meds with liquid Supervision: Staff to assist with self-feeding;Set-up assistance for safety Swallowing strategies  : Slow rate;Small bites/sips;Minimize environmental distractions Postural changes: Position pt fully upright for meals;Stay upright 30-60 min after meals Oral care recommendations: Oral care BID (2x/day)    Gwynneth Aliment, M.A., CF-SLP Speech Language Pathology, Acute Rehabilitation Services  Secure Chat preferred [phone]  10/02/2022,9:28 AM

## 2022-10-02 NOTE — Progress Notes (Signed)
Brief Nutrition Note  Contacted by SLP who reports that RN requested Magic Cup be added to pt's meal tray as intake has been poor. Added in [employer].  Greig Castilla, RD, LDN Clinical Dietitian RD pager # available in AMION  After hours/weekend pager # available in [facility]

## 2022-10-02 NOTE — Progress Notes (Signed)
[facility] Liaison Note- Hospitalized Hospice Patient   Ryan [name] is a current hospice patient followed at [facility] for terminal diagnosis Alzheimer's. Patient was taken to the ED for evaluation of complaint of dark red blood from patient's rectum for 1-2 days. [facility] was not notified or contacted by the facility or family. Received notification of patient status by Dr. [name] at [facility] ED. Patient admitted to [facility] 7.28.24 with GI Bleed. Per Dr. Kirt Boys with [facility], this is a related hospital admission.   Patient asleep. No family at the bedside. Per MD, plan is to have family meeting again today to discuss GOC. Hgb dropped to 6.5 on labs today. Patient received 730 ml of blood. Awaiting further information after meeting today.   Patient remains inpatient due to need for blood transfusions and monitoring of further bleeding issues.   V/S: 97.5, 113, 101, 19, 104/66, 96 % on RA   I/O:  1,236.7 IV fluids, 730 ml, 1,860 via PO/ 4, 950 ml urine    Abnormal Labs:  WBC 4.0 - 10.5 K/uL 10.7 (H)  RBC 4.22 - 5.81 MIL/uL 2.49 (L)  Hemoglobin 13.0 - 17.0 g/dL 6.5 (LL)  HCT 75.6 - 43.3 % 21.0 (L)  RDW 11.5 - 15.5 % 19.5 (H)  Platelets 150 - 400 K/uL 145 (L)  nRBC 0.0 - 0.2 % 0.3 (H)    Diagnostics:  Modified Barium Swallow Study  Clinical Impression: Clinical Impression: Pt presents with a mild oropharygneal dyspahgia, but exhibits excellent ability to protect his airway. He has trace to moderate residue present with liquid consitencies along his tongue, palate, base of tongue, and valleculae. This has the ability to continue progressing posteriorly after the swallow, but pt demonstrated good awareness and sensation as he initiated sequential swallows independently to manage these residuals. Pt has adequate laryngeal vestibule with no penetration/aspiration of any consistency administered. He efficiently and thoroughly  masticated solids without oral residue. The pill was given with thin liquids and was then promptly and completely cleared during the esophageal sweep. Recommend a regular texture diet with thin liquids. Education provided to pt and his daughter, Ryan [name], following MBS. Reinforced use of aspiration precautions (thorough oral care, sitting upright, small bites/sips) and ordering managable meals. SLP will f/u x1 for reinforcement of education with pt and his daughters.   IV/PRN: IV protonix 40 mg every 12 hours,    Problem List: Acute blood loss anemia -bright red blood per rectum -suspected diverticular GIB Baseline hemoglobin 11.2 -hemoglobin 7.5 on presentation-clinically most consistent with a diverticular source -status post 3 units PRBC thus far -has been dosed with IV iron -Eliquis discontinued   Last Labs         Recent Labs  Lab 09/29/22 0801 09/29/22 1642 09/30/22 0047 09/30/22 1509 10/01/22 0335  HGB 7.4* 6.5* 7.2* 7.2* 7.1*        Acute kidney injury Due to blood loss/hypoperfusion -creatinine slowly improving   Last Labs       Recent Labs  Lab 09/28/22 0311 09/29/22 0755 10/01/22 0335  CREATININE 2.21* 1.96* 1.90*        Alzheimer's dementia Mental status reportedly waxes and wanes -patient is wheelchair-bound at baseline -he follows me with his eyes but does not respond to any questions   HTN Blood pressure currently well-controlled   Chronic atrial fibrillation Eliquis is no longer appropriate in this patient given his life-threatening GI bleeding   Discharge Planning: ongoing    Family Contact: daughters  updated    IDT: Updated   Goals of Care: DNR, GOC discussions continuing   Please use [facility] for hospice patients transportation at discharge.   Please call with any questions or concerns. Thank you  Dionicio Stall, [facility] Liaison [phone]

## 2022-10-02 NOTE — Progress Notes (Signed)
Latest Reference Range & Units 10/02/22 02:04  Hemoglobin 13.0 - 17.0 g/dL 6.5 (LL)  (LL): Data is critically low MD on call made aware.

## 2022-10-03 DIAGNOSIS — F0280 Dementia in other diseases classified elsewhere without behavioral disturbance: Secondary | ICD-10-CM | POA: Diagnosis not present

## 2022-10-03 DIAGNOSIS — I482 Chronic atrial fibrillation, unspecified: Secondary | ICD-10-CM | POA: Diagnosis not present

## 2022-10-03 DIAGNOSIS — K922 Gastrointestinal hemorrhage, unspecified: Secondary | ICD-10-CM | POA: Diagnosis not present

## 2022-10-03 DIAGNOSIS — K625 Hemorrhage of anus and rectum: Secondary | ICD-10-CM | POA: Diagnosis not present

## 2022-10-03 DIAGNOSIS — Z7901 Long term (current) use of anticoagulants: Secondary | ICD-10-CM | POA: Diagnosis not present

## 2022-10-03 MED ORDER — SODIUM CHLORIDE 0.9% IV SOLUTION: Freq: Once | INTRAVENOUS | Status: DC

## 2022-10-03 MED ORDER — PROCHLORPERAZINE MALEATE 5 MG PO TABS: 5.0000 mg | ORAL_TABLET | Freq: Four times a day (QID) | ORAL | Status: DC | PRN

## 2022-10-03 NOTE — Progress Notes (Signed)
Speech Language Pathology Treatment: Dysphagia  Patient Details Name: Ryan [name] MRN: [id number] DOB: [birth year] Today's Date: 10/03/2022 Time: 8657-8469 SLP Time Calculation (min) (ACUTE ONLY): 8 min  Assessment / Plan / Recommendation Clinical Impression  RN feeding pt upon SLP arrival and reports no concerns with pt's swallowing. Pt's daughters were not present in room today to provide follow up education. SLP observed pt with trials of thin liquids via straw with no overt s/s of dysphagia or aspiration. Recommend continuing diet of regular textures with thin liquids. Acknowledge that pt's mentation may continue to fluctuate, but he overall demonstrates excellent ability to effectively initiate swallow sequence. Strategies such as careful hand feeding, thorough oral care, and secretion management will continue to mitigate any risk as mentation fluctuates. No further SLP f/u is necessary. Will s/o at this time.     HPI HPI: Pt is an 88 yo male presenting 7/28 with concern for GIB. Pt was noted to have difficulty swallowing water in the ED and SLP swallow eval was ordered. CXR without significant acute changes. Pt is under hospice care for alzheimer's disease. PMH also includes: TIA, gait disorder, EGD with dilation      SLP Plan  All goals met      Recommendations for follow up therapy are one component of a multi-disciplinary discharge planning process, led by the attending physician.  Recommendations may be updated based on patient status, additional functional criteria and insurance authorization.    Recommendations  Diet recommendations: Regular;Thin liquid Liquids provided via: Cup;Straw Medication Administration: Crushed with puree Supervision: Staff to assist with self feeding;Full supervision/cueing for compensatory strategies Compensations: Minimize environmental distractions;Slow rate;Small sips/bites;Follow solids with liquid Postural Changes and/or Swallow Maneuvers:  Seated upright 90 degrees;Upright 30-60 min after meal                  Oral care BID   Frequent or constant Supervision/Assistance Dysphagia, oropharyngeal phase (R13.12)     All goals met     Gwynneth Aliment, M.A., CF-SLP Speech Language Pathology, Acute Rehabilitation Services  Secure Chat preferred [phone]   10/03/2022, 2:05 PM

## 2022-10-03 NOTE — Progress Notes (Addendum)
Ryan [name]  UJW:[id number] DOB: 1934-09-05 DOA: 09/28/2022 PCP: Patient, No Pcp Per    Brief Narrative:  88 year old SNF resident on hospice care with a history of dementia, chronic atrial fibrillation on Eliquis, GIB, and BPH who presented to the ER 7/28 with BRBPR.  Goals of Care:   Code Status: DNR   DVT prophylaxis: SCDs Start: 09/28/22 1017  Interim Hx: No acute events recorded overnight.  Afebrile.  Vital signs stable.  Hemoglobin increased as expected with PRBC transfusion yesterday morning.  Renal function essentially unchanged.  The patient is resting quietly in bed at the time of visit.  I returned to the unit later in the day to have a family meeting/conference call.  I explained to the patient's 3 daughters and his POA that based on the assessment of the GI PA that GI did not feel the patient was a candidate for endoscopic evaluation.  We discussed his current status, and the terminal nature of his Alzheimer's dementia again.  I explained that his bleeding may stop on its own, may continue in a slow fashion, or could recur aggressively and rapidly.  Nonetheless I explained that there was no appropriate intervention that we could offer that would in any way improve his quality of life.  I recommended that comfort focused care was the only appropriate intervention.    Assessment & Plan:  Acute blood loss anemia -bright red blood per rectum -suspected diverticular GIB Baseline hemoglobin 11.2 - hemoglobin 7.5 on presentation- clinically most consistent with a diverticular source - status post 3 units PRBC thus far -has been dosed with IV iron - Eliquis discontinued permanently - the GI team has evaluated - I will recheck a Hgb in the AM and discuss the new suggestions from Dr. Leonides Schanz with the family/POA further   Recent Labs  Lab 09/30/22 0047 09/30/22 1509 10/01/22 0335 10/02/22 0204 10/03/22 0446  HGB 7.2* 7.2* 7.1* 6.5* 7.5*    Acute kidney injury Due to blood  loss/hypoperfusion - creatinine stable today  Recent Labs  Lab 09/28/22 0311 09/29/22 0755 10/01/22 0335 10/03/22 0446  CREATININE 2.21* 1.96* 1.90* 1.87*    Alzheimer's dementia Mental status reportedly waxes and wanes -patient is wheelchair-bound at baseline -he follows me with his eyes but does not respond to any questions -clinically appears that his quality of life is exceedingly low -I have explained very clearly to the patient's family that this is a progressive terminal illness and that it appears he is approaching the final stages of this disease  HTN Blood pressure currently well-controlled  Chronic atrial fibrillation Eliquis is no longer appropriate in this patient given his life-threatening GI bleeding   Family Communication: Family meeting held today at 4 PM Disposition: to be determined    Objective: Blood pressure 104/68, pulse 95, temperature 98.3 F (36.8 C), resp. rate 16, height 5\' 9"  (1.753 m), weight 74 kg, SpO2 96%.  Intake/Output Summary (Last 24 hours) at 10/03/2022 0847 Last data filed at 10/03/2022 0600 Gross per 24 hour  Intake 1486.29 ml  Output 1900 ml  Net -413.71 ml   Filed Weights   09/28/22 0306 09/28/22 1620  Weight: 74.6 kg 74 kg    Examination: General: No acute respiratory distress Lungs: Clear to auscultation bilaterally  Cardiovascular: Regular rate and rhythm  Abdomen: Nontender, nondistended, soft, bowel sounds positive Extremities: No significant edema bilateral lower extremities  CBC: Recent Labs  Lab 09/28/22 0311 09/28/22 0736 10/01/22 0335 10/02/22 0204 10/03/22 0446  WBC 10.8*   < >  10.1 10.7* 11.4*  NEUTROABS 8.6*  --  7.7  --   --   HGB 7.5*   < > 7.1* 6.5* 7.5*  HCT 24.6*   < > 22.6* 21.0* 24.0*  MCV 79.6*   < > 83.7 84.3 83.9  PLT 229   < > 151 145* 156   < > = values in this interval not displayed.   Basic Metabolic Panel: Recent Labs  Lab 09/29/22 0755 10/01/22 0335 10/03/22 0446  NA 140 137 135   K 3.8 3.3* 3.2*  CL 110 108 105  CO2 22 20* 18*  GLUCOSE 108* 112* 111*  BUN 53* 53* 52*  CREATININE 1.96* 1.90* 1.87*  CALCIUM 8.0* 7.8* 7.8*  MG 2.3 2.2  --   PHOS 4.0 3.3  --    GFR: Estimated Creatinine Clearance: 28.9 mL/min (A) (by C-G formula based on SCr of 1.87 mg/dL (H)).   Scheduled Meds:  feeding supplement  237 mL Oral BID BM   Continuous Infusions:  ferric gluconate (FERRLECIT) IVPB 250 mg (10/02/22 0910)     LOS: 5 days   Lonia Blood, MD [facility] Office  [phone] Pager - Text Page per Loretha Stapler  If 7PM-7AM, please contact night-coverage per Amion 10/03/2022, 8:47 AM

## 2022-10-03 NOTE — Consult Note (Addendum)
Referring Provider: Dr. Sharon Seller Primary Care Physician:  Patient, No Pcp Per Primary Gastroenterologist:  Dr. Meridee Score  Reason for Consultation:  GIB  HPI: Ryan [name] is a 88 y.o. male who is a SNF resident on hospice care with a history of severe progressive dementia, chronic atrial fibrillation on Eliquis, GI bleed, and BPH who presented to the ER on 7/28 with hematochezia.  Hemoglobin has continued to bounce around down to 6.5 grams on a couple occasions.  Has received 2 units of packed red blood cells while he is here.  Family has related that patient's dementia has progressed to the point that he appears to have very poor quality of life.  Most of the time not able to communicate with family.  He is frequently incontinent of stool and urine.  His 3 daughters are interested in comfort care, but his POA who is a friend is requesting GI input.  Hgb 7.5 grams this AM after a unit of PRBCs yesterday, 8/1.  It looks like he had a GI bleed back in 01/2020 at which time we did proceed with EGD and flexible sigmoidoscopy.  Had a poor preparation of the colon even after extensive lavage, had hemorrhoids, had diverticulosis in the rectosigmoid colon and sigmoid colon.  Had normal mucosa in the entire examined colon with nonbleeding nonthrombosed internal and external hemorrhoids.  Suspected that hematochezia and anemia was likely due to diverticular in origin, but the entire left colon was not visualized.  Complete colonoscopy was deferred by family at that time.  EGD at that time was performed for complaints of dysphagia and GERD.  Was found to have grade A esophagitis with a widely patent nonobstructing Schatzki's ring.  Esophagus was dilated esophagus was biopsied.  Also had a 4 cm hiatal hernia.  Erythematous mucosa in the stomach.  FINAL MICROSCOPIC DIAGNOSIS:   A. DUODENUM, BIOPSY:  - Duodenal mucosa with no significant pathologic findings.  - Negative for increased intraepithelial  lymphocytes and villous  architectural changes.   B. STOMACH, BIOPSY:  - Mild chronic gastritis.  Mild reactive gastropathy.  - Warthin-Starry stain is negative for Helicobacter pylori.   C. ESOPHAGUS, BIOPSY:  - Squamous esophageal epithelium with no significant pathologic  findings.  - Negative for increased intraepithelial eosinophils.   D. COLON, LEFT, BIOPSY:  - Colonic mucosa with no significant pathologic findings.  - Negative for active inflammation and other abnormalities.   No family was present at bedside at the time of my visit.  The patient was unable to provide me with any information.  Patient is sitting up in bed with head slumped over but did make eye contact with me for a few seconds.  When asked if he had any abdominal pain he told me no.  He thought that he was at home.  Answered a couple of questions, but did not respond to others.  Past Medical History:  Diagnosis Date   Alzheimer disease (HCC)    Benign prostatic hyperplasia 01/30/2020   Chronic anticoagulation    On eliquis   Dementia (HCC)    Gait disorder    GI bleed    Hypercholesterolemia    Hypertension    Mixed hyperlipidemia 01/30/2020   Paroxysmal atrial fibrillation (HCC)    TIA (transient ischemic attack) 01/2021    Past Surgical History:  Procedure Laterality Date   BIOPSY  02/05/2020   Procedure: BIOPSY;  Surgeon: Lemar Lofty., MD;  Location: [facility] ENDOSCOPY;  Service: Gastroenterology;;   [place]  ESOPHAGOGASTRODUODENOSCOPY (EGD) WITH PROPOFOL N/A 02/05/2020   Procedure: ESOPHAGOGASTRODUODENOSCOPY (EGD) WITH PROPOFOL;  Surgeon: Meridee Score Netty Starring., MD;  Location[facility] ENDOSCOPY;  Service: Gastroenterology;  Laterality: N/A;   FLEXIBLE SIGMOIDOSCOPY N/A 02/05/2020   Procedure: FLEXIBLE SIGMOIDOSCOPY;  Surgeon: Meridee Score Netty Starring., MD;  Location[facility] ENDOSCOPY;  Service: Gastroenterology;  Laterality: N/A;   SAVORY DILATION N/A 02/05/2020   Procedure: SAVORY DILATION;   Surgeon: Meridee Score Netty Starring., MD;  Location: [facility] ENDOSCOPY;  Service: Gastroenterology;  Laterality: N/A;   TOTAL HIP ARTHROPLASTY      Prior to Admission medications   Medication Sig Start Date End Date Taking? Authorizing Provider  acetaminophen (TYLENOL) 325 MG tablet Take 650 mg by mouth every 6 (six) hours as needed for moderate pain or fever.   Yes [provider]  Emollient H[place] Dyer) LOTN Apply 1 Application topically daily. To legs   Yes [provider]  furosemide (LASIX) 40 MG tablet Take 40 mg by mouth daily.   Yes [provider]  guaifenesin (ROBITUSSIN) 100 MG/5ML syrup Take 200 mg by mouth 2 (two) times daily as needed for cough.   Yes [provider]  LORazepam (ATIVAN) 0.5 MG tablet Take 0.25 mg by mouth 2 (two) times daily as needed for anxiety.   Yes [provider]  metoprolol tartrate (LOPRESSOR) 25 MG tablet Take 1 tablet (25 mg total) by mouth 2 (two) times daily. 01/17/21 09/28/22 Yes Ghimire, Werner Lean, MD  Olopatadine HCl 0.2 % SOLN Apply 1 drop to eye as needed (Redness/Watery eye).   Yes [provider]  polyethylene glycol (MIRALAX / GLYCOLAX) 17 g packet Take 17 g by mouth daily as needed (for constipation- mix into 4 to 8 ounces of fluid). 02/14/20  Yes [provider]  potassium chloride (KLOR-CON) 10 MEQ tablet Take 20 mEq by mouth daily.   Yes [provider]  senna (SENOKOT) 8.6 MG TABS tablet Take 2 tablets by mouth daily.   Yes [provider]  Skin Protectants, Misc. (BAZA PROTECT EX) Apply 1 Application topically as needed (Irritation).   Yes [provider]    Current Facility-Administered Medications  Medication Dose Route Frequency Provider Last Rate Last Admin   acetaminophen (TYLENOL) tablet 650 mg  650 mg Oral Q6H PRN Crosley, Debby, MD       feeding supplement (ENSURE ENLIVE / ENSURE PLUS) liquid 237 mL  237 mL Oral BID BM Zigmund Daniel., MD   237  mL at 10/02/22 1522   ferric gluconate (FERRLECIT) 250 mg in sodium chloride 0.9 % 250 mL IVPB  250 mg Intravenous Daily Zigmund Daniel., MD 135 mL/hr at 10/02/22 0910 250 mg at 10/02/22 0910   LORazepam (ATIVAN) tablet 0.25 mg  0.25 mg Oral BID PRN Zigmund Daniel., MD   0.25 mg at 10/03/22 0059    Allergies as of 09/28/2022   (No Known Allergies)    History reviewed. No pertinent family history.  Social History   Socioeconomic History   Marital status: Widowed    Spouse name: Not on file   Number of children: 3   Years of education: Not on file   Highest education level: High school graduate  Occupational History    Comment: retired  Tobacco Use   Smoking status: Never   Smokeless tobacco: Never   Tobacco comments:    maybe in 20's  Substance and Sexual Activity   Alcohol use: Not Currently   Drug use: Never   Sexual  activity: Not on file  Other Topics Concern   Not on file  Social History Narrative   03/18/21 lives at [facility]   Social Determinants of Health   Financial Resource Strain: Not on file  Food Insecurity: No Food Insecurity (09/28/2022)   Hunger Vital Sign    Worried About Running Out of Food in the Last Year: Never true    Ran Out of Food in the Last Year: Never true  Transportation Needs: No Transportation Needs (09/28/2022)   PRAPARE - Administrator, Civil Service (Medical): No    Lack of Transportation (Non-Medical): No  Physical Activity: Not on file  Stress: Not on file  Social Connections: Not on file  Intimate Partner Violence: Not At Risk (09/28/2022)   Humiliation, Afraid, Rape, and Kick questionnaire    Fear of Current or Ex-Partner: No    Emotionally Abused: No    Physically Abused: No    Sexually Abused: No    Review of Systems: ROS is O/W negative except as mentioned in HPI.  Physical Exam: Vital signs in last 24 hours: Temp:  [98.1 F (36.7 C)-99 F (37.2 C)] 98.1 F (36.7 C) (08/02 0900) Pulse  Rate:  [92-102] 92 (08/02 0900) Resp:  [16] 16 (08/02 0428) BP: (104-116)/(64-71) 110/70 (08/02 0900) SpO2:  [96 %-98 %] 98 % (08/02 0900) Last BM Date : 10/02/22 General:  Alert, elderly and frail, in NAD Head:  Normocephalic and atraumatic. Eyes:  Sclera clear, no icterus.  Conjunctiva pink. Ears:  Normal auditory acuity. Mouth:  No deformity or lesions.   Lungs:  Clear throughout to auscultation.  No wheezes, crackles, or rhonchi.  Heart:  Regular rate and rhythm; no murmurs, clicks, rubs, or gallops. Abdomen:  Soft, non-distended.  BS present.  Non-tender.   Msk:  Symmetrical without gross deformities. Pulses:  Normal pulses noted. Extremities:  Without clubbing or edema. Neurologic:  Alert, not oriented to place or time. Skin:  Intact without significant lesions or rashes.  Intake/Output from previous day: 08/01 0701 - 08/02 0700 In: 1790.3 [P.O.:1200; Blood:304; IV Piggyback:286.3] Out: 1900 [Urine:1900]  Lab Results: Recent Labs    10/01/22 0335 10/02/22 0204 10/03/22 0446  WBC 10.1 10.7* 11.4*  HGB 7.1* 6.5* 7.5*  HCT 22.6* 21.0* 24.0*  PLT 151 145* 156   BMET Recent Labs    10/01/22 0335 10/03/22 0446  NA 137 135  K 3.3* 3.2*  CL 108 105  CO2 20* 18*  GLUCOSE 112* 111*  BUN 53* 52*  CREATININE 1.90* 1.87*  CALCIUM 7.8* 7.8*   LFT Recent Labs    10/01/22 0335  PROT 5.8*  ALBUMIN 2.4*  AST 12*  ALT 11  ALKPHOS 48  BILITOT 0.5   Studies/Results: DG Swallowing Func-Speech Pathology  Result Date: 10/02/2022 Table formatting from the original result was not included. Modified Barium Swallow Study Patient Details Name: Ryan [name] MRN: [id number] Date of Birth: 08/13/1934 Today's Date: 10/02/2022 HPI/PMH: HPI: Pt is an 88 yo male presenting 7/28 with concern for GIB. Pt was noted to have difficulty swallowing water in the ED and SLP swallow eval was ordered. CXR without significant acute changes. Pt is under hospice care for alzheimer's disease. PMH also  includes: TIA, gait disorder, EGD with dilation Clinical Impression: Clinical Impression: Pt presents with a mild oropharygneal dyspahgia, but exhibits excellent ability to protect his airway. He has trace to moderate residue present with liquid consitencies along his tongue, palate, base of tongue, and valleculae. This  has the ability to continue progressing posteriorly after the swallow, but pt demonstrated good awareness and sensation as he initiated sequential swallows independently to manage these residuals. Pt has adequate laryngeal vestibule with no penetration/aspiration of any consistency administered. He efficiently and thoroughly masticated solids without oral residue. The pill was given with thin liquids and was then promptly and completely cleared during the esophageal sweep. Recommend a regular texture diet with thin liquids. Education provided to pt and his daughter, [name], following MBS. Reinforced use of aspiration precautions (thorough oral care, sitting upright, small bites/sips) and ordering managable meals. SLP will f/u x1 for reinforcement of education with pt and his daughters. Factors that may increase risk of adverse event in presence of aspiration Rubye Oaks & Clearance Coots 2021): Factors that may increase risk of adverse event in presence of aspiration Rubye Oaks & Clearance Coots 2021): Reduced cognitive function; Respiratory or GI disease; Limited mobility Recommendations/Plan: Swallowing Evaluation Recommendations Swallowing Evaluation Recommendations Recommendations: PO diet PO Diet Recommendation: Regular; Thin liquids (Level 0) Liquid Administration via: Cup; Straw Medication Administration: Whole meds with liquid Supervision: Staff to assist with self-feeding; Set-up assistance for safety Swallowing strategies  : Slow rate; Small bites/sips; Minimize environmental distractions Postural changes: Position pt fully upright for meals; Stay upright 30-60 min after meals Oral care recommendations: Oral care  BID (2x/day) Treatment Plan Treatment Plan Treatment recommendations: Therapy as outlined in treatment plan below Follow-up recommendations: No SLP follow up Functional status assessment: Patient has had a recent decline in their functional status and demonstrates the ability to make significant improvements in function in a reasonable and predictable amount of time. Treatment frequency: Min 2x/week Treatment duration: 1 week Interventions: Aspiration precaution training; Patient/family education Recommendations Recommendations for follow up therapy are one component of a multi-disciplinary discharge planning process, led by the attending physician.  Recommendations may be updated based on patient status, additional functional criteria and insurance authorization. Assessment: Orofacial Exam: Orofacial Exam Oral Cavity: Oral Hygiene: WFL Oral Cavity - Dentition: Dentures, top (natural lower dentition) Orofacial Anatomy: WFL Oral Motor/Sensory Function: WFL Anatomy: Anatomy: Suspected cervical osteophytes Boluses Administered: Boluses Administered Boluses Administered: Thin liquids (Level 0); Mildly thick liquids (Level 2, nectar thick); Moderately thick liquids (Level 3, honey thick); Puree; Solid  Oral Impairment Domain: Oral Impairment Domain Lip Closure: No labial escape Tongue control during bolus hold: Not tested Bolus preparation/mastication: Timely and efficient chewing and mashing Bolus transport/lingual motion: Brisk tongue motion Oral residue: Residue collection on oral structures Location of oral residue : Tongue; Palate Initiation of pharyngeal swallow : Posterior angle of the ramus  Pharyngeal Impairment Domain: Pharyngeal Impairment Domain Soft palate elevation: No bolus between soft palate (SP)/pharyngeal wall (PW) Laryngeal elevation: Complete superior movement of thyroid cartilage with complete approximation of arytenoids to epiglottic petiole Anterior hyoid excursion: Complete anterior movement  Epiglottic movement: Complete inversion Laryngeal vestibule closure: Complete, no air/contrast in laryngeal vestibule Pharyngeal stripping wave : Present - complete Pharyngeal contraction (A/P view only): N/A Pharyngoesophageal segment opening: Complete distension and complete duration, no obstruction of flow Tongue base retraction: Wide column of contrast or air between tongue base and PPW Pharyngeal residue: Trace residue within or on pharyngeal structures Location of pharyngeal residue: Valleculae; Tongue base; Pharyngeal wall  Esophageal Impairment Domain: Esophageal Impairment Domain Esophageal clearance upright position: Complete clearance, esophageal coating Pill: Pill Consistency administered: Thin liquids (Level 0) Thin liquids (Level 0): [facility] Penetration/Aspiration Scale Score: Penetration/Aspiration Scale Score 1.  Material does not enter airway: Thin liquids (Level 0); Mildly thick liquids (Level 2,  nectar thick); Moderately thick liquids (Level 3, honey thick); Puree; Solid; Pill Compensatory Strategies: Compensatory Strategies Compensatory strategies: Yes Straw: Effective Effective Straw: Thin liquid (Level 0); Mildly thick liquid (Level 2, nectar thick)   General Information: Caregiver present: Yes  Diet Prior to this Study: Dysphagia 1 (pureed); Thin liquids (Level 0)   Temperature : Normal   Respiratory Status: WFL   Supplemental O2: None (Room air)   History of Recent Intubation: No  Behavior/Cognition: Alert; Cooperative; Requires cueing Self-Feeding Abilities: Needs assist with self-feeding Baseline vocal quality/speech: Normal Volitional Cough: Able to elicit Volitional Swallow: Able to elicit Exam Limitations: No limitations Goal Planning: Prognosis for improved oropharyngeal function: Good Barriers to Reach Goals: Cognitive deficits No data recorded Patient/Family Stated Goal: family wants him to eat and drink as carefully as possible Consulted and agree with results and recommendations:  Patient; Family member/caregiver Pain: Pain Assessment Pain Assessment: No/denies pain Breathing: 0 Negative Vocalization: 0 Facial Expression: 0 Body Language: 0 Consolability: 0 PAINAD Score: 0 End of Session: Start Time:SLP Start Time (ACUTE ONLY): 1191 Stop Time: SLP Stop Time (ACUTE ONLY): 0905 Time Calculation:SLP Time Calculation (min) (ACUTE ONLY): 30 min Charges: SLP Evaluations $ SLP Speech Visit: 1 Visit SLP Evaluations $MBS Swallow: 1 Procedure $Swallowing Treatment: 1 Procedure SLP visit diagnosis: SLP Visit Diagnosis: Dysphagia, oropharyngeal phase (R13.12) Past Medical History: Past Medical History: Diagnosis Date  Alzheimer disease (HCC)   Benign prostatic hyperplasia 01/30/2020  Chronic anticoagulation   On eliquis  Dementia (HCC)   Gait disorder   GI bleed   Hypercholesterolemia   Hypertension   Mixed hyperlipidemia 01/30/2020  Paroxysmal atrial fibrillation (HCC)   TIA (transient ischemic attack) 01/2021 Past Surgical History: Past Surgical History: Procedure Laterality Date  BIOPSY  02/05/2020  Procedure: BIOPSY;  Surgeon: Lemar Lofty., MD;  Location: [facility] ENDOSCOPY;  Service: Gastroenterology;;  CHOLECYSTECTOMY    ESOPHAGOGASTRODUODENOSCOPY (EGD) WITH PROPOFOL N/A 02/05/2020  Procedure: ESOPHAGOGASTRODUODENOSCOPY (EGD) WITH PROPOFOL;  Surgeon: Lemar Lofty., MD;  Location: [facility] ENDOSCOPY;  Service: Gastroenterology;  Laterality: N/A;  FLEXIBLE SIGMOIDOSCOPY N/A 02/05/2020  Procedure: FLEXIBLE SIGMOIDOSCOPY;  Surgeon: Meridee Score Netty Starring., MD;  Location: [facility] ENDOSCOPY;  Service: Gastroenterology;  Laterality: N/A;  SAVORY DILATION N/A 02/05/2020  Procedure: SAVORY DILATION;  Surgeon: Meridee Score Netty Starring., MD;  Location: [facility] ENDOSCOPY;  Service: Gastroenterology;  Laterality: N/A;  TOTAL HIP ARTHROPLASTY   Gwynneth Aliment, M.A., CF-SLP Speech Language Pathology, Acute Rehabilitation Services Secure Chat preferred [phone] 10/02/2022, 9:30 AM   IMPRESSION:  *88 year old male with  advanced Alzheimer's dementia who is here with GI bleeding.  Suspected to be diverticular in origin.  He had a similar presentation in 01/2020 at which time he did undergo EGD and flexible sigmoidoscopy.  Sigmoidoscopy revealed hemorrhoids and diverticulosis, no bleeding source identified, but prep was poor.  Family declined full colonoscopy at that time.  He is here now with similar presentation.  Now, 3 years later and more debilitated/cognitively declined and is a SNF resident on hospice care.  His 3 daughters have expressed interest in comfort care, but his POA who is a friend is asking for GI input. *Chronic anticoagulation on Eliquis, which is currently on hold.  PLAN: -Agree that comfort care is appropriate for this patient, no plans for any procedures at this time.  Patient would be very high risk for procedure.  If he would be very hard on the patient to put him through colonoscopy.  He would likely not be able to drink prep and would require NG  tube for prepping.  All of this for likely very limited benefit.   Princella Pellegrini. Keyunna Coco  10/03/2022, 9:41 AM

## 2022-10-03 NOTE — Progress Notes (Signed)
[facility] Liaison Note- Hospitalized Hospice Patient    Ryan [name] is a current hospice patient followed at [facility] for terminal diagnosis Alzheimer's. Patient was taken to the ED for evaluation of complaint of dark red blood from patient's rectum for 1-2 days. [facility] was not notified or contacted by the facility or family. Received notification of patient status by Dr. [name] at [facility] ED. Patient admitted to [facility] 7.28.24 with GI Bleed. Per Dr. Kirt Boys with [facility], this is a related hospital admission.   Exchanged report with [facility] Nurse before visiting the patient at bedside with no visitors in the room. Patient sitting up at 45 degrees in bed in NAD, awakened with verbal stimuli and answered yes/no questions without opening eyes or engaging in conversation. Patient denied complaints and had a half-eaten banana on his bedside tray. Bedside RN was administering Iron IVPB to patient as I left the floor.  Spoke to the [facility] who stated that the plan was to have a possible GI Consult today per request of the POA with continuing GOC discussions.  Patient remains at an inpatient level of care due to need for skilled frequent assessment and treatment with blood transfusions, IV medications (IRON) infusions,  and monitoring of further bleeding issues.   V/S: 98.1, 92, 16, 110/70, 98% sats on RA  I/O: 1790.05/1898 (-109.7)   Abnormal Labs:  WBC 11.4, RBC 2.86, Hemo 7.5, HCT 24.0, K 3.2, CO2 18, BUN 52, Create 1.87, Glucose 111    Diagnostics:  None on 8.2.24   IV/PRN:  Ferrlecit 250mg  in NS IVPB adm at 1000 on 10/03/22 Ativan 0.25mg  tablet PO adm at 0059 prn on 10/03/22   EPIC MD Problem List:   Most recent Hospitalist Note: Acute blood loss anemia -bright red blood per rectum -suspected diverticular GIB Baseline hemoglobin 11.2 -hemoglobin 7.5 on presentation-clinically most consistent with a diverticular source  -status post 4 units PRBC thus far -has been dosed with IV iron -Eliquis discontinued permanently - I will ask GI to comment on his case tomorrow as per the wishes of the family  Acute kidney injury: Due to blood loss/hypoperfusion - creatinine slowly improving Alzheimer's dementia: Mental status reportedly waxes and wanes -patient is wheelchair-bound at baseline -he follows me with his eyes but does not respond to any questions -clinically appears that his quality of life is exceedingly low -I have explained very clearly to the patient's family that this is a progressive terminal illness and that it appears he is approaching the final stages of this disease  HTN: Blood pressure currently well-controlled Chronic atrial fibrillation: Eliquis is no longer appropriate in this patient given his life-threatening GI bleeding Disposition: Will depend upon cessation of bleeding/medical stability and goals of care   GI Consult EPIC Notes: IMPRESSION:  *88 year old male with advanced Alzheimer's dementia who is here with GI bleeding.  Suspected to be diverticular in origin.  He had a similar presentation in 01/2020 at which time he did undergo EGD and flexible sigmoidoscopy.  Sigmoidoscopy revealed hemorrhoids and diverticulosis, no bleeding source identified, but prep was poor.  Family declined full colonoscopy at that time.  He is here now with similar presentation.  Now, 3 years later and more debilitated/cognitively declined and is a SNF resident on hospice care.  His 3 daughters have expressed interest in comfort care, but his POA who is a friend is asking for GI input. *Chronic anticoagulation on Eliquis, which is currently on hold. PLAN: -Agree  that comfort care is appropriate for this patient, no plans for any procedures at this time.  Discharge Planning: Ongoing Assessment: Family confirms wishes to continue support of hospice as discharge plan evolves. Please use [facility] for hospice patients  transportation at discharge.  Goals of Care: DNR, GOC discussions continuing with family and legal guardian.  Family Contact:  Spoke to patient's daughter Ralph Leyden to emotionally support who is aware that Civil engineer, contracting will continue to follow patient daily during this hospitalization. Family has Academic librarian and encouraged to call for further hospice support.   IDT: Updated [facility] Team    Please call with any hospice related questions or concerns,   Roda Shutters, RN [facility] Liaison [phone]

## 2022-10-03 NOTE — TOC CM/SW Note (Signed)
Transition of Care [facility]) - Inpatient Brief Assessment   Patient Details  Name: Ryan [name] MRN: [id number] Date of Birth: Feb 02, 1935  Transition of Care [facility]) CM/SW Contact:    Tom-Johnson, Hershal Coria, RN Phone Number: 10/03/2022, 2:24 PM   Clinical Narrative:  Patient presented to the ED with dark red blood from Rectum. Patient from [facility] with Hospice Care by [facility]. Patient has received 4 U PRBC thus far this admission with IV Iron infusion. Hgb today at 7.5. GOC conversation continues with family. GI following.   CM will continue to follow as patient progresses with care towards discharge.       Transition of Care Asessment: Insurance and Status: Insurance coverage has been reviewed Patient has primary care physician: Yes Home environment has been reviewed: From [place] Prior level of function:: Memory Care Prior/Current Home Services: Current home services Social Determinants of Health Reivew: SDOH reviewed no interventions necessary Readmission risk has been reviewed: Yes Transition of care needs: transition of care needs identified, TOC will continue to follow

## 2022-10-04 DIAGNOSIS — K922 Gastrointestinal hemorrhage, unspecified: Secondary | ICD-10-CM

## 2022-10-04 DIAGNOSIS — I482 Chronic atrial fibrillation, unspecified: Secondary | ICD-10-CM | POA: Diagnosis not present

## 2022-10-04 DIAGNOSIS — F0280 Dementia in other diseases classified elsewhere without behavioral disturbance: Secondary | ICD-10-CM

## 2022-10-04 DIAGNOSIS — K921 Melena: Secondary | ICD-10-CM

## 2022-10-04 DIAGNOSIS — Z7901 Long term (current) use of anticoagulants: Secondary | ICD-10-CM | POA: Diagnosis not present

## 2022-10-04 DIAGNOSIS — K625 Hemorrhage of anus and rectum: Secondary | ICD-10-CM | POA: Diagnosis not present

## 2022-10-04 NOTE — Progress Notes (Addendum)
[facility] Liaison Note- hospitalized hospice patient   Ryan [name] is a current hospice patient followed at [facility] for terminal diagnosis Alzheimer's. Patient was taken to the ED for evaluation of complaint of dark red blood from patient's rectum for 1-2 days. [facility] was not notified or contacted by the facility or family. Received notification of patient status by Dr. [name] at [facility] ED. Patient admitted to [facility] 7.28.24 with GI Bleed. Per Dr. Kirt Boys with [facility], this is a related hospital admission.   Exchanged report with patient's MD, at this point patient continues to decline. However, GI was consulted per family/HCPOA request. A plan has not been established at this time. Patient's HCPOA has stated that he would want everything done. The plan is to continue to assess and follow.   Patient remains inpatient due to the need for low hemoglobin, need for skilled nursing assessments, and blood transfusions.   V/S: 98.4, 84, 109/63, 99% on RA   I/O:  808.7 ml via IV fluids, 730 ml via blood, 2, 220 ml PO/ 7, 200 urine    Abnormal Labs:  BASIC METABOLIC PANEL  Rpt !  Sodium 135 - 145 mmol/L 133 (L)  CO2 22 - 32 mmol/L 19 (L)  Glucose 70 - 99 mg/dL 518 (H)  BUN 8 - 23 mg/dL 52 (H)  Creatinine 8.41 - 1.24 mg/dL 6.60 (H)  Calcium 8.9 - 10.3 mg/dL 8.0 (L)  GFR, Estimated >60 mL/min 35 (L)  RBC 4.22 - 5.81 MIL/uL 2.82 (L)  Hemoglobin 13.0 - 17.0 g/dL 7.5 (L)  HCT 63.0 - 16.0 % 23.6 (L)  RDW 11.5 - 15.5 % 19.9 (H)    Diagnostics: none since 8.1    IV/PRN: none today, but received blood transfusion yesterday    Problem List: Acute blood loss anemia -bright red blood per rectum -suspected diverticular GIB Baseline hemoglobin 11.2 - hemoglobin 7.5 on presentation- clinically most consistent with a diverticular source - status post 3 units PRBC thus far -has been dosed with IV iron - Eliquis discontinued  permanently - the GI team has evaluated - I will recheck a Hgb in the AM and discuss the new suggestions from Dr. Leonides Schanz with the family/POA further    Last Labs         Recent Labs  Lab 09/30/22 0047 09/30/22 1509 10/01/22 0335 10/02/22 0204 10/03/22 0446  HGB 7.2* 7.2* 7.1* 6.5* 7.5*        Acute kidney injury Due to blood loss/hypoperfusion - creatinine stable today   Last Labs        Recent Labs  Lab 09/28/22 0311 09/29/22 0755 10/01/22 0335 10/03/22 0446  CREATININE 2.21* 1.96* 1.90* 1.87*        Alzheimer's dementia Mental status reportedly waxes and wanes -patient is wheelchair-bound at baseline -he follows me with his eyes but does not respond to any questions -clinically appears that his quality of life is exceedingly low -I have explained very clearly to the patient's family that this is a progressive terminal illness and that it appears he is approaching the final stages of this disease   HTN Blood pressure currently well-controlled   Chronic atrial fibrillation Eliquis is no longer appropriate in this patient given his life-threatening GI bleeding   Discharge Planning: ongoing    Family Contact: POA updated    IDT: Updated   Goals of Care: DNR, ongoing   Please use [facility] for all hospice patients transportation.   Please call  with any questions or concerns. Thank you  Dionicio Stall, [facility] Liaison [phone]

## 2022-10-04 NOTE — Progress Notes (Signed)
Ryan [name]  DUK:[id number] DOB: 1935-01-12 DOA: 09/28/2022 PCP: Patient, No Pcp Per    Brief Narrative:  88 year old SNF resident on hospice care with a history of dementia, chronic atrial fibrillation on Eliquis, GIB, and BPH who presented to the ER 7/28 with BRBPR.  Goals of Care:   Code Status: DNR   DVT prophylaxis: comfort focused care   Interim Hx: Hemoglobin has been stable over the last 48 hours.  Afebrile.  Vital signs stable.  Laying in bed minimally responsive.  Appears very weak and withdrawn.  No evidence of discomfort or anxiety.  Assessment & Plan:  Acute blood loss anemia -bright red blood per rectum -suspected diverticular GIB Baseline hemoglobin 11.2 - hemoglobin 7.5 on presentation- clinically most consistent with a diverticular source - status post 3 units PRBC thus far -has been dosed with IV iron - Eliquis discontinued permanently - the GI team has evaluated and stated that in general they agree the patient appears to have a poor quality of life and would most likely benefit from being transitioned to comfort care, but at the same time has offered colonoscopy/aggressive evaluation if that is what the POA desires -I have discussed the case at length with the patient's POA as well as his 3 daughters and we all agree that further aggressive intervention is not consistent with what is appropriate for the patient or his own previously stated wishes -we are transitioning to a comfort focused approach -the patient should not be hospitalized in the future should he experience recurrent bleeding events, and the family/POA is aware that this may very well occur -we will check no further blood work and will instead focus on ensuring the patient is comfortable and without anxiety -the POA/family is interested in [facility] and therefore I have asked the hospice team to evaluate him for eligibility  Recent Labs  Lab 09/30/22 1509 10/01/22 0335 10/02/22 0204 10/03/22 0446  10/04/22 0158  HGB 7.2* 7.1* 6.5* 7.5* 7.5*    Acute kidney injury Due to blood loss/hypoperfusion   Recent Labs  Lab 09/28/22 0311 09/29/22 0755 10/01/22 0335 10/03/22 0446 10/04/22 0158  CREATININE 2.21* 1.96* 1.90* 1.87* 1.85*    Alzheimer's dementia Mental status reportedly waxes and wanes -patient is wheelchair-bound at baseline -he follows me with his eyes but does not respond to any questions -clinically appears that his quality of life is exceedingly low -I have explained very clearly to the patient's family that this is a progressive terminal illness and that it appears he is approaching the final stages of this disease -family confirms the patient is been eating very little for many weeks now and is consistently losing weight -it appears that his life expectancy is quite limited at this time  HTN Blood pressure currently well-controlled  Chronic atrial fibrillation Eliquis is no longer appropriate in this patient given his life-threatening GI bleeding   Family Communication: Family meeting held again today at 4 PM including POA and 3 daughters Disposition: to be determined    Objective: Blood pressure 109/63, pulse 84, temperature 98.4 F (36.9 C), temperature source Oral, resp. rate 18, height 5\' 9"  (1.753 m), weight 74 kg, SpO2 99%.  Intake/Output Summary (Last 24 hours) at 10/04/2022 0938 Last data filed at 10/04/2022 0630 Gross per 24 hour  Intake 60 ml  Output 1350 ml  Net -1290 ml   Filed Weights   09/28/22 0306 09/28/22 1620  Weight: 74.6 kg 74 kg    Examination: General: No acute respiratory  distress Lungs: Clear to auscultation bilaterally -poor air movement bilateral bases with poor inspiratory effort Cardiovascular: Regular rate and rhythm  Abdomen: Nontender, nondistended, soft, bowel sounds positive Extremities: No significant edema B LE -muscular wasting appreciable all extremities  CBC: Recent Labs  Lab 09/28/22 0311 09/28/22 0736  10/01/22 0335 10/02/22 0204 10/03/22 0446 10/04/22 0158  WBC 10.8*   < > 10.1 10.7* 11.4* 10.5  NEUTROABS 8.6*  --  7.7  --   --   --   HGB 7.5*   < > 7.1* 6.5* 7.5* 7.5*  HCT 24.6*   < > 22.6* 21.0* 24.0* 23.6*  MCV 79.6*   < > 83.7 84.3 83.9 83.7  PLT 229   < > 151 145* 156 163   < > = values in this interval not displayed.   Basic Metabolic Panel: Recent Labs  Lab 09/29/22 0755 10/01/22 0335 10/03/22 0446 10/04/22 0158  NA 140 137 135 133*  K 3.8 3.3* 3.2* 3.5  CL 110 108 105 106  CO2 22 20* 18* 19*  GLUCOSE 108* 112* 111* 120*  BUN 53* 53* 52* 52*  CREATININE 1.96* 1.90* 1.87* 1.85*  CALCIUM 8.0* 7.8* 7.8* 8.0*  MG 2.3 2.2  --   --   PHOS 4.0 3.3  --   --    GFR: Estimated Creatinine Clearance: 29.2 mL/min (A) (by C-G formula based on SCr of 1.85 mg/dL (H)).    LOS: 6 days   Lonia Blood, MD [facility] Office  [phone] Pager - Text Page per Amion  If 7PM-7AM, please contact night-coverage per Amion 10/04/2022, 9:38 AM

## 2022-10-04 NOTE — Progress Notes (Signed)
    Gastroenterology Inpatient Follow Up    Subjective: Met daughter at bedside.  Patient has been able to eat.  Per nurse patient had some blood in stools overnight but this did not appear to be frank blood or blood clots.  Objective: Vital signs in last 24 hours: Temp:  [98.4 F (36.9 C)] 98.4 F (36.9 C) (08/02 2027) Pulse Rate:  [84-96] 84 (08/03 0820) Resp:  [18] 18 (08/02 2027) BP: (109-118)/(63-67) 109/63 (08/03 0820) SpO2:  [97 %-99 %] 99 % (08/03 0820) Last BM Date : 10/03/22  Intake/Output from previous day: 08/02 0701 - 08/03 0700 In: 60 [P.O.:60] Out: 1350 [Urine:1350] Intake/Output this shift: Total I/O In: 120 [P.O.:120] Out: -   General appearance: alert Resp: No increased work of breathing Cardio: Regular rate GI: Nontender, nondistended  Lab Results: Recent Labs    10/02/22 0204 10/03/22 0446 10/04/22 0158  WBC 10.7* 11.4* 10.5  HGB 6.5* 7.5* 7.5*  HCT 21.0* 24.0* 23.6*  PLT 145* 156 163   BMET Recent Labs    10/03/22 0446 10/04/22 0158  NA 135 133*  K 3.2* 3.5  CL 105 106  CO2 18* 19*  GLUCOSE 111* 120*  BUN 52* 52*  CREATININE 1.87* 1.85*  CALCIUM 7.8* 8.0*   LFT No results for input(s): "PROT", "ALBUMIN", "AST", "ALT", "ALKPHOS", "BILITOT", "BILIDIR", "IBILI" in the last 72 hours. PT/INR No results for input(s): "LABPROT", "INR" in the last 72 hours. Hepatitis Panel No results for input(s): "HEPBSAG", "HCVAB", "HEPAIGM", "HEPBIGM" in the last 72 hours. C-Diff No results for input(s): "CDIFFTOX" in the last 72 hours.  Studies/Results: No results found.  Medications: I have reviewed the patient's current medications. Scheduled: Continuous: OAC:[id number], LORazepam, prochlorperazine  Assessment/Plan: 88 year old male with history of severe dementia, atrial fibrillation on Eliquis, and BPH presented with hematochezia.  Hemoglobin from yesterday today has remained completely stable.  This suggest to me that the patient's  bleeding may be resolving on its own.  Recommend continuing to trend hemoglobin over time.  If patient's hemoglobin remains stable, then no further evaluation would be necessary at this time.   LOS: 6 days   Imogene Burn 10/04/2022, 1:12 PM

## 2022-10-04 NOTE — Plan of Care (Signed)
  Problem: Health Behavior/Discharge Planning: Goal: Ability to manage health-related needs will improve Outcome: Progressing   

## 2022-10-05 DIAGNOSIS — K625 Hemorrhage of anus and rectum: Secondary | ICD-10-CM | POA: Diagnosis not present

## 2022-10-05 NOTE — Plan of Care (Signed)
  Problem: Health Behavior/Discharge Planning: Goal: Ability to manage health-related needs will improve Outcome: Progressing   

## 2022-10-05 NOTE — Progress Notes (Signed)
Ryan [name]  VHQ:[id number] DOB: 1934/06/15 DOA: 09/28/2022 PCP: Patient, No Pcp Per    Brief Narrative:  88 year old SNF resident on hospice care with a history of dementia, chronic atrial fibrillation on Eliquis, GIB, and BPH who presented to the ER 7/28 with BRBPR.  Goals of Care:   Code Status: DNR   DVT prophylaxis: comfort focused care   Interim Hx: No evidence of discomfort or uncontrolled anxiety.  The patient is somewhat somnolent today and has shown less interest in eating per his daughter.  Assessment & Plan:  Acute blood loss anemia -bright red blood per rectum -suspected diverticular GIB Baseline hemoglobin 11.2 - hemoglobin 7.5 on presentation- clinically most consistent with a diverticular source - status post 3 units PRBC thus far -has been dosed with IV iron - Eliquis discontinued permanently - the GI team has evaluated and stated that in general they agree the patient appears to have a poor quality of life and would most likely benefit from being transitioned to comfort care, but at the same time has offered colonoscopy/aggressive evaluation if that is what the POA desires -I have discussed the case at length with the patient's POA as well as his 3 daughters and we all agree that further aggressive intervention is not consistent with what is appropriate for the patient or his own previously stated wishes -we are transitioning to a comfort focused approach -the patient should not be hospitalized in the future should he experience recurrent bleeding events, and the family/POA is aware that this may very well occur -we will check no further blood work and will instead focus on ensuring the patient is comfortable and without anxiety -the POA/family is interested in [employer] and therefore I have asked the hospice team to evaluate him for eligibility  Acute kidney injury Due to blood loss/hypoperfusion   Alzheimer's dementia Mental status waxes and wanes -patient is  wheelchair-bound at baseline -he intermittently follows me with his eyes but does not respond to any questions -clinically appears that his quality of life is exceedingly low -I have explained very clearly to the patient's family that this is a progressive terminal illness and that it appears he is approaching the final stages of this disease -family confirms the patient has been eating very little for many weeks now and is consistently losing weight -it appears that his life expectancy is quite limited at this time  HTN  Chronic atrial fibrillation Eliquis is no longer appropriate in this patient given his life-threatening GI bleeding   Family Communication: Spoke with daughter at bedside Disposition: Psychologist, sport and exercise or [place] w/ Hospice care   Objective: Blood pressure 95/69, pulse 82, temperature 98.1 F (36.7 C), resp. rate 16, height 5\' 9"  (1.753 m), weight 74 kg, SpO2 100%.  Intake/Output Summary (Last 24 hours) at 10/05/2022 0938 Last data filed at 10/05/2022 0600 Gross per 24 hour  Intake 360 ml  Output 1850 ml  Net -1490 ml   Filed Weights   09/28/22 0306 09/28/22 1620  Weight: 74.6 kg 74 kg    Examination: General: No acute distress Lungs: Respirations unlabored  CBC: Recent Labs  Lab 10/01/22 0335 10/02/22 0204 10/03/22 0446 10/04/22 0158  WBC 10.1 10.7* 11.4* 10.5  NEUTROABS 7.7  --   --   --   HGB 7.1* 6.5* 7.5* 7.5*  HCT 22.6* 21.0* 24.0* 23.6*  MCV 83.7 84.3 83.9 83.7  PLT 151 145* 156 163   Basic Metabolic Panel: Recent Labs  Lab 09/29/22 0755  10/01/22 0335 10/03/22 0446 10/04/22 0158  NA 140 137 135 133*  K 3.8 3.3* 3.2* 3.5  CL 110 108 105 106  CO2 22 20* 18* 19*  GLUCOSE 108* 112* 111* 120*  BUN 53* 53* 52* 52*  CREATININE 1.96* 1.90* 1.87* 1.85*  CALCIUM 8.0* 7.8* 7.8* 8.0*  MG 2.3 2.2  --   --   PHOS 4.0 3.3  --   --    GFR: Estimated Creatinine Clearance: 29.2 mL/min (A) (by C-G formula based on SCr of 1.85 mg/dL (H)).    LOS: 7  days   Lonia Blood, MD [facility] Office  [phone] Pager - Text Page per Amion  If 7PM-7AM, please contact night-coverage per Amion 10/05/2022, 9:38 AM

## 2022-10-05 NOTE — Progress Notes (Signed)
[facility] Liaison Note- Hospitalized Hospice Patient    Ryan [name] is a current hospice patient followed at [facility] for terminal diagnosis Alzheimer's. Patient was taken to the ED for evaluation of complaint of dark red blood from patient's rectum for 1-2 days. [facility] was not notified or contacted by the facility or family. Received notification of patient status by Dr. [name] at [facility] ED. Patient admitted to [facility] 7.28.24 with GI Bleed. Per Dr. Kirt Boys with [facility], this is a related hospital admission.    Rounded with bedside RN who states patient is stable since yesterday.  Patient having some mild agitation, has not received medication since last PM.  Per family, patient eating between 30-40% of meals, has been drinking well.  Spoke with daughter Ryan [name] in room to answer questions.  Family is on board with patient going back to [place] with hospice services and to transition to [facility] once appropriate.  Per MD, potential for discharge as early as tomorrow.  No other needs identified at this time.   Patient remains at an inpatient level of care due to need for skilled frequent assessment and monitoring of further bleeding issues.   V/S: 98.1, 82, 16, 95/69, 100% sats on RA  I/O: 480/1850    Abnormal Labs:  RBC 2.82, Hgb 7.5, HCT 23.6, Sodium 133, CO2 19, BUN 52, Creatinine 1.85, Calcium 8.0, Glucose 120   Diagnostics:  None on 8.3.24   IV/PRN:  No IV or prn medications   EPIC MD Problem List (per Hospitalist):   Assessment & Plan:   Acute blood loss anemia -bright red blood per rectum -suspected diverticular GIB Baseline hemoglobin 11.2 - hemoglobin 7.5 on presentation- clinically most consistent with a diverticular source - status post 3 units PRBC thus far -has been dosed with IV iron - Eliquis discontinued permanently - the GI team has evaluated and stated that in general they agree the  patient appears to have a poor quality of life and would most likely benefit from being transitioned to comfort care, but at the same time has offered colonoscopy/aggressive evaluation if that is what the POA desires -I have discussed the case at length with the patient's POA as well as his 3 daughters and we all agree that further aggressive intervention is not consistent with what is appropriate for the patient or his own previously stated wishes -we are transitioning to a comfort focused approach -the patient should not be hospitalized in the future should he experience recurrent bleeding events, and the family/POA is aware that this may very well occur -we will check no further blood work and will instead focus on ensuring the patient is comfortable and without anxiety -the POA/family is interested in [facility] and therefore I have asked the hospice team to evaluate him for eligibility   Discharge Planning: Per MD, potential for discharge tomorrow. Please use [facility] for hospice patients transportation at discharge.   Goals of Care: DNR, potential for discharge to facility tomorrow.   Family Contact:  Spoke with patient's daughter, Ryan [name] at bedside after legal guardian Ryan [name] gave permission. IDT: Updated [facility] Team    Please call with any hospice related questions or concerns,   Doreatha Martin, RN [facility] Liaison [phone]

## 2022-10-06 DIAGNOSIS — K625 Hemorrhage of anus and rectum: Secondary | ICD-10-CM | POA: Diagnosis not present

## 2022-10-06 MED ORDER — ACETAMINOPHEN 325 MG PO TABS: 650.0000 mg | ORAL_TABLET | ORAL | Status: AC | PRN

## 2022-10-06 MED ORDER — PROCHLORPERAZINE MALEATE 5 MG PO TABS: 5.0000 mg | ORAL_TABLET | Freq: Four times a day (QID) | ORAL | Status: AC | PRN

## 2022-10-06 MED ORDER — LORAZEPAM 0.5 MG PO TABS: 0.2500 mg | ORAL_TABLET | ORAL | 0 refills | Status: AC | PRN

## 2022-10-06 NOTE — [place] FL2 (Signed)
[place] MEDICAID FL2 LEVEL OF CARE FORM     IDENTIFICATION  Patient Name: Ryan [name] Birthdate: 1934/04/24 Sex: male Admission Date (Current Location): 09/28/2022  [facility] and [place] Number:  Producer, television/film/video and Address:  The . [facility], [address], [place], [place] [address]      Provider Number: [id number]  Attending Physician Name and Address:  Lonia Blood, MD  Relative Name and Phone Number:  Catalina Antigua (Legal Guardian)  [phone]    Current Level of Care: Hospital Recommended Level of Care: Assisted Living Facility Prior Approval Number:    Date Approved/Denied:   PASRR Number:    Discharge Plan: Other (Comment) (Assisted Living Facility)    Current Diagnoses: Patient Active Problem List   Diagnosis Date Noted   Rectal bleeding 09/28/2022   Urinary tract infection due to ESBL Klebsiella 07/21/2021   Constipation 07/20/2021   Goals of care, counseling/discussion 07/19/2021   Acute metabolic encephalopathy 07/18/2021   GERD without esophagitis 07/18/2021   Acute cystitis without hematuria 07/18/2021   TIA (transient ischemic attack) 01/15/2021   GI bleeding 12/26/2020   Prolonged QT interval 12/26/2020   PAF (paroxysmal atrial fibrillation) (HCC) 02/15/2020   Alzheimer's dementia without behavioral disturbance (HCC) 02/15/2020   GI bleed 01/31/2020   Lower GI bleed    Essential hypertension    Acute GI bleeding 01/30/2020   Mixed hyperlipidemia 01/30/2020   BPH (benign prostatic hyperplasia) 01/30/2020   Anemia due to acute blood loss 01/30/2020   Atrial fibrillation, chronic (HCC) 01/30/2020    Orientation RESPIRATION BLADDER Height & Weight     Self  Normal Incontinent Weight: 163 lb 2.3 oz (74 kg) Height:  5\' 9"  (175.3 cm)  BEHAVIORAL SYMPTOMS/MOOD NEUROLOGICAL BOWEL NUTRITION STATUS      Incontinent Diet (comfort)  AMBULATORY STATUS COMMUNICATION OF NEEDS Skin   Total Care Verbally Normal                        Personal Care Assistance Level of Assistance  Bathing, Dressing, Feeding Bathing Assistance: Maximum assistance Feeding assistance: Maximum assistance Dressing Assistance: Maximum assistance     Functional Limitations Info  Sight, Hearing, Speech Sight Info: Adequate Hearing Info: Adequate Speech Info: Adequate    SPECIAL CARE FACTORS FREQUENCY                       Contractures Contractures Info: Not present    Additional Factors Info  Code Status, Allergies, Psychotropic Code Status Info: DNR Allergies Info: No Known Allergies Psychotropic Info: Ativan .25mg          Current Medications (10/06/2022):  This is the current hospital active medication list Current Facility-Administered Medications  Medication Dose Route Frequency Provider Last Rate Last Admin   acetaminophen (TYLENOL) tablet 650 mg  650 mg Oral Q6H PRN Crosley, Debby, MD       LORazepam (ATIVAN) tablet 0.25 mg  0.25 mg Oral BID PRN Zigmund Daniel., MD   0.25 mg at 10/04/22 2145   prochlorperazine (COMPAZINE) tablet 5 mg  5 mg Oral Q6H PRN Lonia Blood, MD         Discharge Medications: acetaminophen 325 MG tablet Commonly known as: Tylenol Take 2 tablets (650 mg total) by mouth every 4 (four) hours as needed for mild pain, fever or headache. What changed:  when to take this reasons to take this    LORazepam 0.5 MG tablet Commonly known  as: ATIVAN Take 0.5-1 tablets (0.25-0.5 mg total) by mouth every 4 (four) hours as needed for anxiety. What changed:  how much to take when to take this    prochlorperazine 5 MG tablet Commonly known as: COMPAZINE Take 1 tablet (5 mg total) by mouth every 6 (six) hours as needed for nausea or vomiting.           Relevant Imaging Results:  Relevant Lab Results:   Additional Information SS#: [id number]  Catalina Pizza Zian Mohamed, LCSW

## 2022-10-06 NOTE — TOC Transition Note (Signed)
Transition of Care [facility]) - CM/SW Discharge Note   Patient Details  Name: Ryan [name] MRN: [id number] Date of Birth: Apr 13, 1934  Transition of Care [facility]) CM/SW Contact:  Tom-Johnson, Hershal Coria, RN Phone Number: 10/06/2022, 2:03 PM   Clinical Narrative:     Patient is scheduled for discharge to day to [facility] with continued Hospice services through [employer]. [facility] scheduled for transportation.  No Further TOC needs noted.          Final next level of care: Assisted Living Barriers to Discharge: Barriers Resolved   Patient Goals and CMS Choice CMS Medicare.gov Compare Post Acute Care list provided to:: Patient Choice offered to / list presented to : Patient, Adult Children  Discharge Placement                  Patient to be transferred to facility by: [facility]      Discharge Plan and Services Additional resources added to the After Visit Summary for                  DME Arranged: N/A DME Agency: NA       HH Arranged: NA HH Agency: NA        Social Determinants of Health (SDOH) Interventions SDOH Screenings   Food Insecurity: No Food Insecurity (09/28/2022)  Housing: Low Risk  (09/28/2022)  Transportation Needs: No Transportation Needs (09/28/2022)  Utilities: Not At Risk (09/28/2022)  Tobacco Use: Low Risk  (09/28/2022)     Readmission Risk Interventions    10/06/2022    2:00 PM 02/06/2020   12:47 PM  Readmission Risk Prevention Plan  Post Dischage Appt  Complete  Medication Screening  Complete  Transportation Screening Complete Complete  PCP or Specialist Appt within 5-7 Days Complete   Home Care Screening Complete   Medication Review (RN CM) Referral to Pharmacy

## 2022-10-06 NOTE — Progress Notes (Signed)
[facility] Liaison Note- Hospitalized Hospice Patient    Ryan [name] is a current hospice patient followed at [facility] for terminal diagnosis Alzheimer's. Patient was taken to the ED for evaluation of complaint of dark red blood from patient's rectum for 1-2 days. [facility] was not notified or contacted by the facility or family. Received notification of patient status by Dr. [name] at [facility] ED. Patient admitted to [facility] 7.28.24 with GI Bleed. Per Dr. Kirt Boys with [facility], this is a related hospital admission.    Checked in with the bedside RN who states that the patient has been stable this shift with an uneventful evening last night with a possibility of discharge this afternoon.  Visited patient in room with no visitors at side, who was resting peacefully in bed, opened eyes to verbal stimuli denying complaints at this time. Breakfast tray in room was 30% gone. Reached out to patient's daughter Ryan [name] to support and she stated that family is on board with patient going back to [facility] with hospice services when medically ready with a possibility to transition to [facility] once appropriate.   No other needs identified at this time.   Patient remains at an inpatient level of care due to need for skilled frequent assessment and monitoring of further bleeding issues.   V/S: 98.5, 88, 18, 103/57, 94% sats on RA I/O: 760/2050 (-1290)   Abnormal Labs: No new labs (on Comfort Care) Labs from 10/04/22: RBC 2.82, Hgb 7.5, HCT 23.6, Sodium 133, CO2 19, BUN 52, Creatinine 1.85, Calcium 8.0, Glucose 120   Diagnostics:  None on 8.5.24   IV/PRN: No IV or prn medications   EPIC MD Problem List (per Hospitalist):   Acute blood loss anemia -bright red blood per rectum -suspected diverticular GIB Baseline hemoglobin 11.2 - hemoglobin 7.5 on presentation- clinically most consistent with a diverticular source - status post  3 units PRBC during the initial portion of this hospitalization - was dosed with IV iron during the initial portion of this hospitalization - Eliquis discontinued permanently - the GI team evaluated the patient and stated that in general they agree the patient would most likely benefit from being transitioned to comfort care, but at the same time offered colonoscopy/aggressive evaluation if that is what the POA desired - I discussed the case at length with the patient's POA as well as his 3 daughters and we all agreed that further aggressive intervention is not appropriate for this patient who is suffering with a progressive terminal illness, nor is it consistent with his own previously stated wishes - we transitioned to a comfort focused approach - the patient should not be hospitalized in the future should he experience recurrent bleeding events, and the family/POA is aware that this may very well occur - we will check no further blood work and will instead focus on ensuring the patient is comfortable and without anxiety - the POA/family is interested in [facility] should he precipitously decline, but for now the patient will be transitioned back to [facility] with ongoing hospice care at the facility   Acute kidney injury: Due to blood loss/hypoperfusion    Alzheimer's dementia: Mental status waxes and wanes -patient is wheelchair-bound at baseline -he intermittently followed me with his eyes but did not respond to any questions - clinically it ws clear that his quality of life is exceedingly low, and actively declining  - I explained very clearly to the patient's family and POA  that this is a progressive terminal illness and that it appears he is approaching the final stages of this disease - family confirms the patient has been eating very little for many weeks and is consistently losing weight - it appears that his life expectancy is quite limited at this time - comfort care is the most appropriate  intervention, and is consistent with the plan of care established by his previously completed MOST form - Hospice to continue to follow the patient at this facility    Chronic atrial fibrillation: Eliquis is no longer appropriate in this patient given his life-threatening GI bleeding     Discharge Planning: Plan is for patient to discharge back to [facility] today on hospice services - [facility] MD has posted the discharge summary in EPIC. Please use [facility] for hospice patients transportation at discharge.   Goals of Care: DNR, Patient is on Comfort Care at hospital and family has confirmed interest in continuing hospice services upon discharge.   Family Contact:  Spoke with patient's daughter, Ryan [name] to support. IDT: Updated [facility] Team    Please call with any hospice related questions or concerns,   Roda Shutters, RN [facility] Liaison [phone]

## 2022-10-06 NOTE — Discharge Summary (Signed)
DISCHARGE SUMMARY  Ryan [name]  MR#: [id number]  DOB:Mar 17, 1934  Date of Admission: 09/28/2022 Date of Discharge: 10/06/2022  Attending Physician:Skyelynn Rambeau Silvestre Gunner, MD  Patient's ONG:EXBMWUX, No Pcp Per  Consults: Hospice   Disposition: D/C to SNF for comfort focused care   Follow-up Appts:  Follow-up Information     [facility] Follow up.   Specialty: Hospice and Palliative Medicine Why: The Hospice team will continue to care for you at your SNF facility. Contact information: [address] [facility] [place] [address] [phone]                Discharge Diagnoses: Acute blood loss anemia Suspected diverticular GIB Acute kidney injury Advanced terminal stage Alzheimer's dementia  HTN Chronic atrial fibrillation  NCB/DNR DO NOT HOSPITALIZE  Initial presentation: 88 year old SNF resident on hospice care with a history of dementia, chronic atrial fibrillation on Eliquis, GIB, and BPH who presented to the ER 7/28 with BRBPR.   Hospital Course:  Acute blood loss anemia -bright red blood per rectum -suspected diverticular GIB Baseline hemoglobin 11.2 - hemoglobin 7.5 on presentation- clinically most consistent with a diverticular source - status post 3 units PRBC during the initial portion of this hospitalization - was dosed with IV iron during the initial portion of this hospitalization - Eliquis discontinued permanently - the GI team evaluated the patient and stated that in general they agree the patient would most likely benefit from being transitioned to comfort care, but at the same time offered colonoscopy/aggressive evaluation if that is what the POA desired - I discussed the case at length with the patient's POA as well as his 3 daughters and we all agreed that further aggressive intervention is not appropriate for this patient who is suffering with a progressive terminal illness, nor is it consistent with his own previously stated wishes - we  transitioned to a comfort focused approach - the patient should not be hospitalized in the future should he experience recurrent bleeding events, and the family/POA is aware that this may very well occur - we will check no further blood work and will instead focus on ensuring the patient is comfortable and without anxiety - the POA/family is interested in [facility] should he precipitously decline, but for now the patient will be transitioned back to [facility] with ongoing hospice care at the facility   Acute kidney injury Due to blood loss/hypoperfusion    Alzheimer's dementia Mental status waxes and wanes -patient is wheelchair-bound at baseline -he intermittently followed me with his eyes but did not respond to any questions - clinically it ws clear that his quality of life is exceedingly low, and actively declining  - I explained very clearly to the patient's family and POA that this is a progressive terminal illness and that it appears he is approaching the final stages of this disease - family confirms the patient has been eating very little for many weeks and is consistently losing weight - it appears that his life expectancy is quite limited at this time - comfort care is the most appropriate intervention, and is consistent with the plan of care established by his previously completed MOST form - Hospice to continue to follow the patient at this facility    Chronic atrial fibrillation Eliquis is no longer appropriate in this patient given his life-threatening GI bleeding     Allergies as of 10/06/2022   No Known Allergies      Medication List     STOP taking  these medications    BAZA PROTECT EX   furosemide 40 MG tablet Commonly known as: LASIX   guaifenesin 100 MG/5ML syrup Commonly known as: ROBITUSSIN   Lubriderm Lotn   metoprolol tartrate 25 MG tablet Commonly known as: LOPRESSOR   Olopatadine HCl 0.2 % Soln   polyethylene glycol 17 g packet Commonly known as:  MIRALAX / GLYCOLAX   potassium chloride 10 MEQ tablet Commonly known as: KLOR-CON   senna 8.6 MG Tabs tablet Commonly known as: SENOKOT       TAKE these medications    acetaminophen 325 MG tablet Commonly known as: Tylenol Take 2 tablets (650 mg total) by mouth every 4 (four) hours as needed for mild pain, fever or headache. What changed:  when to take this reasons to take this   LORazepam 0.5 MG tablet Commonly known as: ATIVAN Take 0.5-1 tablets (0.25-0.5 mg total) by mouth every 4 (four) hours as needed for anxiety. What changed:  how much to take when to take this   prochlorperazine 5 MG tablet Commonly known as: COMPAZINE Take 1 tablet (5 mg total) by mouth every 6 (six) hours as needed for nausea or vomiting.        Day of Discharge BP (!) 103/57 (BP Location: Left Arm)   Pulse 88   Temp 98.5 F (36.9 C) (Oral)   Resp 18   Ht 5\' 9"  (1.753 m)   Wt 74 kg   SpO2 94%   BMI 24.09 kg/m   Physical Exam: General: Resting quietly in bed with no evidence of agitation Lungs: Respirations are calm and unlabored  Basic Metabolic Panel: Recent Labs  Lab 10/01/22 0335 10/03/22 0446 10/04/22 0158  NA 137 135 133*  K 3.3* 3.2* 3.5  CL 108 105 106  CO2 20* 18* 19*  GLUCOSE 112* 111* 120*  BUN 53* 52* 52*  CREATININE 1.90* 1.87* 1.85*  CALCIUM 7.8* 7.8* 8.0*  MG 2.2  --   --   PHOS 3.3  --   --     CBC: Recent Labs  Lab 09/30/22 1509 10/01/22 0335 10/02/22 0204 10/03/22 0446 10/04/22 0158  WBC  --  10.1 10.7* 11.4* 10.5  NEUTROABS  --  7.7  --   --   --   HGB 7.2* 7.1* 6.5* 7.5* 7.5*  HCT 22.5* 22.6* 21.0* 24.0* 23.6*  MCV  --  83.7 84.3 83.9 83.7  PLT  --  151 145* 156 163    Time spent in discharge (includes decision making & examination of pt): 35 at minutes  10/06/2022, 9:41 AM   Lonia Blood, MD [facility] Office  [phone]

## 2022-10-06 NOTE — TOC Progression Note (Signed)
Transition of Care [facility]) - Initial/Assessment Note    Patient Details  Name: Ryan [name] MRN: [id number] Date of Birth: [birth year]  Transition of Care [facility]) CM/SW Contact:    Ralene Bathe, LCSW Phone Number: 10/06/2022, 12:31 PM  Clinical Narrative:                 LCSW faxed FL2 and discharge summary to facility [phone]).   Patient Goals and CMS Choice            Expected Discharge Plan and Services         Expected Discharge Date: 10/06/22                                    Prior Living Arrangements/Services                       Activities of Daily Living Home Assistive Devices/Equipment: Hospital bed, Wheelchair (At [employer]) ADL Screening (condition at time of admission) Patient's cognitive ability adequate to safely complete daily activities?: No Is the patient deaf or have difficulty hearing?: No Does the patient have difficulty seeing, even when wearing glasses/contacts?: No Does the patient have difficulty concentrating, remembering, or making decisions?: Yes Patient able to express need for assistance with ADLs?: No Does the patient have difficulty dressing or bathing?: Yes Independently performs ADLs?: No Communication: Dependent Is this a change from baseline?: Pre-admission baseline Dressing (OT): Dependent Is this a change from baseline?: Pre-admission baseline Grooming: Dependent Is this a change from baseline?: Pre-admission baseline Feeding: Dependent Is this a change from baseline?: Pre-admission baseline Bathing: Dependent Is this a change from baseline?: Pre-admission baseline Toileting: Needs assistance Is this a change from baseline?: Pre-admission baseline In/Out Bed: Needs assistance, Dependent Is this a change from baseline?: Pre-admission baseline Walks in Home: Dependent Is this a change from baseline?: Pre-admission baseline Does the patient have difficulty walking or climbing stairs?: Yes Weakness of  Legs: Both Weakness of Arms/Hands: Both  Permission Sought/Granted                  Emotional Assessment              Admission diagnosis:  Rectal bleeding [K62.5] GI bleed [K92.2] AKI (acute kidney injury) (HCC) [N17.9] Patient Active Problem List   Diagnosis Date Noted   Rectal bleeding 09/28/2022   Urinary tract infection due to ESBL Klebsiella 07/21/2021   Constipation 07/20/2021   Goals of care, counseling/discussion 07/19/2021   Acute metabolic encephalopathy 07/18/2021   GERD without esophagitis 07/18/2021   Acute cystitis without hematuria 07/18/2021   TIA (transient ischemic attack) 01/15/2021   GI bleeding 12/26/2020   Prolonged QT interval 12/26/2020   PAF (paroxysmal atrial fibrillation) (HCC) 02/15/2020   Alzheimer's dementia without behavioral disturbance (HCC) 02/15/2020   GI bleed 01/31/2020   Lower GI bleed    Essential hypertension    Acute GI bleeding 01/30/2020   Mixed hyperlipidemia 01/30/2020   BPH (benign prostatic hyperplasia) 01/30/2020   Anemia due to acute blood loss 01/30/2020   Atrial fibrillation, chronic (HCC) 01/30/2020   PCP:  Patient, No Pcp Per Pharmacy:   [id number] [place], [place] - [address] AT [facility] [address] [facility] [address] [place] [address] Phone: [phone] Fax: [phone]     Social Determinants of Health (SDOH) Social History: SDOH Screenings  Food Insecurity: No Food Insecurity (09/28/2022)  Housing: Low Risk  (09/28/2022)  Transportation Needs: No Transportation Needs (09/28/2022)  Utilities: Not At Risk (09/28/2022)  Tobacco Use: Low Risk  (09/28/2022)   SDOH Interventions: Transportation Interventions: Intervention Not Indicated, Inpatient TOC, Patient Resources (Friends/Family)   Readmission Risk Interventions    02/06/2020   12:47 PM  Readmission Risk Prevention Plan  Post Dischage Appt Complete  Medication Screening Complete  Transportation Screening  Complete
